# Patient Record
Sex: Female | Born: 1950 | Race: White | Hispanic: No | Marital: Married | Smoking: Never smoker
Health system: Southern US, Community
[De-identification: ages and names within clinical notes are randomized; demographics above are authoritative.]

## PROBLEM LIST (undated history)

## (undated) DIAGNOSIS — Z98811 Dental restoration status: Secondary | ICD-10-CM

## (undated) DIAGNOSIS — K259 Gastric ulcer, unspecified as acute or chronic, without hemorrhage or perforation: Secondary | ICD-10-CM

## (undated) DIAGNOSIS — N6092 Unspecified benign mammary dysplasia of left breast: Secondary | ICD-10-CM

## (undated) DIAGNOSIS — Z972 Presence of dental prosthetic device (complete) (partial): Secondary | ICD-10-CM

## (undated) DIAGNOSIS — Z8679 Personal history of other diseases of the circulatory system: Secondary | ICD-10-CM

## (undated) DIAGNOSIS — E039 Hypothyroidism, unspecified: Secondary | ICD-10-CM

## (undated) DIAGNOSIS — K2270 Barrett's esophagus without dysplasia: Secondary | ICD-10-CM

## (undated) DIAGNOSIS — Z923 Personal history of irradiation: Secondary | ICD-10-CM

## (undated) DIAGNOSIS — C50911 Malignant neoplasm of unspecified site of right female breast: Secondary | ICD-10-CM

## (undated) DIAGNOSIS — Z9889 Other specified postprocedural states: Secondary | ICD-10-CM

## (undated) DIAGNOSIS — R112 Nausea with vomiting, unspecified: Secondary | ICD-10-CM

## (undated) DIAGNOSIS — G250 Essential tremor: Secondary | ICD-10-CM

## (undated) DIAGNOSIS — K219 Gastro-esophageal reflux disease without esophagitis: Secondary | ICD-10-CM

## (undated) DIAGNOSIS — M810 Age-related osteoporosis without current pathological fracture: Secondary | ICD-10-CM

## (undated) DIAGNOSIS — T39395A Adverse effect of other nonsteroidal anti-inflammatory drugs [NSAID], initial encounter: Secondary | ICD-10-CM

## (undated) DIAGNOSIS — Z86718 Personal history of other venous thrombosis and embolism: Secondary | ICD-10-CM

## (undated) DIAGNOSIS — E785 Hyperlipidemia, unspecified: Secondary | ICD-10-CM

## (undated) DIAGNOSIS — I499 Cardiac arrhythmia, unspecified: Secondary | ICD-10-CM

## (undated) HISTORY — DX: Gastro-esophageal reflux disease without esophagitis: K21.9

## (undated) HISTORY — DX: Age-related osteoporosis without current pathological fracture: M81.0

## (undated) HISTORY — DX: Hyperlipidemia, unspecified: E78.5

## (undated) HISTORY — DX: Personal history of other diseases of the circulatory system: Z86.79

## (undated) HISTORY — DX: Barrett's esophagus without dysplasia: K22.70

## (undated) HISTORY — PX: COLONOSCOPY: SHX174

---

## 1989-07-18 HISTORY — PX: BREAST BIOPSY: SHX20

## 1989-07-18 HISTORY — PX: BREAST LUMPECTOMY: SHX2

## 2004-01-08 ENCOUNTER — Encounter: Admission: RE | Admit: 2004-01-08 | Discharge: 2004-01-08 | Payer: Self-pay | Admitting: Obstetrics and Gynecology

## 2005-01-11 ENCOUNTER — Encounter: Admission: RE | Admit: 2005-01-11 | Discharge: 2005-01-11 | Payer: Self-pay | Admitting: Obstetrics and Gynecology

## 2006-01-13 ENCOUNTER — Encounter: Admission: RE | Admit: 2006-01-13 | Discharge: 2006-01-13 | Payer: Self-pay | Admitting: Obstetrics and Gynecology

## 2006-08-18 ENCOUNTER — Ambulatory Visit: Payer: Self-pay | Admitting: Internal Medicine

## 2006-08-18 LAB — CONVERTED CEMR LAB
ALT: 30 units/L (ref 0–40)
BUN: 14 mg/dL (ref 6–23)
Basophils Relative: 0.6 % (ref 0.0–1.0)
Bilirubin, Direct: 0.1 mg/dL (ref 0.0–0.3)
CO2: 30 meq/L (ref 19–32)
Calcium, Total (PTH): 9.8 mg/dL (ref 8.4–10.5)
Cholesterol: 203 mg/dL (ref 0–200)
Direct LDL: 138.9 mg/dL
Eosinophils Relative: 2.7 % (ref 0.0–5.0)
GFR calc Af Amer: 95 mL/min
Hemoglobin: 13.6 g/dL (ref 12.0–15.0)
MCHC: 34.3 g/dL (ref 30.0–36.0)
Monocytes Absolute: 0.5 10*3/uL (ref 0.2–0.7)
Neutro Abs: 4.8 10*3/uL (ref 1.4–7.7)
PTH: 15.2 pg/mL (ref 14.0–72.0)
Potassium: 4.8 meq/L (ref 3.5–5.1)
RBC: 4.43 M/uL (ref 3.87–5.11)
RDW: 12.4 % (ref 11.5–14.6)
Sodium: 142 meq/L (ref 135–145)
TSH: 0.48 microintl units/mL (ref 0.35–5.50)
Total Bilirubin: 0.5 mg/dL (ref 0.3–1.2)
Triglycerides: 146 mg/dL (ref 0–149)

## 2006-09-01 ENCOUNTER — Ambulatory Visit: Payer: Self-pay | Admitting: Internal Medicine

## 2006-11-08 ENCOUNTER — Ambulatory Visit: Payer: Self-pay | Admitting: Internal Medicine

## 2006-11-08 LAB — CONVERTED CEMR LAB
Glucose, Bld: 103 mg/dL — ABNORMAL HIGH (ref 70–99)
HDL: 47.4 mg/dL (ref >39.0)
LDL Cholesterol: 72 mg/dL (ref 0–99)
Triglycerides: 143 mg/dL (ref 0–149)
VLDL: 29 mg/dL (ref 0–40)

## 2006-11-15 ENCOUNTER — Ambulatory Visit: Payer: Self-pay | Admitting: Internal Medicine

## 2007-04-24 DIAGNOSIS — E785 Hyperlipidemia, unspecified: Secondary | ICD-10-CM | POA: Insufficient documentation

## 2007-04-24 DIAGNOSIS — M899 Disorder of bone, unspecified: Secondary | ICD-10-CM | POA: Insufficient documentation

## 2007-04-24 DIAGNOSIS — M949 Disorder of cartilage, unspecified: Secondary | ICD-10-CM

## 2007-04-24 DIAGNOSIS — K219 Gastro-esophageal reflux disease without esophagitis: Secondary | ICD-10-CM | POA: Insufficient documentation

## 2007-04-24 DIAGNOSIS — Z86718 Personal history of other venous thrombosis and embolism: Secondary | ICD-10-CM

## 2007-05-08 ENCOUNTER — Encounter: Admission: RE | Admit: 2007-05-08 | Discharge: 2007-05-08 | Payer: Self-pay | Admitting: Obstetrics

## 2007-07-19 DIAGNOSIS — K2270 Barrett's esophagus without dysplasia: Secondary | ICD-10-CM

## 2007-07-19 HISTORY — DX: Barrett's esophagus without dysplasia: K22.70

## 2007-07-19 HISTORY — PX: COLOSTOMY: SHX63

## 2007-11-20 ENCOUNTER — Encounter: Payer: Self-pay | Admitting: Internal Medicine

## 2008-04-02 ENCOUNTER — Telehealth: Payer: Self-pay | Admitting: *Deleted

## 2008-05-09 ENCOUNTER — Encounter: Admission: RE | Admit: 2008-05-09 | Discharge: 2008-05-09 | Payer: Self-pay | Admitting: Internal Medicine

## 2008-05-09 ENCOUNTER — Telehealth: Payer: Self-pay | Admitting: *Deleted

## 2008-05-09 LAB — HM MAMMOGRAPHY

## 2008-05-12 ENCOUNTER — Ambulatory Visit: Payer: Self-pay | Admitting: Internal Medicine

## 2008-05-12 DIAGNOSIS — E039 Hypothyroidism, unspecified: Secondary | ICD-10-CM | POA: Insufficient documentation

## 2008-05-12 DIAGNOSIS — T50995A Adverse effect of other drugs, medicaments and biological substances, initial encounter: Secondary | ICD-10-CM | POA: Insufficient documentation

## 2008-05-13 ENCOUNTER — Ambulatory Visit: Admission: RE | Admit: 2008-05-13 | Discharge: 2008-05-13 | Payer: Self-pay | Admitting: Internal Medicine

## 2008-05-13 ENCOUNTER — Ambulatory Visit: Payer: Self-pay | Admitting: Vascular Surgery

## 2008-05-13 ENCOUNTER — Encounter: Payer: Self-pay | Admitting: Internal Medicine

## 2008-05-21 ENCOUNTER — Encounter: Payer: Self-pay | Admitting: Internal Medicine

## 2008-05-29 ENCOUNTER — Encounter: Payer: Self-pay | Admitting: Internal Medicine

## 2008-06-18 ENCOUNTER — Telehealth: Payer: Self-pay | Admitting: Internal Medicine

## 2008-06-21 DIAGNOSIS — K2970 Gastritis, unspecified, without bleeding: Secondary | ICD-10-CM

## 2008-06-21 DIAGNOSIS — K209 Esophagitis, unspecified without bleeding: Secondary | ICD-10-CM | POA: Insufficient documentation

## 2008-06-21 DIAGNOSIS — K2990 Gastroduodenitis, unspecified, without bleeding: Secondary | ICD-10-CM

## 2008-08-15 ENCOUNTER — Telehealth: Payer: Self-pay | Admitting: *Deleted

## 2008-12-09 ENCOUNTER — Telehealth: Payer: Self-pay | Admitting: Internal Medicine

## 2008-12-10 ENCOUNTER — Ambulatory Visit: Payer: Self-pay | Admitting: Internal Medicine

## 2008-12-10 DIAGNOSIS — J019 Acute sinusitis, unspecified: Secondary | ICD-10-CM

## 2008-12-10 DIAGNOSIS — B009 Herpesviral infection, unspecified: Secondary | ICD-10-CM | POA: Insufficient documentation

## 2008-12-10 DIAGNOSIS — H6980 Other specified disorders of Eustachian tube, unspecified ear: Secondary | ICD-10-CM

## 2009-06-01 ENCOUNTER — Ambulatory Visit: Payer: Self-pay | Admitting: Internal Medicine

## 2009-06-01 DIAGNOSIS — R0789 Other chest pain: Secondary | ICD-10-CM | POA: Insufficient documentation

## 2009-06-01 DIAGNOSIS — R5381 Other malaise: Secondary | ICD-10-CM

## 2009-06-01 DIAGNOSIS — R5383 Other fatigue: Secondary | ICD-10-CM

## 2009-06-09 ENCOUNTER — Telehealth: Payer: Self-pay | Admitting: Internal Medicine

## 2009-06-09 ENCOUNTER — Ambulatory Visit: Payer: Self-pay | Admitting: Internal Medicine

## 2009-06-15 LAB — CONVERTED CEMR LAB
ALT: 31 units/L (ref 0–35)
Albumin: 4.1 g/dL (ref 3.5–5.2)
Basophils Absolute: 0 10*3/uL (ref 0.0–0.1)
Basophils Relative: 0.5 % (ref 0.0–3.0)
CO2: 24 meq/L (ref 19–32)
Chloride: 109 meq/L (ref 96–112)
Cholesterol: 196 mg/dL (ref 0–200)
Eosinophils Relative: 6.9 % — ABNORMAL HIGH (ref 0.0–5.0)
Glucose, Bld: 105 mg/dL — ABNORMAL HIGH (ref 70–99)
HCT: 37.7 % (ref 36.0–46.0)
HDL: 40.4 mg/dL (ref >39.00)
LDL Cholesterol: 131 mg/dL — ABNORMAL HIGH (ref 0–99)
MCHC: 34.1 g/dL (ref 30.0–36.0)
Monocytes Relative: 7.5 % (ref 3.0–12.0)
Potassium: 4.5 meq/L (ref 3.5–5.1)
RBC: 4.03 M/uL (ref 3.87–5.11)
Sodium: 142 meq/L (ref 135–145)
Total Bilirubin: 1.5 mg/dL — ABNORMAL HIGH (ref 0.3–1.2)
Triglycerides: 122 mg/dL (ref 0.0–149.0)
VLDL: 24.4 mg/dL (ref 0.0–40.0)

## 2009-06-30 ENCOUNTER — Encounter: Payer: Self-pay | Admitting: Internal Medicine

## 2009-08-11 ENCOUNTER — Encounter: Admission: RE | Admit: 2009-08-11 | Discharge: 2009-08-11 | Payer: Self-pay | Admitting: Obstetrics

## 2010-06-17 ENCOUNTER — Telehealth: Payer: Self-pay | Admitting: Internal Medicine

## 2010-06-17 ENCOUNTER — Encounter: Payer: Self-pay | Admitting: Internal Medicine

## 2010-06-24 ENCOUNTER — Ambulatory Visit: Payer: Self-pay | Admitting: Internal Medicine

## 2010-06-24 ENCOUNTER — Encounter: Payer: Self-pay | Admitting: Internal Medicine

## 2010-06-24 DIAGNOSIS — R634 Abnormal weight loss: Secondary | ICD-10-CM

## 2010-06-24 DIAGNOSIS — L570 Actinic keratosis: Secondary | ICD-10-CM | POA: Insufficient documentation

## 2010-06-24 DIAGNOSIS — R9431 Abnormal electrocardiogram [ECG] [EKG]: Secondary | ICD-10-CM

## 2010-06-24 DIAGNOSIS — D485 Neoplasm of uncertain behavior of skin: Secondary | ICD-10-CM

## 2010-06-28 ENCOUNTER — Encounter: Payer: Self-pay | Admitting: *Deleted

## 2010-06-28 ENCOUNTER — Encounter: Payer: Self-pay | Admitting: Internal Medicine

## 2010-06-28 LAB — CONVERTED CEMR LAB
ALT: 18 units/L (ref 0–35)
AST: 21 units/L (ref 0–37)
Albumin: 4.5 g/dL (ref 3.5–5.2)
Alkaline Phosphatase: 52 units/L (ref 39–117)
Basophils Absolute: 0 10*3/uL (ref 0.0–0.1)
Basophils Relative: 0.9 % (ref 0.0–3.0)
Bilirubin, Direct: 0.1 mg/dL (ref 0.0–0.3)
CO2: 27 meq/L (ref 19–32)
Calcium: 9.6 mg/dL (ref 8.4–10.5)
Folate: 17.1 ng/mL
HCT: 39.5 % (ref 36.0–46.0)
HDL: 51.2 mg/dL (ref >39.00)
Lymphs Abs: 1.4 10*3/uL (ref 0.7–4.0)
Monocytes Relative: 7.8 % (ref 3.0–12.0)
Neutrophils Relative %: 57.2 % (ref 43.0–77.0)
RDW: 13 % (ref 11.5–14.6)
Sodium: 141 meq/L (ref 135–145)
Total CHOL/HDL Ratio: 4
Total Protein: 7.2 g/dL (ref 6.0–8.3)
VLDL: 16.2 mg/dL (ref 0.0–40.0)
WBC: 4.6 10*3/uL (ref 4.5–10.5)

## 2010-07-16 ENCOUNTER — Ambulatory Visit: Payer: Self-pay | Admitting: Internal Medicine

## 2010-07-16 ENCOUNTER — Encounter: Payer: Self-pay | Admitting: Internal Medicine

## 2010-07-16 DIAGNOSIS — R0989 Other specified symptoms and signs involving the circulatory and respiratory systems: Secondary | ICD-10-CM | POA: Insufficient documentation

## 2010-07-29 ENCOUNTER — Telehealth (INDEPENDENT_AMBULATORY_CARE_PROVIDER_SITE_OTHER): Payer: Self-pay | Admitting: *Deleted

## 2010-07-30 ENCOUNTER — Ambulatory Visit: Admission: RE | Admit: 2010-07-30 | Discharge: 2010-07-30 | Payer: Self-pay | Source: Home / Self Care

## 2010-07-30 ENCOUNTER — Encounter: Payer: Self-pay | Admitting: Internal Medicine

## 2010-07-30 ENCOUNTER — Other Ambulatory Visit: Payer: Self-pay | Admitting: Internal Medicine

## 2010-07-30 ENCOUNTER — Ambulatory Visit (HOSPITAL_COMMUNITY)
Admission: RE | Admit: 2010-07-30 | Discharge: 2010-07-30 | Payer: Self-pay | Source: Home / Self Care | Attending: Internal Medicine | Admitting: Internal Medicine

## 2010-08-05 ENCOUNTER — Telehealth: Payer: Self-pay | Admitting: Internal Medicine

## 2010-08-06 ENCOUNTER — Telehealth: Payer: Self-pay | Admitting: Internal Medicine

## 2010-08-15 LAB — CONVERTED CEMR LAB
Albumin: 4.2 g/dL (ref 3.5–5.2)
Alkaline Phosphatase: 44 units/L (ref 39–117)
Basophils Absolute: 0.1 10*3/uL (ref 0.0–0.1)
Bilirubin, Direct: 0.1 mg/dL (ref 0.0–0.3)
CO2: 27 meq/L (ref 19–32)
Cholesterol: 206 mg/dL (ref 0–200)
Creatinine, Ser: 0.8 mg/dL (ref 0.4–1.2)
GFR calc Af Amer: 95 mL/min
Hemoglobin: 13.2 g/dL (ref 12.0–15.0)
MCHC: 34.1 g/dL (ref 30.0–36.0)
MCV: 91.1 fL (ref 78.0–100.0)
Monocytes Absolute: 0.4 10*3/uL (ref 0.1–1.0)
Monocytes Relative: 7.6 % (ref 3.0–12.0)
Neutro Abs: 2.6 10*3/uL (ref 1.4–7.7)
Neutrophils Relative %: 50.7 % (ref 43.0–77.0)
Platelets: 269 10*3/uL (ref 150–400)
Potassium: 4.2 meq/L (ref 3.5–5.1)
RBC: 4.24 M/uL (ref 3.87–5.11)
Sodium: 144 meq/L (ref 135–145)
TSH: 0.65 microintl units/mL (ref 0.35–5.50)
VLDL: 25 mg/dL (ref 0–40)

## 2010-08-18 NOTE — Letter (Signed)
Summary: [facility]   Imported By: Maryln Gottron 08/15/2008 12:38:47  _____________________________________________________________________  External Attachment:    Type:   Image     Comment:   External Document

## 2010-08-18 NOTE — Progress Notes (Signed)
Summary: abnormal ekg  Phone Note Call from Patient   Caller: Patient Call For: Donna Headings MD Summary of Call: [phone] Pt had endoscopy yesterday and was told her EKG was abnormal, and needed to see her primary care MD.  Pt would like an appt for this problem and also for her labs and CPX.  Ekg is being faxed to Dr. Fabian Sharp, and pt has a copy of it.   Please advise when she should be seen. Initial call taken by: Lynann Beaver CMA AAMA,  June 17, 2010 4:18 PM  Follow-up for Phone Call        called pt to let her know we got her call - will let Dr. Ivin Poot know ekg was abnormal during procedure and that she was placed on meds for ulcer - carafate and a ?PPI , also requesting labs be done before cpx ?or at appt? , worried that the carafate will increase bloos sugars -(side effect listed) . Please advise  next wk what to do.  Follow-up by: Duard Brady LPN,  June 18, 2010 5:50 PM  Additional Follow-up for Phone Call Additional follow up Details #1::        I dont see ekg yet  ... please check to see  if  here.   Suggest ov to discuss  all of the above when    EKG is available to decide further  evaluation Additional Follow-up by: Donna Headings MD,  June 21, 2010 6:21 PM    Additional Follow-up for Phone Call Additional follow up Details #2::    LMTOCB Follow-up by: Romualdo Bolk, CMA Duncan Dull),  June 22, 2010 4:29 PM  Additional Follow-up for Phone Call Additional follow up Details #3:: Details for Additional Follow-up Action Taken: Pt aware and is going to bring in a copy of the EKG in then schedule a follow up appt. Romualdo Bolk, CMA (AAMA)  June 23, 2010 10:57 AM   ekg show nonspecific T waves.  has appt for tomorrow. Donna Headings MD  June 23, 2010 5:43 PM

## 2010-08-18 NOTE — Consult Note (Signed)
Summary: [facility]   Imported By: Maryln Gottron 12/04/2009 11:15:56  _____________________________________________________________________  External Attachment:    Type:   Image     Comment:   External Document

## 2010-08-19 NOTE — Assessment & Plan Note (Signed)
Summary: np6/abn ekg/jml   Visit Type:  Initial Consult Primary Provider:  Madelin Headings MD  CC:  Abnormal EKG.  History of Present Illness: Patient is a 59 year od who was referred for evaluation of chest pain and an abnormal EKG Patient has no history of CAD.   She has had episodes of chest pain that occur with and without acitivty. Episodes last seconds.  None recently.  She also notices more fatigue recently.  Some mild wheezing in November.  Current Medications (verified): 1)  Centrum  Tabs (Multiple Vitamins-Minerals) .... Take 1 Tablet By Mouth Once A Day 2)  Fish Oil 500 Mg Caps (Omega-3 Fatty Acids) .... Take 1 Capsule By Mouth Two Times A Day 3)  Omeprazole 40 Mg Cpdr (Omeprazole) .... Take 1 Capsule By Mouth Once A Day 4)  Synthroid 100 Mcg Tabs (Levothyroxine Sodium) .... Take 1 Tablet By Mouth Once A Week 5)  Synthroid 88 Mcg Tabs (Levothyroxine Sodium) .Marland Kitchen.. 1 Tablet By Mouth Once A Day or As Directed ( 6 Days A Week) 6)  Zovirax 5 % Crea (Acyclovir) .... Apply To Col Sore Every 2-3 Hours At Onset or As Directed 7)  Carafate 1 Gm/48ml Susp (Sucralfate) .... 2 Teasp. Two Times A Day  Allergies: 1)  ! Iodine (Iodine) 2)  ! * Latex  Past History:  Past Medical History: Last updated: 06/01/2009 G4P5  DVT, hx of Hyperlipidemia Osteopenia Blood in Stool GERD  Barretts on egd 2009 Allergies Phlebitis  ? DVT when pregnant  Thyroid Problem [facility]  gyne  fogelman     Past Surgical History: Last updated: 05/12/2008 Breast Bx benighn  Family History: Last updated: 07/16/2010 Family History of Arthritis Family History Diabetes 1st degree relative Family History High cholesterol Family History Hypertension Family History of Stroke M 1st degree relative <50 Family History Thyroid disease sister  Family History of Neurological disorder Mother died of CHF  Age 26 Father died of Parkinsons 80.  Social History: Last updated: 05/12/2008 Married Never  Smoked Alcohol use-no Drug use-no Regular exercise-no had been  HH of 2  [place] to [employer]  Family History: Family History of Arthritis Family History Diabetes 1st degree relative Family History High cholesterol Family History Hypertension Family History of Stroke M 1st degree relative <50 Family History Thyroid disease sister  Family History of Neurological disorder Mother died of CHF  Age 76 Father died of Parkinsons 36.  Review of Systems       All systems reviewed.  Neg to the above problem except as noted above.  Note LDL was 146, HDL was 51.  Vital Signs:  Patient profile:   60 year old female Menstrual status:  postmenopausal Height:      67.5 inches Weight:      186.25 pounds BMI:     28.84 Pulse rate:   72 / minute Pulse rhythm:   regular Resp:     18 per minute BP sitting:   118 / 80  (left arm) Cuff size:   large  Vitals Entered By: Vikki Ports (July 16, 2010 10:59 AM)  Physical Exam  Additional Exam:  Patient is in NAD HEENT:  Normocephalic, atraumatic. EOMI, PERRLA.  Neck: JVP is normal. No thyromegaly. Question bruit. R Lungs: clear to auscultation. No rales no wheezes.  Heart: Regular rate and rhythm. Normal S1, S2. No S3.   No significant murmurs. PMI not displaced.  Abdomen:  Supple, nontender. Normal bowel sounds. No masses. No hepatomegaly.  Extremities:  Good distal pulses throughout. No lower extremity edema.  Musculoskeletal :moving all extremities.  Neuro:   alert and oriented x3.    EKG  Procedure date:  07/16/2010  Findings:      NSR.  72 bpm.   T wave inv V1-V4, III.  Sl sagging of ST seg inferolaterally.  Impression & Recommendations:  Problem # 1:  NONSPECIFIC ABNORMAL ELECTROCARDIOGRAM (ICD-794.31) Patient's EKG findings are not specific.  I am not convinced Cp is angina.  More concering is fatiguability.   I would recommend stress echo to evaluate.  Problem # 2:  CAROTID BRUIT (ICD-785.9)  Orders: Stress Echo (Stress  Echo) Carotid Duplex (Carotid Duplex)  Problem # 3:  HYPERLIPIDEMIA (ICD-272.4) Counselled.  Will review results to determine aggressiveness of control.  Patient Instructions: 1)  Your physician recommends that you schedule a follow-up appointment in: we will call you with results 2)  Your physician has requested that you have a carotid duplex. This test is an ultrasound of the carotid arteries in your neck. It looks at blood flow through these arteries that supply the brain with blood. Allow one hour for this exam. There are no restrictions or special instructions. 3)  Your physician has requested that you have a stress echocardiogram. For further information please visit [email or web address]  Please follow instruction sheet as given.

## 2010-08-19 NOTE — Progress Notes (Signed)
Summary: pt rtn your call  Phone Note Call from Patient Call back at Home Phone [phone]   Caller: Patient Reason for Call: Talk to Nurse, Talk to Doctor Summary of Call: pt rtn your call Initial call taken by: Omer Jack,  August 06, 2010 3:37 PM  Follow-up for Phone Call        Called patient with stress echo results and carotid ultrasound.  Layne Benton, RN, BSN  August 06, 2010 3:47 PM

## 2010-08-19 NOTE — Assessment & Plan Note (Signed)
Summary: go over test results//ccm   Vital Signs:  Patient profile:   60 year old female Menstrual status:  postmenopausal Height:      67.5 inches Weight:      188 pounds BMI:     29.12 Pulse rate:   78 / minute BP sitting:   120 / 80  (right arm) Cuff size:   regular  Vitals Entered By: Romualdo Bolk, CMA (AAMA) (June 24, 2010 10:12 AM) CC: Follow-up visit on EKG   History of Present Illness: Donna Manning comes in today  for above .     She  was due for endoscopy.  and had ekg and was tole it was abnormal and to see cardiology. She was told she had an ulcer  and  also her barretts.  She was put  on prilosec  40  and carafate.   Has ?s about this .   She denies new DOB  does have intermittent sharp cp as described before felt to be esophageal . No unusal change in health otherwise . She does want me to check a rough spot on her upper back there for a while. NO bleeding? some itching .   Preventive Screening-Counseling & Management  Alcohol-Tobacco     Alcohol drinks/day: 0     Smoking Status: never  Caffeine-Diet-Exercise     Caffeine use/day: 1     Does Patient Exercise: yes     Type of exercise: walking  Current Medications (verified): 1)  Centrum  Tabs (Multiple Vitamins-Minerals) .... Take 2)  Fish Oil 500 Mg Caps (Omega-3 Fatty Acids) .... Take 3)  Omeprazole 10 Mg Cpdr (Omeprazole) 4)  Synthroid 100 Mcg Tabs (Levothyroxine Sodium) .... Take 1 Tablet By Mouth Once A Week 5)  Synthroid 88 Mcg Tabs (Levothyroxine Sodium) .Marland Kitchen.. 1 Tablet By Mouth Once A Day or As Directed ( 6 Days A Week) 6)  Calcium Carbonate-Vitamin D 600-400 Mg-Unit  Tabs (Calcium Carbonate-Vitamin D) 7)  Flonase 50 Mcg/act Susp (Fluticasone Propionate) .... 2 Spray Each Nares Q D 8)  Zovirax 5 % Oint (Acyclovir) 9)  Acyclovir 400 Mg Tabs (Acyclovir) .Marland Kitchen.. 1 By Mouth Three Times A Day As Directed As Needed  Allergies (verified): 1)  ! Iodine (Iodine)  Past History:  Past medical,  surgical, family and social histories (including risk factors) reviewed, and no changes noted (except as noted below).  Past Medical History: Reviewed history from 06/01/2009 and no changes required. G4P5  DVT, hx of Hyperlipidemia Osteopenia Blood in Stool GERD  Barretts on egd 2009 Allergies Phlebitis  ? DVT when pregnant  Thyroid Problem [employer]  gyne  fogelman     Past Surgical History: Reviewed history from 05/12/2008 and no changes required. Breast Bx benighn  Past History:  Care Management: Endocrinology: Noe Gens in [facility] Gynecology: Ernestina Penna Gastroenterology: Noe Gens  Family History: Reviewed history from 05/12/2008 and no changes required. Family History of Arthritis Family History Diabetes 1st degree relative Family History High cholesterol Family History Hypertension Family History of Stroke M 1st degree relative <50 Family History Thyroid disease sister  Family History of Neurological disorder  Social History: Reviewed history from 05/12/2008 and no changes required. Married Never Smoked Alcohol use-no Drug use-no Regular exercise-no had been  HH of 2  [place] to [employer]  Review of Systems  The patient denies anorexia, fever, hoarseness, syncope, dyspnea on exertion, peripheral edema, difficulty walking, hemoptysis, melena, hematochezia, muscle weakness, transient blindness, unusual weight change, abnormal bleeding, and enlarged lymph nodes.  Physical Exam  General:  Well-developed,well-nourished,in no acute distress; alert,appropriate and cooperative throughout examination Head:  normocephalic and atraumatic.   Eyes:  clear  eoms nl  Ears:  R ear normal, L ear normal, and no external deformities.   Mouth:  pharynx pink and moist.   Neck:  No deformities, masses, or tenderness noted. Breasts:  No mass, nodules, thickening, tenderness, bulging, retraction, inflamation, nipple discharge or skin changes noted.   Lungs:  Normal respiratory effort,  chest expands symmetrically. Lungs are clear to auscultation, no crackles or wheezes.no dullness.   Heart:  Normal rate and regular rhythm. S1 and S2 normal without gallop, murmur, click, rub or other extra sounds.no lifts.   Abdomen:  Bowel sounds positive,abdomen soft and non-tender without masses, organomegaly or hernias noted. Pulses:  nl cap refill  Extremities:  no clubbing cyanosis or edema  Neurologic:  alert & oriented X3 and gait normal.  non focal  Skin:  turgor normal, color normal, no ecchymoses, no petechiae, and no purpura.  fair skin..  sun changes and fredkling    a number of scaly  red patche and one papule upper back   2-3 mm  wart right  trunk  2 mm  Cervical Nodes:  No lymphadenopathy noted Psych:  Oriented X3, good eye contact, not anxious appearing, and not depressed appearing.     Impression & Recommendations:  Problem # 1:  NONSPECIFIC ABNORMAL ELECTROCARDIOGRAM (ICD-794.31)  in comparison of EKGs shows nonspecific P-wave changes in flattening throughout a bit more pronounced than her previous EKG otherwise normal sinus rhythm.  No typical features however because of her age risk factors and changes discussed getting cardiology evaluation and consult and testing as appropriate. Patient agrees to this.  Orders: Cardiology Referral (Cardiology)  Problem # 2:  WEIGHT LOSS (ICD-783.21) this is been more recent not severe possibly related to esophageal problem and also her but no other systemic symptoms. She has not had her blood work done in the past year will check today. 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 Take 1 tablet by mouth once a week    Synthroid 88 Mcg Tabs (Levothyroxine sodium) .Marland Kitchen... 1 tablet by mouth once a day or as directed ( 6 days a week)  Orders: TLB-TSH (Thyroid Stimulating Hormone) (84443-TSH)  Problem # 6:  BARRETTS ESOPHAGUS EGD 2009 (ICD-530.85) apparent  gastric ulcer also   Problem # 7:  OSTEOPENIA (ICD-733.90) disc use of acid blocler and bone health but at this point  should do what gi rec if has  ulcer disease.   Needs to heal.   Her updated medication list for this problem includes:    Calcium Carbonate-vitamin D 600-400 Mg-unit Tabs (Calcium carbonate-vitamin d)  Orders: T-Vitamin D (25-Hydroxy) (63016-01093)  Problem # 8:  Gi ulcer   presumed  gastric    by hx  and encourage rxa nd .u .  counseled in this area   Problem # 9:  SKIN LESION, UNCERTAIN SIGNIFICANCE (ICD-238.2)   vs ak   disc options    she will see derm .   Problem # 10:  SOLAR KERATOSIS (ICD-702.0)  Complete Medication List: 1)  Centrum Tabs (Multiple vitamins-minerals) .... Take 2)  Fish Oil 500 Mg Caps (Omega-3 fatty acids) .... Take 3)  Omeprazole 10 Mg Cpdr (Omeprazole) 4)  Synthroid 100 Mcg Tabs (Levothyroxine sodium) .... Take 1 tablet by mouth once a week 5)  Synthroid 88 Mcg Tabs (Levothyroxine sodium) .Marland Kitchen.. 1 tablet by mouth once a day or as directed ( 6 days a week) 6)  Calcium Carbonate-vitamin D 600-400 Mg-unit Tabs (Calcium carbonate-vitamin d) 7)  Flonase 50 Mcg/act Susp (Fluticasone propionate) .... 2 spray each nares q d 8)  Zovirax 5 % Oint (Acyclovir) 9)  Acyclovir 400 Mg  Tabs (Acyclovir) .Marland Kitchen.. 1 by mouth three times a day as directed as needed 10)  Zovirax 5 % Crea (Acyclovir) .... Apply to col sore every 2-3 hours at onset or as directed  Other Orders: TLB-Lipid Panel (80061-LIPID)  Patient Instructions: 1)  will contact you about cardiology appt . 2)  You will be informed of lab results when available.  3)  follow up depending on results  4)  see derm about the skin poss precancer.   Prescriptions: ZOVIRAX 5 % CREA (ACYCLOVIR) apply to col sore every 2-3 hours at onset or as directed  #1 tube x 3   Entered and Authorized by:   Madelin Headings MD   Signed by:   Madelin Headings MD on 06/24/2010   Method used:   Electronically to        [facility]* (retail)       [address] [place], [place]  [address]       Ph: [phone]       Fax: [phone]   RxID:   [id number]    Orders Added: 1)  TLB-TSH (Thyroid Stimulating Hormone) [84443-TSH] 2)  TLB-Hepatic/Liver Function Pnl [80076-HEPATIC] 3)  TLB-CBC Platelet - w/Differential [85025-CBCD] 4)  TLB-BMP (Basic Metabolic Panel-BMET) [80048-METABOL] 5)  TLB-Lipid Panel [80061-LIPID] 6)  T-Vitamin D (25-Hydroxy) [84132-44010] 7)  TLB-B12 + Folate Pnl [82746_82607-B12/FOL] 8)  TLB-T4 (Thyrox), Free [27253-GU4Q] 9)  T-CRP (C-Reactive Protein) [23860] 10)  Specimen Handling [99000] 11)  Venipuncture [36415] 12)  Est. Patient Level IV [03474] 13)  Cardiology Referral [Cardiology]  Appended Document: Orders Update    Clinical Lists Changes  Orders: Added new Service order of EKG w/ Interpretation (93000) - Signed

## 2010-08-19 NOTE — Progress Notes (Signed)
Summary: stress echo appt  Phone Note Outgoing Call Call back at [facility] Phone [phone]   Call placed by: Stanton Kidney, EMT-P,  July 29, 2010 1:13 PM Action Taken: Phone Call Completed Summary of Call: Left Message on machine reference stress echo appt. Stanton Kidney, EMT-P  July 29, 2010 1:14 PM

## 2010-08-19 NOTE — Progress Notes (Signed)
Summary: test results  Phone Note Call from Patient Call back at Home Phone [phone] Call back at cell [phone]   Reason for Call: Talk to Nurse, Lab or Test Results Summary of Call: pt calling re tests results. pt states she had three test done. Initial call taken by: Roe Coombs,  August 05, 2010 4:30 PM  Follow-up for Phone Call        See note on echo.  Stress echo is normal.  SOB and CP does not appear to be from her heart. Follow-up by: Sherrill Raring, MD, [facility],  August 06, 2010 12:46 PM     Appended Document: test results [facility] on home phone and cell phone  for call back.  Appended Document: test results Patient aware of above.

## 2010-08-19 NOTE — Letter (Signed)
Summary: Generic Letter  [place] [facility]  [address] [place], [place] [address]   Phone: [phone]  Fax: [phone]    06/28/2010  Kathline Magic [address] [place], [place]  [address]  Dear Ms. Corter,    TSH (TSH)   FastTSH                   0.55 uIU/mL                 0.35-5.50  Tests: (2) Hepatic/Liver Function Panel (HEPATIC)   Total Bilirubin           0.9 mg/dL                   5.7-8.4   Direct Bilirubin          0.1 mg/dL                   6.9-6.2   Alkaline Phosphatase      52 U/L                      39-117   AST                       21 U/L                      0-37   ALT                       18 U/L                      0-35   Total Protein             7.2 g/dL                    9.5-2.8   Albumin                   4.5 g/dL                    4.1-3.2  Tests: (3) CBC Platelet w/Diff (CBCD)   White Cell Count          4.6 K/uL                    4.5-10.5   Red Cell Count            4.32 Mil/uL                 3.87-5.11   Hemoglobin                13.3 g/dL                   44.0-10.2   Hematocrit                39.5 %                      36.0-46.0   MCV                       91.5 fl                     78.0-100.0   MCHC  33.7 g/dL                   16.1-09.6   RDW                       13.0 %                      11.5-14.6   Platelet Count            262.0 K/uL                  150.0-400.0   Neutrophil %              57.2 %                      43.0-77.0   Lymphocyte %              30.6 %                      12.0-46.0   Monocyte %                7.8 %                       3.0-12.0   Eosinophils%              3.5 %                       0.0-5.0   Basophils %               0.9 %                       0.0-3.0   Neutrophill Absolute      2.6 K/uL                    1.4-7.7   Lymphocyte Absolute       1.4 K/uL                    0.7-4.0   Monocyte Absolute         0.4 K/uL                    0.1-1.0  Eosinophils,  Absolute                             0.2 K/uL                    0.0-0.7   Basophils Absolute        0.0 K/uL                    0.0-0.1  Tests: (4) BMP (METABOL)   Sodium                    141 mEq/L                   135-145   Potassium                 4.2 mEq/L                   3.5-5.1   Chloride  108 mEq/L                   96-112   Carbon Dioxide            27 mEq/L                    19-32   Glucose              [H]  102 mg/dL                   53-66   BUN                       13 mg/dL                    4-40   Creatinine                0.9 mg/dL                   3.4-7.4   Calcium                   9.6 mg/dL                   2.5-95.6   GFR                       71.36 mL/min                >60.00  Tests: (5) Lipid Panel (LIPID)   Cholesterol          [H]  216 mg/dL                   3-875     ATP III Classification            Desirable:  < 200 mg/dL                    Borderline High:  200 - 239 mg/dL               High:  > = 240 mg/dL   Triglycerides             81.0 mg/dL                  6.4-332.9     Normal:  <150 mg/dL     Borderline High:  518 - 199 mg/dL   HDL                       84.16 mg/dL                 >60.63   VLDL Cholesterol          16.2 mg/dL                  0.1-60.1  CHO/HDL Ratio:  CHD Risk                             4                    Men          Women     1/2 Average Risk     3.4          3.3  Average Risk          5.0          4.4     2X Average Risk          9.6          7.1     3X Average Risk          15.0          11.0                           Tests: (6) B12 + Folate Panel (B12/FOL)   Vitamin B12               356 pg/mL                   211-911   Folate                    17.1 ng/mL     Deficient  0.4 - 3.4 ng/mL     Indeterminate  3.4 - 5.4 ng/mL     Normal  >5.4 ng/mL  Tests: (7) T4, Free (FT4R)   Free T4                   0.96 ng/dL                  0.60-1.60  Tests: (8) Cholesterol LDL - Direct (DIRLDL)   Cholesterol LDL - Direct                             145.8 mg/dL     Optimal:  <130 mg/dL     Near or Above Optimal:  100-129 mg/dL     Borderline High:  865-784 mg/dL     High:  696-295 mg/dL     Very High:  >284 mg/dL  Your labs are normal including the vitamin D except LDL. Lipids are slightlly elevated. If you have any questions, please give [place] a call at [phone].        Sincerely,   Tor Netters, CMA (AAMA)

## 2010-11-30 ENCOUNTER — Other Ambulatory Visit: Payer: Self-pay | Admitting: Internal Medicine

## 2010-12-03 NOTE — Assessment & Plan Note (Signed)
[facility]                                 ON-CALL NOTE   Donna Manning, Donna Manning                   MRN:          [id number]  DATE:05/31/2008                            DOB:          11-03-1950    The patient complained of yeast infection.  She said she had an  endoscopy and colonoscopy, and was on antibiotics recently.  She thinks  she has a vaginal yeast infection with itching, and burning in the  vaginal area.  She tried some Vagisil relief cream, but it did not work.  She is not having any discharge or abdominal pain.  She also said her  upper back has been in spasms, and she has been taking Tylenol, but was  told not to take antiinflammatories and wants to know what she could do  for that.  She denies severe pain, headaches, numbness, or weakness  anywhere.  I advised her to get some Monistat over-the-counter for the  yeast infection, and to try that over the weekend, and to call Dr.  Rosezella [place] office for followup if this does not improve her symptoms.  I  advised her to use some heat on her back and do some stretches.  I  offered to see her in the [facility], but she said she will wait  until Monday, and call back.     Marne A. Tower, MD  Electronically Signed    MAT/MedQ  DD: 05/31/2008  DT: 05/31/2008  Job #: [id number]

## 2010-12-03 NOTE — Assessment & Plan Note (Signed)
[facility] OFFICE NOTE   Donna Manning, Donna Manning                   MRN:          [id number]  DATE:08/18/2006                            DOB:          30-Dec-1950    NEW PATIENT VISIT:   CHIEF COMPLAINT:  New patient to establish a number of concerns.   HISTORY OF PRESENT ILLNESS:  Donna Manning is a 60 year old nonsmoking  white female recently from [place], moved to [place] about 3  years ago, who comes in today for a first-time visit.  She currently  does not have a primary care physician.  Has specialists, Dr. Annabell Manning,  GYN, and, I believe, has seen an endocrinologist in the past for her  thyroid.   PROBLEMS:  1. She has had a cough for about a week but she feels she is getting      better.  It felt like a head cold and now a chest cold.  There is      no associated shortness of breath, fever, or change in exercise      tolerance or pain.  2. She has a history of abnormal lipids that were last done, we      believe, in July 2006 by Dr. Annabell Manning and previous June 2005.  The      last one shows triglycerides 193, total cholesterol 232, HDL 46,      LDL 147, and a ratio of 5.0.  Her chemistries were normal with      fasting blood sugar of 99.  She does have a family history of late-      onset type 2 diabetes and stroke in her parents at a much older age      and no premature history of heart disease in her family that she is      aware of.  3. She had a history of osteoporosis/osteopenia diagnosed by DEXA      scan.  I do not have this to review today, but she states that in      the hip it was osteoporotic, the rest was osteopenia, and she has      been placed more recently on Actonel 35 mg one a week, and she      takes calcium and, I believe, some vitamin D.  She does have      concerns about taking the medication and is it really necessary?      there is no specific diagnosis of osteoporosis in the  family and      she has no history of adult-onset fractures.  She does not have any      side effects to the Actonel at present that we are aware of.  4. Continuous noise in her chest at times, not specifically related to      heartburn but related to burping perhaps.  This has been going on      for quite awhile.  She had an upper GI in the remote past that was      apparently normal.  She takes Pepcid 10 mg at night  one to two.  No      history of right upper quadrant pain, acute onset of vomiting, or      asthma.   PAST MEDICAL HISTORY:  1. See data base.  2. Chicken pox as a child.  3. Breast biopsy, I believe in 1990, with a lumpectomy for benign      problems.  4. Mild seasonal rhinitis, using OTC medications.  5. History of DVT in her legs in pregnancy.  6. Hypothyroidism diagnosed a couple of years ago, on replacement      therapy.  Her last TSH, she states, was May 2007 and we believe was      okay.  7. History of minor blood in the stool 10 years ago, which revealed a      negative colonoscopy.  8. GERD as above.   She is gravida 4, para 5.  Last Pap was 2007.  Last menstrual period age  12 or 66.  Last mammogram was in 2007.  Unsure of her last tetanus shot.   MEDICATIONS:  1. Synthroid 88 mcg 6 days a week, Synthroid 100 mcg 1 day a week.  2. Actonel 35 mg one p.o. each week.  3. Multivitamin.  4. Calcium 600 mg a day.  5. Pepcid 10 mg h.s.  6. Fish oil.   DRUG ALLERGIES:  IODINE causes a reaction.   FAMILY HISTORY:  Father died at age 9 of complications of stroke.  He  probably had elevated cholesterol and high blood pressure.  He had  Parkinson disease.  Mother has type 2 diabetes and everything but is  in her 33s.  She has 6 siblings, alive and well.  A sister has thyroid  disease.  An older brother about 94 recently diagnosed with what sounds  like peripheral vascular disease.   SOCIAL HISTORY:  See data base.  College graduate.  Household of 2.  Eight  hours of sleep.  Negative TAD.  Some caffeine use.  Was exercising  4 days a week in the fall but not recently.  Negative ETF.   REVIEW OF SYSTEMS:  Negative for chest pain, shortness of breath.  Cough  as above.  Skin, neurologic, psychiatric negative.  No unusual weight  loss or weight gain recently.   OBJECTIVE:  VITAL SIGNS:  Height 5 feet 8 inches, weight 200.  Pulse 72  and regular, blood pressure 130/70.  GENERAL:  This is a WDWN healthy-appearing middle-aged lady in no acute  distress, but she does have some obvious congestion and occasional  bronchial cough.  HEENT:  Generally unremarkable except for some mild congestion.  CHEST:  A rare wheeze that clears with coughing in the right base.  No  rales were noted.  NECK:  Without masses or nodules noted.  CARDIAC:  S1-S2, no gallops or murmurs.  Peripheral pulses are present.  No significant edema.  ABDOMEN:  Soft.  No organomegaly, guarding or rebound.  SKIN:  Nonicteric with no acute changes.   A review of her labs from 2005 and 2006 as above in chart.   IMPRESSION:  1. Cough.  Convalescent viral respiratory tract infection.  Did      discuss just symptomatic relief and to get back with [place] if there is      shortness of breath, fever or failure to resolve.  2. Hyperlipidemia with elevated triglycerides, although HDL is almost      at goal.  The risk ratio is up.  3. Hypothyroidism, on  replacement.  4. Osteoporosis, osteopenia, with concerns about taking medications.  5. Gastroesophageal reflux disease with possibly atypical symptoms, on      an H2 antagonist at night.  6. Family history of cardiovascular disease and type 2 diabetes,      albeit in much older adults.   PLAN:  Discussed risk, cardiovascular risk, need for more current  fasting lab work and more information to look at her risk.  We will  check her fasting labs, which will include her lipids, LFTs, a BMP.  We will also get a vitamin D, PTH and TSH level.   She will get a copy of  her DEXA scan.  She will follow up in 2-3 weeks or as needed.  She may  benefit from proton pump inhibitor to see if they would help her chest  symptoms.     Donna Mends. [name], MD  Electronically Signed    WKP/MedQ  DD: 08/19/2006  DT: 08/19/2006  Job #: [id number]

## 2011-05-27 ENCOUNTER — Other Ambulatory Visit: Payer: Self-pay | Admitting: Internal Medicine

## 2011-06-06 ENCOUNTER — Other Ambulatory Visit: Payer: Self-pay | Admitting: Obstetrics

## 2011-06-06 DIAGNOSIS — Z1231 Encounter for screening mammogram for malignant neoplasm of breast: Secondary | ICD-10-CM

## 2011-06-15 ENCOUNTER — Other Ambulatory Visit: Payer: Self-pay | Admitting: Internal Medicine

## 2011-06-30 ENCOUNTER — Ambulatory Visit
Admission: RE | Admit: 2011-06-30 | Discharge: 2011-06-30 | Disposition: A | Discharge status: Home / Self Care | Payer: BC Managed Care – PPO | Source: Ambulatory Visit | Attending: Obstetrics | Admitting: Obstetrics

## 2011-06-30 DIAGNOSIS — Z1231 Encounter for screening mammogram for malignant neoplasm of breast: Secondary | ICD-10-CM

## 2011-09-27 ENCOUNTER — Telehealth: Payer: Self-pay | Admitting: Internal Medicine

## 2011-09-27 MED ORDER — LEVOTHYROXINE SODIUM 100 MCG PO TABS: 100.0000 ug | ORAL_TABLET | Freq: Every day | ORAL | Status: DC

## 2011-09-27 MED ORDER — LEVOTHYROXINE SODIUM 88 MCG PO TABS: 88.0000 ug | ORAL_TABLET | Freq: Every day | ORAL | Status: DC

## 2011-09-27 NOTE — Telephone Encounter (Signed)
Pt is scheduled for a cpx in may but is out of the following medication levothyroxine (SYNTHROID, LEVOTHROID) 100 MCG tablet, levothyroxine (SYNTHROID, LEVOTHROID) 88 MCG tablet. Pt is requesting a 60 day refill  [employer]

## 2011-09-27 NOTE — Telephone Encounter (Signed)
Rx sent to pharmacy   

## 2011-11-23 ENCOUNTER — Other Ambulatory Visit (INDEPENDENT_AMBULATORY_CARE_PROVIDER_SITE_OTHER): Payer: BC Managed Care – PPO

## 2011-11-23 DIAGNOSIS — Z79899 Other long term (current) drug therapy: Secondary | ICD-10-CM

## 2011-11-23 DIAGNOSIS — Z0000 Encounter for general adult medical examination without abnormal findings: Secondary | ICD-10-CM

## 2011-11-23 LAB — CBC WITH DIFFERENTIAL/PLATELET
Basophils Absolute: 0 10*3/uL (ref 0.0–0.1)
Eosinophils Relative: 3 % (ref 0.0–5.0)
Hemoglobin: 12.8 g/dL (ref 12.0–15.0)
Lymphocytes Relative: 33.9 % (ref 12.0–46.0)
MCHC: 33.4 g/dL (ref 30.0–36.0)
MCV: 89.5 fl (ref 78.0–100.0)
Monocytes Absolute: 0.5 10*3/uL (ref 0.1–1.0)
Neutro Abs: 2.6 10*3/uL (ref 1.4–7.7)
Neutrophils Relative %: 52.4 % (ref 43.0–77.0)
Platelets: 249 10*3/uL (ref 150.0–400.0)
RBC: 4.28 Mil/uL (ref 3.87–5.11)
RDW: 13.2 % (ref 11.5–14.6)
WBC: 4.9 10*3/uL (ref 4.5–10.5)

## 2011-11-23 LAB — BASIC METABOLIC PANEL
CO2: 26 mEq/L (ref 19–32)
Calcium: 9.3 mg/dL (ref 8.4–10.5)
Chloride: 103 mEq/L (ref 96–112)
Creatinine, Ser: 0.8 mg/dL (ref 0.4–1.2)
GFR: 78.34 mL/min (ref >60.00)
Glucose, Bld: 99 mg/dL (ref 70–99)
Potassium: 3.9 mEq/L (ref 3.5–5.1)

## 2011-11-23 LAB — POCT URINALYSIS DIPSTICK
Ketones, UA: NEGATIVE
Nitrite, UA: NEGATIVE
pH, UA: 5.5

## 2011-11-23 LAB — HEPATIC FUNCTION PANEL
AST: 23 U/L (ref 0–37)
Albumin: 4.2 g/dL (ref 3.5–5.2)
Bilirubin, Direct: 0 mg/dL (ref 0.0–0.3)
Total Bilirubin: 0.5 mg/dL (ref 0.3–1.2)
Total Protein: 7.5 g/dL (ref 6.0–8.3)

## 2011-11-23 LAB — LIPID PANEL: Cholesterol: 215 mg/dL — ABNORMAL HIGH (ref 0–200)

## 2011-11-30 ENCOUNTER — Ambulatory Visit (INDEPENDENT_AMBULATORY_CARE_PROVIDER_SITE_OTHER): Payer: BC Managed Care – PPO | Admitting: Internal Medicine

## 2011-11-30 ENCOUNTER — Encounter: Payer: Self-pay | Admitting: Internal Medicine

## 2011-11-30 VITALS — BP 112/74 | HR 80 | Temp 98.5°F | Ht 67.5 in | Wt 198.0 lb

## 2011-11-30 DIAGNOSIS — Z23 Encounter for immunization: Secondary | ICD-10-CM

## 2011-11-30 DIAGNOSIS — K219 Gastro-esophageal reflux disease without esophagitis: Secondary | ICD-10-CM

## 2011-11-30 DIAGNOSIS — Z0000 Encounter for general adult medical examination without abnormal findings: Secondary | ICD-10-CM

## 2011-11-30 DIAGNOSIS — E781 Pure hyperglyceridemia: Secondary | ICD-10-CM

## 2011-11-30 DIAGNOSIS — M8580 Other specified disorders of bone density and structure, unspecified site: Secondary | ICD-10-CM

## 2011-11-30 DIAGNOSIS — M949 Disorder of cartilage, unspecified: Secondary | ICD-10-CM

## 2011-11-30 DIAGNOSIS — M899 Disorder of bone, unspecified: Secondary | ICD-10-CM

## 2011-11-30 DIAGNOSIS — E039 Hypothyroidism, unspecified: Secondary | ICD-10-CM

## 2011-11-30 DIAGNOSIS — K2270 Barrett's esophagus without dysplasia: Secondary | ICD-10-CM

## 2011-11-30 MED ORDER — LEVOTHYROXINE SODIUM 88 MCG PO TABS: 88.0000 ug | ORAL_TABLET | Freq: Every day | ORAL | Status: DC

## 2011-11-30 MED ORDER — LEVOTHYROXINE SODIUM 100 MCG PO TABS: 100.0000 ug | ORAL_TABLET | Freq: Every day | ORAL | Status: DC

## 2011-11-30 NOTE — Patient Instructions (Signed)
Continue healthy lifestyle as best as possible exercise as possible avoid simple sugars and animal fats.  Continue same dose of thyroid. Look into getting the shingles vaccine. We'll review your record and see if I can see anything about bone density. You should have it done at age 61 either way.  I reviewed your record and it appears that Dr. Tenny Craw did not think you had heart disease or carotid disease but recommended she do healthy lifestyle to get your cholesterol in a good range.     Hypertriglyceridemia  Diet for High blood levels of Triglycerides Most fats in food are triglycerides. Triglycerides in your blood are stored as fat in your body. High levels of triglycerides in your blood may put you at a greater risk for heart disease and stroke.  Normal triglyceride levels are less than 150 mg/dL. Borderline high levels are 150-199 mg/dl. High levels are 200 - 499 mg/dL, and very high triglyceride levels are greater than 500 mg/dL. The decision to treat high triglycerides is generally based on the level. For people with borderline or high triglyceride levels, treatment includes weight loss and exercise. Drugs are recommended for people with very high triglyceride levels. Many people who need treatment for high triglyceride levels have metabolic syndrome. This syndrome is a collection of disorders that often include: insulin resistance, high blood pressure, blood clotting problems, high cholesterol and triglycerides. TESTING PROCEDURE FOR TRIGLYCERIDES  You should not eat 4 hours before getting your triglycerides measured. The normal range of triglycerides is between 10 and 250 milligrams per deciliter (mg/dl). Some people may have extreme levels (1000 or above), but your triglyceride level may be too high if it is above 150 mg/dl, depending on what other risk factors you have for heart disease.   People with high blood triglycerides may also have high blood cholesterol levels. If you have high  blood cholesterol as well as high blood triglycerides, your risk for heart disease is probably greater than if you only had high triglycerides. High blood cholesterol is one of the main risk factors for heart disease.  CHANGING YOUR DIET  Your weight can affect your blood triglyceride level. If you are more than 20% above your ideal body weight, you may be able to lower your blood triglycerides by losing weight. Eating less and exercising regularly is the best way to combat this. Fat provides more calories than any other food. The best way to lose weight is to eat less fat. Only 30% of your total calories should come from fat. Less than 7% of your diet should come from saturated fat. A diet low in fat and saturated fat is the same as a diet to decrease blood cholesterol. By eating a diet lower in fat, you may lose weight, lower your blood cholesterol, and lower your blood triglyceride level.  Eating a diet low in fat, especially saturated fat, may also help you lower your blood triglyceride level. Ask your dietitian to help you figure how much fat you can eat based on the number of calories your caregiver has prescribed for you.  Exercise, in addition to helping with weight loss may also help lower triglyceride levels.   Alcohol can increase blood triglycerides. You may need to stop drinking alcoholic beverages.   Too much carbohydrate in your diet may also increase your blood triglycerides. Some complex carbohydrates are necessary in your diet. These may include bread, rice, potatoes, other starchy vegetables and cereals.   Reduce "simple" carbohydrates. These may include pure  sugars, candy, honey, and jelly without losing other nutrients. If you have the kind of high blood triglycerides that is affected by the amount of carbohydrates in your diet, you will need to eat less sugar and less high-sugar foods. Your caregiver can help you with this.   Adding 2-4 grams of fish oil (EPA+ DHA) may also help  lower triglycerides. Speak with your caregiver before adding any supplements to your regimen.  Following the Diet  Maintain your ideal weight. Your caregivers can help you with a diet. Generally, eating less food and getting more exercise will help you lose weight. Joining a weight control group may also help. Ask your caregivers for a good weight control group in your area.  Eat low-fat foods instead of high-fat foods. This can help you lose weight too.  These foods are lower in fat. Eat MORE of these:   Dried beans, peas, and lentils.   Egg whites.   Low-fat cottage cheese.   Fish.   Lean cuts of meat, such as round, sirloin, rump, and flank (cut extra fat off meat you fix).   Whole grain breads, cereals and pasta.   Skim and nonfat dry milk.   Low-fat yogurt.   Poultry without the skin.   Cheese made with skim or part-skim milk, such as mozzarella, parmesan, farmers', ricotta, or pot cheese.  These are higher fat foods. Eat LESS of these:   Whole milk and foods made from whole milk, such as American, blue, cheddar, monterey jack, and swiss cheese   High-fat meats, such as luncheon meats, sausages, knockwurst, bratwurst, hot dogs, ribs, corned beef, ground pork, and regular ground beef.   Fried foods.  Limit saturated fats in your diet. Substituting unsaturated fat for saturated fat may decrease your blood triglyceride level. You will need to read package labels to know which products contain saturated fats.  These foods are high in saturated fat. Eat LESS of these:   Fried pork skins.   Whole milk.   Skin and fat from poultry.   Palm oil.   Butter.   Shortening.   Cream cheese.   Tomasa Blase.   Margarines and baked goods made from listed oils.   Vegetable shortenings.   Chitterlings.   Fat from meats.   Coconut oil.   Palm kernel oil.   Lard.   Cream.   Sour cream.   Fatback.   Coffee whiteners and non-dairy creamers made with these oils.   Cheese  made from whole milk.  Use unsaturated fats (both polyunsaturated and monounsaturated) moderately. Remember, even though unsaturated fats are better than saturated fats; you still want a diet low in total fat.  These foods are high in unsaturated fat:   Canola oil.   Sunflower oil.   Mayonnaise.   Almonds.   Peanuts.   Pine nuts.   Margarines made with these oils.   Safflower oil.   Olive oil.   Avocados.   Cashews.   Peanut butter.   Sunflower seeds.   Soybean oil.   Peanut oil.   Olives.   Pecans.   Walnuts.   Pumpkin seeds.  Avoid sugar and other high-sugar foods. This will decrease carbohydrates without decreasing other nutrients. Sugar in your food goes rapidly to your blood. When there is excess sugar in your blood, your liver may use it to make more triglycerides. Sugar also contains calories without other important nutrients.  Eat LESS of these:   Sugar, brown sugar, powdered sugar, jam, jelly, preserves,  honey, syrup, molasses, pies, candy, cakes, cookies, frosting, pastries, colas, soft drinks, punches, fruit drinks, and regular gelatin.   Avoid alcohol. Alcohol, even more than sugar, may increase blood triglycerides. In addition, alcohol is high in calories and low in nutrients. Ask for sparkling water, or a diet soft drink instead of an alcoholic beverage.  Suggestions for planning and preparing meals   Bake, broil, grill or roast meats instead of frying.   Remove fat from meats and skin from poultry before cooking.   Add spices, herbs, lemon juice or vinegar to vegetables instead of salt, rich sauces or gravies.   Use a non-stick skillet without fat or use no-stick sprays.   Cool and refrigerate stews and broth. Then remove the hardened fat floating on the surface before serving.   Refrigerate meat drippings and skim off fat to make low-fat gravies.   Serve more fish.   Use less butter, margarine and other high-fat spreads on bread or  vegetables.   Use skim or reconstituted non-fat dry milk for cooking.   Cook with low-fat cheeses.   Substitute low-fat yogurt or cottage cheese for all or part of the sour cream in recipes for sauces, dips or congealed salads.   Use half yogurt/half mayonnaise in salad recipes.   Substitute evaporated skim milk for cream. Evaporated skim milk or reconstituted non-fat dry milk can be whipped and substituted for whipped cream in certain recipes.   Choose fresh fruits for dessert instead of high-fat foods such as pies or cakes. Fruits are naturally low in fat.  When Dining Out   Order low-fat appetizers such as fruit or vegetable juice, pasta with vegetables or tomato sauce.   Select clear, rather than cream soups.   Ask that dressings and gravies be served on the side. Then use less of them.   Order foods that are baked, broiled, poached, steamed, stir-fried, or roasted.   Ask for margarine instead of butter, and use only a small amount.   Drink sparkling water, unsweetened tea or coffee, or diet soft drinks instead of alcohol or other sweet beverages.  QUESTIONS AND ANSWERS ABOUT OTHER FATS IN THE BLOOD: SATURATED FAT, TRANS FAT, AND CHOLESTEROL What is trans fat? Trans fat is a type of fat that is formed when vegetable oil is hardened through a process called hydrogenation. This process helps makes foods more solid, gives them shape, and prolongs their shelf life. Trans fats are also called hydrogenated or partially hydrogenated oils.  What do saturated fat, trans fat, and cholesterol in foods have to do with heart disease? Saturated fat, trans fat, and cholesterol in the diet all raise the level of LDL "bad" cholesterol in the blood. The higher the LDL cholesterol, the greater the risk for coronary heart disease (CHD). Saturated fat and trans fat raise LDL similarly.  What foods contain saturated fat, trans fat, and cholesterol? High amounts of saturated fat are found in animal  products, such as fatty cuts of meat, chicken skin, and full-fat dairy products like butter, whole milk, cream, and cheese, and in tropical vegetable oils such as palm, palm kernel, and coconut oil. Trans fat is found in some of the same foods as saturated fat, such as vegetable shortening, some margarines (especially hard or stick margarine), crackers, cookies, baked goods, fried foods, salad dressings, and other processed foods made with partially hydrogenated vegetable oils. Small amounts of trans fat also occur naturally in some animal products, such as milk products, beef, and lamb. Foods high  in cholesterol include liver, other organ meats, egg yolks, shrimp, and full-fat dairy products. How can I use the new food label to make heart-healthy food choices? Check the Nutrition Facts panel of the food label. Choose foods lower in saturated fat, trans fat, and cholesterol. For saturated fat and cholesterol, you can also use the Percent Daily Value (%DV): 5% DV or less is low, and 20% DV or more is high. (There is no %DV for trans fat.) Use the Nutrition Facts panel to choose foods low in saturated fat and cholesterol, and if the trans fat is not listed, read the ingredients and limit products that list shortening or hydrogenated or partially hydrogenated vegetable oil, which tend to be high in trans fat. POINTS TO REMEMBER: YOU NEED A LITTLE TLC (THERAPEUTIC LIFESTYLE CHANGES)  Discuss your risk for heart disease with your caregivers, and take steps to reduce risk factors.   Change your diet. Choose foods that are low in saturated fat, trans fat, and cholesterol.   Add exercise to your daily routine if it is not already being done. Participate in physical activity of moderate intensity, like brisk walking, for at least 30 minutes on most, and preferably all days of the week. No time? Break the 30 minutes into three, 10-minute segments during the day.   Stop smoking. If you do smoke, contact your  caregiver to discuss ways in which they can help you quit.   Do not use street drugs.   Maintain a normal weight.   Maintain a healthy blood pressure.   Keep up with your blood work for checking the fats in your blood as directed by your caregiver.  Document Released: 04/21/2004 Document Revised: 06/23/2011 Document Reviewed: 11/17/2008 [facility] Patient Information 2012 [place], [place].

## 2011-11-30 NOTE — Progress Notes (Signed)
Subjective:    Patient ID: Donna Manning, female    DOB: 12-12-1950, 61 y.o.   MRN: [id number]  HPI Patient comes in today for preventive visit and follow-up of medical issues. Update  history since  last visit: No major changes . Has barretts on meds . No sx  No change thyroid med taking needs refills.  [facility] never got results of heart review  Last year. No cv sx at present.  Has gyne and Gi and eye doc. Last pap 2012  Review of Systems ROS:  GEN/ HEENT: No fever, significant weight changes sweats headaches vision problems hearing changes, CV/ PULM; No chest pain shortness of breath cough, syncope,edema  change in exercise tolerance. GI /GU: No adominal pain, vomiting, change in bowel habits. No blood in the stool. No significant GU symptoms. SKIN/HEME: ,no acute skin rashes suspicious lesions or bleeding. No lymphadenopathy, nodules, masses.  NEURO/ PSYCH:  No neurologic signs such as weakness numbness. somewhat down worried about childrens no suicidality 2 daughters with preg loss and  Fertility rx .  IMM/ Allergy: No unusual infections.  Allergy .   REST of 12 system review negative except as per HPI Outpatient Encounter Prescriptions as of 11/30/2011  Medication Sig Dispense Refill  . acyclovir (ZOVIRAX) 400 MG tablet 1 tid prn Pt needs to schedule a follow up appt before next refill.  30 tablet  0  . levothyroxine (SYNTHROID, LEVOTHROID) 100 MCG tablet Take 1 tablet (100 mcg total) by mouth daily.  15 tablet  1  . levothyroxine (SYNTHROID, LEVOTHROID) 88 MCG tablet Take 1 tablet (88 mcg total) by mouth daily.  30 tablet  1  . Multiple Vitamins-Minerals (CENTRUM PO) Take by mouth daily.      Marland Kitchen omeprazole (PRILOSEC) 40 MG capsule Take 40 mg by mouth daily.       Marland Kitchen acyclovir cream (ZOVIRAX) 5 % Apply 1 application topically. Apply to cold sore every 2-3 hours at onset or as directed.      . sucralfate (CARAFATE) 1 GM/10ML suspension Take by mouth 4 (four) times daily. Take 2  teaspoons two times a day.      Past history family history social history reviewed in the electronic medical record. Outpatient Encounter Prescriptions as of 11/30/2011  Medication Sig Dispense Refill  . acyclovir (ZOVIRAX) 400 MG tablet 1 tid prn Pt needs to schedule a follow up appt before next refill.  30 tablet  0  . levothyroxine (SYNTHROID, LEVOTHROID) 100 MCG tablet Take 1 tablet (100 mcg total) by mouth daily.  90 tablet  3  . levothyroxine (SYNTHROID, LEVOTHROID) 88 MCG tablet Take 1 tablet (88 mcg total) by mouth daily.  90 tablet  3  . Multiple Vitamins-Minerals (CENTRUM PO) Take by mouth daily.      Marland Kitchen omeprazole (PRILOSEC) 40 MG capsule Take 40 mg by mouth daily.       Marland Kitchen DISCONTD: levothyroxine (SYNTHROID, LEVOTHROID) 100 MCG tablet Take 1 tablet (100 mcg total) by mouth daily.  15 tablet  1  . DISCONTD: levothyroxine (SYNTHROID, LEVOTHROID) 88 MCG tablet Take 1 tablet (88 mcg total) by mouth daily.  30 tablet  1  . acyclovir cream (ZOVIRAX) 5 % Apply 1 application topically. Apply to cold sore every 2-3 hours at onset or as directed.      Marland Kitchen DISCONTD: sucralfate (CARAFATE) 1 GM/10ML suspension Take by mouth 4 (four) times daily. Take 2 teaspoons two times a day.  Objective:   Physical Exam BP 112/74  Pulse 80  Temp(Src) 98.5 F (36.9 C) (Oral)  Ht 5' 7.5" (1.715 m)  Wt 198 lb (89.812 kg)  BMI 30.55 kg/m2  SpO2 96% Physical Exam: Vital signs reviewed OZH:[id number] is a well-developed well-nourished alert cooperative  white female who appears her stated age in no acute distress.  HEENT: normocephalic atraumatic , Eyes: PERRL EOM's full, conjunctiva clear, Nares: paten,t no deformity discharge or tenderness., Ears: no deformity EAC's clear TMs with normal landmarks. Mouth: clear OP, no lesions, edema.  Moist mucous membranes. Dentition in adequate repair. NECK: supple without masses, thyromegaly or bruits. CHEST/PULM:  Clear to auscultation and percussion breath sounds  equal no wheeze , rales or rhonchi. No chest wall deformities or tenderness. Breast: normal by inspection . No dimpling, discharge, masses, tenderness or discharge . CV: PMI is nondisplaced, S1 S2 no gallops, murmurs, rubs. Peripheral pulses are full without delay.No JVD .  ABDOMEN: Bowel sounds normal nontender  No guard or rebound, no hepato splenomegal no CVA tenderness.  No hernia. Extremtities:  No clubbing cyanosis or edema, no acute joint swelling or redness no focal atrophy NEURO:  Oriented x3, cranial nerves 3-12 appear to be intact, no obvious focal weakness,gait within normal limits no abnormal reflexes or asymmetrical SKIN: No acute rashes normal turgor, color, no bruising or petechiae. Sun changes  PSYCH: Oriented, good eye contact, no obvious depression anxiety, cognition and judgment appear normal. LN: no cervical axillary inguinal adenopathy    Lab Results  Component Value Date   WBC 4.9 11/23/2011   HGB 12.8 11/23/2011   HCT 38.3 11/23/2011   PLT 249.0 11/23/2011   GLUCOSE 99 11/23/2011   CHOL 215* 11/23/2011   TRIG 173.0* 11/23/2011   HDL 52.00 11/23/2011   LDLDIRECT 147.2 11/23/2011   LDLCALC 131* 06/09/2009   ALT 31 11/23/2011   AST 23 11/23/2011   NA 139 11/23/2011   K 3.9 11/23/2011   CL 103 11/23/2011   CREATININE 0.8 11/23/2011   BUN 13 11/23/2011   CO2 26 11/23/2011   TSH 1.26 11/23/2011       Assessment & Plan:  Preventive Health Care Counseled regarding healthy nutrition, exercise, sleep, injury prevention, calcium vit d and healthy weight . tdap today :has infant grand child.  Thyroid  Stay on same dose LIPIDS  lsi  SKIN GI  barretts no sx  Fu gi as discussed Hx of ? Osteopenia and bone pain from actonel.  Reviewed cv stress test  As no ischemia .   Reviewed record and no dexa scan noted.

## 2011-12-03 ENCOUNTER — Encounter: Payer: Self-pay | Admitting: Internal Medicine

## 2011-12-03 DIAGNOSIS — K2270 Barrett's esophagus without dysplasia: Secondary | ICD-10-CM | POA: Insufficient documentation

## 2011-12-03 DIAGNOSIS — E781 Pure hyperglyceridemia: Secondary | ICD-10-CM | POA: Insufficient documentation

## 2011-12-03 DIAGNOSIS — Z0000 Encounter for general adult medical examination without abnormal findings: Secondary | ICD-10-CM | POA: Insufficient documentation

## 2011-12-03 DIAGNOSIS — M8580 Other specified disorders of bone density and structure, unspecified site: Secondary | ICD-10-CM | POA: Insufficient documentation

## 2011-12-14 ENCOUNTER — Telehealth: Payer: Self-pay | Admitting: Family Medicine

## 2011-12-14 MED ORDER — PREDNISONE 20 MG PO TABS: 20.0000 mg | ORAL_TABLET | Freq: Every day | ORAL | Status: AC

## 2011-12-14 NOTE — Telephone Encounter (Signed)
Please talk with patient live .  and document symptoms and location of her rash. It doesn't appear that this was done by this message  . Please report what she has tried .  If no fever  Which I assume  By the message we can call in 12 day prednisone taper off of my favorites ( I sent this in this evening )

## 2011-12-14 NOTE — Telephone Encounter (Signed)
Pls advise.  

## 2011-12-14 NOTE — Telephone Encounter (Signed)
Patient calling to check on status of Rx.

## 2011-12-14 NOTE — Telephone Encounter (Signed)
Pulled from Triage vmail - pt called at 10:39. Has poison ivy. Her OTC & Rx creams are not working. Wants Rx for the pills. Please call.

## 2011-12-15 NOTE — Telephone Encounter (Signed)
Spoke with pt and pt states the rash came up 2 days ago.  Pt states this is not the first time she had this.  Pt states the rash is blotchy and itches and it is on her arms and torso.  Pt denies fever.

## 2012-05-29 ENCOUNTER — Encounter: Payer: Self-pay | Admitting: Internal Medicine

## 2012-07-09 ENCOUNTER — Other Ambulatory Visit: Payer: Self-pay | Admitting: Internal Medicine

## 2012-11-23 ENCOUNTER — Other Ambulatory Visit: Payer: BC Managed Care – PPO

## 2012-11-27 ENCOUNTER — Other Ambulatory Visit (INDEPENDENT_AMBULATORY_CARE_PROVIDER_SITE_OTHER): Payer: BC Managed Care – PPO

## 2012-11-27 DIAGNOSIS — Z0000 Encounter for general adult medical examination without abnormal findings: Secondary | ICD-10-CM

## 2012-11-27 LAB — CBC WITH DIFFERENTIAL/PLATELET
Basophils Absolute: 0.1 10*3/uL (ref 0.0–0.1)
Eosinophils Absolute: 0.1 10*3/uL (ref 0.0–0.7)
HCT: 37.7 % (ref 36.0–46.0)
Hemoglobin: 13 g/dL (ref 12.0–15.0)
Lymphocytes Relative: 31.2 % (ref 12.0–46.0)
MCV: 88.1 fl (ref 78.0–100.0)
Neutro Abs: 2.7 10*3/uL (ref 1.4–7.7)
Neutrophils Relative %: 55.4 % (ref 43.0–77.0)
Platelets: 250 10*3/uL (ref 150.0–400.0)
RBC: 4.28 Mil/uL (ref 3.87–5.11)
WBC: 4.9 10*3/uL (ref 4.5–10.5)

## 2012-11-27 LAB — BASIC METABOLIC PANEL
Chloride: 107 mEq/L (ref 96–112)
Creatinine, Ser: 0.8 mg/dL (ref 0.4–1.2)

## 2012-11-27 LAB — HEPATIC FUNCTION PANEL
ALT: 22 U/L (ref 0–35)
Alkaline Phosphatase: 41 U/L (ref 39–117)
Bilirubin, Direct: 0 mg/dL (ref 0.0–0.3)
Total Protein: 7 g/dL (ref 6.0–8.3)

## 2012-11-27 LAB — LIPID PANEL
Cholesterol: 188 mg/dL (ref 0–200)
LDL Cholesterol: 123 mg/dL — ABNORMAL HIGH (ref 0–99)
Triglycerides: 98 mg/dL (ref 0.0–149.0)

## 2012-11-30 ENCOUNTER — Encounter: Payer: Self-pay | Admitting: Internal Medicine

## 2012-11-30 ENCOUNTER — Ambulatory Visit (INDEPENDENT_AMBULATORY_CARE_PROVIDER_SITE_OTHER): Payer: BC Managed Care – PPO | Admitting: Internal Medicine

## 2012-11-30 VITALS — BP 102/64 | HR 80 | Temp 98.0°F | Ht 67.75 in | Wt 198.0 lb

## 2012-11-30 DIAGNOSIS — E039 Hypothyroidism, unspecified: Secondary | ICD-10-CM

## 2012-11-30 DIAGNOSIS — K219 Gastro-esophageal reflux disease without esophagitis: Secondary | ICD-10-CM

## 2012-11-30 DIAGNOSIS — M899 Disorder of bone, unspecified: Secondary | ICD-10-CM

## 2012-11-30 DIAGNOSIS — R0789 Other chest pain: Secondary | ICD-10-CM

## 2012-11-30 DIAGNOSIS — K2270 Barrett's esophagus without dysplasia: Secondary | ICD-10-CM

## 2012-11-30 DIAGNOSIS — M8580 Other specified disorders of bone density and structure, unspecified site: Secondary | ICD-10-CM

## 2012-11-30 DIAGNOSIS — Z0000 Encounter for general adult medical examination without abnormal findings: Secondary | ICD-10-CM

## 2012-11-30 DIAGNOSIS — E785 Hyperlipidemia, unspecified: Secondary | ICD-10-CM

## 2012-11-30 MED ORDER — LEVOTHYROXINE SODIUM 88 MCG PO TABS: 88.0000 ug | ORAL_TABLET | Freq: Every day | ORAL | Status: DC

## 2012-11-30 NOTE — Progress Notes (Signed)
Chief Complaint  Patient presents with  . Annual Exam    HPI: Patient comes in today for Preventive Health Care visit  No major change in health status since last visit . She still has some concerns about chest pains that has increased somewhat most recently. Initially Dr. Noe Gens told her it could be from the Barrett's liver more recently when it increased in frequency he said he wasn't. describes it as Pressure pain and sharp and intense that lasts a few days or so no comes and goes .  No associations  Better when laying down.    ? Sob with walking or not  But  PF   Caused a problem .   ? Wheezing .  No real cough or hemoptysis A number of years ago she did see Dr. Tenny Craw for irregular heartbeat and chest pain. She apparently had an echocardiogram and an echo stress test January 2012 and carotid ultrasounds. Fairly normal except for hypertensive response to exercise. Recurred in January.  And then gone since that time asks if she should take aspirin  She recently had endoscopy for Barrett's and was told it is in control on about the same she is supposed to be on omeprazole 40 mg a day but she stopped it well before the endoscopy because she hurt it would be bad for her bone health. She didn't tell her gastroenterologist information. Asks about this. She gets an occasional to rare her nocturnal heartburn and takes over-the-counter Pepcid when she gets this. Denies any GI bleeding that she is aware of.  She was thinking of getting a second opinion however insurance has a very limited network. Especially procedures are done. She continues on her thyroid medication without change. Needs a refill today.  ROS:  GEN/ HEENT: No fever, significant weight changes sweats headaches vision problems hearing changes, CV/ PULM; No  cough, syncope,edema  change in exercise tolerance. GI /GU: No adominal pain, vomiting, change in bowel habits. No blood in the stool. No significant GU symptoms. SKIN/HEME: ,no acute  skin rashes suspicious lesions or bleeding. No lymphadenopathy, nodules, masses.  NEURO/ PSYCH:  No neurologic signs such as weakness numbness. No depression anxiety. She has noted a rare intermittent head bob and tremor in her left index finger that is very transient no other motor or stiffness problems or falling IMM/ Allergy: No unusual infections.  Allergy .   REST of 12 system review negative except as per HPI   Past Medical History  Diagnosis Date  . DVT (deep venous thrombosis)   . Hyperlipidemia   . Osteopenia     hx of bone pain on actonel  . Blood in stool   . GERD (gastroesophageal reflux disease)   . Barrett's esophagus     on egd 2009  . Environmental allergies   . Phlebitis   . DVT (deep vein thrombosis) in pregnancy   . Gynecological examination     Dr. Algie Coffer   . Thyroid disease     Family History  Problem Relation Age of Onset  . Heart failure Mother 53  . Parkinsonism Father 74  . Thyroid disease Sister   . Arthritis Other   . Diabetes Other   . Hyperlipidemia Other   . Hypertension Other   . Stroke Other    Past Surgical History  Procedure Laterality Date  . Breast biopsy      benign      History   Social History  . Marital Status: Married  Spouse Name: N/A    Number of Children: N/A  . Years of Education: N/A   Social History Main Topics  . Smoking status: Never Smoker   . Smokeless tobacco: None  . Alcohol Use: No  . Drug Use: None  . Sexually Active: None   Other Topics Concern  . None   Social History Narrative   Married   Regular Exercise- had not been    hh of 2  3    2  cxats of child    [place] [facility]     Outpatient Encounter Prescriptions as of 11/30/2012  Medication Sig Dispense Refill  . acyclovir (ZOVIRAX) 400 MG tablet TAKE 1 TAB THREE TIMES A DAY, PATIENT NEEDS TO SCHEDULE APPT BEFORE NEXT REFILL  30 tablet  3  . levothyroxine (SYNTHROID, LEVOTHROID) 100 MCG tablet Take 100 mcg by mouth daily before breakfast.       . levothyroxine (SYNTHROID, LEVOTHROID) 88 MCG tablet Take 1 tablet (88 mcg total) by mouth daily.  90 tablet  3  . Multiple Vitamins-Minerals (CENTRUM PO) Take by mouth daily.      Marland Kitchen omeprazole (PRILOSEC) 40 MG capsule Take 40 mg by mouth daily.       . [DISCONTINUED] levothyroxine (SYNTHROID, LEVOTHROID) 100 MCG tablet Take 1 tablet (100 mcg total) by mouth daily.  90 tablet  3  . [DISCONTINUED] levothyroxine (SYNTHROID, LEVOTHROID) 88 MCG tablet Take 1 tablet (88 mcg total) by mouth daily.  90 tablet  3  . [DISCONTINUED] acyclovir cream (ZOVIRAX) 5 % Apply 1 application topically. Apply to cold sore every 2-3 hours at onset or as directed.       No facility-administered encounter medications on file as of 11/30/2012.    EXAM:  BP 102/64  Pulse 80  Temp(Src) 98 F (36.7 C) (Oral)  Ht 5' 7.75" (1.721 m)  Wt 198 lb (89.812 kg)  BMI 30.32 kg/m2  SpO2 98%  Body mass index is 30.32 kg/(m^2).  Physical Exam: Vital signs reviewed ZOX:WRUE is a well-developed well-nourished alert cooperative   female who appears her stated age in no acute distress.  HEENT: normocephalic atraumatic , Eyes: PERRL EOM's full, conjunctiva clear, Nares: paten,t no deformity discharge or tenderness., Ears: no deformity EAC's clear TMs with normal landmarks. Mouth: clear OP, no lesions, edema.  Moist mucous membranes. Dentition in adequate repair. NECK: supple without masses, thyromegaly or bruits. CHEST/PULM:  Clear to auscultation and percussion breath sounds equal no wheeze , rales or rhonchi. No chest wall deformities or tenderness. Breast: normal by inspection . No dimpling, discharge, masses, tenderness or discharge . CV: PMI is nondisplaced, S1 S2 no gallops, murmurs, rubs. Peripheral pulses are full without delay.No JVD .  ABDOMEN: Bowel sounds normal nontender  No guard or rebound, no hepato splenomegal no CVA tenderness.  No hernia. Extremtities:  No clubbing cyanosis or edema, no acute joint swelling  or redness no focal atrophy NEURO:  Oriented x3, cranial nerves 3-12 appear to be intact, no obvious focal weakness,o abnormal reflexes or asymmetrical no rigidity or tremor noted SKIN: No acute rashes normal turgor, color, no bruising or petechiae. PSYCH: Oriented, good eye contact, no obvious depression anxiety, cognition and judgment appear normal. LN: no cervical axillary inguinal adenopathy  Lab Results  Component Value Date   WBC 4.9 11/27/2012   HGB 13.0 11/27/2012   HCT 37.7 11/27/2012   PLT 250.0 11/27/2012   GLUCOSE 102* 11/27/2012   CHOL 188 11/27/2012   TRIG 98.0 11/27/2012  HDL 45.20 11/27/2012   LDLDIRECT 147.2 11/23/2011   LDLCALC 123* 11/27/2012   ALT 22 11/27/2012   AST 20 11/27/2012   NA 138 11/27/2012   K 3.9 11/27/2012   CL 107 11/27/2012   CREATININE 0.8 11/27/2012   BUN 13 11/27/2012   CO2 25 11/27/2012   TSH 0.63 11/27/2012   EKG shows diffuse nonspecific T wave flattening no acute changes when compared to her last EKG a few years ago. ASSESSMENT AND PLAN:  Discussed the following assessment and plan:  Visit for preventive health examination - Discussed counseled soon to be up-to-date. Has a gynecologist  CHEST PAIN, ATYPICAL - Recurrent and recently more progressive and frequent very atypical for heart uncertain cause for review record I suppose could be GI esophageal related since sh - Plan: EKG 12-Lead  HYPOTHYROIDISM - No change in medicine  HYPERLIPIDEMIA  Barrett's esophagus  GERD  Osteopenia - No fracture known did use bisphosphonates in the past Overall reviewed record and she did have a quite thorough evaluation from Dr. Tenny Craw in 2012. With a negative stress echo except for hypertensive response to exercise. Chest pain is atypical for cardiac GI or pulmonary. However interesting that she's not been taking her omeprazole for a while because of concerns about bone health. Even though her gastroenterologist is not related to the Barrett's timing could be  consistent with a reflux problem. She's had no recent chest x-ray but has not had a diagnosis of lung disease had a ventilation/perfusion scan at least 4 or 5 years ago because of the concern that she could have had a PE and it was negative but might have changes of COPD currently she doesn't really have other symptoms. I suppose she could have some pulmonary cause of some of her symptoms we could consider PFTs to look at this.  I have some concern that she stopped her omeprazole onPPI and and would guess that her benefit would be more than risk in her particular situation would advise she's discuss this with her gastroenterologist so she doesn't feel comfortable with this at this time. May get a second opinion. Patient Care Team: Madelin Headings, MD as PCP - General Alphonsus Sias. Ernestina Penna, MD as Attending Physician (Obstetrics and Gynecology) Arlice Colt (Gastroenterology) Vincenza Hews, MD (Ophthalmology) Patient Instructions  Advise stay on the prilosec  Unless  Gi specilaist says not a high risk to do so .  Consider second opinion if needed.   Continue lifestyle intervention healthy eating and exercise . We can consider reevaluation.  For the chest pain    ? If a stress test is in order .   Will review record  .   Consider  Pulmonary funciton tests also .  You had a scan to check for lung blood clots a number of  Years ago and was negative.   Vit d supplement ok for bone health    Preventive Care for Adults, Female A healthy lifestyle and preventive care can promote health and wellness. Preventive health guidelines for women include the following key practices.  A routine yearly physical is a good way to check with your caregiver about your health and preventive screening. It is a chance to share any concerns and updates on your health, and to receive a thorough exam.  Visit your dentist for a routine exam and preventive care every 6 months. Brush your teeth twice a day and floss once a day.  Good oral hygiene prevents tooth decay and gum disease.  The frequency of eye exams is based on your age, health, family medical history, use of contact lenses, and other factors. Follow your caregiver's recommendations for frequency of eye exams.  Eat a healthy diet. Foods like vegetables, fruits, whole grains, low-fat dairy products, and lean protein foods contain the nutrients you need without too many calories. Decrease your intake of foods high in solid fats, added sugars, and salt. Eat the right amount of calories for you.Get information about a proper diet from your caregiver, if necessary.  Regular physical exercise is one of the most important things you can do for your health. Most adults should get at least 150 minutes of moderate-intensity exercise (any activity that increases your heart rate and causes you to sweat) each week. In addition, most adults need muscle-strengthening exercises on 2 or more days a week.  Maintain a healthy weight. The body mass index (BMI) is a screening tool to identify possible weight problems. It provides an estimate of body fat based on height and weight. Your caregiver can help determine your BMI, and can help you achieve or maintain a healthy weight.For adults 20 years and older:  A BMI below 18.5 is considered underweight.  A BMI of 18.5 to 24.9 is normal.  A BMI of 25 to 29.9 is considered overweight.  A BMI of 30 and above is considered obese.  Maintain normal blood lipids and cholesterol levels by exercising and minimizing your intake of saturated fat. Eat a balanced diet with plenty of fruit and vegetables. Blood tests for lipids and cholesterol should begin at age 4 and be repeated every 5 years. If your lipid or cholesterol levels are high, you are over 50, or you are at high risk for heart disease, you may need your cholesterol levels checked more frequently.Ongoing high lipid and cholesterol levels should be treated with medicines if diet  and exercise are not effective.  If you smoke, find out from your caregiver how to quit. If you do not use tobacco, do not start.  If you are pregnant, do not drink alcohol. If you are breastfeeding, be very cautious about drinking alcohol. If you are not pregnant and choose to drink alcohol, do not exceed 1 drink per day. One drink is considered to be 12 ounces (355 mL) of beer, 5 ounces (148 mL) of wine, or 1.5 ounces (44 mL) of liquor.  Avoid use of street drugs. Do not share needles with anyone. Ask for help if you need support or instructions about stopping the use of drugs.  High blood pressure causes heart disease and increases the risk of stroke. Your blood pressure should be checked at least every 1 to 2 years. Ongoing high blood pressure should be treated with medicines if weight loss and exercise are not effective.  If you are 68 to 63 years old, ask your caregiver if you should take aspirin to prevent strokes.  Diabetes screening involves taking a blood sample to check your fasting blood sugar level. This should be done once every 3 years, after age 28, if you are within normal weight and without risk factors for diabetes. Testing should be considered at a younger age or be carried out more frequently if you are overweight and have at least 1 risk factor for diabetes.  Breast cancer screening is essential preventive care for women. You should practice "breast self-awareness." This means understanding the normal appearance and feel of your breasts and may include breast self-examination. Any changes detected, no matter how  small, should be reported to a caregiver. Women in their 35s and 30s should have a clinical breast exam (CBE) by a caregiver as part of a regular health exam every 1 to 3 years. After age 83, women should have a CBE every year. Starting at age 20, women should consider having a mammography (breast X-ray test) every year. Women who have a family history of breast cancer  should talk to their caregiver about genetic screening. Women at a high risk of breast cancer should talk to their caregivers about having magnetic resonance imaging (MRI) and a mammography every year.  The Pap test is a screening test for cervical cancer. A Pap test can show cell changes on the cervix that might become cervical cancer if left untreated. A Pap test is a procedure in which cells are obtained and examined from the lower end of the uterus (cervix).  Women should have a Pap test starting at age 59.  Between ages 100 and 84, Pap tests should be repeated every 2 years.  Beginning at age 42, you should have a Pap test every 3 years as long as the past 3 Pap tests have been normal.  Some women have medical problems that increase the chance of getting cervical cancer. Talk to your caregiver about these problems. It is especially important to talk to your caregiver if a new problem develops soon after your last Pap test. In these cases, your caregiver may recommend more frequent screening and Pap tests.  The above recommendations are the same for women who have or have not gotten the vaccine for human papillomavirus (HPV).  If you had a hysterectomy for a problem that was not cancer or a condition that could lead to cancer, then you no longer need Pap tests. Even if you no longer need a Pap test, a regular exam is a good idea to make sure no other problems are starting.  If you are between ages 38 and 1, and you have had normal Pap tests going back 10 years, you no longer need Pap tests. Even if you no longer need a Pap test, a regular exam is a good idea to make sure no other problems are starting.  If you have had past treatment for cervical cancer or a condition that could lead to cancer, you need Pap tests and screening for cancer for at least 20 years after your treatment.  If Pap tests have been discontinued, risk factors (such as a new sexual partner) need to be reassessed to  determine if screening should be resumed.  The HPV test is an additional test that may be used for cervical cancer screening. The HPV test looks for the virus that can cause the cell changes on the cervix. The cells collected during the Pap test can be tested for HPV. The HPV test could be used to screen women aged 41 years and older, and should be used in women of any age who have unclear Pap test results. After the age of 55, women should have HPV testing at the same frequency as a Pap test.  Colorectal cancer can be detected and often prevented. Most routine colorectal cancer screening begins at the age of 54 and continues through age 85. However, your caregiver may recommend screening at an earlier age if you have risk factors for colon cancer. On a yearly basis, your caregiver may provide home test kits to check for hidden blood in the stool. Use of a small camera at the  end of a tube, to directly examine the colon (sigmoidoscopy or colonoscopy), can detect the earliest forms of colorectal cancer. Talk to your caregiver about this at age 89, when routine screening begins. Direct examination of the colon should be repeated every 5 to 10 years through age 57, unless early forms of pre-cancerous polyps or small growths are found.  Hepatitis C blood testing is recommended for all people born from [birth year] through 1965 and any individual with known risks for hepatitis C.  Practice safe sex. Use condoms and avoid high-risk sexual practices to reduce the spread of sexually transmitted infections (STIs). STIs include gonorrhea, chlamydia, syphilis, trichomonas, herpes, HPV, and human immunodeficiency virus (HIV). Herpes, HIV, and HPV are viral illnesses that have no cure. They can result in disability, cancer, and death. Sexually active women aged 48 and younger should be checked for chlamydia. Older women with new or multiple partners should also be tested for chlamydia. Testing for other STIs is recommended if  you are sexually active and at increased risk.  Osteoporosis is a disease in which the bones lose minerals and strength with aging. This can result in serious bone fractures. The risk of osteoporosis can be identified using a bone density scan. Women ages 27 and over and women at risk for fractures or osteoporosis should discuss screening with their caregivers. Ask your caregiver whether you should take a calcium supplement or vitamin D to reduce the rate of osteoporosis.  Menopause can be associated with physical symptoms and risks. Hormone replacement therapy is available to decrease symptoms and risks. You should talk to your caregiver about whether hormone replacement therapy is right for you.  Use sunscreen with sun protection factor (SPF) of 30 or more. Apply sunscreen liberally and repeatedly throughout the day. You should seek shade when your shadow is shorter than you. Protect yourself by wearing long sleeves, pants, a wide-brimmed hat, and sunglasses year round, whenever you are outdoors.  Once a month, do a whole body skin exam, using a mirror to look at the skin on your back. Notify your caregiver of new moles, moles that have irregular borders, moles that are larger than a pencil eraser, or moles that have changed in shape or color.  Stay current with required immunizations.  Influenza. You need a dose every fall (or winter). The composition of the flu vaccine changes each year, so being vaccinated once is not enough.  Pneumococcal polysaccharide. You need 1 to 2 doses if you smoke cigarettes or if you have certain chronic medical conditions. You need 1 dose at age 57 (or older) if you have never been vaccinated.  Tetanus, diphtheria, pertussis (Tdap, Td). Get 1 dose of Tdap vaccine if you are younger than age 18, are over 67 and have contact with an infant, are a Research scientist (physical sciences), are pregnant, or simply want to be protected from whooping cough. After that, you need a Td booster dose  every 10 years. Consult your caregiver if you have not had at least 3 tetanus and diphtheria-containing shots sometime in your life or have a deep or dirty wound.  HPV. You need this vaccine if you are a woman age 3 or younger. The vaccine is given in 3 doses over 6 months.  Measles, mumps, rubella (MMR). You need at least 1 dose of MMR if you were born in 1957 or later. You may also need a second dose.  Meningococcal. If you are age 2 to 42 and a first-year college student living in  a residence hall, or have one of several medical conditions, you need to get vaccinated against meningococcal disease. You may also need additional booster doses.  Zoster (shingles). If you are age 38 or older, you should get this vaccine.  Varicella (chickenpox). If you have never had chickenpox or you were vaccinated but received only 1 dose, talk to your caregiver to find out if you need this vaccine.  Hepatitis A. You need this vaccine if you have a specific risk factor for hepatitis A virus infection or you simply wish to be protected from this disease. The vaccine is usually given as 2 doses, 6 to 18 months apart.  Hepatitis B. You need this vaccine if you have a specific risk factor for hepatitis B virus infection or you simply wish to be protected from this disease. The vaccine is given in 3 doses, usually over 6 months. Preventive Services / Frequency Ages 7 to 70  Blood pressure check.** / Every 1 to 2 years.  Lipid and cholesterol check.** / Every 5 years beginning at age 27.  Clinical breast exam.** / Every 3 years for women in their 39s and 30s.  Pap test.** / Every 2 years from ages 9 through 9. Every 3 years starting at age 31 through age 83 or 31 with a history of 3 consecutive normal Pap tests.  HPV screening.** / Every 3 years from ages 30 through ages 36 to 5 with a history of 3 consecutive normal Pap tests.  Hepatitis C blood test.** / For any individual with known risks for  hepatitis C.  Skin self-exam. / Monthly.  Influenza immunization.** / Every year.  Pneumococcal polysaccharide immunization.** / 1 to 2 doses if you smoke cigarettes or if you have certain chronic medical conditions.  Tetanus, diphtheria, pertussis (Tdap, Td) immunization. / A one-time dose of Tdap vaccine. After that, you need a Td booster dose every 10 years.  HPV immunization. / 3 doses over 6 months, if you are 17 and younger.  Measles, mumps, rubella (MMR) immunization. / You need at least 1 dose of MMR if you were born in 1957 or later. You may also need a second dose.  Meningococcal immunization. / 1 dose if you are age 83 to 61 and a first-year college student living in a residence hall, or have one of several medical conditions, you need to get vaccinated against meningococcal disease. You may also need additional booster doses.  Varicella immunization.** / Consult your caregiver.  Hepatitis A immunization.** / Consult your caregiver. 2 doses, 6 to 18 months apart.  Hepatitis B immunization.** / Consult your caregiver. 3 doses usually over 6 months. Ages 84 to 61  Blood pressure check.** / Every 1 to 2 years.  Lipid and cholesterol check.** / Every 5 years beginning at age 15.  Clinical breast exam.** / Every year after age 77.  Mammogram.** / Every year beginning at age 40 and continuing for as long as you are in good health. Consult with your caregiver.  Pap test.** / Every 3 years starting at age 67 through age 72 or 26 with a history of 3 consecutive normal Pap tests.  HPV screening.** / Every 3 years from ages 49 through ages 62 to 53 with a history of 3 consecutive normal Pap tests.  Fecal occult blood test (FOBT) of stool. / Every year beginning at age 68 and continuing until age 47. You may not need to do this test if you get a colonoscopy every 10 years.  Flexible sigmoidoscopy or colonoscopy.** / Every 5 years for a flexible sigmoidoscopy or every 10 years for  a colonoscopy beginning at age 64 and continuing until age 15.  Hepatitis C blood test.** / For all people born from [birth year] through 1966 and any individual with known risks for hepatitis C.  Skin self-exam. / Monthly.  Influenza immunization.** / Every year.  Pneumococcal polysaccharide immunization.** / 1 to 2 doses if you smoke cigarettes or if you have certain chronic medical conditions.  Tetanus, diphtheria, pertussis (Tdap, Td) immunization.** / A one-time dose of Tdap vaccine. After that, you need a Td booster dose every 10 years.  Measles, mumps, rubella (MMR) immunization. / You need at least 1 dose of MMR if you were born in 1957 or later. You may also need a second dose.  Varicella immunization.** / Consult your caregiver.  Meningococcal immunization.** / Consult your caregiver.  Hepatitis A immunization.** / Consult your caregiver. 2 doses, 6 to 18 months apart.  Hepatitis B immunization.** / Consult your caregiver. 3 doses, usually over 6 months. Ages 58 and over  Blood pressure check.** / Every 1 to 2 years.  Lipid and cholesterol check.** / Every 5 years beginning at age 31.  Clinical breast exam.** / Every year after age 23.  Mammogram.** / Every year beginning at age 25 and continuing for as long as you are in good health. Consult with your caregiver.  Pap test.** / Every 3 years starting at age 47 through age 67 or 6 with a 3 consecutive normal Pap tests. Testing can be stopped between 65 and 70 with 3 consecutive normal Pap tests and no abnormal Pap or HPV tests in the past 10 years.  HPV screening.** / Every 3 years from ages 58 through ages 24 or 36 with a history of 3 consecutive normal Pap tests. Testing can be stopped between 65 and 70 with 3 consecutive normal Pap tests and no abnormal Pap or HPV tests in the past 10 years.  Fecal occult blood test (FOBT) of stool. / Every year beginning at age 72 and continuing until age 103. You may not need to do this test  if you get a colonoscopy every 10 years.  Flexible sigmoidoscopy or colonoscopy.** / Every 5 years for a flexible sigmoidoscopy or every 10 years for a colonoscopy beginning at age 48 and continuing until age 41.  Hepatitis C blood test.** / For all people born from [birth year] through 1965 and any individual with known risks for hepatitis C.  Osteoporosis screening.** / A one-time screening for women ages 74 and over and women at risk for fractures or osteoporosis.  Skin self-exam. / Monthly.  Influenza immunization.** / Every year.  Pneumococcal polysaccharide immunization.** / 1 dose at age 52 (or older) if you have never been vaccinated.  Tetanus, diphtheria, pertussis (Tdap, Td) immunization. / A one-time dose of Tdap vaccine if you are over 65 and have contact with an infant, are a Research scientist (physical sciences), or simply want to be protected from whooping cough. After that, you need a Td booster dose every 10 years.  Varicella immunization.** / Consult your caregiver.  Meningococcal immunization.** / Consult your caregiver.  Hepatitis A immunization.** / Consult your caregiver. 2 doses, 6 to 18 months apart.  Hepatitis B immunization.** / Check with your caregiver. 3 doses, usually over 6 months. ** Family history and personal history of risk and conditions may change your caregiver's recommendations. Document Released: 08/30/2001 Document Revised: 09/26/2011 Document Reviewed: 11/29/2010 [employer] Patient  Information 2013 [place], [place].       Neta Mends. Meryl Hubers M.D.  Health Maintenance  Topic Date Due  . Pap Smear  09/30/1967  . Zostavax  09/29/2009  . Influenza Vaccine  03/18/2013  . Mammogram  06/29/2013  . Colonoscopy  11/16/2020  . Tetanus/tdap  11/29/2021   Health Maintenance Review }  After record review consider PFTS spirometry   Updated DEXA if not done  If concern about bone health.   Reported as  Pap and colon utd.

## 2012-11-30 NOTE — Patient Instructions (Addendum)
Advise stay on the prilosec  Unless  Gi specilaist says not a high risk to do so .  Consider second opinion if needed.   Continue lifestyle intervention healthy eating and exercise . We can consider reevaluation.  For the chest pain    ? If a stress test is in order .   Will review record  .   Consider  Pulmonary funciton tests also .  You had a scan to check for lung blood clots a number of  Years ago and was negative.   Vit d supplement ok for bone health    Preventive Care for Adults, Female A healthy lifestyle and preventive care can promote health and wellness. Preventive health guidelines for women include the following key practices.  A routine yearly physical is a good way to check with your caregiver about your health and preventive screening. It is a chance to share any concerns and updates on your health, and to receive a thorough exam.  Visit your dentist for a routine exam and preventive care every 6 months. Brush your teeth twice a day and floss once a day. Good oral hygiene prevents tooth decay and gum disease.  The frequency of eye exams is based on your age, health, family medical history, use of contact lenses, and other factors. Follow your caregiver's recommendations for frequency of eye exams.  Eat a healthy diet. Foods like vegetables, fruits, whole grains, low-fat dairy products, and lean protein foods contain the nutrients you need without too many calories. Decrease your intake of foods high in solid fats, added sugars, and salt. Eat the right amount of calories for you.Get information about a proper diet from your caregiver, if necessary.  Regular physical exercise is one of the most important things you can do for your health. Most adults should get at least 150 minutes of moderate-intensity exercise (any activity that increases your heart rate and causes you to sweat) each week. In addition, most adults need muscle-strengthening exercises on 2 or more days a  week.  Maintain a healthy weight. The body mass index (BMI) is a screening tool to identify possible weight problems. It provides an estimate of body fat based on height and weight. Your caregiver can help determine your BMI, and can help you achieve or maintain a healthy weight.For adults 20 years and older:  A BMI below 18.5 is considered underweight.  A BMI of 18.5 to 24.9 is normal.  A BMI of 25 to 29.9 is considered overweight.  A BMI of 30 and above is considered obese.  Maintain normal blood lipids and cholesterol levels by exercising and minimizing your intake of saturated fat. Eat a balanced diet with plenty of fruit and vegetables. Blood tests for lipids and cholesterol should begin at age 54 and be repeated every 5 years. If your lipid or cholesterol levels are high, you are over 50, or you are at high risk for heart disease, you may need your cholesterol levels checked more frequently.Ongoing high lipid and cholesterol levels should be treated with medicines if diet and exercise are not effective.  If you smoke, find out from your caregiver how to quit. If you do not use tobacco, do not start.  If you are pregnant, do not drink alcohol. If you are breastfeeding, be very cautious about drinking alcohol. If you are not pregnant and choose to drink alcohol, do not exceed 1 drink per day. One drink is considered to be 12 ounces (355 mL) of beer, 5  ounces (148 mL) of wine, or 1.5 ounces (44 mL) of liquor.  Avoid use of street drugs. Do not share needles with anyone. Ask for help if you need support or instructions about stopping the use of drugs.  High blood pressure causes heart disease and increases the risk of stroke. Your blood pressure should be checked at least every 1 to 2 years. Ongoing high blood pressure should be treated with medicines if weight loss and exercise are not effective.  If you are 46 to 63 years old, ask your caregiver if you should take aspirin to prevent  strokes.  Diabetes screening involves taking a blood sample to check your fasting blood sugar level. This should be done once every 3 years, after age 49, if you are within normal weight and without risk factors for diabetes. Testing should be considered at a younger age or be carried out more frequently if you are overweight and have at least 1 risk factor for diabetes.  Breast cancer screening is essential preventive care for women. You should practice "breast self-awareness." This means understanding the normal appearance and feel of your breasts and may include breast self-examination. Any changes detected, no matter how small, should be reported to a caregiver. Women in their 22s and 30s should have a clinical breast exam (CBE) by a caregiver as part of a regular health exam every 1 to 3 years. After age 57, women should have a CBE every year. Starting at age 33, women should consider having a mammography (breast X-ray test) every year. Women who have a family history of breast cancer should talk to their caregiver about genetic screening. Women at a high risk of breast cancer should talk to their caregivers about having magnetic resonance imaging (MRI) and a mammography every year.  The Pap test is a screening test for cervical cancer. A Pap test can show cell changes on the cervix that might become cervical cancer if left untreated. A Pap test is a procedure in which cells are obtained and examined from the lower end of the uterus (cervix).  Women should have a Pap test starting at age 50.  Between ages 10 and 81, Pap tests should be repeated every 2 years.  Beginning at age 70, you should have a Pap test every 3 years as long as the past 3 Pap tests have been normal.  Some women have medical problems that increase the chance of getting cervical cancer. Talk to your caregiver about these problems. It is especially important to talk to your caregiver if a new problem develops soon after your last  Pap test. In these cases, your caregiver may recommend more frequent screening and Pap tests.  The above recommendations are the same for women who have or have not gotten the vaccine for human papillomavirus (HPV).  If you had a hysterectomy for a problem that was not cancer or a condition that could lead to cancer, then you no longer need Pap tests. Even if you no longer need a Pap test, a regular exam is a good idea to make sure no other problems are starting.  If you are between ages 1 and 51, and you have had normal Pap tests going back 10 years, you no longer need Pap tests. Even if you no longer need a Pap test, a regular exam is a good idea to make sure no other problems are starting.  If you have had past treatment for cervical cancer or a condition that could lead  to cancer, you need Pap tests and screening for cancer for at least 20 years after your treatment.  If Pap tests have been discontinued, risk factors (such as a new sexual partner) need to be reassessed to determine if screening should be resumed.  The HPV test is an additional test that may be used for cervical cancer screening. The HPV test looks for the virus that can cause the cell changes on the cervix. The cells collected during the Pap test can be tested for HPV. The HPV test could be used to screen women aged 64 years and older, and should be used in women of any age who have unclear Pap test results. After the age of 65, women should have HPV testing at the same frequency as a Pap test.  Colorectal cancer can be detected and often prevented. Most routine colorectal cancer screening begins at the age of 26 and continues through age 62. However, your caregiver may recommend screening at an earlier age if you have risk factors for colon cancer. On a yearly basis, your caregiver may provide home test kits to check for hidden blood in the stool. Use of a small camera at the end of a tube, to directly examine the colon  (sigmoidoscopy or colonoscopy), can detect the earliest forms of colorectal cancer. Talk to your caregiver about this at age 88, when routine screening begins. Direct examination of the colon should be repeated every 5 to 10 years through age 38, unless early forms of pre-cancerous polyps or small growths are found.  Hepatitis C blood testing is recommended for all people born from [birth year] through 1965 and any individual with known risks for hepatitis C.  Practice safe sex. Use condoms and avoid high-risk sexual practices to reduce the spread of sexually transmitted infections (STIs). STIs include gonorrhea, chlamydia, syphilis, trichomonas, herpes, HPV, and human immunodeficiency virus (HIV). Herpes, HIV, and HPV are viral illnesses that have no cure. They can result in disability, cancer, and death. Sexually active women aged 67 and younger should be checked for chlamydia. Older women with new or multiple partners should also be tested for chlamydia. Testing for other STIs is recommended if you are sexually active and at increased risk.  Osteoporosis is a disease in which the bones lose minerals and strength with aging. This can result in serious bone fractures. The risk of osteoporosis can be identified using a bone density scan. Women ages 37 and over and women at risk for fractures or osteoporosis should discuss screening with their caregivers. Ask your caregiver whether you should take a calcium supplement or vitamin D to reduce the rate of osteoporosis.  Menopause can be associated with physical symptoms and risks. Hormone replacement therapy is available to decrease symptoms and risks. You should talk to your caregiver about whether hormone replacement therapy is right for you.  Use sunscreen with sun protection factor (SPF) of 30 or more. Apply sunscreen liberally and repeatedly throughout the day. You should seek shade when your shadow is shorter than you. Protect yourself by wearing long sleeves,  pants, a wide-brimmed hat, and sunglasses year round, whenever you are outdoors.  Once a month, do a whole body skin exam, using a mirror to look at the skin on your back. Notify your caregiver of new moles, moles that have irregular borders, moles that are larger than a pencil eraser, or moles that have changed in shape or color.  Stay current with required immunizations.  Influenza. You need a dose every  fall (or winter). The composition of the flu vaccine changes each year, so being vaccinated once is not enough.  Pneumococcal polysaccharide. You need 1 to 2 doses if you smoke cigarettes or if you have certain chronic medical conditions. You need 1 dose at age 78 (or older) if you have never been vaccinated.  Tetanus, diphtheria, pertussis (Tdap, Td). Get 1 dose of Tdap vaccine if you are younger than age 100, are over 32 and have contact with an infant, are a Research scientist (physical sciences), are pregnant, or simply want to be protected from whooping cough. After that, you need a Td booster dose every 10 years. Consult your caregiver if you have not had at least 3 tetanus and diphtheria-containing shots sometime in your life or have a deep or dirty wound.  HPV. You need this vaccine if you are a woman age 58 or younger. The vaccine is given in 3 doses over 6 months.  Measles, mumps, rubella (MMR). You need at least 1 dose of MMR if you were born in 1957 or later. You may also need a second dose.  Meningococcal. If you are age 23 to 58 and a first-year college student living in a residence hall, or have one of several medical conditions, you need to get vaccinated against meningococcal disease. You may also need additional booster doses.  Zoster (shingles). If you are age 17 or older, you should get this vaccine.  Varicella (chickenpox). If you have never had chickenpox or you were vaccinated but received only 1 dose, talk to your caregiver to find out if you need this vaccine.  Hepatitis A. You need this  vaccine if you have a specific risk factor for hepatitis A virus infection or you simply wish to be protected from this disease. The vaccine is usually given as 2 doses, 6 to 18 months apart.  Hepatitis B. You need this vaccine if you have a specific risk factor for hepatitis B virus infection or you simply wish to be protected from this disease. The vaccine is given in 3 doses, usually over 6 months. Preventive Services / Frequency Ages 75 to 73  Blood pressure check.** / Every 1 to 2 years.  Lipid and cholesterol check.** / Every 5 years beginning at age 64.  Clinical breast exam.** / Every 3 years for women in their 60s and 30s.  Pap test.** / Every 2 years from ages 53 through 12. Every 3 years starting at age 34 through age 35 or 6 with a history of 3 consecutive normal Pap tests.  HPV screening.** / Every 3 years from ages 74 through ages 80 to 102 with a history of 3 consecutive normal Pap tests.  Hepatitis C blood test.** / For any individual with known risks for hepatitis C.  Skin self-exam. / Monthly.  Influenza immunization.** / Every year.  Pneumococcal polysaccharide immunization.** / 1 to 2 doses if you smoke cigarettes or if you have certain chronic medical conditions.  Tetanus, diphtheria, pertussis (Tdap, Td) immunization. / A one-time dose of Tdap vaccine. After that, you need a Td booster dose every 10 years.  HPV immunization. / 3 doses over 6 months, if you are 60 and younger.  Measles, mumps, rubella (MMR) immunization. / You need at least 1 dose of MMR if you were born in 1957 or later. You may also need a second dose.  Meningococcal immunization. / 1 dose if you are age 57 to 53 and a first-year college student living in a residence hall,  or have one of several medical conditions, you need to get vaccinated against meningococcal disease. You may also need additional booster doses.  Varicella immunization.** / Consult your caregiver.  Hepatitis A  immunization.** / Consult your caregiver. 2 doses, 6 to 18 months apart.  Hepatitis B immunization.** / Consult your caregiver. 3 doses usually over 6 months. Ages 3 to 47  Blood pressure check.** / Every 1 to 2 years.  Lipid and cholesterol check.** / Every 5 years beginning at age 47.  Clinical breast exam.** / Every year after age 36.  Mammogram.** / Every year beginning at age 60 and continuing for as long as you are in good health. Consult with your caregiver.  Pap test.** / Every 3 years starting at age 76 through age 8 or 46 with a history of 3 consecutive normal Pap tests.  HPV screening.** / Every 3 years from ages 57 through ages 78 to 62 with a history of 3 consecutive normal Pap tests.  Fecal occult blood test (FOBT) of stool. / Every year beginning at age 20 and continuing until age 62. You may not need to do this test if you get a colonoscopy every 10 years.  Flexible sigmoidoscopy or colonoscopy.** / Every 5 years for a flexible sigmoidoscopy or every 10 years for a colonoscopy beginning at age 42 and continuing until age 12.  Hepatitis C blood test.** / For all people born from [birth year] through 1966 and any individual with known risks for hepatitis C.  Skin self-exam. / Monthly.  Influenza immunization.** / Every year.  Pneumococcal polysaccharide immunization.** / 1 to 2 doses if you smoke cigarettes or if you have certain chronic medical conditions.  Tetanus, diphtheria, pertussis (Tdap, Td) immunization.** / A one-time dose of Tdap vaccine. After that, you need a Td booster dose every 10 years.  Measles, mumps, rubella (MMR) immunization. / You need at least 1 dose of MMR if you were born in 1957 or later. You may also need a second dose.  Varicella immunization.** / Consult your caregiver.  Meningococcal immunization.** / Consult your caregiver.  Hepatitis A immunization.** / Consult your caregiver. 2 doses, 6 to 18 months apart.  Hepatitis B immunization.** /  Consult your caregiver. 3 doses, usually over 6 months. Ages 40 and over  Blood pressure check.** / Every 1 to 2 years.  Lipid and cholesterol check.** / Every 5 years beginning at age 43.  Clinical breast exam.** / Every year after age 68.  Mammogram.** / Every year beginning at age 53 and continuing for as long as you are in good health. Consult with your caregiver.  Pap test.** / Every 3 years starting at age 35 through age 52 or 35 with a 3 consecutive normal Pap tests. Testing can be stopped between 65 and 70 with 3 consecutive normal Pap tests and no abnormal Pap or HPV tests in the past 10 years.  HPV screening.** / Every 3 years from ages 48 through ages 37 or 48 with a history of 3 consecutive normal Pap tests. Testing can be stopped between 65 and 70 with 3 consecutive normal Pap tests and no abnormal Pap or HPV tests in the past 10 years.  Fecal occult blood test (FOBT) of stool. / Every year beginning at age 12 and continuing until age 33. You may not need to do this test if you get a colonoscopy every 10 years.  Flexible sigmoidoscopy or colonoscopy.** / Every 5 years for a flexible sigmoidoscopy or every 10  years for a colonoscopy beginning at age 62 and continuing until age 44.  Hepatitis C blood test.** / For all people born from [birth year] through 1966 and any individual with known risks for hepatitis C.  Osteoporosis screening.** / A one-time screening for women ages 74 and over and women at risk for fractures or osteoporosis.  Skin self-exam. / Monthly.  Influenza immunization.** / Every year.  Pneumococcal polysaccharide immunization.** / 1 dose at age 75 (or older) if you have never been vaccinated.  Tetanus, diphtheria, pertussis (Tdap, Td) immunization. / A one-time dose of Tdap vaccine if you are over 65 and have contact with an infant, are a Research scientist (physical sciences), or simply want to be protected from whooping cough. After that, you need a Td booster dose every 10  years.  Varicella immunization.** / Consult your caregiver.  Meningococcal immunization.** / Consult your caregiver.  Hepatitis A immunization.** / Consult your caregiver. 2 doses, 6 to 18 months apart.  Hepatitis B immunization.** / Check with your caregiver. 3 doses, usually over 6 months. ** Family history and personal history of risk and conditions may change your caregiver's recommendations. Document Released: 08/30/2001 Document Revised: 09/26/2011 Document Reviewed: 11/29/2010 [facility] Patient Information 2013 [place], [place].

## 2012-12-12 ENCOUNTER — Telehealth: Payer: Self-pay | Admitting: Family Medicine

## 2012-12-12 NOTE — Telephone Encounter (Signed)
Spoke to the pt and informed her of all.  She will check with her insurance about the cost of a pulmonary function test.  She will also check with Dr. Elpidio Eric office about a bone density and have it sent over if there is one.  If not, she will call back for further information.  Instructed her to continue Prilosec/omeprazole and follow up if pain is persistent.

## 2012-12-12 NOTE — Telephone Encounter (Signed)
Message copied by Nils Flack on Wed Dec 12, 2012  8:45 AM ------      Message from: [facility] [place] [facility]      Created: Fri Nov 30, 2012  6:24 PM      Regarding: follow up       Misty tell the patient that I reviewed her record and I agree that she had a thorough heart evaluation 4 years ago or so.            There is some question of COPD changes on her x-ray scans. I doubt if that would cause her chest pain but could cause her some shortness of breath.            I don't know how much she would have to pay for this however pulmonary function tests would be appropriate.            Also she needs a bone density scan if she hasn't had one done in the last 2 years she has had it done elsewhere please have them send [place] a copy. Otherwise we can schedule it. This will tell us little bit about her bone health.            Have her continue going back on the Prilosec omeprazole and if her pain is getting worse and more frequent followup office visit.            Thanks       WP ------

## 2013-07-18 DIAGNOSIS — Z923 Personal history of irradiation: Secondary | ICD-10-CM

## 2013-07-18 HISTORY — DX: Personal history of irradiation: Z92.3

## 2013-07-18 HISTORY — PX: BREAST LUMPECTOMY: SHX2

## 2013-09-02 ENCOUNTER — Encounter: Payer: Self-pay | Admitting: Internal Medicine

## 2013-10-16 ENCOUNTER — Other Ambulatory Visit: Payer: Self-pay | Admitting: Internal Medicine

## 2013-10-17 ENCOUNTER — Other Ambulatory Visit: Payer: Self-pay | Admitting: Internal Medicine

## 2013-11-15 ENCOUNTER — Other Ambulatory Visit: Payer: Self-pay

## 2013-11-15 DIAGNOSIS — Z1231 Encounter for screening mammogram for malignant neoplasm of breast: Secondary | ICD-10-CM

## 2013-11-28 ENCOUNTER — Encounter (INDEPENDENT_AMBULATORY_CARE_PROVIDER_SITE_OTHER): Payer: Self-pay

## 2013-11-28 ENCOUNTER — Ambulatory Visit
Admission: RE | Admit: 2013-11-28 | Discharge: 2013-11-28 | Disposition: A | Discharge status: Home / Self Care | Payer: BC Managed Care – PPO | Source: Ambulatory Visit

## 2013-11-28 DIAGNOSIS — Z1231 Encounter for screening mammogram for malignant neoplasm of breast: Secondary | ICD-10-CM

## 2013-11-29 ENCOUNTER — Other Ambulatory Visit: Payer: Self-pay | Admitting: Internal Medicine

## 2013-11-29 DIAGNOSIS — R928 Other abnormal and inconclusive findings on diagnostic imaging of breast: Secondary | ICD-10-CM

## 2013-12-16 ENCOUNTER — Ambulatory Visit
Admission: RE | Admit: 2013-12-16 | Discharge: 2013-12-16 | Disposition: A | Discharge status: Home / Self Care | Payer: BC Managed Care – PPO | Source: Ambulatory Visit | Attending: Internal Medicine | Admitting: Internal Medicine

## 2013-12-16 ENCOUNTER — Other Ambulatory Visit: Payer: Self-pay | Admitting: Internal Medicine

## 2013-12-16 DIAGNOSIS — R928 Other abnormal and inconclusive findings on diagnostic imaging of breast: Secondary | ICD-10-CM

## 2013-12-18 ENCOUNTER — Other Ambulatory Visit: Payer: Self-pay

## 2013-12-18 ENCOUNTER — Other Ambulatory Visit: Payer: Self-pay | Admitting: Internal Medicine

## 2013-12-18 DIAGNOSIS — R928 Other abnormal and inconclusive findings on diagnostic imaging of breast: Secondary | ICD-10-CM

## 2013-12-20 NOTE — Telephone Encounter (Signed)
Sent to the pharmacy by e-scribe.  Pt has upcoming CPE in Sept.

## 2013-12-23 ENCOUNTER — Other Ambulatory Visit: Payer: Self-pay

## 2013-12-23 ENCOUNTER — Other Ambulatory Visit: Payer: Self-pay | Admitting: Internal Medicine

## 2013-12-23 DIAGNOSIS — R928 Other abnormal and inconclusive findings on diagnostic imaging of breast: Secondary | ICD-10-CM

## 2013-12-24 ENCOUNTER — Ambulatory Visit
Admission: RE | Admit: 2013-12-24 | Discharge: 2013-12-24 | Disposition: A | Discharge status: Home / Self Care | Payer: BC Managed Care – PPO | Source: Ambulatory Visit | Attending: Internal Medicine | Admitting: Internal Medicine

## 2013-12-24 DIAGNOSIS — R928 Other abnormal and inconclusive findings on diagnostic imaging of breast: Secondary | ICD-10-CM

## 2013-12-25 ENCOUNTER — Other Ambulatory Visit: Payer: Self-pay | Admitting: Internal Medicine

## 2013-12-25 DIAGNOSIS — C50919 Malignant neoplasm of unspecified site of unspecified female breast: Secondary | ICD-10-CM

## 2013-12-26 ENCOUNTER — Telehealth: Payer: Self-pay | Admitting: Internal Medicine

## 2013-12-26 NOTE — Telephone Encounter (Signed)
Voicemail received from [name] at GI requesting referral from [facility] for patients upcoming appt on 6/19 @8 :45 to have MRI of Breast(bilateral).  CPT code [id number], diagnosis code cancer of right breast.

## 2013-12-27 NOTE — Telephone Encounter (Signed)
[id number] authorization  done

## 2013-12-30 ENCOUNTER — Ambulatory Visit
Admission: RE | Admit: 2013-12-30 | Discharge: 2013-12-30 | Disposition: A | Discharge status: Home / Self Care | Payer: BC Managed Care – PPO | Source: Ambulatory Visit | Attending: Internal Medicine | Admitting: Internal Medicine

## 2013-12-30 ENCOUNTER — Telehealth: Payer: Self-pay | Admitting: *Deleted

## 2013-12-30 DIAGNOSIS — C50919 Malignant neoplasm of unspecified site of unspecified female breast: Secondary | ICD-10-CM

## 2013-12-30 MED ORDER — GADOBENATE DIMEGLUMINE 529 MG/ML IV SOLN
19.0000 mL | Freq: Once | INTRAVENOUS | Status: AC | PRN
  Administered 2013-12-30: 19 mL via INTRAVENOUS

## 2013-12-30 NOTE — Telephone Encounter (Signed)
Confirmed [facility] for 01/01/14 at 8am .  Instructions and contact information given.

## 2013-12-31 ENCOUNTER — Other Ambulatory Visit: Payer: Self-pay | Admitting: *Deleted

## 2013-12-31 DIAGNOSIS — C50411 Malignant neoplasm of upper-outer quadrant of right female breast: Secondary | ICD-10-CM | POA: Insufficient documentation

## 2014-01-01 ENCOUNTER — Encounter: Payer: Self-pay | Admitting: *Deleted

## 2014-01-01 ENCOUNTER — Ambulatory Visit: Payer: BC Managed Care – PPO | Attending: General Surgery | Admitting: Physical Therapy

## 2014-01-01 ENCOUNTER — Ambulatory Visit
Admission: RE | Admit: 2014-01-01 | Discharge: 2014-01-01 | Disposition: A | Discharge status: Home / Self Care | Payer: BC Managed Care – PPO | Source: Ambulatory Visit | Attending: Radiation Oncology | Admitting: Radiation Oncology

## 2014-01-01 ENCOUNTER — Encounter: Payer: Self-pay | Admitting: Oncology

## 2014-01-01 ENCOUNTER — Ambulatory Visit (HOSPITAL_BASED_OUTPATIENT_CLINIC_OR_DEPARTMENT_OTHER): Payer: BC Managed Care – PPO | Admitting: General Surgery

## 2014-01-01 ENCOUNTER — Ambulatory Visit: Payer: BC Managed Care – PPO | Admitting: Oncology

## 2014-01-01 ENCOUNTER — Ambulatory Visit (HOSPITAL_BASED_OUTPATIENT_CLINIC_OR_DEPARTMENT_OTHER): Payer: BC Managed Care – PPO

## 2014-01-01 ENCOUNTER — Encounter (INDEPENDENT_AMBULATORY_CARE_PROVIDER_SITE_OTHER): Payer: Self-pay | Admitting: General Surgery

## 2014-01-01 ENCOUNTER — Other Ambulatory Visit (HOSPITAL_BASED_OUTPATIENT_CLINIC_OR_DEPARTMENT_OTHER): Payer: BC Managed Care – PPO

## 2014-01-01 VITALS — BP 117/75 | HR 72 | Temp 98.3°F | Resp 18 | Ht 67.25 in | Wt 198.5 lb

## 2014-01-01 DIAGNOSIS — R293 Abnormal posture: Secondary | ICD-10-CM | POA: Diagnosis not present

## 2014-01-01 DIAGNOSIS — K2270 Barrett's esophagus without dysplasia: Secondary | ICD-10-CM | POA: Insufficient documentation

## 2014-01-01 DIAGNOSIS — C50411 Malignant neoplasm of upper-outer quadrant of right female breast: Secondary | ICD-10-CM

## 2014-01-01 DIAGNOSIS — D0590 Unspecified type of carcinoma in situ of unspecified breast: Secondary | ICD-10-CM | POA: Insufficient documentation

## 2014-01-01 DIAGNOSIS — C50419 Malignant neoplasm of upper-outer quadrant of unspecified female breast: Secondary | ICD-10-CM

## 2014-01-01 LAB — CBC WITH DIFFERENTIAL/PLATELET
BASO%: 1.6 % (ref 0.0–2.0)
BASOS ABS: 0.1 10*3/uL (ref 0.0–0.1)
EOS%: 5.9 % (ref 0.0–7.0)
Eosinophils Absolute: 0.3 10*3/uL (ref 0.0–0.5)
HEMATOCRIT: 37.2 % (ref 34.8–46.6)
HEMOGLOBIN: 12.2 g/dL (ref 11.6–15.9)
LYMPH%: 33.8 % (ref 14.0–49.7)
MCH: 29.8 pg (ref 25.1–34.0)
MCHC: 32.8 g/dL (ref 31.5–36.0)
MCV: 90.7 fL (ref 79.5–101.0)
MONO#: 0.4 10*3/uL (ref 0.1–0.9)
MONO%: 8.7 % (ref 0.0–14.0)
NEUT#: 2.1 10*3/uL (ref 1.5–6.5)
NEUT%: 50 % (ref 38.4–76.8)
Platelets: 262 10*3/uL (ref 145–400)
RBC: 4.1 10*6/uL (ref 3.70–5.45)
RDW: 12.9 % (ref 11.2–14.5)
WBC: 4.3 10*3/uL (ref 3.9–10.3)
lymph#: 1.4 10*3/uL (ref 0.9–3.3)

## 2014-01-01 LAB — COMPREHENSIVE METABOLIC PANEL (CC13)
ALK PHOS: 51 U/L (ref 40–150)
ALT: 22 U/L (ref 0–55)
AST: 19 U/L (ref 5–34)
Albumin: 3.9 g/dL (ref 3.5–5.0)
Anion Gap: 8 mEq/L (ref 3–11)
BUN: 12.9 mg/dL (ref 7.0–26.0)
CO2: 26 mEq/L (ref 22–29)
CREATININE: 0.9 mg/dL (ref 0.6–1.1)
Calcium: 9.2 mg/dL (ref 8.4–10.4)
Chloride: 110 mEq/L — ABNORMAL HIGH (ref 98–109)
Glucose: 113 mg/dl (ref 70–140)
Potassium: 3.9 mEq/L (ref 3.5–5.1)
Sodium: 143 mEq/L (ref 136–145)
Total Bilirubin: 0.39 mg/dL (ref 0.20–1.20)
Total Protein: 6.9 g/dL (ref 6.4–8.3)

## 2014-01-01 NOTE — Progress Notes (Signed)
Checked in new patient with no financial issues at this time. She has not seen the dr yet. She has not been out of the country and she has her appt card. I gave her [facility] packet and advised of alight grant and she will get income back to me if interested.

## 2014-01-01 NOTE — Progress Notes (Signed)
Patient ID: Donna Manning, female   DOB: 12-30-1950, 63 y.o.   MRN: [id number]  No chief complaint on file.   Note: This dictation was prepared with Dragon/digital dictation along with [employer]. Any transcriptional errors that result from this process are unintentional.  HPI Donna Manning is a 63 y.o. female.  She is referred by Dr. 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 Her PCP is Dr. [name]. Her gynecologist is Dr. Idelle Leech. She is being seen in the [facility] today by Dr. Pablo Ledger, Dr. Cranford Mon, and me.  Her only prior history of breast disease is a right breast biopsy many years ago for benign problems. Never took hormones. Breast fed one of 4 children.She generally has annual mammograms but actually started going to every other year recently. Recent screening mammograms and subsequent diagnostic mammograms revealed 2 separate groups of microcalcifications in the right breast at the 9:00 position. There was a 4 mm area of microcalcifications, somewhat heterogeneous in the anterior right breast 9:00 position. There is a 1.8 cm area of grouped and linear calcifications in the right breast, posteriorly. Both of these areas were biopsied. The anterior area shows ductal carcinoma in situ, there is a suspicion for invasion. The posterior area shows ductal carcinoma in situ, a variable grade. ER and PR are positive positive.  MRI shows the 2 biopsy cavities but no other abnormalities anywhere. The distance from the outside edge of one biopsy cavity to the other is 6.3 cm.  Family history is negative for breast or ovarian cancer. Mother died of congestive heart failure and father died of Parkinson's disease.  Comorbidities are minimal. She has Barrett's esophagus, followed in [facility]. No abdominal surgery. Never  smoker. May have had a DVT or possibly superficial phlebitis during pregnancy. She doesn't remember taking Coumadin. HPI  Past Medical History  Diagnosis Date  . DVT (deep venous thrombosis)   . Hyperlipidemia   . Osteopenia     hx of bone pain on actonel  . Blood in stool   . GERD (gastroesophageal reflux disease)   . Barrett's esophagus     on egd 2009  . Environmental allergies   . Phlebitis   . DVT (deep vein thrombosis) in pregnancy   . Gynecological examination     Dr. Valentino Saxon   . Thyroid disease     Past Surgical History  Procedure Laterality Date  . Breast biopsy      benign    Family History  Problem Relation Age of Onset  . Heart failure Mother 19  . Parkinsonism Father 95  . Thyroid disease Sister   . Arthritis Other   . Diabetes Other   . Hyperlipidemia Other   . Hypertension Other   . Stroke Other   . Stomach cancer Paternal Grandfather     Social History History  Substance Use Topics  . Smoking status: Never Smoker   . Smokeless tobacco: Not on file  . Alcohol Use: No    Allergies  Allergen Reactions  . Actonel [Risedronate Sodium] Other (See Comments)    Bone pain  . Iodine     REACTION: unspecified    allergic reaction to contrast?  . Latex   . Starch Rash    As laundry additive    Current Outpatient Prescriptions  Medication Sig Dispense Refill  . acyclovir (ZOVIRAX)  400 MG tablet TAKE 1 TAB THREE TIMES A DAY, PATIENT NEEDS TO SCHEDULE APPT BEFORE NEXT REFILL  30 tablet  3  . levothyroxine (SYNTHROID, LEVOTHROID) 100 MCG tablet Take 100 mcg by mouth daily before breakfast.      . levothyroxine (SYNTHROID, LEVOTHROID) 88 MCG tablet TAKE ONE TABLET BY MOUTH ONCE DAILY  90 tablet  0  . Multiple Vitamins-Minerals (CENTRUM PO) Take by mouth daily.      Marland Kitchen omeprazole (PRILOSEC) 40 MG capsule Take 40 mg by mouth daily.        No current facility-administered medications for this visit.    Review of Systems Review of Systems    Constitutional: Negative for fever, chills and unexpected weight change.  HENT: Negative for congestion, hearing loss, sore throat, trouble swallowing and voice change.   Eyes: Negative for visual disturbance.  Respiratory: Negative for cough and wheezing.   Cardiovascular: Negative for chest pain, palpitations and leg swelling.  Gastrointestinal: Negative for nausea, vomiting, abdominal pain, diarrhea, constipation, blood in stool, abdominal distention and anal bleeding.       GERD-controlled on PPI's.  Genitourinary: Negative for hematuria, vaginal bleeding and difficulty urinating.  Musculoskeletal: Negative for arthralgias.  Skin: Negative for rash and wound.  Neurological: Negative for seizures, syncope and headaches.  Hematological: Negative for adenopathy. Does not bruise/bleed easily.  Psychiatric/Behavioral: Negative for confusion.    There were no vitals taken for this visit.  Physical Exam Physical Exam  Constitutional: She is oriented to person, place, and time. She appears well-developed and well-nourished. No distress.  HENT:  Head: Normocephalic and atraumatic.  Nose: Nose normal.  Mouth/Throat: No oropharyngeal exudate.  Eyes: Conjunctivae and EOM are normal. Pupils are equal, round, and reactive to light. Left eye exhibits no discharge. No scleral icterus.  Neck: Neck supple. No JVD present. No tracheal deviation present. No thyromegaly present.  Cardiovascular: Normal rate, regular rhythm, normal heart sounds and intact distal pulses.   No murmur heard. Pulmonary/Chest: Effort normal and breath sounds normal. No respiratory distress. She has no wheezes. She has no rales. She exhibits no tenderness.    Breasts are large and somewhat ptotic. Small amount of bruising lateral right breast. There may be some tiny hematoma and thickening there  but no dominant mass. Curvilinear scar upper outer quadrant from remote biopsy. No other masses in either breast. No skin  changes. No axillary adenopathy. Bra size is 42D per patient's report.  Abdominal: Soft. Bowel sounds are normal. She exhibits no distension and no mass. There is no tenderness. There is no rebound and no guarding.  Musculoskeletal: She exhibits no edema and no tenderness.  Lymphadenopathy:    She has no cervical adenopathy.  Neurological: She is alert and oriented to person, place, and time. She exhibits normal muscle tone. Coordination normal.  Skin: Skin is warm. No rash noted. She is not diaphoretic. No erythema. No pallor.  Psychiatric: She has a normal mood and affect. Her behavior is normal. Judgment and thought content normal.    Data Reviewed I have reviewed her imaging studies, pathology and breast diagnostic protocol. I have discussed her case in breast conference this morning. I have coordinated her treatment plan with Dr. Pablo Ledger and Dr. Grayland Ormond.  Assessment    Ductal carcinoma in situ, receptor positive, right breast. 2 separate areas at 9:00 position, one anteriorly and one posteriorly.   Clinical stage Tis, N0. However there is suspicion of invasion on one of the biopsies.  Barrett's esophagus  Plan    We had a long discussion about management of breast cancer in general, multidisciplinary approach. We had a long discussion about surgical options. We talked about a bracketed  double wire localized right  lumpectomy with sentinel node  biopsy. We talked about mastectomy and sentinel node biopsy, with or without reconstruction. I told her I thought she was a reasonable candidate for lumpectomy.  Hospital long time discussing technique a double wire bracketed look lumpectomy. The long time discussing of the amount of tissue that would be removed and that the best volume would be reduced which may or may not be significant in the long-term. I told her that she might need a reduction of the left good symmetry was a real problem. We talked about what was involved with  mastectomy, with or without reconstruction. At the end of the conversation she clearly desires  breast conservation if  possible.   She will be scheduled for a right breast partial mastectomy with bracketed double wire localization, right axillary sentinel node biopsy.  We discussed the indications, and details, techniques, and numerous risks of this operation. She is aware of the risk of bleeding, infection, reoperation for positive margins, reoperation for positive nodes, cosmetic deformity, and other unforeseen problems. All of her questions are answered. She understands these issues well. She agrees with this plan.       Edsel Petrin. Dalbert Batman, M.D., [facility] General and Minimally invasive Surgery Breast and Colorectal Surgery Office:   [phone] Pager:   [phone]  01/01/2014, 10:07 AM

## 2014-01-01 NOTE — Patient Instructions (Signed)
Your imaging studies and biopsies  have diagnosed two separate small cancers in the lateral aspect of the right breast. These appeared to be noninvasive cancer, but one of the biopsies, the more anterior one, is suspicious for invasive cancer, although not diagnostic.  We have talked about surgical options and other aspects of breast cancer treatment.  We have decided to proceed with right breast lumpectomy with double wire bracketed localization, right axillary sentinel node biopsy.  Dr. Darrel Hoover office will contact you tomorrow to begin the scheduling process for the surgery.  Please call Dr. Dalbert Batman if any new questions come up     Lumpectomy A lumpectomy is a form of "breast conserving" or "breast preservation" surgery. It may also be referred to as a partial mastectomy. During a lumpectomy, the portion of the breast that contains the cancerous tumor or breast mass (the lump) is removed. Some normal tissue around the lump may also be removed to make sure all the tumor has been removed. This surgery should take 40 minutes or less. LET [facility] CARE PROVIDER KNOW ABOUT:  Any allergies you have.  All medicines you are taking, including vitamins, herbs, eye drops, creams, and over-the-counter medicines.  Previous problems you or members of your family have had with the use of anesthetics.  Any blood disorders you have.  Previous surgeries you have had.  Medical conditions you have. RISKS AND COMPLICATIONS Generally, this is a safe procedure. However, as with any procedure, complications can occur. Possible complications include:  Bleeding.  Infection.  Pain.  Temporary swelling.  Change in the shape of the breast, particularly if a large portion is removed. BEFORE THE PROCEDURE  Ask your health care provider about changing or stopping your regular medicines.  Do not eat or drink anything for 7-8 hours before the surgery or as directed by your health care provider. Ask  your health care provider if you can take a sip of water with any approved medicines.  On the day of surgery, your healthcare provider will use a mammogram or ultrasound to locate and mark the tumor in your breast. These markings on your breast will show where the cut (incision) will be made. PROCEDURE   An IV tube will be put into one of your veins.  You may be given medicine to help you relax before the surgery (sedative). You will be given one of the following:  A medicine that numbs the area (local anesthesia).  A medicine that makes you go to sleep (general anesthesia).  Your health care provider will use a kind of electric scalpel that uses heat to minimize bleeding (electrocautery knife).  A curved incision (like a smile or frown) that follows the natural curve of your breast is made, to allow for minimal scarring and better healing.  The tumor will be removed with some of the surrounding tissue. This will be sent to the lab for analysis. Your health care provider may also remove your lymph nodes at this time if needed.  Sometimes, but not always, a rubber tube called a drain will be surgically inserted into your breast area or armpit to collect excess fluid that may accumulate in the space where the tumor was. This drain is connected to a plastic bulb on the outside of your body. This drain creates suction to help remove the fluid.  The incisions will be closed with stitches (sutures).  A bandage may be placed over the incisions. AFTER THE PROCEDURE  You will be taken to the  recovery area.  You will be given medicine for pain.  A small rubber drain may be placed in the breast for 2-3 days to prevent a collection of blood (hematoma) from developing in the breast. You will be given instructions on caring for the drain before you go home.  A pressure bandage (dressing) will be applied for 1-2 days to prevent bleeding. Ask your health care provider how to care for your bandage at  home. Document Released: 08/15/2006 Document Revised: 03/06/2013 Document Reviewed: 12/07/2012 [facility] Patient Information 2015 [place], [place]. This information is not intended to replace advice given to you by your health care provider. Make sure you discuss any questions you have with your health care provider.

## 2014-01-01 NOTE — Progress Notes (Signed)
[facility] Psychosocial Distress Screening Clinical Social Work  Patient completed distress screening protocol, and scored a 5 on the Psychosocial Distress Thermometer which indicates moderate distress. Clinical Social Worker met with pt in [facility] to assess for distress and other psychosocial needs.  Pt stated she was doing well and her distress level was much lower after meeting with the physicians and getting more information on her treatment plan.  CSW provided pt with information on the support team and support services at [facility].  CSW encouraged pt to call with any questions or concerns.         Clinical Social Worker follow up needed: no    ONCBCN DISTRESS SCREENING 01/01/2014  Screening Type Initial Screening  Mark the number that describes how much distress you have been experiencing in the past week 5  Practical problem type Insurance  Emotional problem type Adjusting to illness  Information Concerns Type Lack of info about treatment  Physical Problem type Pain;Sleep/insomnia  Physician notified of physical symptoms Yes  Referral to clinical psychology No  Referral to clinical social work Yes  Referral to dietition No  Referral to financial advocate No  Referral to support programs No  Referral to palliative care No     Johnnye Lana, MSW, [place] [phone]

## 2014-01-01 NOTE — Progress Notes (Signed)
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  HISTORY OF PRESENT ILLNESS::Donna Manning is a 63 y.o. female who is presenting with calcifications seen on screening mammogram. She has 2 areas of calcification which were 4.6 cm apart. A biopsy was performed of both areas of both areas which showed DCIS in both lesions.  The anterior area had a focus suspicious for stromal invasion. The DCIS ranged from low to high grade and was ER +. The clips were noted to be 6.3 cm apart on the clip films. MRI onfirmed these 2 areas with a 31mm area of enhancement next to the anterior biopsy cavity. The left breast was negative and no enlarged axillary lymph nodes were noted. She was referred to be by Dr. Dalbert Batman for discussion of radiation in the management of her disease. She is post menopausal with menarche at 52 and her last period at 66. She is GX P5 with her first birth at 8. She has not used hormone replacement. She is alone in clinic today.   PREVIOUS RADIATION THERAPY: No  PAST MEDICAL HISTORY:  has a past medical history of DVT (deep venous thrombosis); Hyperlipidemia; Osteopenia; Blood in stool; GERD (gastroesophageal reflux disease); Barrett's esophagus; Environmental allergies; Phlebitis; DVT (deep vein thrombosis) in pregnancy; Gynecological examination; and Thyroid disease.    PAST SURGICAL HISTORY: Past Surgical History  Procedure Laterality Date  . Breast biopsy        benign    FAMILY HISTORY:  Family History  Problem Relation Age of Onset  . Heart failure Mother 18  . Parkinsonism Father 31  . Thyroid disease Sister   . Arthritis Other   . Diabetes Other   . Hyperlipidemia Other   . Hypertension Other   . Stroke Other   . Stomach cancer Paternal Grandfather     SOCIAL HISTORY:  History  Substance Use Topics  . Smoking status: Never Smoker   . Smokeless tobacco: Not on file  . Alcohol Use: No    ALLERGIES: Actonel; Iodine; Latex; and Starch  MEDICATIONS:  Current Outpatient Prescriptions  Medication Sig Dispense Refill  . acyclovir (ZOVIRAX) 400 MG tablet TAKE 1 TAB THREE TIMES A DAY, PATIENT NEEDS TO SCHEDULE APPT BEFORE NEXT REFILL  30 tablet  3  . levothyroxine (SYNTHROID, LEVOTHROID) 100 MCG tablet Take 100 mcg by mouth daily before breakfast.      . levothyroxine (SYNTHROID, LEVOTHROID) 88 MCG tablet TAKE ONE TABLET BY MOUTH ONCE DAILY  90 tablet  0  . Multiple Vitamins-Minerals (CENTRUM PO) Take by mouth daily.      Marland Kitchen omeprazole (PRILOSEC) 40 MG capsule Take 40 mg by mouth daily.        No current facility-administered medications for this encounter.    REVIEW OF SYSTEMS:  A 15 point review of systems is documented in the electronic medical record. This was obtained by the nursing staff. However, I reviewed this with the patient to discuss relevant findings and  make appropriate changes.  Pertinent items are noted in HPI.  PHYSICAL EXAM: There were no vitals filed for this visit.. . Bruising over the lateral right breast (uppoer outer to lower outer quadrant). Som palpable seroma/biopsy change. She has no palpable adenopathy. She is alert and oriented x 3.   LABORATORY DATA:  Lab Results  Component Value Date   WBC 4.3 01/01/2014   HGB 12.2 01/01/2014   HCT 37.2 01/01/2014   MCV 90.7 01/01/2014   PLT 262 01/01/2014   Lab Results  Component Value Date   NA 143 01/01/2014   K 3.9 01/01/2014   CL 107 11/27/2012   CO2 26  01/01/2014   Lab Results  Component Value Date   ALT 22 01/01/2014   AST 19 01/01/2014   ALKPHOS 51 01/01/2014   BILITOT 0.39 01/01/2014     RADIOGRAPHY: Mr Breast Bilateral W Wo Contrast  12/31/2013   CLINICAL DATA:  Recently biopsy proven right breast cancer in 2 locations in the 9 o'clock location demonstrating DCIS.  LABS:  BUN and creatinine were obtained on site at [place] at  [address]  Results:  BUN 14 mg/dL,  Creatinine 0.8 mg/dL.  EXAM: BILATERAL BREAST MRI WITH AND WITHOUT CONTRAST  TECHNIQUE: Multiplanar, multisequence MR images of both breasts were obtained prior to and following the intravenous administration of 73ml of MultiHance.  THREE-DIMENSIONAL MR IMAGE RENDERING ON INDEPENDENT WORKSTATION:  Three-dimensional MR images were rendered by post-processing of the original MR data on an independent workstation. The three-dimensional MR images were interpreted, and findings are reported in the following complete MRI report for this study. Three dimensional images were evaluated at the independent DynaCad workstation  COMPARISON:  Previous exams  FINDINGS: Breast composition: c:  Heterogeneous fibroglandular tissue  Background parenchymal enhancement: Mild  Right breast: 2 biopsy cavities with predominantly smooth rim enhancement are identified in the right breast 9 o'clock location with associated clip artifact, corresponding to the areas of biopsy proven breast cancer. The areas of rim enhancement demonstrate predominantly plateau type enhancement kinetics. There are a few scattered foci of probable parenchymal type enhancement adjacent to the biopsy cavities, although foci of DCIS could appear similar. Immediately contiguous with the superior aspect of the more anteriorly located biopsy cavities in the right breast 9 o'clock location, there is an irregular enhancing mass measuring 9 mm and demonstrating plateau type enhancement kinetics. Measured in antero posterior  dimension from the anterior aspect of the anterior biopsy cavity to the posterior aspect of the posterior biopsy cavity, this region spans 6 cm.  Left breast: No mass or abnormal enhancement.  Lymph nodes: No abnormal appearing lymph nodes.  Ancillary findings:  Right breast post biopsy changes as above.  IMPRESSION: Post biopsy changes with predominantly smooth rim enhancement of the right breast 9 o'clock location biopsy sites, although there is an immediately contiguous 9 mm irregular enhancing mass contiguous with the superior aspect of the more anterior biopsy cavities, compatible with the diagnosis of malignancy. Measured together, the biopsy cavities and intervening parenchyma span 6 cm in antero posterior dimension, compatible with at least multifocal disease.  Allowing for the presence of scattered foci of probable parenchymal type enhancement bilaterally, there is no other evidence for malignancy elsewhere in the right or left breast, although microscopic DCIS could appear similar.  RECOMMENDATION: Treatment plan  BI-RADS CATEGORY  6: Known biopsy-proven malignancy.   Electronically Signed   By: Conchita Paris M.D.   On: 12/31/2013 11:12  Mm Digital Diagnostic Unilat R  12/16/2013   CLINICAL DATA:  Abnormal right screening mammogram.  EXAM: DIGITAL DIAGNOSTIC  RIGHT MAMMOGRAM  COMPARISON:  With priors.  ACR Breast Density Category b: There are scattered areas of fibroglandular density.  FINDINGS: Magnification views of the lateral aspect of the right breast were obtained. There are 2 groups of calcifications. The more anterior grouped calcifications are heterogeneous in appearance measuring 4 mm. Located 4.6 cm posteriorly is a second cluster of grouped, linear heterogeneous calcifications spanning an area of 1.8 cm. There is no associated mass.  IMPRESSION: Two suspicious clusters of calcifications in the right breast.  RECOMMENDATION: Stereotactic biopsies of the 2 groups of calcifications in the  right breast is recommended and has been scheduled on 12/24/2013.  I have discussed the findings and recommendations with the patient. Results were also provided in writing at the conclusion of the visit. If applicable, a reminder letter will be sent to the patient regarding the next appointment.  BI-RADS CATEGORY  4: Suspicious.   Electronically Signed   By: Lillia Mountain M.D.   On: 12/16/2013 11:05   Mm Rt Breast Bx W Loc Dev 1st Lesion Image Bx Spec Stereo Guide  12/27/2013   ADDENDUM REPORT: 12/25/2013 11:32  ADDENDUM: Pathology revealed high grade ductal carcinoma in situ with calcifications in the posterior lateral and the anterior lateral right breast biopsies. This was found to be concordant by Dr. Lovey Newcomer. The pathology was discussed with the patient and her questions were answered. She reported tenderness and swelling in the right breast. Post biopsy care and instructions were reviewed. She has been scheduled at the [facility] on January 01, 2014 and for a bilateral breast MRI on January 03, 2014. Both appointments were given to the patient and she was encouraged to come to [facility] for educational materials. My number was provided to the patient for future questions and concerns.  Pathology results reported by Susa Raring RN, BSN on December 25, 2013.   Electronically Signed   By: Lovey Newcomer M.D.   On: 12/25/2013 11:32   12/27/2013   CLINICAL DATA:  Patient with two separate sites of suspicious right breast calcifications.  EXAM: RIGHT BREAST STEREOTACTIC CORE NEEDLE BIOPSY  COMPARISON:  Previous exams.  FINDINGS: The patient and I discussed the procedure of stereotactic-guided biopsy including benefits and alternatives. We discussed the high likelihood of a successful procedure. We discussed the risks of the procedure including infection, bleeding, tissue injury, clip migration, and inadequate sampling. Informed written consent was given. The  usual time out protocol was performed immediately prior to the procedure.  Site 1: (posterior lateral; coil shaped clip).  Using sterile technique and 2% Lidocaine as local anesthetic, under stereotactic guidance, a 9 gauge vacuum assisted core needle biopsy device was used to perform core needle biopsy of calcifications within the posterior lateral right breast using a lateral approach. Specimen radiograph was performed showing calcifications. Specimens with calcifications are identified for pathology.  At the conclusion of the procedure, a coil shaped tissue marker clip was deployed into the biopsy cavity. Follow-up 2-view mammogram confirmed clip 1 cm lateral to the biopsy cavity.  Site 2:  (anterior lateral; X shaped clip).  Using sterile technique and 2% Lidocaine as local anesthetic, under stereotactic guidance, a 9 gauge vacuum assisted core needle biopsy device was used to perform core needle biopsy of calcifications within the anterior lateral right breast using a lateral approach. Specimen radiograph  was performed showing calcifications. Specimens with calcifications are identified for pathology.  At the conclusion of the procedure, a X shaped tissue marker clip was deployed into the biopsy cavity. Follow-up 2-view mammogram confirmed clip in appropriate position.  IMPRESSION: Stereotactic-guided biopsy of two separate sites of suspicious calcifications within the right breast. No apparent complications.  Site 1: (posterior lateral; coil shaped clip). Coil shaped clip is located 1 cm lateral to the biopsy cavity.  Site 2: (anterior lateral; X shaped clip).  Electronically Signed: By: Lovey Newcomer M.D. 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 This was found  to be concordant by Dr. Lovey Newcomer. The pathology was discussed with the patient and her questions were answered. She reported tenderness and swelling in the right breast. Post biopsy care and instructions were reviewed. She has been scheduled at the [facility] on January 01, 2014 and for a bilateral breast MRI on January 03, 2014. Both appointments were given to the patient and she was encouraged to come to [facility] for educational materials. My number was provided to the patient for future questions and concerns.  Pathology results reported by Susa Raring RN, BSN on December 25, 2013.   Electronically Signed   By: Lovey Newcomer M.D.   On: 12/25/2013 11:32   12/27/2013   CLINICAL DATA:  Patient with two separate sites of suspicious right breast calcifications.  EXAM: RIGHT BREAST STEREOTACTIC CORE NEEDLE BIOPSY  COMPARISON:  Previous exams.  FINDINGS: The patient and I discussed the procedure of stereotactic-guided biopsy including benefits and alternatives. We discussed the high likelihood of a successful procedure. We discussed the risks of the procedure including infection, bleeding, tissue injury, clip migration, and inadequate sampling. Informed written consent was given. The usual time out protocol was performed immediately prior to the procedure.  Site 1: (posterior lateral; coil shaped clip).  Using sterile technique and 2% Lidocaine as local anesthetic, under stereotactic guidance, a 9 gauge vacuum assisted core needle biopsy device was used to perform core needle biopsy of calcifications within the posterior lateral right breast using a lateral approach. Specimen radiograph was performed showing calcifications. Specimens with calcifications are identified for pathology.  At the conclusion of the procedure, a coil shaped tissue marker clip was deployed into the biopsy cavity. Follow-up 2-view mammogram confirmed clip 1 cm lateral to the biopsy cavity.   Site 2:  (anterior lateral; X shaped clip).  Using sterile technique and 2% Lidocaine as local anesthetic, under stereotactic guidance, a 9 gauge vacuum assisted core needle biopsy device was used to perform core needle biopsy of calcifications within the anterior lateral right breast using a lateral approach. Specimen radiograph was performed showing calcifications. Specimens with calcifications are identified for pathology.  At the conclusion of the procedure, a X shaped tissue marker clip was deployed into the biopsy cavity. Follow-up 2-view mammogram confirmed clip in appropriate position.  IMPRESSION: Stereotactic-guided biopsy of two separate sites of suspicious calcifications within the right breast. No apparent complications.  Site 1: (posterior lateral; coil shaped clip). Coil shaped clip is located 1 cm lateral to the biopsy cavity.  Site 2: (anterior lateral; X shaped clip).  Electronically Signed: By: Lovey Newcomer M.D. On: 12/24/2013  17:02      IMPRESSION: multifocal DCIS of the left breast  PLAN:She has elected for attempt at breast conservation after dicussing mastectomy vs lumpectomy with Dr. Dalbert Batman. We discussed that she may be asymmetric with such a large amount of tissue removed and may require a prosthesis. I spoke to the patient today regarding her diagnosis and options for treatment. We discussed the equivalence in terms of survival and local failure between mastectomy and breast conservation. We discussed the role of radiation in decreasing local failures in patients who undergo lumpectomy. We discussed the process of simulation and the placement tattoos. We discussed 4-6 weeks of treatment as an outpatient. We discussed the possibility of asymptomatic lung damage. We discussed the low likelihood of secondary malignancies. We discussed the possible side effects including but not limited to skin redness, fatigue, permanent skin darkening, and breast swelling. I will plan on seeing her back  after surgery. ------------------------------------------------  Thea Silversmith, MD

## 2014-01-03 ENCOUNTER — Other Ambulatory Visit: Payer: BC Managed Care – PPO

## 2014-01-06 ENCOUNTER — Other Ambulatory Visit (INDEPENDENT_AMBULATORY_CARE_PROVIDER_SITE_OTHER): Payer: Self-pay | Admitting: General Surgery

## 2014-01-06 DIAGNOSIS — C50411 Malignant neoplasm of upper-outer quadrant of right female breast: Secondary | ICD-10-CM

## 2014-01-09 ENCOUNTER — Encounter: Payer: Self-pay | Admitting: *Deleted

## 2014-01-09 ENCOUNTER — Telehealth: Payer: Self-pay | Admitting: *Deleted

## 2014-01-09 NOTE — Telephone Encounter (Signed)
Left vm for pt to return call regarding [place] from 01/01/14.

## 2014-01-10 ENCOUNTER — Telehealth: Payer: Self-pay | Admitting: Hematology and Oncology

## 2014-01-10 NOTE — Telephone Encounter (Signed)
, °

## 2014-01-13 ENCOUNTER — Encounter (HOSPITAL_BASED_OUTPATIENT_CLINIC_OR_DEPARTMENT_OTHER): Payer: Self-pay | Admitting: *Deleted

## 2014-01-13 NOTE — Progress Notes (Signed)
Pt will come in for [facility] labs and cxr and u/a-had cbc cmet-01/01/14

## 2014-01-15 ENCOUNTER — Encounter (HOSPITAL_BASED_OUTPATIENT_CLINIC_OR_DEPARTMENT_OTHER)
Admission: RE | Admit: 2014-01-15 | Discharge: 2014-01-15 | Disposition: A | Discharge status: Home / Self Care | Payer: BC Managed Care – PPO | Source: Ambulatory Visit | Attending: General Surgery | Admitting: General Surgery

## 2014-01-15 ENCOUNTER — Ambulatory Visit
Admission: RE | Admit: 2014-01-15 | Discharge: 2014-01-15 | Disposition: A | Discharge status: Home / Self Care | Payer: BC Managed Care – PPO | Source: Ambulatory Visit | Attending: General Surgery | Admitting: General Surgery

## 2014-01-15 DIAGNOSIS — C50919 Malignant neoplasm of unspecified site of unspecified female breast: Secondary | ICD-10-CM

## 2014-01-15 DIAGNOSIS — Z01812 Encounter for preprocedural laboratory examination: Secondary | ICD-10-CM | POA: Insufficient documentation

## 2014-01-15 DIAGNOSIS — C50911 Malignant neoplasm of unspecified site of right female breast: Secondary | ICD-10-CM

## 2014-01-15 HISTORY — DX: Malignant neoplasm of unspecified site of right female breast: C50.911

## 2014-01-15 HISTORY — DX: Malignant neoplasm of unspecified site of unspecified female breast: C50.919

## 2014-01-15 LAB — URINALYSIS, ROUTINE W REFLEX MICROSCOPIC
Bilirubin Urine: NEGATIVE
GLUCOSE, UA: NEGATIVE mg/dL
Hgb urine dipstick: NEGATIVE
Ketones, ur: NEGATIVE mg/dL
LEUKOCYTES UA: NEGATIVE
Nitrite: NEGATIVE
PH: 5 (ref 5.0–8.0)
PROTEIN: NEGATIVE mg/dL
Specific Gravity, Urine: 1.023 (ref 1.005–1.030)
Urobilinogen, UA: 0.2 mg/dL (ref 0.0–1.0)

## 2014-01-15 LAB — LIPASE, BLOOD: Lipase: 49 U/L (ref 11–59)

## 2014-01-15 NOTE — H&P (Signed)
Donna Manning   MRN:  [id number]   Description: 63 year old female  Provider: [name], MD  Department: [facility]         Diagnoses      Breast cancer of upper-outer quadrant of right female breast    -  Primary      174.4               History and Physical     [name], MD    Status: Signed            Patient ID: Donna Manning, female   DOB: [birth year], 63 y.o.   MRN: [id number]            Note:  This dictation was prepared with Dragon/digital dictation along with [facility] technology. Any transcriptional errors that result from this process are unintentional.   HPI Donna Manning is a 63 y.o. female.  She is referred by Dr. 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 Her PCP is Dr. [name]. Her gynecologist is Dr. Idelle Leech. She is being seen in the [facility] today by Dr. Pablo Ledger, Dr. Cranford Mon, and me.   Her only prior history of breast disease is a right breast biopsy many years ago for benign problems. Never took hormones. Breast fed one of 4 children.She generally has annual mammograms but actually started going to every other year recently. Recent screening mammograms and subsequent diagnostic mammograms revealed 2 separate groups of microcalcifications in the right breast at the 9:00 position. There was a 4 mm area of microcalcifications, somewhat heterogeneous in the anterior right breast 9:00 position. There is a 1.8 cm area of grouped and linear calcifications in the right breast, posteriorly. Both of these areas were biopsied. The anterior area shows ductal carcinoma in situ, there is a suspicion for invasion. The posterior area shows ductal carcinoma in situ, a variable grade. ER and PR are positive positive.   MRI shows the 2 biopsy cavities but no other  abnormalities anywhere. The distance from the outside edge of one biopsy cavity to the other is 6.3 cm.   Family history is negative for breast or ovarian cancer. Mother died of congestive heart failure and father died of Parkinson's disease.   Comorbidities are minimal. She has Barrett's esophagus, followed in [facility]. No abdominal surgery. Never smoker. May have had a DVT or possibly superficial phlebitis during pregnancy. She doesn't remember taking Coumadin.        Past Medical History   Diagnosis  Date   .  DVT (deep venous thrombosis)     .  Hyperlipidemia     .  Osteopenia         hx of bone pain on actonel   .  Blood in stool     .  GERD (gastroesophageal reflux disease)     .  Barrett's esophagus         on egd 2009   .  Environmental allergies     .  Phlebitis     .  DVT (deep vein thrombosis) in pregnancy     .  Gynecological examination         Dr. Valentino Saxon    .  Thyroid disease           Past Surgical History  Procedure  Laterality  Date   .  Breast biopsy           benign         Family History   Problem  Relation  Age of Onset   .  Heart failure  Mother  15   .  Parkinsonism  Father  64   .  Thyroid disease  Sister     .  Arthritis  Other     .  Diabetes  Other     .  Hyperlipidemia  Other     .  Hypertension  Other     .  Stroke  Other     .  Stomach cancer  Paternal Grandfather          Social History History   Substance Use Topics   .  Smoking status:  Never Smoker    .  Smokeless tobacco:  Not on file   .  Alcohol Use:  No         Allergies   Allergen  Reactions   .  Actonel [Risedronate Sodium]  Other (See Comments)       Bone pain   .  Iodine         REACTION: unspecified    allergic reaction to contrast?   .  Latex     .  Starch  Rash       As laundry additive         Current Outpatient Prescriptions   Medication  Sig  Dispense  Refill   .  acyclovir (ZOVIRAX) 400 MG tablet  TAKE 1 TAB THREE TIMES A DAY,  PATIENT NEEDS TO SCHEDULE APPT BEFORE NEXT REFILL   30 tablet   3   .  levothyroxine (SYNTHROID, LEVOTHROID) 100 MCG tablet  Take 100 mcg by mouth daily before breakfast.         .  levothyroxine (SYNTHROID, LEVOTHROID) 88 MCG tablet  TAKE ONE TABLET BY MOUTH ONCE DAILY   90 tablet   0   .  Multiple Vitamins-Minerals (CENTRUM PO)  Take by mouth daily.         Marland Kitchen  omeprazole (PRILOSEC) 40 MG capsule  Take 40 mg by mouth daily.            .        Review of Systems  Constitutional: Negative for fever, chills and unexpected weight change.  HENT: Negative for congestion, hearing loss, sore throat, trouble swallowing and voice change.   Eyes: Negative for visual disturbance.  Respiratory: Negative for cough and wheezing.   Cardiovascular: Negative for chest pain, palpitations and leg swelling.  Gastrointestinal: Negative for nausea, vomiting, abdominal pain, diarrhea, constipation, blood in stool, abdominal distention and anal bleeding.        GERD-controlled on PPI's.  Genitourinary: Negative for hematuria, vaginal bleeding and difficulty urinating.  Musculoskeletal: Negative for arthralgias.  Skin: Negative for rash and wound.  Neurological: Negative for seizures, syncope and headaches.  Hematological: Negative for adenopathy. Does not bruise/bleed easily.  Psychiatric/Behavioral: Negative for confusion.         Physical Exam   Constitutional: She is oriented to person, place, and time. She appears well-developed and well-nourished. No distress.  HENT:   Head: Normocephalic and atraumatic.   Nose: Nose normal.   Mouth/Throat: No oropharyngeal exudate.  Eyes: Conjunctivae and EOM are normal. Pupils are equal, round, and reactive to light. Left eye exhibits no discharge. No scleral icterus.  Neck:  Neck supple. No JVD present. No tracheal deviation present. No thyromegaly present.  Cardiovascular: Normal rate, regular rhythm, normal heart sounds and intact distal pulses.    No  murmur heard. Pulmonary/Chest: Effort normal and breath sounds normal. No respiratory distress. She has no wheezes. She has no rales. She exhibits no tenderness.    Breasts are large and somewhat ptotic. Small amount of bruising lateral right breast. There may be some tiny hematoma and thickening there  but no dominant mass. Curvilinear scar upper outer quadrant from remote biopsy. No other masses in either breast. No skin changes. No axillary adenopathy. Bra size is 42D per patient's report.  Abdominal: Soft. Bowel sounds are normal. She exhibits no distension and no mass. There is no tenderness. There is no rebound and no guarding.  Musculoskeletal: She exhibits no edema and no tenderness.  Lymphadenopathy:    She has no cervical adenopathy.  Neurological: She is alert and oriented to person, place, and time. She exhibits normal muscle tone. Coordination normal.  Skin: Skin is warm. No rash noted. She is not diaphoretic. No erythema. No pallor.  Psychiatric: She has a normal mood and affect. Her behavior is normal. Judgment and thought content normal.      Data Reviewed I have reviewed her imaging studies, pathology and breast diagnostic protocol. I have discussed her case in breast conference this morning. I have coordinated her treatment plan with Dr. Pablo Ledger and Dr. Grayland Ormond.   Assessment    Ductal carcinoma in situ, receptor positive, right breast. 2 separate areas at 9:00 position, one anteriorly and one posteriorly.    Clinical stage Tis, N0. However there is suspicion of invasion on one of the biopsies.   Barrett's esophagus     Plan    We had a long discussion about management of breast cancer in general, multidisciplinary approach. We had a long discussion about surgical options. We talked about a bracketed  double wire localized right  lumpectomy with sentinel node  biopsy. We talked about mastectomy and sentinel node biopsy, with or without reconstruction. I told her  I thought she was a reasonable candidate for lumpectomy.   i spent a long time discussing technique of  a double wire bracketed needle loc.  lumpectomy. I also spent a  long time discussing of the amount of tissue that would be removed and that the breast volume would be reduced which may or may not be significant in the long-term. I told her that she might need a reduction of the left to achieve  good symmetry if that was a  problem. We talked about what was involved with mastectomy, with or without reconstruction. At the end of the conversation she clearly desires  breast conservation if  possible.    She will be scheduled for a right breast partial mastectomy with bracketed double wire localization, right axillary sentinel node biopsy.   We discussed the indications, and details, techniques, and numerous risks of this operation. She is aware of the risk of bleeding, infection, reoperation for positive margins, reoperation for positive nodes, cosmetic deformity, and other unforeseen problems. All of her questions are answered. She understands these issues well. She agrees with this plan.          Edsel Petrin. Dalbert Batman, M.D., [facility] General and Minimally invasive Surgery Breast and Colorectal Surgery Office:   [phone] Pager:   [phone]

## 2014-01-20 ENCOUNTER — Encounter (HOSPITAL_BASED_OUTPATIENT_CLINIC_OR_DEPARTMENT_OTHER): Payer: BC Managed Care – PPO | Admitting: Anesthesiology

## 2014-01-20 ENCOUNTER — Encounter (HOSPITAL_COMMUNITY)
Admission: RE | Admit: 2014-01-20 | Discharge: 2014-01-20 | Disposition: A | Discharge status: Home / Self Care | Payer: BC Managed Care – PPO | Source: Ambulatory Visit | Attending: General Surgery | Admitting: General Surgery

## 2014-01-20 ENCOUNTER — Ambulatory Visit (HOSPITAL_BASED_OUTPATIENT_CLINIC_OR_DEPARTMENT_OTHER): Payer: BC Managed Care – PPO | Admitting: Anesthesiology

## 2014-01-20 ENCOUNTER — Encounter (HOSPITAL_BASED_OUTPATIENT_CLINIC_OR_DEPARTMENT_OTHER): Payer: Self-pay | Admitting: *Deleted

## 2014-01-20 ENCOUNTER — Ambulatory Visit (HOSPITAL_BASED_OUTPATIENT_CLINIC_OR_DEPARTMENT_OTHER)
Admission: RE | Admit: 2014-01-20 | Discharge: 2014-01-20 | Disposition: A | Discharge status: Home / Self Care | Payer: BC Managed Care – PPO | Source: Ambulatory Visit | Attending: General Surgery | Admitting: General Surgery

## 2014-01-20 ENCOUNTER — Encounter (HOSPITAL_BASED_OUTPATIENT_CLINIC_OR_DEPARTMENT_OTHER)
Admission: RE | Disposition: A | Discharge status: Home / Self Care | Payer: Self-pay | Source: Ambulatory Visit | Attending: General Surgery

## 2014-01-20 ENCOUNTER — Ambulatory Visit
Admission: RE | Admit: 2014-01-20 | Discharge: 2014-01-20 | Disposition: A | Discharge status: Home / Self Care | Payer: BC Managed Care – PPO | Source: Ambulatory Visit | Attending: General Surgery | Admitting: General Surgery

## 2014-01-20 DIAGNOSIS — M949 Disorder of cartilage, unspecified: Secondary | ICD-10-CM

## 2014-01-20 DIAGNOSIS — D0590 Unspecified type of carcinoma in situ of unspecified breast: Secondary | ICD-10-CM

## 2014-01-20 DIAGNOSIS — E039 Hypothyroidism, unspecified: Secondary | ICD-10-CM | POA: Insufficient documentation

## 2014-01-20 DIAGNOSIS — K2270 Barrett's esophagus without dysplasia: Secondary | ICD-10-CM | POA: Insufficient documentation

## 2014-01-20 DIAGNOSIS — K219 Gastro-esophageal reflux disease without esophagitis: Secondary | ICD-10-CM | POA: Insufficient documentation

## 2014-01-20 DIAGNOSIS — Z86718 Personal history of other venous thrombosis and embolism: Secondary | ICD-10-CM | POA: Insufficient documentation

## 2014-01-20 DIAGNOSIS — C50411 Malignant neoplasm of upper-outer quadrant of right female breast: Secondary | ICD-10-CM | POA: Diagnosis present

## 2014-01-20 DIAGNOSIS — M899 Disorder of bone, unspecified: Secondary | ICD-10-CM | POA: Insufficient documentation

## 2014-01-20 DIAGNOSIS — E785 Hyperlipidemia, unspecified: Secondary | ICD-10-CM | POA: Insufficient documentation

## 2014-01-20 HISTORY — PX: PARTIAL MASTECTOMY WITH NEEDLE LOCALIZATION AND AXILLARY SENTINEL LYMPH NODE BX: SHX6009

## 2014-01-20 LAB — POCT HEMOGLOBIN-HEMACUE: Hemoglobin: 12.7 g/dL (ref 12.0–15.0)

## 2014-01-20 SURGERY — PARTIAL MASTECTOMY WITH NEEDLE LOCALIZATION AND AXILLARY SENTINEL LYMPH NODE BX
Anesthesia: Regional | Site: Breast | Laterality: Right

## 2014-01-20 MED ORDER — MIDAZOLAM HCL 2 MG/2ML IJ SOLN
INTRAMUSCULAR | Status: AC
  Filled 2014-01-20: qty 2

## 2014-01-20 MED ORDER — DEXAMETHASONE SODIUM PHOSPHATE 4 MG/ML IJ SOLN
INTRAMUSCULAR | Status: DC | PRN
  Administered 2014-01-20: 10 mg via INTRAVENOUS

## 2014-01-20 MED ORDER — SODIUM CHLORIDE 0.9 % IJ SOLN
INTRAMUSCULAR | Status: AC
  Filled 2014-01-20: qty 10

## 2014-01-20 MED ORDER — ACETAMINOPHEN 650 MG RE SUPP: 650.0000 mg | RECTAL | Status: DC | PRN

## 2014-01-20 MED ORDER — LACTATED RINGERS IV SOLN
INTRAVENOUS | Status: DC
  Administered 2014-01-20 (×2): via INTRAVENOUS

## 2014-01-20 MED ORDER — FENTANYL CITRATE 0.05 MG/ML IJ SOLN
INTRAMUSCULAR | Status: AC
  Filled 2014-01-20: qty 6

## 2014-01-20 MED ORDER — CHLORHEXIDINE GLUCONATE 4 % EX LIQD: 1.0000 "application " | Freq: Once | CUTANEOUS | Status: DC

## 2014-01-20 MED ORDER — BUPIVACAINE-EPINEPHRINE (PF) 0.5% -1:200000 IJ SOLN
INTRAMUSCULAR | Status: DC | PRN
  Administered 2014-01-20: 30 mL via PERINEURAL

## 2014-01-20 MED ORDER — ONDANSETRON HCL 4 MG/2ML IJ SOLN: 4.0000 mg | Freq: Four times a day (QID) | INTRAMUSCULAR | Status: DC | PRN

## 2014-01-20 MED ORDER — OXYCODONE HCL 5 MG/5ML PO SOLN: 5.0000 mg | Freq: Once | ORAL | Status: AC | PRN

## 2014-01-20 MED ORDER — FENTANYL CITRATE 0.05 MG/ML IJ SOLN
INTRAMUSCULAR | Status: DC | PRN
  Administered 2014-01-20: 50 ug via INTRAVENOUS

## 2014-01-20 MED ORDER — HYDROMORPHONE HCL PF 1 MG/ML IJ SOLN
0.2500 mg | INTRAMUSCULAR | Status: DC | PRN
  Administered 2014-01-20 (×3): 0.25 mg via INTRAVENOUS

## 2014-01-20 MED ORDER — SODIUM CHLORIDE 0.9 % IV SOLN: 250.0000 mL | INTRAVENOUS | Status: DC | PRN

## 2014-01-20 MED ORDER — LIDOCAINE HCL (CARDIAC) 20 MG/ML IV SOLN
INTRAVENOUS | Status: DC | PRN
  Administered 2014-01-20: 100 mg via INTRAVENOUS

## 2014-01-20 MED ORDER — FENTANYL CITRATE 0.05 MG/ML IJ SOLN
INTRAMUSCULAR | Status: AC
  Filled 2014-01-20: qty 2

## 2014-01-20 MED ORDER — SODIUM CHLORIDE 0.9 % IJ SOLN: 3.0000 mL | INTRAMUSCULAR | Status: DC | PRN

## 2014-01-20 MED ORDER — ACETAMINOPHEN 325 MG PO TABS: 650.0000 mg | ORAL_TABLET | ORAL | Status: DC | PRN

## 2014-01-20 MED ORDER — METHYLENE BLUE 1 % INJ SOLN
INTRAMUSCULAR | Status: AC
  Filled 2014-01-20: qty 10

## 2014-01-20 MED ORDER — CEFAZOLIN SODIUM-DEXTROSE 2-3 GM-% IV SOLR: 2.0000 g | INTRAVENOUS | Status: DC

## 2014-01-20 MED ORDER — CEFAZOLIN SODIUM-DEXTROSE 2-3 GM-% IV SOLR
INTRAVENOUS | Status: AC
  Filled 2014-01-20: qty 50

## 2014-01-20 MED ORDER — FENTANYL CITRATE 0.05 MG/ML IJ SOLN
50.0000 ug | INTRAMUSCULAR | Status: DC | PRN
  Administered 2014-01-20: 100 ug via INTRAVENOUS

## 2014-01-20 MED ORDER — ONDANSETRON HCL 4 MG/2ML IJ SOLN: INTRAMUSCULAR | Status: DC | PRN

## 2014-01-20 MED ORDER — BUPIVACAINE-EPINEPHRINE (PF) 0.5% -1:200000 IJ SOLN
INTRAMUSCULAR | Status: AC
  Filled 2014-01-20: qty 30

## 2014-01-20 MED ORDER — OXYCODONE HCL 5 MG PO TABS: 5.0000 mg | ORAL_TABLET | ORAL | Status: DC | PRN

## 2014-01-20 MED ORDER — SODIUM CHLORIDE 0.9 % IJ SOLN
INTRAMUSCULAR | Status: DC | PRN
  Administered 2014-01-20: 12:00:00

## 2014-01-20 MED ORDER — TECHNETIUM TC 99M SULFUR COLLOID FILTERED
1.0000 | Freq: Once | INTRAVENOUS | Status: AC | PRN
  Administered 2014-01-20: 1 via INTRADERMAL

## 2014-01-20 MED ORDER — MIDAZOLAM HCL 2 MG/2ML IJ SOLN
1.0000 mg | INTRAMUSCULAR | Status: DC | PRN
  Administered 2014-01-20: 2 mg via INTRAVENOUS

## 2014-01-20 MED ORDER — PROPOFOL 10 MG/ML IV BOLUS
INTRAVENOUS | Status: DC | PRN
  Administered 2014-01-20: 200 mg via INTRAVENOUS

## 2014-01-20 MED ORDER — EPHEDRINE SULFATE 50 MG/ML IJ SOLN
INTRAMUSCULAR | Status: DC | PRN
  Administered 2014-01-20: 10 mg via INTRAVENOUS

## 2014-01-20 MED ORDER — OXYCODONE HCL 5 MG PO TABS
5.0000 mg | ORAL_TABLET | Freq: Once | ORAL | Status: AC | PRN
  Administered 2014-01-20: 5 mg via ORAL

## 2014-01-20 MED ORDER — SODIUM CHLORIDE 0.9 % IV SOLN: INTRAVENOUS | Status: DC

## 2014-01-20 MED ORDER — FENTANYL CITRATE 0.05 MG/ML IJ SOLN: 25.0000 ug | INTRAMUSCULAR | Status: DC | PRN

## 2014-01-20 MED ORDER — SODIUM CHLORIDE 0.9 % IJ SOLN: 3.0000 mL | Freq: Two times a day (BID) | INTRAMUSCULAR | Status: DC

## 2014-01-20 MED ORDER — HYDROCODONE-ACETAMINOPHEN 5-325 MG PO TABS: 1.0000 | ORAL_TABLET | Freq: Four times a day (QID) | ORAL | Status: DC | PRN

## 2014-01-20 MED ORDER — BUPIVACAINE-EPINEPHRINE 0.5% -1:200000 IJ SOLN
INTRAMUSCULAR | Status: DC | PRN
  Administered 2014-01-20: 10 mL

## 2014-01-20 MED ORDER — HYDROMORPHONE HCL PF 1 MG/ML IJ SOLN
INTRAMUSCULAR | Status: AC
  Filled 2014-01-20: qty 1

## 2014-01-20 MED ORDER — OXYCODONE HCL 5 MG PO TABS
ORAL_TABLET | ORAL | Status: AC
  Filled 2014-01-20: qty 1

## 2014-01-20 MED ORDER — ONDANSETRON HCL 4 MG/2ML IJ SOLN
INTRAMUSCULAR | Status: DC | PRN
  Administered 2014-01-20: 4 mg via INTRAVENOUS

## 2014-01-20 SURGICAL SUPPLY — 73 items
ADH SKN CLS APL DERMABOND .7 (GAUZE/BANDAGES/DRESSINGS) ×1
APL SKNCLS STERI-STRIP NONHPOA (GAUZE/BANDAGES/DRESSINGS)
APPLIER CLIP 11 MED OPEN (CLIP) ×2
APR CLP MED 11 20 MLT OPN (CLIP) ×1
BANDAGE ELASTIC 6 VELCRO ST LF (GAUZE/BANDAGES/DRESSINGS) IMPLANT
BENZOIN TINCTURE PRP APPL 2/3 (GAUZE/BANDAGES/DRESSINGS) IMPLANT
BINDER BREAST XLRG (GAUZE/BANDAGES/DRESSINGS) ×1 IMPLANT
BLADE HEX COATED 2.75 (ELECTRODE) ×2 IMPLANT
BLADE SURG 10 STRL SS (BLADE) IMPLANT
BLADE SURG 15 STRL LF DISP TIS (BLADE) ×2 IMPLANT
BLADE SURG 15 STRL SS (BLADE) ×4
CANISTER SUCT 1200ML W/VALVE (MISCELLANEOUS) ×2 IMPLANT
CHLORAPREP W/TINT 26ML (MISCELLANEOUS) ×2 IMPLANT
CLIP APPLIE 11 MED OPEN (CLIP) ×1 IMPLANT
COVER MAYO STAND STRL (DRAPES) ×2 IMPLANT
COVER PROBE W GEL 5X96 (DRAPES) ×2 IMPLANT
COVER TABLE BACK 60X90 (DRAPES) ×2 IMPLANT
DECANTER SPIKE VIAL GLASS SM (MISCELLANEOUS) IMPLANT
DERMABOND ADVANCED (GAUZE/BANDAGES/DRESSINGS) ×1
DERMABOND ADVANCED .7 DNX12 (GAUZE/BANDAGES/DRESSINGS) IMPLANT
DEVICE DUBIN W/COMP PLATE 8390 (MISCELLANEOUS) ×2 IMPLANT
DRAIN CHANNEL 19F RND (DRAIN) IMPLANT
DRAIN HEMOVAC 1/8 X 5 (WOUND CARE) IMPLANT
DRAPE LAPAROSCOPIC ABDOMINAL (DRAPES) ×2 IMPLANT
DRAPE UTILITY XL STRL (DRAPES) ×2 IMPLANT
DRSG PAD ABDOMINAL 8X10 ST (GAUZE/BANDAGES/DRESSINGS) IMPLANT
ELECT REM PT RETURN 9FT ADLT (ELECTROSURGICAL) ×2
ELECTRODE REM PT RTRN 9FT ADLT (ELECTROSURGICAL) ×1 IMPLANT
EVACUATOR SILICONE 100CC (DRAIN) IMPLANT
GAUZE SPONGE 4X4 12PLY STRL (GAUZE/BANDAGES/DRESSINGS) ×2 IMPLANT
GAUZE SPONGE 4X4 16PLY XRAY LF (GAUZE/BANDAGES/DRESSINGS) IMPLANT
GLOVE BIOGEL PI IND STRL 6.5 (GLOVE) IMPLANT
GLOVE BIOGEL PI IND STRL 7.5 (GLOVE) IMPLANT
GLOVE BIOGEL PI INDICATOR 6.5 (GLOVE) ×2
GLOVE BIOGEL PI INDICATOR 7.5 (GLOVE) ×1
GLOVE EUDERMIC 7 POWDERFREE (GLOVE) ×1 IMPLANT
GLOVE EXAM NITRILE EXT CUFF MD (GLOVE) ×1 IMPLANT
GLOVE SURG SS PI 6.5 STRL IVOR (GLOVE) ×2 IMPLANT
GLOVE SURG SS PI 7.0 STRL IVOR (GLOVE) ×2 IMPLANT
GOWN STRL REUS W/ TWL LRG LVL3 (GOWN DISPOSABLE) ×1 IMPLANT
GOWN STRL REUS W/ TWL XL LVL3 (GOWN DISPOSABLE) ×1 IMPLANT
GOWN STRL REUS W/TWL LRG LVL3 (GOWN DISPOSABLE) ×6
GOWN STRL REUS W/TWL XL LVL3 (GOWN DISPOSABLE) ×2
KIT MARKER MARGIN INK (KITS) ×2 IMPLANT
NDL HYPO 25X1 1.5 SAFETY (NEEDLE) ×2 IMPLANT
NDL SAFETY ECLIPSE 18X1.5 (NEEDLE) ×1 IMPLANT
NEEDLE HYPO 18GX1.5 SHARP (NEEDLE) ×2
NEEDLE HYPO 25X1 1.5 SAFETY (NEEDLE) ×4 IMPLANT
NS IRRIG 1000ML POUR BTL (IV SOLUTION) ×2 IMPLANT
PACK BASIN DAY SURGERY FS (CUSTOM PROCEDURE TRAY) ×2 IMPLANT
PAD ALCOHOL SWAB (MISCELLANEOUS) ×2 IMPLANT
PENCIL BUTTON HOLSTER BLD 10FT (ELECTRODE) ×2 IMPLANT
PIN SAFETY STERILE (MISCELLANEOUS) IMPLANT
SHEET MEDIUM DRAPE 40X70 STRL (DRAPES) ×2 IMPLANT
SLEEVE SCD COMPRESS KNEE MED (MISCELLANEOUS) ×1 IMPLANT
SPONGE GAUZE 4X4 12PLY STER LF (GAUZE/BANDAGES/DRESSINGS) IMPLANT
SPONGE LAP 18X18 X RAY DECT (DISPOSABLE) IMPLANT
SPONGE LAP 4X18 X RAY DECT (DISPOSABLE) ×3 IMPLANT
STRIP CLOSURE SKIN 1/2X4 (GAUZE/BANDAGES/DRESSINGS) IMPLANT
SUT ETHILON 3 0 FSL (SUTURE) IMPLANT
SUT MNCRL AB 4-0 PS2 18 (SUTURE) ×4 IMPLANT
SUT SILK 2 0 SH (SUTURE) ×2 IMPLANT
SUT VIC AB 2-0 CT1 27 (SUTURE)
SUT VIC AB 2-0 CT1 TAPERPNT 27 (SUTURE) IMPLANT
SUT VIC AB 3-0 SH 27 (SUTURE)
SUT VIC AB 3-0 SH 27X BRD (SUTURE) IMPLANT
SUT VICRYL 3-0 CR8 SH (SUTURE) ×3 IMPLANT
SYRINGE CONTROL L 12CC (SYRINGE) ×4 IMPLANT
SYRINGE CONTROL LL 12CC (SYRINGE) ×2 IMPLANT
TOWEL OR 17X24 6PK STRL BLUE (TOWEL DISPOSABLE) ×3 IMPLANT
TOWEL OR NON WOVEN STRL DISP B (DISPOSABLE) ×1 IMPLANT
TUBE CONNECTING 20X1/4 (TUBING) ×2 IMPLANT
YANKAUER SUCT BULB TIP NO VENT (SUCTIONS) ×2 IMPLANT

## 2014-01-20 NOTE — Op Note (Signed)
Patient Name:           Donna Manning   Date of Surgery:        01/20/2014.  Note: This dictation was prepared with Dragon/digital dictation along with [employer]. Any transcriptional errors that result from this process are unintentional.  Pre op Diagnosis:    Ductal carcinoma in situ, receptor positive, right breast. 2 separate areas at 9:00 position, one anteriorly and one posteriorly.  Clinical stage Tis, N0. However there is suspicion of invasion on one of the biopsies    Post op Diagnosis:    same  Procedure:                 Inject blue dye right breast Right partial mastectomy with double wire localization Right axillary sentinel node biopsy  Surgeon:                     Edsel Petrin. Dalbert Batman, M.D., FACS  Assistant:                      None  Operative Indications:   Donna Manning is a 63 y.o. female. She is referred by Dr. Lovey Newcomer at the breast center [facility] for evaluation and management of 2 separate areas of cancer in the lateral aspect of the right breast, receptor-positive DCIS with possible invasion. Her PCP is Dr. [name]. Her gynecologist is Dr. Idelle Leech. She was seen in the [facility] recently by Dr. Pablo Ledger, Dr. Cranford Mon, and me.  Her only prior history of breast disease is a right breast biopsy many years ago for benign problems. Never took hormones. . Recent screening mammograms and subsequent diagnostic mammograms revealed 2 separate groups of microcalcifications in the right breast at the 9:00 position. There was a 4 mm area of microcalcifications, somewhat heterogeneous in the anterior right breast 9:00 position. There is a 1.8 cm area of grouped and linear calcifications in the right breast, posteriorly. Both of these areas were biopsied. The anterior area shows ductal carcinoma in situ, there is a suspicion for invasion. The posterior area shows ductal carcinoma in situ,  variable grade. ER and PR are positive positive.  MRI shows  the 2 biopsy cavities but no other abnormalities anywhere. The distance from the outside edge of one biopsy cavity to the other is 6.3 cm.  Family history is negative for breast or ovarian cancer. She desires breast conservation, if technically feasible. Felt that we could most likely perform lumpectomy of these 2 areas since they were laterally placed and her breasts are relatively large. Postop radiation therapy is advised.  Sentinel lymph node biopsy is indicated because of possible invasion.   Operative Findings:       We were able to remove both areas through a radially oriented ellipse at about the 8:00 position. The specimen mammogram looked good containing the entirety of both wires and the marker clips and calcifications. It appeared that we had gone widely around this area. I found 2 sentinel lymph nodes and removed a third lymph node which was a non-sentinel lymph node.  Procedure in Detail:          The 2 localizing wires were placed by Dr. Glennon Mac at the breast center [facility]. They were both at about the 8:00 position one was more anterior and one was more posterior than the breast. The areas to be excised were 2 cm under the skin but the wires had gone past these  areas.      Prior to the procedure pectoral block was placed by anesthesia.      The patient was taken to the operating room and underwent general anesthesia with LMA device. The right breast had been previously injected with radionuclide by the nuclear medicine technician in the holding area. Surgical time out was performed. Intravenous antibiotics were given. Following alcohol prep I injected 5 cc of blue dye into the right breast, subareolar area. This was 2 cc of methylene blue mixed with 3 cc of saline. The breast was massaged for a few minutes      0.5% Marcaine with epinephrine was used as a local anesthetic in the skin and superficial subcutaneous tissues. I made a radial ellipse incision, very narrow to encompass both  wires at the 8:00 position of the right breast.   . Dissection was carried down into the breast tissue around the localizing wires. Specimen was removed and marked with silk sutures and the 6 color ink  kit. The specimen mammogram looked very good as described above. The specimen was marked and sent to the lab. Hemostasis was excellent and achieved with electrocautery. The wound was irrigated with saline. Metal clips were placed in the lumpectomy cavity at 5 cardinal positions to orient the radiation oncologist. The breast tissue was closed in multiple layers with interrupted sutures of 3-0 Vicryl. The skin was closed with a running subcuticular suture of 4-0 Monocryl and Dermabond.      Using the neoprobe I isolated an area in the right axilla which was very hot. Transverse incision was made at the hairline. Dissection was carried down into the axillary tissue. I found a slightly enlarged very blue,very hot sentinel lymph node. I then found a sentinal node although there was very hot but was not very blue. In the course of dissection I found one other lymph node that did not have any radioactivity. All 3 nodes were sent to the lab. Hemostasis in the axilla was excellent. The wound was irrigated with saline. The deeper tissues were closed with 3-0 Vicryl sutures and the skin closed with a running subcuticular suture of 4-0 Monocryl and Dermabond. Breast binder and ice pack were placed. The patient tolerated the procedure well was taken to PACU in stable condition. EBL 20 cc. Counts correct. Complications none.     Edsel Petrin. Dalbert Batman, M.D., FACS General and Minimally Invasive Surgery Breast and Colorectal Surgery  01/20/2014 12:42 PM

## 2014-01-20 NOTE — Interval H&P Note (Signed)
History and Physical Interval Note:  01/20/2014 10:55 AM  Donna Manning  has presented today for surgery, with the diagnosis of mutifocal cancer right breast  The goals and the various methods of treatment have been discussed with the patient and family. After consideration of risks, benefits and other options for treatment, the patient has consented to  Procedure(s): RIGHT PARTIAL MASTECTOMY WITH DOUBLE  NEEDLE LOCALIZATION (BRACKETED )AND AXILLARY SENTINEL LYMPH NODE BX (Right) as a surgical intervention .  The patient's history has been reviewed, patient examined today, no change in status, stable for surgery.  I have reviewed the patient's chart and labs.  Questions were answered to the patient's satisfaction.     [name]

## 2014-01-20 NOTE — Discharge Instructions (Signed)
[facility] °Office Phone Number [phone] ° °BREAST BIOPSY/ PARTIAL MASTECTOMY: POST OP INSTRUCTIONS ° °Always review your discharge instruction sheet given to you by the facility where your surgery was performed. ° °IF YOU HAVE DISABILITY OR FAMILY LEAVE FORMS, YOU MUST BRING THEM TO THE OFFICE FOR PROCESSING.  DO NOT GIVE THEM TO YOUR DOCTOR. ° °1. A prescription for pain medication may be given to you upon discharge.  Take your pain medication as prescribed, if needed.  If narcotic pain medicine is not needed, then you may take acetaminophen (Tylenol) or ibuprofen (Advil) as needed. °2. Take your usually prescribed medications unless otherwise directed °3. If you need a refill on your pain medication, please contact your pharmacy.  They will contact our office to request authorization.  Prescriptions will not be filled after 5pm or on week-ends. °4. You should eat very light the first 24 hours after surgery, such as soup, crackers, pudding, etc.  Resume your normal diet the day after surgery. °5. Most patients will experience some swelling and bruising in the breast.  Ice packs and a good support bra will help.  Swelling and bruising can take several days to resolve.  °6. It is common to experience some constipation if taking pain medication after surgery.  Increasing fluid intake and taking a stool softener will usually help or prevent this problem from occurring.  A mild laxative (Milk of Magnesia or Miralax) should be taken according to package directions if there are no bowel movements after 48 hours. °7. Unless discharge instructions indicate otherwise, you may remove your bandages 24-48 hours after surgery, and you may shower at that time.  You may have steri-strips (small skin tapes) in place directly over the incision.  These strips should be left on the skin for 7-10 days.  If your surgeon used skin glue on the incision, you may shower in 24 hours.  The glue will flake off over the  next 2-3 weeks.  Any sutures or staples will be removed at the office during your follow-up visit. °8. ACTIVITIES:  You may resume regular daily activities (gradually increasing) beginning the next day.  Wearing a good support bra or sports bra minimizes pain and swelling.  You may have sexual intercourse when it is comfortable. °a. You may drive when you no longer are taking prescription pain medication, you can comfortably wear a seatbelt, and you can safely maneuver your car and apply brakes. °b. RETURN TO WORK:  ______________________________________________________________________________________ °9. You should see your doctor in the office for a follow-up appointment approximately two weeks after your surgery.  Your doctor’s nurse will typically make your follow-up appointment when she calls you with your pathology report.  Expect your pathology report 2-3 business days after your surgery.  You may call to check if you do not hear from us after three days. °10. OTHER INSTRUCTIONS: _______________________________________________________________________________________________ _____________________________________________________________________________________________________________________________________ °_____________________________________________________________________________________________________________________________________ °_____________________________________________________________________________________________________________________________________ ° °WHEN TO CALL YOUR DOCTOR: °1. Fever over 101.0 °2. Nausea and/or vomiting. °3. Extreme swelling or bruising. °4. Continued bleeding from incision. °5. Increased pain, redness, or drainage from the incision. ° °The clinic staff is available to answer your questions during regular business hours.  Please don’t hesitate to call and ask to speak to one of the nurses for clinical concerns.  If you have a medical emergency, go to the nearest  emergency room or call 911.  A surgeon from [facility] is always on call at the hospital. ° °For further questions, please visit [email or web address]  ° ° °  Post Anesthesia Home Care Instructions ° °Activity: °Get plenty of rest for the remainder of the day. A responsible adult should stay with you for 24 hours following the procedure.  °For the next 24 hours, DO NOT: °-Drive a car °-Operate machinery °-Drink alcoholic beverages °-Take any medication unless instructed by your physician °-Make any legal decisions or sign important papers. ° °Meals: °Start with liquid foods such as gelatin or soup. Progress to regular foods as tolerated. Avoid greasy, spicy, heavy foods. If nausea and/or vomiting occur, drink only clear liquids until the nausea and/or vomiting subsides. Call your physician if vomiting continues. ° °Special Instructions/Symptoms: °Your throat may feel dry or sore from the anesthesia or the breathing tube placed in your throat during surgery. If this causes discomfort, gargle with warm salt water. The discomfort should disappear within 24 hours. ° °

## 2014-01-20 NOTE — Anesthesia Procedure Notes (Addendum)
Procedure Name: LMA Insertion Date/Time: 01/20/2014 11:27 AM Performed by: Melynda Ripple D Pre-anesthesia Checklist: Patient identified, Emergency Drugs available, Suction available and Patient being monitored Patient Re-evaluated:Patient Re-evaluated prior to inductionOxygen Delivery Method: Circle System Utilized Preoxygenation: Pre-oxygenation with 100% oxygen Intubation Type: IV induction Ventilation: Mask ventilation without difficulty LMA: LMA inserted LMA Size: 4.0 Number of attempts: 1 Airway Equipment and Method: bite block Placement Confirmation: positive ETCO2 Tube secured with: Tape Dental Injury: Teeth and Oropharynx as per pre-operative assessment     Anesthesia Regional Block:  Pectoralis block  Pre-Anesthetic Checklist: ,, timeout performed, Correct Patient, Correct Site, Correct Laterality, Correct Procedure, Correct Position, site marked, Risks and benefits discussed,  Surgical consent,  Pre-op evaluation,  At surgeon's request and post-op pain management  Laterality: Right  Prep: Maximum Sterile Barrier Precautions used and chloraprep       Needles:  Injection technique: Single-shot  Needle Type: Echogenic Needle     Needle Length: 9cm 9 cm Needle Gauge: 21 and 21 G    Additional Needles:  Procedures: ultrasound guided (picture in chart) Pectoralis block Narrative:  Start time: 01/20/2014 10:40 AM End time: 01/20/2014 10:53 AM Injection made incrementally with aspirations every 5 mL.  Performed by: Personally  Anesthesiologist: Dr Marcie Bal  Additional Notes: Pt tolerated the procedure well.

## 2014-01-20 NOTE — Anesthesia Postprocedure Evaluation (Signed)
Anesthesia Post Note  Patient: Donna Manning  Procedure(s) Performed: Procedure(s) (LRB): RIGHT PARTIAL MASTECTOMY WITH DOUBLE  NEEDLE LOCALIZATION (BRACKETED )AND AXILLARY SENTINEL LYMPH NODE BX (Right)  Anesthesia type: General  Patient location: PACU  Post pain: Pain level controlled and Adequate analgesia  Post assessment: Post-op Vital signs reviewed, Patient's Cardiovascular Status Stable, Respiratory Function Stable, Patent Airway and Pain level controlled  Last Vitals:  Filed Vitals:   01/20/14 1330  BP: 120/62  Pulse: 83  Temp:   Resp: 21    Post vital signs: Reviewed and stable  Level of consciousness: awake, alert  and oriented  Complications: No apparent anesthesia complications

## 2014-01-20 NOTE — Progress Notes (Signed)
  Assisted Dr. Hodierne with right, ultrasound guided, pectoralis block. Side rails up, monitors on throughout procedure. See vital signs in flow sheet. Tolerated Procedure well. 

## 2014-01-20 NOTE — Transfer of Care (Signed)
Immediate Anesthesia Transfer of Care Note  Patient: Donna Manning  Procedure(s) Performed: Procedure(s) with comments: RIGHT PARTIAL MASTECTOMY WITH DOUBLE  NEEDLE LOCALIZATION (BRACKETED )AND AXILLARY SENTINEL LYMPH NODE BX (Right) - And Axilla.  Patient Location: PACU  Anesthesia Type:General  Level of Consciousness: sedated  Airway & Oxygen Therapy: Patient Spontanous Breathing and Patient connected to face mask oxygen  Post-op Assessment: Report given to PACU RN and Post -op Vital signs reviewed and stable  Post vital signs: Reviewed and stable  Complications: No apparent anesthesia complications

## 2014-01-20 NOTE — Anesthesia Preprocedure Evaluation (Signed)
Anesthesia Evaluation  Patient identified by MRN, date of birth, ID band Patient awake    Reviewed: Allergy & Precautions, H&P , NPO status , Patient's Chart, lab work & pertinent test results  Airway Mallampati: II  Neck ROM: full    Dental   Pulmonary neg pulmonary ROS,          Cardiovascular negative cardio ROS      Neuro/Psych    GI/Hepatic GERD-  ,  Endo/Other  Hypothyroidism obese  Renal/GU      Musculoskeletal   Abdominal   Peds  Hematology   Anesthesia Other Findings   Reproductive/Obstetrics                           Anesthesia Physical Anesthesia Plan  ASA: II  Anesthesia Plan: General and Regional   Post-op Pain Management: MAC Combined w/ Regional for Post-op pain   Induction: Intravenous  Airway Management Planned: LMA  Additional Equipment:   Intra-op Plan:   Post-operative Plan: Extubation in OR  Informed Consent: I have reviewed the patients History and Physical, chart, labs and discussed the procedure including the risks, benefits and alternatives for the proposed anesthesia with the patient or authorized representative who has indicated his/her understanding and acceptance.     Plan Discussed with: CRNA, Anesthesiologist and Surgeon  Anesthesia Plan Comments:         Anesthesia Quick Evaluation

## 2014-01-21 ENCOUNTER — Encounter (HOSPITAL_BASED_OUTPATIENT_CLINIC_OR_DEPARTMENT_OTHER): Payer: Self-pay | Admitting: General Surgery

## 2014-01-21 NOTE — Addendum Note (Signed)
Addendum created 01/21/14 0945 by Tawni Millers, CRNA   Modules edited: Charges VN

## 2014-01-22 NOTE — Telephone Encounter (Signed)
Pathology report shows invasive cancer   , 2 foci, one 3.9 cm and another 0.25 cm. Invasive cancer is focally present at the superior margin. All 3 lymph nodes are negative.     I called Ms. Vandevelde and discussed this with her. She is having some pain but no swelling. I discussed the pathology in detail. I told her that I advised a reexcision of the superior margin. She understands this. I discussed with Tonya  in triage nursing who is going to call the patient and make an appointment for me to see her sometime before the end of this week to examine the breast wound and to discuss the reexcision surgery.   Edsel Petrin. Dalbert Batman, M.D., [facility] General and Minimally invasive Surgery Breast and Colorectal Surgery Office:   [phone] Pager:   [phone]

## 2014-01-23 ENCOUNTER — Ambulatory Visit (INDEPENDENT_AMBULATORY_CARE_PROVIDER_SITE_OTHER): Payer: BC Managed Care – PPO | Admitting: General Surgery

## 2014-01-23 ENCOUNTER — Encounter (INDEPENDENT_AMBULATORY_CARE_PROVIDER_SITE_OTHER): Payer: Self-pay | Admitting: General Surgery

## 2014-01-23 VITALS — BP 120/72 | HR 64 | Temp 98.1°F | Resp 16 | Ht 67.5 in | Wt 202.8 lb

## 2014-01-23 DIAGNOSIS — C50411 Malignant neoplasm of upper-outer quadrant of right female breast: Secondary | ICD-10-CM

## 2014-01-23 DIAGNOSIS — C50419 Malignant neoplasm of upper-outer quadrant of unspecified female breast: Secondary | ICD-10-CM

## 2014-01-23 NOTE — Patient Instructions (Signed)
Your breast and axillary wounds appear to be healing without any sign of any obvious complication. The pain you are having is not unusual.  There was no evidence of cancer spread to the lymph nodes.  You had two areas of cancer in the breast, as you know. There was cancer present at the superior margin. This will need to be excised to prevent local recurrence.  You will be scheduled for right partial mastectomy with limited reexcision of the superior margin.  We may or may not have to leave a drain in the wound.

## 2014-01-23 NOTE — Progress Notes (Signed)
Patient ID: Donna Manning, female   DOB: 08-25-1950, 63 y.o.   MRN: [id number] History: This patient underwent right partial mastectomy with double wire localization and right axillary sentinel node biopsy on 01/20/2014. 2 foci of invasive cancer was found, one at 3.9 cm and a second one 0.25 cm. Meds are negative. There was invasive cancer present at the superior margin.     She is having moderate pain but no obvious wound problems. I discussed her pathology report with her. I have discussed options and have advised reexcision of the superior margin before anything else is done. She is somewhat depressed about her pain and the need to go back for further surgery but she understands and agrees.  Exam: Right breast and axillary incisions are healing without any obvious complications. The tissues were relatively soft. No obvious hematoma or seroma. No skin necrosis. As expected, there is a small defect involving loss inferolaterally in the right breast. Because she is alert. No distress. Seems depressed.  Assessment: Multifocal invasive cancer right breast, lower outer quadrant, stage TII and 0. Invasive cancer is present at the superior margin  Plan: Wound care discussed Scheduled for right partial mastectomy with reexcision of superior margin Refer to medical oncology and radiation oncology postop I discussed the indications, details, techniques, and numerous risk of this surgery with her. I gave her a copy of the pathology report. 2 pictures of what I intend to do. All her questions are answered. She agrees with this plan.   Edsel Petrin. Dalbert Batman, M.D., [facility] General and Minimally invasive Surgery Breast and Colorectal Surgery Office:   [phone] Pager:   [phone]

## 2014-01-31 ENCOUNTER — Encounter (HOSPITAL_BASED_OUTPATIENT_CLINIC_OR_DEPARTMENT_OTHER): Payer: Self-pay | Admitting: *Deleted

## 2014-02-03 NOTE — H&P (Signed)
  History:  This patient underwent right partial mastectomy with double wire localization and right axillary sentinel node biopsy on 01/20/2014. 2 foci of invasive cancer was found, one at 3.9 cm and a second one 0.25 cm. Nodes are negative. There was invasive cancer present at the superior margin.  She is having moderate pain but no obvious wound problems. I discussed her pathology report with her. I have discussed options and have advised reexcision of the superior margin before anything else is done. She is somewhat depressed about her pain and the need to go back for further surgery but she understands and agrees.   Exam:  Right breast and axillary incisions are healing without any obvious complications. The tissues were relatively soft. No obvious hematoma or seroma. No skin necrosis. As expected, there is a small defect involving loss inferolaterally in the right breast. Because she is alert. No distress. Seems depressed.  Lungs clear to auscultation bilaterally  Heart regular rate and rhythm. No murmur.  Assessment:  Multifocal invasive cancer right breast, lower outer quadrant, stage TII and 0. Invasive cancer is present at the superior margin   Plan:  Wound care discussed  Scheduled for right partial mastectomy with reexcision of superior margin  Refer to medical oncology and radiation oncology postop  I discussed the indications, details, techniques, and numerous risk of this surgery with her. I gave her a copy of the pathology report. [employer] of what I intend to do. All her questions are answered. She agrees with this plan.    Edsel Petrin. Dalbert Batman, M.D., [facility]  General and Minimally invasive Surgery  Breast and Colorectal Surgery  Office: [phone]  Pager: [phone]

## 2014-02-05 ENCOUNTER — Encounter (HOSPITAL_BASED_OUTPATIENT_CLINIC_OR_DEPARTMENT_OTHER): Payer: Self-pay | Admitting: Certified Registered"

## 2014-02-05 ENCOUNTER — Ambulatory Visit (HOSPITAL_BASED_OUTPATIENT_CLINIC_OR_DEPARTMENT_OTHER): Payer: BC Managed Care – PPO | Admitting: Certified Registered"

## 2014-02-05 ENCOUNTER — Ambulatory Visit (HOSPITAL_BASED_OUTPATIENT_CLINIC_OR_DEPARTMENT_OTHER)
Admission: RE | Admit: 2014-02-05 | Discharge: 2014-02-05 | Disposition: A | Discharge status: Home / Self Care | Payer: BC Managed Care – PPO | Source: Ambulatory Visit | Attending: General Surgery | Admitting: General Surgery

## 2014-02-05 ENCOUNTER — Encounter (HOSPITAL_BASED_OUTPATIENT_CLINIC_OR_DEPARTMENT_OTHER)
Admission: RE | Disposition: A | Discharge status: Home / Self Care | Payer: Self-pay | Source: Ambulatory Visit | Attending: General Surgery

## 2014-02-05 ENCOUNTER — Encounter (HOSPITAL_BASED_OUTPATIENT_CLINIC_OR_DEPARTMENT_OTHER): Payer: BC Managed Care – PPO | Admitting: Certified Registered"

## 2014-02-05 DIAGNOSIS — N6029 Fibroadenosis of unspecified breast: Secondary | ICD-10-CM

## 2014-02-05 DIAGNOSIS — E039 Hypothyroidism, unspecified: Secondary | ICD-10-CM | POA: Insufficient documentation

## 2014-02-05 DIAGNOSIS — C50519 Malignant neoplasm of lower-outer quadrant of unspecified female breast: Secondary | ICD-10-CM | POA: Insufficient documentation

## 2014-02-05 DIAGNOSIS — K219 Gastro-esophageal reflux disease without esophagitis: Secondary | ICD-10-CM | POA: Insufficient documentation

## 2014-02-05 DIAGNOSIS — C50411 Malignant neoplasm of upper-outer quadrant of right female breast: Secondary | ICD-10-CM

## 2014-02-05 DIAGNOSIS — N6089 Other benign mammary dysplasias of unspecified breast: Secondary | ICD-10-CM

## 2014-02-05 DIAGNOSIS — D0590 Unspecified type of carcinoma in situ of unspecified breast: Secondary | ICD-10-CM

## 2014-02-05 HISTORY — DX: Hypothyroidism, unspecified: E03.9

## 2014-02-05 HISTORY — DX: Personal history of other venous thrombosis and embolism: Z86.718

## 2014-02-05 HISTORY — PX: RE-EXCISION OF BREAST CANCER,SUPERIOR MARGINS: SHX6047

## 2014-02-05 HISTORY — DX: Dental restoration status: Z98.811

## 2014-02-05 SURGERY — RE-EXCISION OF BREAST CANCER,SUPERIOR MARGINS
Anesthesia: General | Site: Breast | Laterality: Right

## 2014-02-05 MED ORDER — CEFAZOLIN SODIUM-DEXTROSE 2-3 GM-% IV SOLR
INTRAVENOUS | Status: AC
  Filled 2014-02-05: qty 50

## 2014-02-05 MED ORDER — CEFAZOLIN SODIUM-DEXTROSE 2-3 GM-% IV SOLR
2.0000 g | INTRAVENOUS | Status: AC
  Administered 2014-02-05: 2 g via INTRAVENOUS

## 2014-02-05 MED ORDER — HYDROCODONE-ACETAMINOPHEN 5-325 MG PO TABS: 1.0000 | ORAL_TABLET | Freq: Four times a day (QID) | ORAL | Status: DC | PRN

## 2014-02-05 MED ORDER — BUPIVACAINE-EPINEPHRINE 0.5% -1:200000 IJ SOLN
INTRAMUSCULAR | Status: DC | PRN
  Administered 2014-02-05: 14 mL

## 2014-02-05 MED ORDER — FENTANYL CITRATE 0.05 MG/ML IJ SOLN
INTRAMUSCULAR | Status: DC | PRN
  Administered 2014-02-05: 50 ug via INTRAVENOUS

## 2014-02-05 MED ORDER — LIDOCAINE HCL (CARDIAC) 20 MG/ML IV SOLN
INTRAVENOUS | Status: DC | PRN
  Administered 2014-02-05: 30 mg via INTRAVENOUS

## 2014-02-05 MED ORDER — MIDAZOLAM HCL 2 MG/ML PO SYRP: 12.0000 mg | ORAL_SOLUTION | Freq: Once | ORAL | Status: DC | PRN

## 2014-02-05 MED ORDER — FENTANYL CITRATE 0.05 MG/ML IJ SOLN: 50.0000 ug | INTRAMUSCULAR | Status: DC | PRN

## 2014-02-05 MED ORDER — LACTATED RINGERS IV SOLN
INTRAVENOUS | Status: DC
  Administered 2014-02-05 (×2): via INTRAVENOUS

## 2014-02-05 MED ORDER — PROPOFOL 10 MG/ML IV BOLUS
INTRAVENOUS | Status: DC | PRN
  Administered 2014-02-05: 200 mg via INTRAVENOUS

## 2014-02-05 MED ORDER — FENTANYL CITRATE 0.05 MG/ML IJ SOLN
INTRAMUSCULAR | Status: AC
  Filled 2014-02-05: qty 6

## 2014-02-05 MED ORDER — PROPOFOL 10 MG/ML IV EMUL
INTRAVENOUS | Status: AC
  Filled 2014-02-05: qty 50

## 2014-02-05 MED ORDER — HYDROMORPHONE HCL PF 1 MG/ML IJ SOLN
0.5000 mg | INTRAMUSCULAR | Status: DC | PRN
  Administered 2014-02-05: 0.5 mg via INTRAVENOUS

## 2014-02-05 MED ORDER — DEXAMETHASONE SODIUM PHOSPHATE 4 MG/ML IJ SOLN
INTRAMUSCULAR | Status: DC | PRN
  Administered 2014-02-05: 10 mg via INTRAVENOUS

## 2014-02-05 MED ORDER — METOCLOPRAMIDE HCL 5 MG/ML IJ SOLN: 10.0000 mg | Freq: Once | INTRAMUSCULAR | Status: DC | PRN

## 2014-02-05 MED ORDER — FENTANYL CITRATE 0.05 MG/ML IJ SOLN: 25.0000 ug | INTRAMUSCULAR | Status: DC | PRN

## 2014-02-05 MED ORDER — CHLORHEXIDINE GLUCONATE 4 % EX LIQD: 1.0000 "application " | Freq: Once | CUTANEOUS | Status: DC

## 2014-02-05 MED ORDER — ONDANSETRON HCL 4 MG/2ML IJ SOLN
INTRAMUSCULAR | Status: DC | PRN
  Administered 2014-02-05: 4 mg via INTRAVENOUS

## 2014-02-05 MED ORDER — HYDROMORPHONE HCL PF 1 MG/ML IJ SOLN
INTRAMUSCULAR | Status: AC
  Filled 2014-02-05: qty 1

## 2014-02-05 MED ORDER — MIDAZOLAM HCL 2 MG/2ML IJ SOLN: 1.0000 mg | INTRAMUSCULAR | Status: DC | PRN

## 2014-02-05 SURGICAL SUPPLY — 61 items
ADH SKN CLS APL DERMABOND .7 (GAUZE/BANDAGES/DRESSINGS) ×1
APL SKNCLS STERI-STRIP NONHPOA (GAUZE/BANDAGES/DRESSINGS)
APPLIER CLIP 9.375 MED OPEN (MISCELLANEOUS) ×2
APR CLP MED 9.3 20 MLT OPN (MISCELLANEOUS) ×1
BANDAGE ELASTIC 6 VELCRO ST LF (GAUZE/BANDAGES/DRESSINGS) IMPLANT
BENZOIN TINCTURE PRP APPL 2/3 (GAUZE/BANDAGES/DRESSINGS) IMPLANT
BINDER BREAST XXLRG (GAUZE/BANDAGES/DRESSINGS) ×1 IMPLANT
BLADE HEX COATED 2.75 (ELECTRODE) ×2 IMPLANT
BLADE SURG 15 STRL LF DISP TIS (BLADE) ×2 IMPLANT
BLADE SURG 15 STRL SS (BLADE) ×2
CANISTER SUCT 1200ML W/VALVE (MISCELLANEOUS) ×2 IMPLANT
CHLORAPREP W/TINT 26ML (MISCELLANEOUS) ×2 IMPLANT
CLIP APPLIE 9.375 MED OPEN (MISCELLANEOUS) ×1 IMPLANT
COVER MAYO STAND STRL (DRAPES) ×2 IMPLANT
COVER TABLE BACK 60X90 (DRAPES) ×2 IMPLANT
DECANTER SPIKE VIAL GLASS SM (MISCELLANEOUS) IMPLANT
DERMABOND ADVANCED (GAUZE/BANDAGES/DRESSINGS) ×1
DERMABOND ADVANCED .7 DNX12 (GAUZE/BANDAGES/DRESSINGS) IMPLANT
DRAPE LAPAROSCOPIC ABDOMINAL (DRAPES) ×1 IMPLANT
DRAPE LAPAROTOMY TRNSV 102X78 (DRAPE) IMPLANT
DRAPE PED LAPAROTOMY (DRAPES) ×1 IMPLANT
DRAPE UTILITY XL STRL (DRAPES) ×2 IMPLANT
ELECT REM PT RETURN 9FT ADLT (ELECTROSURGICAL) ×2
ELECTRODE REM PT RTRN 9FT ADLT (ELECTROSURGICAL) ×1 IMPLANT
GAUZE SPONGE 4X4 16PLY XRAY LF (GAUZE/BANDAGES/DRESSINGS) IMPLANT
GLOVE EUDERMIC 7 POWDERFREE (GLOVE) ×4 IMPLANT
GOWN STRL REUS W/ TWL LRG LVL3 (GOWN DISPOSABLE) ×1 IMPLANT
GOWN STRL REUS W/ TWL XL LVL3 (GOWN DISPOSABLE) ×1 IMPLANT
GOWN STRL REUS W/TWL LRG LVL3 (GOWN DISPOSABLE) ×4
GOWN STRL REUS W/TWL XL LVL3 (GOWN DISPOSABLE)
KIT MARKER MARGIN INK (KITS) ×1 IMPLANT
NDL HYPO 25X1 1.5 SAFETY (NEEDLE) ×1 IMPLANT
NEEDLE HYPO 22GX1.5 SAFETY (NEEDLE) IMPLANT
NEEDLE HYPO 25X1 1.5 SAFETY (NEEDLE) ×2 IMPLANT
NS IRRIG 1000ML POUR BTL (IV SOLUTION) ×2 IMPLANT
PACK BASIN DAY SURGERY FS (CUSTOM PROCEDURE TRAY) ×2 IMPLANT
PENCIL BUTTON HOLSTER BLD 10FT (ELECTRODE) ×2 IMPLANT
SLEEVE SCD COMPRESS KNEE MED (MISCELLANEOUS) ×2 IMPLANT
SPONGE GAUZE 4X4 12PLY STER LF (GAUZE/BANDAGES/DRESSINGS) ×1 IMPLANT
SPONGE LAP 4X18 X RAY DECT (DISPOSABLE) ×2 IMPLANT
STAPLER VISISTAT 35W (STAPLE) IMPLANT
STRIP CLOSURE SKIN 1/2X4 (GAUZE/BANDAGES/DRESSINGS) IMPLANT
SUT ETHILON 4 0 PS 2 18 (SUTURE) IMPLANT
SUT MNCRL AB 4-0 PS2 18 (SUTURE) ×1 IMPLANT
SUT SILK 2 0 SH (SUTURE) ×2 IMPLANT
SUT VIC AB 2-0 CT1 27 (SUTURE) ×4
SUT VIC AB 2-0 CT1 TAPERPNT 27 (SUTURE) IMPLANT
SUT VIC AB 2-0 SH 27 (SUTURE)
SUT VIC AB 2-0 SH 27XBRD (SUTURE) IMPLANT
SUT VIC AB 3-0 FS2 27 (SUTURE) IMPLANT
SUT VIC AB 4-0 P-3 18XBRD (SUTURE) IMPLANT
SUT VIC AB 4-0 P3 18 (SUTURE)
SUT VICRYL 3-0 CR8 SH (SUTURE) ×2 IMPLANT
SUT VICRYL 4-0 PS2 18IN ABS (SUTURE) IMPLANT
SYR BULB 3OZ (MISCELLANEOUS) ×1 IMPLANT
SYRINGE CONTROL L 12CC (SYRINGE) ×2 IMPLANT
SYRINGE CONTROL LL 12CC (SYRINGE) ×1 IMPLANT
TAPE HYPAFIX 4 X10 (GAUZE/BANDAGES/DRESSINGS) IMPLANT
TOWEL OR NON WOVEN STRL DISP B (DISPOSABLE) ×2 IMPLANT
TUBE CONNECTING 20X1/4 (TUBING) ×2 IMPLANT
YANKAUER SUCT BULB TIP NO VENT (SUCTIONS) ×2 IMPLANT

## 2014-02-05 NOTE — Op Note (Signed)
Patient Name:           Donna Manning   Date of Surgery:        02/05/2014  Note: This dictation was prepared with Dragon/digital dictation along with [facility] technology. Any transcriptional errors that result from this process are unintentional.  Pre op Diagnosis:     Multifocal invasive cancer right breast, lower outer quadrant, stage T2, N0. Invasive cancer is present at the superior margin     Post op Diagnosis:    Same  Procedure:                 Right partial mastectomy, reexcision superior margins, assessment of margins  Surgeon:                     Edsel Petrin. Dalbert Batman, M.D., FACS  Assistant:                      None  Operative Indications:   This patient underwent right partial mastectomy with double wire localization and right axillary sentinel node biopsy on 01/20/2014.   Two foci of invasive cancer was found, one was 3.9 cm and a second one 0.25 cm. Nodes are negative. ER/PR strongly positive, Her-2 negative.   There was invasive cancer present at the superior margin.   I discussed her pathology report with her. I have discussed options and have advised reexcision of the superior margin before anything else is done. She  understands and agrees.    Operative Findings:       The lumpectomy cavity was clean with no evidence of infection. There was no gross tumor found. I removed a 1 cm thick by 6 cm transversely by 4 cm sagittally area of breast tissue, re excising the superior margin. This was marked with the ink kit.   Procedure in Detail:          Following the induction of general LMA anesthesia the patient's right breast was prepped and draped in a sterile fashion. The usual surgical time out was performed and intravenous antibiotics were given. 0.5 % marcaine with epinephrine was used as a local infiltration anesthetic. The radially oriented incision in the lower outer quadrant of the right breast was reopened. I carefully took the dissection down through several layers  dividing the  Vicryl sutures until I had the lumpectomy cavity completely open. There was no gross tumor or seroma. Using electrocautery I excised a large area of the superior margin as described above. This was removed and then marked with the ink kit to mark the new margins. This was sent to the lab. Hemostasis was excellent and achieved with electrocautery. The wound was irrigated copiously. The breast tissue was closed in several layers with interrupted 2-0 Vicryl and 3-0 Vicryl and the skin closed with a running subcuticular suture of 4-0 Monocryl and Dermabond. Breast binder was placed and the patient taken to PACU in stable condition. EBL 15 cc. Counts correct. Complications none.     Edsel Petrin. Dalbert Batman, M.D., FACS General and Minimally Invasive Surgery Breast and Colorectal Surgery  02/05/2014 10:02 AM

## 2014-02-05 NOTE — Transfer of Care (Signed)
Immediate Anesthesia Transfer of Care Note  Patient: Donna Manning  Procedure(s) Performed: Procedure(s): RIGHT LUMPECTOMY, RE-EXCISION OF BREAST CANCER,SUPERIOR MARGINS (Right)  Patient Location: PACU  Anesthesia Type:General  Level of Consciousness: awake, alert , oriented and patient cooperative  Airway & Oxygen Therapy: Patient Spontanous Breathing and Patient connected to face mask oxygen  Post-op Assessment: Report given to PACU RN and Post -op Vital signs reviewed and stable  Post vital signs: Reviewed and stable  Complications: No apparent anesthesia complications

## 2014-02-05 NOTE — Interval H&P Note (Signed)
History and Physical Interval Note:  02/05/2014 8:39 AM  Donna Manning  has presented today for surgery, with the diagnosis of right breast cancer  The goals and the various methods of treatment have been discussed with the patient and family. After consideration of risks, benefits and other options for treatment, the patient has consented to  Procedure(s): RIGHT LUMPECTOMY, RE-EXCISION OF BREAST CANCER,SUPERIOR MARGINS (Right) as a surgical intervention .  The patient's history has been reviewed, patient examined today,  no change in status, stable for surgery.  I have reviewed the patient's chart and labs.  Questions were answered to the patient's satisfaction.     [name]

## 2014-02-05 NOTE — Anesthesia Postprocedure Evaluation (Signed)
Anesthesia Post Note  Patient: Donna Manning  Procedure(s) Performed: Procedure(s) (LRB): RIGHT LUMPECTOMY, RE-EXCISION OF BREAST CANCER,SUPERIOR MARGINS (Right)  Anesthesia type: General  Patient location: PACU  Post pain: Pain level controlled  Post assessment: Patient's Cardiovascular Status Stable  Last Vitals:  Filed Vitals:   02/05/14 1030  BP:   Pulse: 64  Temp:   Resp: 14    Post vital signs: Reviewed and stable  Level of consciousness: alert  Complications: No apparent anesthesia complications

## 2014-02-05 NOTE — Discharge Instructions (Signed)
[facility] °Office Phone Number [phone] ° °BREAST BIOPSY/ PARTIAL MASTECTOMY: POST OP INSTRUCTIONS ° °Always review your discharge instruction sheet given to you by the facility where your surgery was performed. ° °IF YOU HAVE DISABILITY OR FAMILY LEAVE FORMS, YOU MUST BRING THEM TO THE OFFICE FOR PROCESSING.  DO NOT GIVE THEM TO YOUR DOCTOR. ° °1. A prescription for pain medication may be given to you upon discharge.  Take your pain medication as prescribed, if needed.  If narcotic pain medicine is not needed, then you may take acetaminophen (Tylenol) or ibuprofen (Advil) as needed. °2. Take your usually prescribed medications unless otherwise directed °3. If you need a refill on your pain medication, please contact your pharmacy.  They will contact our office to request authorization.  Prescriptions will not be filled after 5pm or on week-ends. °4. You should eat very light the first 24 hours after surgery, such as soup, crackers, pudding, etc.  Resume your normal diet the day after surgery. °5. Most patients will experience some swelling and bruising in the breast.  Ice packs and a good support bra will help.  Swelling and bruising can take several days to resolve.  °6. It is common to experience some constipation if taking pain medication after surgery.  Increasing fluid intake and taking a stool softener will usually help or prevent this problem from occurring.  A mild laxative (Milk of Magnesia or Miralax) should be taken according to package directions if there are no bowel movements after 48 hours. °7. Unless discharge instructions indicate otherwise, you may remove your bandages 24-48 hours after surgery, and you may shower at that time.  You may have steri-strips (small skin tapes) in place directly over the incision.  These strips should be left on the skin for 7-10 days.  If your surgeon used skin glue on the incision, you may shower in 24 hours.  The glue will flake off over the  next 2-3 weeks.  Any sutures or staples will be removed at the office during your follow-up visit. °8. ACTIVITIES:  You may resume regular daily activities (gradually increasing) beginning the next day.  Wearing a good support bra or sports bra minimizes pain and swelling.  You may have sexual intercourse when it is comfortable. °a. You may drive when you no longer are taking prescription pain medication, you can comfortably wear a seatbelt, and you can safely maneuver your car and apply brakes. °b. RETURN TO WORK:  ______________________________________________________________________________________ °9. You should see your doctor in the office for a follow-up appointment approximately two weeks after your surgery.  Your doctor’s nurse will typically make your follow-up appointment when she calls you with your pathology report.  Expect your pathology report 2-3 business days after your surgery.  You may call to check if you do not hear from us after three days. °10. OTHER INSTRUCTIONS: _______________________________________________________________________________________________ _____________________________________________________________________________________________________________________________________ °_____________________________________________________________________________________________________________________________________ °_____________________________________________________________________________________________________________________________________ ° °WHEN TO CALL YOUR DOCTOR: °1. Fever over 101.0 °2. Nausea and/or vomiting. °3. Extreme swelling or bruising. °4. Continued bleeding from incision. °5. Increased pain, redness, or drainage from the incision. ° °The clinic staff is available to answer your questions during regular business hours.  Please don’t hesitate to call and ask to speak to one of the nurses for clinical concerns.  If you have a medical emergency, go to the nearest  emergency room or call 911.  A surgeon from [facility] is always on call at the hospital. ° °For further questions, please visit [email or web address]  ° ° °  Post Anesthesia Home Care Instructions ° °Activity: °Get plenty of rest for the remainder of the day. A responsible adult should stay with you for 24 hours following the procedure.  °For the next 24 hours, DO NOT: °-Drive a car °-Operate machinery °-Drink alcoholic beverages °-Take any medication unless instructed by your physician °-Make any legal decisions or sign important papers. ° °Meals: °Start with liquid foods such as gelatin or soup. Progress to regular foods as tolerated. Avoid greasy, spicy, heavy foods. If nausea and/or vomiting occur, drink only clear liquids until the nausea and/or vomiting subsides. Call your physician if vomiting continues. ° °Special Instructions/Symptoms: °Your throat may feel dry or sore from the anesthesia or the breathing tube placed in your throat during surgery. If this causes discomfort, gargle with warm salt water. The discomfort should disappear within 24 hours. ° °

## 2014-02-05 NOTE — Anesthesia Preprocedure Evaluation (Signed)
Anesthesia Evaluation  Patient identified by MRN, date of birth, ID band Patient awake    Reviewed: Allergy & Precautions, H&P , NPO status , Patient's Chart, lab work & pertinent test results, reviewed documented beta blocker date and time   Airway Mallampati: II TM Distance: >3 FB Neck ROM: full    Dental   Pulmonary neg pulmonary ROS,  breath sounds clear to auscultation        Cardiovascular negative cardio ROS  Rhythm:regular     Neuro/Psych negative neurological ROS  negative psych ROS   GI/Hepatic Neg liver ROS, GERD-  Medicated and Controlled,  Endo/Other  Hypothyroidism   Renal/GU negative Renal ROS  negative genitourinary   Musculoskeletal   Abdominal   Peds  Hematology negative hematology ROS (+)   Anesthesia Other Findings See surgeon's H&P   Reproductive/Obstetrics negative OB ROS                           Anesthesia Physical Anesthesia Plan  ASA: II  Anesthesia Plan: General   Post-op Pain Management:    Induction: Intravenous  Airway Management Planned: LMA  Additional Equipment:   Intra-op Plan:   Post-operative Plan:   Informed Consent: I have reviewed the patients History and Physical, chart, labs and discussed the procedure including the risks, benefits and alternatives for the proposed anesthesia with the patient or authorized representative who has indicated his/her understanding and acceptance.   Dental Advisory Given  Plan Discussed with: CRNA and Surgeon  Anesthesia Plan Comments:         Anesthesia Quick Evaluation

## 2014-02-06 ENCOUNTER — Ambulatory Visit: Payer: BC Managed Care – PPO | Admitting: Radiation Oncology

## 2014-02-06 ENCOUNTER — Encounter (HOSPITAL_BASED_OUTPATIENT_CLINIC_OR_DEPARTMENT_OTHER): Payer: Self-pay | Admitting: General Surgery

## 2014-02-06 ENCOUNTER — Telehealth (INDEPENDENT_AMBULATORY_CARE_PROVIDER_SITE_OTHER): Payer: Self-pay

## 2014-02-06 ENCOUNTER — Other Ambulatory Visit (INDEPENDENT_AMBULATORY_CARE_PROVIDER_SITE_OTHER): Payer: Self-pay

## 2014-02-06 ENCOUNTER — Ambulatory Visit: Payer: BC Managed Care – PPO

## 2014-02-06 DIAGNOSIS — D0591 Unspecified type of carcinoma in situ of right breast: Secondary | ICD-10-CM

## 2014-02-06 NOTE — Telephone Encounter (Signed)
Patient aware of negative path results and she will be scheduled with Med Onc/Rad Onc per Dr Dalbert Batman ,Order has is in [place]

## 2014-02-06 NOTE — Progress Notes (Signed)
Quick Note:  Inform patient of Pathology report,.Tell her that no residual cancer was found. This is excellent news. She will not need any further surgery.  She needs to be referred to radiation oncology and medical oncology immediately.  hmi ______

## 2014-02-11 ENCOUNTER — Encounter: Payer: Self-pay | Admitting: Hematology and Oncology

## 2014-02-11 NOTE — Progress Notes (Signed)
Patient inquired about the [employer] and how it works. I advised once she starts treatment proof of income will need to be bought in.

## 2014-02-13 NOTE — Progress Notes (Signed)
Location of Breast Cancer:Right upper-outer quadrant.@ areas at 9:00 o'clock position anterior ly and posterioly  Histology per Pathology Report:02/05/2014 Diagnosis Breast, lumpectomy, right, re-excision superior margin - FIBROCYSTIC CHANGES WDiagnosis 1. Breast, partial mastectomy, right - INVASIVE DUCTAL CARCINOMA, GRADE I/III, TWO FOCI, SPANNING 3.9 CM AND 0.25 CM. - DUCTAL CARCINOMA IN SITU, INTERMEDIATE GRADE. - LOBULAR NEOPLASIA (ATYPICAL LOBULAR HYPERPLASIA). - INVASIVE CARCINOMA IS FOCALLY PRESENT AT THE SUPERIOR MARGIN. - SEE ONCOLOGY TABLE BELOW. 2. Lymph node, sentinel, biopsy, right axilla # 1 - THERE IS NO EVIDENCE OF CARCINOMA IN 1 OF 1 LYMPH NODE (0/1). 3. Lymph node, sentinel, biopsy, right axilla # 2 - THERE IS NO EVIDENCE OF CARCINOMA IN 1 OF 1 LYMPH NODE (0/1). 4. Lymph node, biopsy, right axilla # 3 - THERE IS NO EVIDENCE OF CARCINOMA IN 1 OF 1 LYMPH NODE (0/1).ITH USUAL DUCTAL HYPERPLASIA AND ADENOSIS. - HEALING BIOPSY SITE.  01/20/2014 Diagnosis 1. Breast, partial mastectomy, right - INVASIVE DUCTAL CARCINOMA, GRADE I/III, TWO FOCI, SPANNING 3.9 CM AND 0.25 CM. - DUCTAL CARCINOMA IN SITU, INTERMEDIATE GRADE. - LOBULAR NEOPLASIA (ATYPICAL LOBULAR HYPERPLASIA). - INVASIVE CARCINOMA IS FOCALLY PRESENT AT THE SUPERIOR MARGIN. - SEE ONCOLOGY TABLE BELOW. 2. Lymph node, sentinel, biopsy, right axilla # 1 - THERE IS NO EVIDENCE OF CARCINOMA IN 1 OF 1 LYMPH NODE (0/1). 3. Lymph node, sentinel, biopsy, right axilla # 2 - THERE IS NO EVIDENCE OF CARCINOMA IN 1 OF 1 LYMPH NODE (0/1). 4. Lymph node, biopsy, right axilla # 3 - THERE IS NO EVIDENCE OF CARCINOMA IN 1 OF 1 LYMPH NODE (0/1).  12/24/2013 Diagnosis 1. Breast, right, needle core biopsy, posterior lateral - DUCTAL CARCINOMA IN SITU WITH CALCIFICATIONS. - SEE COMMENT. 2. Breast, right, needle core biopsy, anterior lateral - ATYPICAL DUCTAL HYPERPLASIA WITH CALCIFICATIONS. - SEE COMMENT. 3. Breast, right, needle  core biopsy, anterior lateral - DUCTAL CARCINOMA IN SITU WITH CALCIFICATIONS. - MICROSCOPIC FOCUS HIGHLY SUSPICIOUS FOR STROMAL INVASION. - FIBROCYSTIC CHANGES WITH CALCIFICATIONS. - SEE COMMENT.    Receptor Status: ER(+), PR (+), Her2-neu ()  Did patient present with symptoms (if so, please note symptoms) or was this found on screening mammography?:Mammography.  Past/Anticipated interventions by surgeon, if any:02/05/2014 RE-EXCISION OF BREAST CANCER,SUPERIOR MARGINS  01/20/2014 PARTIAL MASTECTOMY WITH NEEDLE LOCALIZATION AND AXILLARY SENTINEL LYMPH NODE by Dr. Dalbert Batman   Past/Anticipated interventions by medical oncology, if any: Chemotherapy. No  Lymphedema issues, if any:No   Pain issues, if any: mild right breast  SAFETY ISSUES:  Prior radiation?No  Pacemaker/ICD? No  Possible current pregnancy?No  Is the patient on methotrexate?No  Current Complaints / other details:Married. Family h/o negative for breast and female organ cancer. Menses12 Last menstraul cycle age 22 GQQ:PYPP Age for first live birth age 82, 5 children Allergies/Intolerance:contrast dye, starch and actonel    Donna Manning, Donna Drown, RN 02/13/2014,10:31 AM

## 2014-02-17 ENCOUNTER — Ambulatory Visit (INDEPENDENT_AMBULATORY_CARE_PROVIDER_SITE_OTHER): Payer: BC Managed Care – PPO | Admitting: General Surgery

## 2014-02-17 ENCOUNTER — Encounter (INDEPENDENT_AMBULATORY_CARE_PROVIDER_SITE_OTHER): Payer: Self-pay | Admitting: General Surgery

## 2014-02-17 VITALS — BP 136/76 | HR 82 | Temp 98.0°F | Resp 18 | Ht 67.5 in | Wt 201.0 lb

## 2014-02-17 DIAGNOSIS — C50419 Malignant neoplasm of upper-outer quadrant of unspecified female breast: Secondary | ICD-10-CM

## 2014-02-17 DIAGNOSIS — C50411 Malignant neoplasm of upper-outer quadrant of right female breast: Secondary | ICD-10-CM

## 2014-02-17 NOTE — Patient Instructions (Signed)
You have recovered from your second lumpectomy with reexcision margins without any obvious surgical complications. The wound is healing uneventfully.  You have been referred back to Dr. Thea Silversmith to decide about radiation therapy.  You have  been referred to Dr. Lindi Adie for medical oncology consultation.  Return to see Dr. Dalbert Batman in 6 weeks for a wound check.

## 2014-02-17 NOTE — Progress Notes (Addendum)
Patient ID: Donna Manning, female   DOB: 08/08/1950, 63 y.o.   MRN: [id number]  History:  This patient underwent right partial mastectomy with double wire localization and right axillary sentinel node biopsy on 01/20/2014. 2 foci of invasive cancer was found, one at 3.9 cm and a second one 0.25 cm. Meds are negative. There was invasive cancer present at the superior margin.  She was returned to the operating room on 02/05/2014 underwent reexcision of the superior margin. Surgical pathology revealed no residual cancer. She is pleased with this. She has no wound or cosmetic problems.  Exam:  Right breast and axillary incisions are healing without any obvious complications. The tissues were relatively soft. No obvious hematoma or seroma. No skin necrosis. As expected, there is a small defect involving loss inferolaterally in the right breast. She is alert. No distress.  Assessment:  Multifocal invasive cancer right breast, lower outer quadrant, stage TII and 0. Invasive cancer is present at the superior margin  Receptor positive. HER-2 negative. Pathologic stage T2, N0. Oncotype SCORE 14. RECURRENCE RISK WITH TAMOXIFEN ALONE =  9%.  Plan:  Wound care discussed  To see Dr. Wentworth in radiation oncology this week To see Dr. Gudena in medical oncology in September. Return to see me in 6 weeks. We talked about long-term followup. She stated that she would prefer to be followed long-term by her medical oncologist. As such, she will probably graduate from my care at the next visit. I'll discuss long-term surveillance with her one last time.     Haywood M. Ingram, M.D., FACS  [facility]  General and Minimally invasive Surgery  Breast and Colorectal Surgery  Office: [phone]  Pager: [phone]  

## 2014-02-19 ENCOUNTER — Encounter: Payer: Self-pay | Admitting: *Deleted

## 2014-02-19 ENCOUNTER — Ambulatory Visit
Admission: RE | Admit: 2014-02-19 | Discharge: 2014-02-19 | Disposition: A | Discharge status: Home / Self Care | Payer: BC Managed Care – PPO | Source: Ambulatory Visit | Attending: Radiation Oncology | Admitting: Radiation Oncology

## 2014-02-19 ENCOUNTER — Encounter: Payer: Self-pay | Admitting: Radiation Oncology

## 2014-02-19 VITALS — BP 113/58 | HR 75 | Temp 98.3°F | Wt 201.6 lb

## 2014-02-19 DIAGNOSIS — Z510 Encounter for antineoplastic radiation therapy: Secondary | ICD-10-CM | POA: Insufficient documentation

## 2014-02-19 DIAGNOSIS — C50411 Malignant neoplasm of upper-outer quadrant of right female breast: Secondary | ICD-10-CM

## 2014-02-19 DIAGNOSIS — C50919 Malignant neoplasm of unspecified site of unspecified female breast: Secondary | ICD-10-CM | POA: Diagnosis not present

## 2014-02-19 NOTE — Progress Notes (Signed)
Please see the Nurse Progress Note in the MD Initial Consult Encounter for this patient. 

## 2014-02-19 NOTE — Progress Notes (Signed)
   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  She underwent a lumpectomy of 01/20/14 which revealed 2 foci of invasive ductal carcinoma Grade ! Spanning 3.9 and 0.25 cm. This was associated with DCIS and ALH. A superior margin was focally positive. She underwent reexcision on 7/22 which whowed fibrocystic change and no residual carcinoma. She has recovered well from surgery although she is still sore. She is looking forward to the arrival of another grandchild in a month. She has not seen medical oncology.   Allergies:  Allergies  Allergen Reactions  . Actonel [Risedronate Sodium] Other (See Comments)    PAIN  . Contrast Media [Iodinated Diagnostic Agents] Hives  . Starch Rash    AS A LAUNDRY ADDITIVE    Medications:  Current Outpatient Prescriptions  Medication Sig Dispense Refill  . levothyroxine (SYNTHROID, LEVOTHROID) 100 MCG tablet Take 100 mcg by mouth once a week. On Sunday      . levothyroxine (SYNTHROID, LEVOTHROID) 88 MCG tablet TAKE ONE TABLET BY MOUTH ONCE DAILY  90 tablet  0  . loratadine (CLARITIN) 10 MG tablet Take 10 mg by mouth daily.      Marland Kitchen omeprazole (PRILOSEC) 40 MG capsule Take 40 mg by mouth daily.       Marland Kitchen HYDROcodone-acetaminophen (NORCO) 5-325 MG per tablet Take 1-2 tablets by mouth every 6 (six) hours as needed for moderate pain or severe pain.  30 tablet  0   No current facility-administered medications for this encounter.    Physical Exam:  Filed Vitals:   02/19/14 0938  BP: 113/58  Pulse: 75  Temp: 98.3 F (36.8 C)  Weight: 201 lb 9.6 oz (91.445 kg)   Healing scar in the right breast with no evidence of infection. No palpable abnormalities of the left breast  IMPRESSION: Donna Manning is  a 63 y.o. female s/p lumpectomy for T2N0 right breast cancer  PLAN:  I spoke to the patient today regarding her diagnosis and options for treatment. We discussed the equivalence in terms of survival and local failure between mastectomy and breast conservation. We discussed the role of radiation in decreasing local failures in patients who undergo lumpectomy. We discussed the process of simulation and the placement tattoos. We discussed 4-6 weeks of treatment as an outpatient. We discussed the possibility of asymptomatic lung damage. We discussed the low likelihood of secondary malignancies. We discussed the possible side effects including but not limited to skin redness, fatigue, permanent skin darkening, and breast swelling. We discussed the role of the Oncotype test in determining chemotherapy. She does have a T2 tumor so I think it is appropriate to order but given her 100%ER and PR and low Ki67, I very much doubt she would benefit.  I have ordered the oncotype and will move her appointment with medical oncology up.    I will see her back after her Oncotype score. I did clarify with her that if she needed chemotherapy this would be performed prior to radiation.  I spent 40 minutes face to face with the patient and more than 50% of that time was spent in counseling and/or coordination of care.      Thea Silversmith, MD

## 2014-02-19 NOTE — Progress Notes (Signed)
Order received from Dr. Pablo Ledger for Oncotype Dx testing. Requisition sent to pathology. Received by Peter Congo. PAC Sent to [facility].

## 2014-02-21 ENCOUNTER — Telehealth: Payer: Self-pay | Admitting: *Deleted

## 2014-02-21 NOTE — Telephone Encounter (Signed)
Received request from Dr. Lindi Adie to see when Oncotype Dx results are scheduled to be back and reschedule pt to be seen sooner.  Called and left a message for pt to return my call. Emailed Dr. Lindi Adie to make him aware.

## 2014-02-26 ENCOUNTER — Telehealth: Payer: Self-pay | Admitting: Internal Medicine

## 2014-02-26 NOTE — Telephone Encounter (Signed)
Denied.  Pt has not been here since 2011.  Please get her scheduled to re-establish.

## 2014-02-26 NOTE — Telephone Encounter (Signed)
Pt is needing new rx for omeprazole (PRILOSEC) 40 MG capsule, send to [facility].

## 2014-02-27 ENCOUNTER — Ambulatory Visit
Admission: RE | Admit: 2014-02-27 | Payer: BC Managed Care – PPO | Source: Ambulatory Visit | Admitting: Radiation Oncology

## 2014-02-27 NOTE — Telephone Encounter (Signed)
According to the system pt was last seen 11/30/2012.

## 2014-02-28 ENCOUNTER — Telehealth: Payer: Self-pay | Admitting: *Deleted

## 2014-02-28 ENCOUNTER — Other Ambulatory Visit: Payer: Self-pay | Admitting: *Deleted

## 2014-02-28 ENCOUNTER — Encounter: Payer: Self-pay | Admitting: *Deleted

## 2014-02-28 MED ORDER — OMEPRAZOLE 40 MG PO CPDR: 40.0000 mg | DELAYED_RELEASE_CAPSULE | Freq: Every day | ORAL | Status: DC

## 2014-02-28 NOTE — Telephone Encounter (Signed)
Spoke with patient and rescheduled her appointment with Dr. Lindi Adie to 03/14/14 at 230pm. Patient aware.

## 2014-02-28 NOTE — Telephone Encounter (Signed)
1 refill sent to [id number]

## 2014-02-28 NOTE — Progress Notes (Signed)
Completed chart, added to spreadsheet and placed in MD's box.

## 2014-02-28 NOTE — Telephone Encounter (Signed)
Donna Manning gave me appt information and we noticed that she is a new pt for Dr. Lindi Adie.  Called pt to make her aware that we need her to come in at 2pm for check in.  Mailed before appt letter, welcoming packet & intake form to pt.  Updated appt information and added fc appt in system.

## 2014-03-07 ENCOUNTER — Telehealth: Payer: Self-pay

## 2014-03-07 NOTE — Telephone Encounter (Signed)
Patient called to inquire about oncotype score.I do not see results.Told her we would contact her as soon as we have results.

## 2014-03-10 ENCOUNTER — Encounter: Payer: Self-pay | Admitting: *Deleted

## 2014-03-10 NOTE — Progress Notes (Signed)
Received Oncotype Dx results of 14.  Placed a copy in Dr. Geralyn Flash office and took copy to HIM to scan.

## 2014-03-12 ENCOUNTER — Telehealth: Payer: Self-pay

## 2014-03-12 NOTE — Telephone Encounter (Signed)
Informed patient that oncotype score is back and that Dr.Gudena will review the results on Friday 03/14/14 as this will be better explained as to whether she will need chemotherapy or not.Dr.Wentworth ordered as courtesy to prevent delay in care.

## 2014-03-14 ENCOUNTER — Ambulatory Visit (HOSPITAL_BASED_OUTPATIENT_CLINIC_OR_DEPARTMENT_OTHER): Payer: BC Managed Care – PPO | Admitting: Hematology and Oncology

## 2014-03-14 ENCOUNTER — Encounter: Payer: Self-pay | Admitting: Hematology and Oncology

## 2014-03-14 ENCOUNTER — Telehealth: Payer: Self-pay | Admitting: *Deleted

## 2014-03-14 ENCOUNTER — Telehealth: Payer: Self-pay | Admitting: Internal Medicine

## 2014-03-14 ENCOUNTER — Ambulatory Visit: Payer: BC Managed Care – PPO

## 2014-03-14 VITALS — BP 121/70 | HR 83 | Temp 98.5°F | Resp 18 | Ht 67.5 in | Wt 200.8 lb

## 2014-03-14 DIAGNOSIS — C50411 Malignant neoplasm of upper-outer quadrant of right female breast: Secondary | ICD-10-CM

## 2014-03-14 DIAGNOSIS — Z170 Estrogen receptor positive status [ER+]: Secondary | ICD-10-CM

## 2014-03-14 DIAGNOSIS — C50419 Malignant neoplasm of upper-outer quadrant of unspecified female breast: Secondary | ICD-10-CM

## 2014-03-14 NOTE — Telephone Encounter (Signed)
Called and spoke to the pharmacy.  The patient's prescription is ready for pick up and they have been waiting on her to come get it. Tried reaching the pt.  LMOM for the pt to return my call.

## 2014-03-14 NOTE — Progress Notes (Signed)
Checked in pt.  She is not new, she was here on 01/01/14 for breast clinic.  She was already aware of the [employer] and all the services the financial advocates provide.  She already has Raquel's card for any questions or concerns.

## 2014-03-14 NOTE — Telephone Encounter (Signed)
Pt has been checking with [facility] and they keep telling her they don't have her rx for omeprazole (PRILOSEC) 40 MG capsule  Showing it was sent on 02/28/14 can you resend please    Pharmacy ; [place]

## 2014-03-14 NOTE — Assessment & Plan Note (Signed)
1. Right breast invasive ductal carcinoma T2, N0, M0 stage II A3 0.9 cm and a separate nodule 0.25 cm ER/PR positive HER-2 negative, Oncotype DX recurrence score is 14 (9% risk of distant recurrence over 10 years with tamoxifen alone): Patient does not require adjuvant chemotherapy. I agree with Dr. Pablo Ledger she will need adjuvant radiation. We will call and schedule her an appointment with radiation oncology to initiate radiation therapy.  2. I explained the pathology report in great detail and discussed significance of estrogen progesterone and HER-2 receptors as well as the significance and details of Oncotype DX. I provided her with her report so that she can have a copy. I strongly recommended antiestrogen therapy with aromatase inhibitors.  3. We discussed the risks and benefits of anti-estrogen therapy with aromatase inhibitors. These include but not limited to insomnia, hot flashes, mood changes, vaginal dryness, bone density loss, and weight gain. Although rare, serious side effects including endometrial cancer, risk of blood clots were also discussed. We strongly believe that the benefits far outweigh the risks. Planned treatment duration is 5 years.  3. patient reported that her daughter was diagnosed with MTHFR. gene mutation. I discussed with her that without the presence of underlying blood clot I do not believe there is any benefit to do this genetic testing. However I would check a homocysteine level. If it is elevated I would recommend B. complex vitamins along with folate acid.

## 2014-03-14 NOTE — Progress Notes (Signed)
Patient Care Team: Burnis Medin, MD as PCP - General Floyce Stakes. Pamala Hurry, MD as Attending Physician (Obstetrics and Gynecology) Coral Spikes, MD (Gastroenterology) Fabio Pierce, MD (Ophthalmology) [name], MD as Consulting Physician (General Surgery) Thea Silversmith, MD as Consulting Physician (Radiation Oncology)  DIAGNOSIS: Breast cancer of upper-outer quadrant of right female breast   Primary site: Breast (Right)   Staging method: AJCC 7th Edition   Clinical: Stage 0 (Tis (DCIS), N0, cM0)   Summary: Stage 0 (Tis (DCIS), N0, cM0)   Clinical comments: Staged at breast conference 01/01/14.   SUMMARY OF ONCOLOGIC HISTORY:   Breast cancer of upper-outer quadrant of right female breast   12/24/2013 Initial Diagnosis Breast cancer of upper-outer quadrant of right female breast: Initial biopsy showed DCIS with calcifications and atypical ductal hyperplasia with suspicion of stromal invasion ER 100% PR 100%   01/20/2014 Surgery Right breast lumpectomy, IDC grade 1; 3.9 cm and 0.25 cm with DCIS int grade with ALH 3 SLN negative superior margin positive, ER 100% PR 100% HER-2 negative ratio 1.1 Ki-67 3% Oncotype 14 low risk    02/05/2014 Surgery Reexcision of the superior margin no residual cancer    CHIEF COMPLIANT: Postsurgical followup of breast cancer  INTERVAL HISTORY: Donna Manning is a 63 year old Caucasian lady with above-mentioned history of stage II A. invasive ductal carcinoma. She underwent surgery and is here today to discuss the pathology reports and whether there is any benefit to doing adjuvant systemic chemotherapy. Dr. Pablo Ledger in order Oncotype DX testing which came back as a recurrence score of 14 that fell and the low risk category. Patient did not the significance of this and is interested to discuss further today. From surgery standpoint she is healing very well and is interested in pursuing the radiation treatment.   REVIEW OF SYSTEMS:   Constitutional: Denies  fevers, chills or abnormal weight loss Eyes: Denies blurriness of vision Ears, nose, mouth, throat, and face: Denies mucositis or sore throat Respiratory: Denies cough, dyspnea or wheezes Cardiovascular: Denies palpitation, chest discomfort or lower extremity swelling Gastrointestinal:  Denies nausea, heartburn or change in bowel habits Skin: Denies abnormal skin rashes Lymphatics: Denies new lymphadenopathy or easy bruising Neurological:Denies numbness, tingling or new weaknesses Behavioral/Psych: Mood is stable, no new changes  Breast: Very mild discomfort in the breast that she had surgery. All other systems were reviewed with the patient and are negative.  I have reviewed the past medical history, past surgical history, social history and family history with the patient and they are unchanged from previous note.  ALLERGIES:  is allergic to actonel; contrast media; and starch.  MEDICATIONS:  Current Outpatient Prescriptions  Medication Sig Dispense Refill  . levothyroxine (SYNTHROID, LEVOTHROID) 100 MCG tablet Take 100 mcg by mouth once a week. On Sunday      . levothyroxine (SYNTHROID, LEVOTHROID) 88 MCG tablet TAKE ONE TABLET BY MOUTH ONCE DAILY  90 tablet  0  . loratadine (CLARITIN) 10 MG tablet Take 10 mg by mouth daily.      Marland Kitchen omeprazole (PRILOSEC) 40 MG capsule Take 1 capsule (40 mg total) by mouth daily.  30 capsule  0  . HYDROcodone-acetaminophen (NORCO) 5-325 MG per tablet Take 1-2 tablets by mouth every 6 (six) hours as needed for moderate pain or severe pain.  30 tablet  0   No current facility-administered medications for this visit.   Active Ambulatory Problems    Diagnosis Date Noted  . HERPES LABIALIS 12/10/2008  . HYPOTHYROIDISM 05/12/2008  .  HYPERLIPIDEMIA 04/24/2007  . DYSFUNCTION OF EUSTACHIAN TUBE 12/10/2008  . SINUSITIS - ACUTE-NOS 12/10/2008  . ESOPHAGITIS 06/21/2008  . GERD 04/24/2007  . GASTRITIS 06/21/2008  . OSTEOPENIA 04/24/2007  . FATIGUE  06/01/2009  . WEIGHT LOSS 06/24/2010  . CHEST PAIN, ATYPICAL 06/01/2009  . NONSPECIFIC ABNORMAL ELECTROCARDIOGRAM 06/24/2010  . ADVERSE REACTION TO MEDICATION 05/12/2008  . DVT, HX OF 04/24/2007  . SKIN LESION, UNCERTAIN SIGNIFICANCE 06/24/2010  . SOLAR KERATOSIS 06/24/2010  . CAROTID BRUIT 07/16/2010  . Barrett's esophagus   . Osteopenia   . Visit for preventive health examination 12/03/2011  . High triglycerides 12/03/2011  . Breast cancer of upper-outer quadrant of right female breast 12/31/2013   Resolved Ambulatory Problems    Diagnosis Date Noted  . No Resolved Ambulatory Problems   Past Medical History  Diagnosis Date  . GERD (gastroesophageal reflux disease)   . Hypothyroidism   . History of DVT (deep vein thrombosis) > 30 years ago  . History of gastric ulcer   . Dental crowns present   . Breast cancer 01/2014   Past Surgical History  Procedure Laterality Date  . Upper gi endoscopy    . Colonoscopy    . Partial mastectomy with needle localization and axillary sentinel lymph node bx Right 01/20/2014    Procedure: RIGHT PARTIAL MASTECTOMY WITH DOUBLE  NEEDLE LOCALIZATION (BRACKETED )AND AXILLARY SENTINEL LYMPH NODE BX;  Surgeon: [name], MD;  Location: [facility];  Service: General;  Laterality: Right;  And Axilla.  . Breast biopsy Right 1990  . Re-excision of breast cancer,superior margins Right 02/05/2014    Procedure: RIGHT LUMPECTOMY, RE-EXCISION OF BREAST CANCER,SUPERIOR MARGINS;  Surgeon: [name], MD;  Location: [place];  Service: General;  Laterality: Right;   History   Social History  . Marital Status: Married    Spouse Name: N/A    Number of Children: N/A  . Years of Education: N/A   Occupational History  . Not on file.   Social History Main Topics  . Smoking status: Never Smoker   . Smokeless tobacco: Never Used  . Alcohol Use: No  . Drug Use: No  . Sexual Activity: Not on file   Other Topics  Concern  . Not on file   Social History Narrative   Married   Regular Exercise- had not been    hh of _0 cxats of child    [place] to [facility]    Family History  Problem Relation Age of Onset  . Heart failure Mother 44  . Parkinsonism Father 97  . Thyroid disease Sister   . Stomach cancer Paternal Grandfather     PHYSICAL EXAMINATION: ECOG PERFORMANCE STATUS: 1 - Symptomatic but completely ambulatory  Filed Vitals:   03/14/14 1417  BP: 121/70  Pulse: 83  Temp: 98.5 F (36.9 C)  Resp: 18   Filed Weights   03/14/14 1417  Weight: 200 lb 12.8 oz (91.082 kg)    GENERAL:alert, no distress and comfortable SKIN: skin color, texture, turgor are normal, no rashes or significant lesions EYES: normal, Conjunctiva are pink and non-injected, sclera clear OROPHARYNX:no exudate, no erythema and lips, buccal mucosa, and tongue normal  NECK: supple, thyroid normal size, non-tender, without nodularity LYMPH:  no palpable lymphadenopathy in the cervical, axillary or inguinal LUNGS: clear to auscultation and percussion with normal breathing effort HEART: regular rate & rhythm and no murmurs and no lower extremity edema ABDOMEN:abdomen soft, non-tender and  normal bowel sounds Musculoskeletal:no cyanosis of digits and no clubbing  NEURO: alert & oriented x 3 with fluent speech, no focal motor/sensory deficits  LABORATORY DATA:  I have reviewed the data as listed   Chemistry      Component Value Date/Time   NA 143 01/01/2014 0838   NA 138 11/27/2012 1025   K 3.9 01/01/2014 0838   K 3.9 11/27/2012 1025   CL 107 11/27/2012 1025   CO2 26 01/01/2014 0838   CO2 25 11/27/2012 1025   BUN 12.9 01/01/2014 0838   BUN 13 11/27/2012 1025   CREATININE 0.9 01/01/2014 0838   CREATININE 0.8 11/27/2012 1025      Component Value Date/Time   CALCIUM 9.2 01/01/2014 0838   CALCIUM 9.1 11/27/2012 1025   CALCIUM 9.8 08/18/2006 2105   ALKPHOS 51 01/01/2014 0838   ALKPHOS 41 11/27/2012 1025   AST 19 01/01/2014  0838   AST 20 11/27/2012 1025   ALT 22 01/01/2014 0838   ALT 22 11/27/2012 1025   BILITOT 0.39 01/01/2014 0838   BILITOT 0.6 11/27/2012 1025       Lab Results  Component Value Date   WBC 4.3 01/01/2014   HGB 12.7 01/20/2014   HCT 37.2 01/01/2014   MCV 90.7 01/01/2014   PLT 262 01/01/2014   NEUTROABS 2.1 01/01/2014     RADIOGRAPHIC STUDIES: I have personally reviewed the radiology reports and agreed with their findings. Previous MRIs and mammograms were reviewed No results found.   ASSESSMENT & PLAN:  Breast cancer of upper-outer quadrant of right female breast 1. Right breast invasive ductal carcinoma T2, N0, M0 stage II A3 0.9 cm and a separate nodule 0.25 cm ER/PR positive HER-2 negative, Oncotype DX recurrence score is 14 (9% risk of distant recurrence over 10 years with tamoxifen alone): Patient does not require adjuvant chemotherapy. I agree with Dr. Pablo Ledger she will need adjuvant radiation. We will call and schedule her an appointment with radiation oncology to initiate radiation therapy.  2. I explained the pathology report in great detail and discussed significance of estrogen progesterone and HER-2 receptors as well as the significance and details of Oncotype DX. I provided her with her report so that she can have a copy. I strongly recommended antiestrogen therapy with aromatase inhibitors.  3. We discussed the risks and benefits of anti-estrogen therapy with aromatase inhibitors. These include but not limited to insomnia, hot flashes, mood changes, vaginal dryness, bone density loss, and weight gain. Although rare, serious side effects including endometrial cancer, risk of blood clots were also discussed. We strongly believe that the benefits far outweigh the risks. Planned treatment duration is 5 years.  3. patient reported that her daughter was diagnosed with MTHFR. gene mutation. I discussed with her that without the presence of underlying blood clot I do not believe there is any  benefit to do this genetic testing. However I would check a homocysteine level. If it is elevated I would recommend B. complex vitamins along with folate acid.    No orders of the defined types were placed in this encounter.   The patient has a good understanding of the overall plan. she agrees with it. She will call with any problems that may develop before her next visit here.  I spent 55 minutes counseling the patient face to face. The total time spent in the appointment was 60 minutes and more than 50% was on counseling and review of test results    Rulon Eisenmenger,  MD 03/14/2014 3:18 PM

## 2014-03-14 NOTE — Telephone Encounter (Signed)
Left message for patient for radiation appt on 9/2 at 3:30 pm.

## 2014-03-14 NOTE — Progress Notes (Signed)
Note created by Dr. Gudena during office visit. Copy to patient, original to scan. 

## 2014-03-17 ENCOUNTER — Telehealth: Payer: Self-pay | Admitting: Hematology and Oncology

## 2014-03-17 NOTE — Telephone Encounter (Signed)
, °

## 2014-03-17 NOTE — Telephone Encounter (Signed)
Called and spoke to the pt.  She has picked up her prescription.  No further action needed.

## 2014-03-19 ENCOUNTER — Ambulatory Visit
Admission: RE | Admit: 2014-03-19 | Discharge: 2014-03-19 | Disposition: A | Discharge status: Home / Self Care | Payer: BC Managed Care – PPO | Source: Ambulatory Visit | Attending: Radiation Oncology | Admitting: Radiation Oncology

## 2014-03-19 ENCOUNTER — Ambulatory Visit: Payer: BC Managed Care – PPO

## 2014-03-19 ENCOUNTER — Ambulatory Visit: Payer: BC Managed Care – PPO | Admitting: Radiation Oncology

## 2014-03-19 DIAGNOSIS — C50411 Malignant neoplasm of upper-outer quadrant of right female breast: Secondary | ICD-10-CM

## 2014-03-19 DIAGNOSIS — Z510 Encounter for antineoplastic radiation therapy: Secondary | ICD-10-CM | POA: Diagnosis not present

## 2014-03-19 NOTE — Progress Notes (Signed)
Radiation Oncology         [phone]) [phone] ________________________________  Name: Donna Manning      MRN: [id number]          Date: 03/19/2014              DOB: Jun 14, 1951  Optical Surface Tracking Plan:  Since intensity modulated radiotherapy (IMRT) and 3D conformal radiation treatment methods are predicated on accurate and precise positioning for treatment, intrafraction motion monitoring is medically necessary to ensure accurate and safe treatment delivery.  The ability to quantify intrafraction motion without excessive ionizing radiation dose can only be performed with optical surface tracking. Accordingly, surface imaging offers the opportunity to obtain 3D measurements of patient position throughout IMRT and 3D treatments without excessive radiation exposure.  I am ordering optical surface tracking for this patient's upcoming course of radiotherapy. ________________________________ Signature   Reference:   Ursula Alert, J, et al. Surface imaging-based analysis of intrafraction motion for breast radiotherapy patients.Journal of [place], n. 6, nov. 2014. ISSN [id number].   Available at: <http://www.jacmp.org/index.php/jacmp/article/view/4957>.

## 2014-03-19 NOTE — Progress Notes (Signed)
Name: Donna Manning   MRN: [id number]  Date:  03/19/2014  DOB: March 06, 1951  Status:outpatient    DIAGNOSIS: Breast cancer.  CONSENT VERIFIED: yes   SET UP: Patient is setup supine   IMMOBILIZATION:  The following immobilization was used:Custom Moldable Pillow, breast board.   NARRATIVE: [name] was brought to the [place].  Identity was confirmed.  All relevant records and images related to the planned course of therapy were reviewed.  Then, the patient was positioned in a stable reproducible clinical set-up for radiation therapy.  Wires were placed to delineate the clinical extent of breast tissue. A wire was placed on the scar as well.  CT images were obtained.  An isocenter was placed. Skin markings were placed.  The CT images were loaded into the planning software where the target and avoidance structures were contoured.  The radiation prescription was entered and confirmed. The patient was discharged in stable condition and tolerated simulation well.    TREATMENT PLANNING NOTE:  Treatment planning then occurred. I have requested : MLC's, isodose plan, basic dose calculation  I personally designed and supervised the construction of 3 medically necessary complex treatment devices for the protection of critical normal structures including the lungs and contralateral breast as well as the immobilization device which is necessary for set up certainty.   3D simulation was requested with [facility] analysis of the heart, lungs and lumpectomy cavity.

## 2014-03-26 ENCOUNTER — Other Ambulatory Visit (INDEPENDENT_AMBULATORY_CARE_PROVIDER_SITE_OTHER): Payer: BC Managed Care – PPO

## 2014-03-26 DIAGNOSIS — Z510 Encounter for antineoplastic radiation therapy: Secondary | ICD-10-CM | POA: Diagnosis not present

## 2014-03-26 DIAGNOSIS — Z0000 Encounter for general adult medical examination without abnormal findings: Secondary | ICD-10-CM

## 2014-03-26 LAB — HEPATIC FUNCTION PANEL
ALT: 26 U/L (ref 0–35)
AST: 21 U/L (ref 0–37)
Albumin: 3.8 g/dL (ref 3.5–5.2)
Alkaline Phosphatase: 48 U/L (ref 39–117)
Bilirubin, Direct: 0 mg/dL (ref 0.0–0.3)
Total Bilirubin: 0.5 mg/dL (ref 0.2–1.2)
Total Protein: 7 g/dL (ref 6.0–8.3)

## 2014-03-26 LAB — CBC WITH DIFFERENTIAL/PLATELET
Basophils Absolute: 0.1 K/uL (ref 0.0–0.1)
Basophils Relative: 1.1 % (ref 0.0–3.0)
Eosinophils Absolute: 0.3 K/uL (ref 0.0–0.7)
Eosinophils Relative: 6.1 % — ABNORMAL HIGH (ref 0.0–5.0)
HCT: 38.2 % (ref 36.0–46.0)
Hemoglobin: 12.6 g/dL (ref 12.0–15.0)
Lymphocytes Relative: 36 % (ref 12.0–46.0)
Lymphs Abs: 1.7 K/uL (ref 0.7–4.0)
MCHC: 33.1 g/dL (ref 30.0–36.0)
MCV: 91 fl (ref 78.0–100.0)
Monocytes Absolute: 0.5 K/uL (ref 0.1–1.0)
Monocytes Relative: 9.7 % (ref 3.0–12.0)
Neutro Abs: 2.3 K/uL (ref 1.4–7.7)
Neutrophils Relative %: 47.1 % (ref 43.0–77.0)
Platelets: 248 K/uL (ref 150.0–400.0)
RBC: 4.19 Mil/uL (ref 3.87–5.11)
RDW: 13.4 % (ref 11.5–15.5)
WBC: 4.9 K/uL (ref 4.0–10.5)

## 2014-03-26 LAB — BASIC METABOLIC PANEL WITH GFR
BUN: 12 mg/dL (ref 6–23)
CO2: 29 meq/L (ref 19–32)
Calcium: 9.3 mg/dL (ref 8.4–10.5)
Chloride: 106 meq/L (ref 96–112)
Creatinine, Ser: 0.8 mg/dL (ref 0.4–1.2)
GFR: 75.54 mL/min
Glucose, Bld: 98 mg/dL (ref 70–99)
Potassium: 4.4 meq/L (ref 3.5–5.1)
Sodium: 139 meq/L (ref 135–145)

## 2014-03-26 LAB — LIPID PANEL
CHOL/HDL RATIO: 5
Cholesterol: 194 mg/dL (ref 0–200)
HDL: 43 mg/dL (ref >39.00)
LDL CALC: 129 mg/dL — AB (ref 0–99)
NONHDL: 151
Triglycerides: 110 mg/dL (ref 0.0–149.0)
VLDL: 22 mg/dL (ref 0.0–40.0)

## 2014-03-26 LAB — TSH: TSH: 0.25 u[IU]/mL — ABNORMAL LOW (ref 0.35–4.50)

## 2014-03-28 ENCOUNTER — Ambulatory Visit
Admission: RE | Admit: 2014-03-28 | Discharge: 2014-03-28 | Disposition: A | Discharge status: Home / Self Care | Payer: BC Managed Care – PPO | Source: Ambulatory Visit | Attending: Radiation Oncology | Admitting: Radiation Oncology

## 2014-03-28 DIAGNOSIS — Z510 Encounter for antineoplastic radiation therapy: Secondary | ICD-10-CM | POA: Diagnosis not present

## 2014-03-28 DIAGNOSIS — C50411 Malignant neoplasm of upper-outer quadrant of right female breast: Secondary | ICD-10-CM

## 2014-03-31 ENCOUNTER — Ambulatory Visit
Admission: RE | Admit: 2014-03-31 | Discharge: 2014-03-31 | Disposition: A | Discharge status: Home / Self Care | Payer: BC Managed Care – PPO | Source: Ambulatory Visit | Attending: Radiation Oncology | Admitting: Radiation Oncology

## 2014-03-31 DIAGNOSIS — Z510 Encounter for antineoplastic radiation therapy: Secondary | ICD-10-CM | POA: Diagnosis not present

## 2014-04-01 ENCOUNTER — Encounter (INDEPENDENT_AMBULATORY_CARE_PROVIDER_SITE_OTHER): Payer: BC Managed Care – PPO | Admitting: General Surgery

## 2014-04-01 ENCOUNTER — Ambulatory Visit
Admission: RE | Admit: 2014-04-01 | Discharge: 2014-04-01 | Disposition: A | Discharge status: Home / Self Care | Payer: BC Managed Care – PPO | Source: Ambulatory Visit | Attending: Radiation Oncology | Admitting: Radiation Oncology

## 2014-04-01 VITALS — BP 102/60 | HR 68 | Temp 98.3°F | Wt 200.2 lb

## 2014-04-01 DIAGNOSIS — C50411 Malignant neoplasm of upper-outer quadrant of right female breast: Secondary | ICD-10-CM

## 2014-04-01 DIAGNOSIS — Z510 Encounter for antineoplastic radiation therapy: Secondary | ICD-10-CM | POA: Diagnosis not present

## 2014-04-01 MED ORDER — ALRA NON-METALLIC DEODORANT (RAD-ONC)
1.0000 "application " | Freq: Once | TOPICAL | Status: AC
  Administered 2014-04-01: 1 via TOPICAL

## 2014-04-01 MED ORDER — RADIAPLEXRX EX GEL
Freq: Once | CUTANEOUS | Status: AC
  Administered 2014-04-01: 09:00:00 via TOPICAL

## 2014-04-01 NOTE — Progress Notes (Signed)
Weekly Management Note Current Dose: 5  Gy  Projected Dose: 42.5 Gy   Narrative:  The patient presents for routine under treatment assessment.  CBCT/MVCT images/Port film x-rays were reviewed.  The chart was checked. Doing well. No complaints. RN education performed.   Physical Findings: Weight: 200 lb 3.2 oz (90.81 kg). No skin changes. Alert and oriented.   Impression:  The patient is tolerating radiation.  Plan:  Continue treatment as planned. Start radiaplex.

## 2014-04-01 NOTE — Progress Notes (Signed)
  Radiation Oncology         [phone]) [phone] ________________________________  Name: Donna Manning MRN: [id number]  Date: 03/28/2014  DOB: May 11, 1951  Simulation Verification Note  Status: outpatient  NARRATIVE: The patient was brought to the treatment unit and placed in the planned treatment position. The clinical setup was verified. Then port films were obtained and uploaded to the radiation oncology medical record software.  The treatment beams were carefully compared against the planned radiation fields. The position location and shape of the radiation fields was reviewed. The targeted volume of tissue appears appropriately covered by the radiation beams. Organs at risk appear to be excluded as planned.  Based on my personal review, I approved the simulation verification. The patient's treatment will proceed as planned.  ------------------------------------------------  Thea Silversmith, MD

## 2014-04-01 NOTE — Progress Notes (Signed)
Routine of clinic reviewed with patient.Given Radiation Therapy  and You booklet, radiaplex, alra deodorant and skin care sheet.Reviewed possible side effects to include skin discoloration, tenderness, swelling and fatigue.

## 2014-04-02 ENCOUNTER — Ambulatory Visit
Admission: RE | Admit: 2014-04-02 | Discharge: 2014-04-02 | Disposition: A | Discharge status: Home / Self Care | Payer: BC Managed Care – PPO | Source: Ambulatory Visit | Attending: Radiation Oncology | Admitting: Radiation Oncology

## 2014-04-02 ENCOUNTER — Ambulatory Visit (INDEPENDENT_AMBULATORY_CARE_PROVIDER_SITE_OTHER): Payer: BC Managed Care – PPO | Admitting: Internal Medicine

## 2014-04-02 ENCOUNTER — Encounter: Payer: Self-pay | Admitting: Internal Medicine

## 2014-04-02 VITALS — BP 116/76 | Temp 97.9°F | Ht 67.0 in | Wt 198.9 lb

## 2014-04-02 DIAGNOSIS — E039 Hypothyroidism, unspecified: Secondary | ICD-10-CM

## 2014-04-02 DIAGNOSIS — R5383 Other fatigue: Secondary | ICD-10-CM

## 2014-04-02 DIAGNOSIS — E785 Hyperlipidemia, unspecified: Secondary | ICD-10-CM

## 2014-04-02 DIAGNOSIS — C50419 Malignant neoplasm of upper-outer quadrant of unspecified female breast: Secondary | ICD-10-CM

## 2014-04-02 DIAGNOSIS — Z510 Encounter for antineoplastic radiation therapy: Secondary | ICD-10-CM | POA: Diagnosis not present

## 2014-04-02 DIAGNOSIS — K2270 Barrett's esophagus without dysplasia: Secondary | ICD-10-CM

## 2014-04-02 DIAGNOSIS — R5381 Other malaise: Secondary | ICD-10-CM

## 2014-04-02 DIAGNOSIS — Z0000 Encounter for general adult medical examination without abnormal findings: Secondary | ICD-10-CM

## 2014-04-02 DIAGNOSIS — C50411 Malignant neoplasm of upper-outer quadrant of right female breast: Secondary | ICD-10-CM

## 2014-04-02 MED ORDER — OMEPRAZOLE 40 MG PO CPDR: 40.0000 mg | DELAYED_RELEASE_CAPSULE | Freq: Every day | ORAL | Status: DC

## 2014-04-02 MED ORDER — LEVOTHYROXINE SODIUM 88 MCG PO TABS: ORAL_TABLET | ORAL | Status: DC

## 2014-04-02 NOTE — Assessment & Plan Note (Signed)
Under rx radiation at present

## 2014-04-02 NOTE — Patient Instructions (Signed)
Fatigue has many causes many not medical. Evaluate sleep. And exercise  Weight loss .  Will dec dose of thyroid to 88 per day  And do referral to dr Chalmers Cater Plan recheck TSH in 2 months here for there . Ask oncology about flu vaccine but think its ok. Would wait on shingles vaccin until not in active rx.  Healthy lifestyle includes : At least 150 minutes of exercise weeks  , weight at healthy levels, which is usually   BMI 19-25. Avoid trans fats and processed foods;  Increase fresh fruits and veges to 5 servings per day. And avoid sweet beverages including tea and juice. Mediterranean diet with olive oil and nuts have been noted to be heart and brain healthy . Avoid tobacco products . Limit  alcohol to  7 per week for women and 14 servings for men.  Get adequate sleep . Wear seat belts . Don't text and drive .

## 2014-04-02 NOTE — Progress Notes (Signed)
Pre visit review using our clinic review tool, if applicable. No additional management support is needed unless otherwise documented below in the visit note.  Chief Complaint  Patient presents with  . 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 Twitching better. History of Barrett's esophagus would like to change gastroenterologists when gets on Medicare for cost reasons last EGD done at [facility] Dr. Ferdinand Lango Has been feeling extremely tired recently like she wants to sleep all the time and this occurred before her diagnosis of breast cancer. Feels like when she had her thyroid disease diagnosed she had been followed by Dr. Michiel Sites but hasn't seen her in a while Has had her checkup with Dr. Valentino Saxon.  Health Maintenance  Topic Date Due  . Pap Smear  09/30/1967  . Zostavax  09/29/2009  . Influenza Vaccine  02/15/2014  . Mammogram  12/17/2015  . Colonoscopy  11/16/2020  . Tetanus/tdap  11/29/2021   Health Maintenance Review LIFESTYLE:  Exercise:  Some walking  Has plantar fasciits .  Tobacco/ETS: no Alcohol:  no Sugar beverages:pop  One to 2 per day.  Sleep: sleep ok  Drug use: no Bone density: may due believe it would be done by the oncologist Colonoscopy: utd.  ROS:  GEN/ HEENT: No fever, significant weight changes sweats headaches vision problems hearing changes, CV/ PULM; No chest pain shortness of breath cough, syncope,edema  change in exercise tolerance. GI /GU: No adominal pain, vomiting, change in bowel habits. No blood in the stool. No significant GU symptoms. SKIN/HEME: ,no acute skin rashes suspicious lesions or bleeding. No lymphadenopathy, nodules, masses.  NEURO/ PSYCH:  No neurologic signs such as weakness numbness. No depression anxiety. IMM/ Allergy: No unusual  infections.  Allergy .   REST of 12 system review negative except as per HPI   Past Medical History  Diagnosis Date  . GERD (gastroesophageal reflux disease)   . Barrett's esophagus 2009    on EGD  . Hypothyroidism   . History of DVT (deep vein thrombosis) > 30 years ago    after a pregnancy  . History of gastric ulcer     secondary to ASA use  . Dental crowns present   . Breast cancer 01/2014    right    Family History  Problem Relation Age of Onset  . Heart failure Mother 37  . Parkinsonism Father 32  . Thyroid disease Sister   . Stomach cancer Paternal Grandfather     History   Social History  . Marital Status: Married    Spouse Name: N/A    Number of Children: N/A  . Years of Education: N/A   Social History Main Topics  . Smoking status: Never Smoker   . Smokeless tobacco: Never Used  . Alcohol Use: No  . Drug Use: No  . Sexual Activity: None   Other Topics Concern  . None   Social History Narrative   Married   Regular Exercise- had not been    hh of 2  3    2  cxats of child    [place] [facility]    G4 P5    Outpatient Encounter Prescriptions as of 04/02/2014  Medication Sig  . levothyroxine (SYNTHROID, LEVOTHROID) 88 MCG tablet  TAKE ONE TABLET BY MOUTH ONCE DAILY  . loratadine (CLARITIN) 10 MG tablet Take 10 mg by mouth daily.  . non-metallic deodorant Jethro Poling) MISC Apply 1 application topically daily as needed.  Marland Kitchen omeprazole (PRILOSEC) 40 MG capsule Take 1 capsule (40 mg total) by mouth daily.  . Wound Dressings (RADIAGEL) GEL Apply 170 g topically.  . [DISCONTINUED] levothyroxine (SYNTHROID, LEVOTHROID) 100 MCG tablet Take 100 mcg by mouth once a week. On Sunday  . [DISCONTINUED] levothyroxine (SYNTHROID, LEVOTHROID) 88 MCG tablet TAKE ONE TABLET BY MOUTH ONCE DAILY  . [DISCONTINUED] omeprazole (PRILOSEC) 40 MG capsule Take 1 capsule (40 mg total) by mouth daily.    EXAM:  BP 116/76  Temp(Src) 97.9 F (36.6 C) (Oral)  Ht 5\' 7"  (1.702 m)  Wt 198 lb  14.4 oz (90.22 kg)  BMI 31.14 kg/m2  Body mass index is 31.14 kg/(m^2).  Physical Exam: Vital signs reviewed IRC:VELF is a well-developed well-nourished alert cooperative    who appearsr stated age in no acute distress.  HEENT: normocephalic atraumatic , Eyes: PERRL EOM's full, conjunctiva clear, Nares: paten,t no deformity discharge or tenderness., Ears: no deformity EAC's clear TMs with normal landmarks. Mouth: clear OP, no lesions, edema.  Moist mucous membranes. Dentition in adequate repair. NECK: supple without masses, thyromegaly  CHEST/PULM:  Clear to auscultation and percussion breath sounds equal no wheeze , rales or rhonchi. No chest wall deformities or tenderness. Except breast right breast with radiation erythema well-healed scar in the right lateral left breast no obvious nodules or discharge CV: PMI is nondisplaced, S1 S2 no gallops, murmurs, rubs. Peripheral pulses are full without delay.No JVD .  ABDOMEN: Bowel sounds normal nontender  No guard or rebound, no hepato splenomegal no CVA tenderness.  No hernia. Extremtities:  No clubbing cyanosis or edema, no acute joint swelling or redness no focal atrophy NEURO:  Oriented x3, cranial nerves 3-12 appear to be intact, no obvious focal weakness,gait within normal limits no abnormal reflexes or asymmetrical SKIN: No acute rashes normal turgor, color, no bruising or petechiae. Skin changes sun changes. PSYCH: Oriented, good eye contact, no obvious depression anxiety, cognition and judgment appear normal. LN: no cervical axillary inguinal adenopathy  Lab Results  Component Value Date   WBC 4.9 03/26/2014   HGB 12.6 03/26/2014   HCT 38.2 03/26/2014   PLT 248.0 03/26/2014   GLUCOSE 98 03/26/2014   CHOL 194 03/26/2014   TRIG 110.0 03/26/2014   HDL 43.00 03/26/2014   LDLDIRECT 147.2 11/23/2011   LDLCALC 129* 03/26/2014   ALT 26 03/26/2014   AST 21 03/26/2014   NA 139 03/26/2014   K 4.4 03/26/2014   CL 106 03/26/2014   CREATININE 0.8 03/26/2014   BUN 12  03/26/2014   CO2 29 03/26/2014   TSH 0.25* 03/26/2014   Wt Readings from Last 3 Encounters:  04/02/14 198 lb 14.4 oz (90.22 kg)  04/01/14 200 lb 3.2 oz (90.81 kg)  03/14/14 200 lb 12.8 oz (91.082 kg)     ASSESSMENT AND PLAN:  Discussed the following assessment and plan:  Visit for preventive health examination - flu vaccine today utd on tdap  Unspecified hypothyroidism - slight ly oversuppressedm  decreased to 88 mcg a day referral to get back with Dr. Michiel Sites - Plan: Ambulatory referral to Endocrinology  Other and unspecified hyperlipidemia - Lifestyle intervention exercise weight loss would be helpful.  Barrett's esophagus - refill med can refer to local gi when insurance changes  Other malaise and fatigue - Possible  thyroid but multiple factors began before breast cancer diagnosis reviewed findings - Plan: Ambulatory referral to Endocrinology  Breast cancer of upper-outer quadrant of right female breast  Patient Care Team: Burnis Medin, MD as PCP - General Floyce Stakes. Pamala Hurry, MD as Attending Physician (Obstetrics and Gynecology) Coral Spikes, MD (Gastroenterology) Fabio Pierce, MD (Ophthalmology) Fanny Skates, MD as Consulting Physician (General Surgery) Thea Silversmith, MD as Consulting Physician (Radiation Oncology) Patient Instructions  Fatigue has many causes many not medical. Evaluate sleep. And exercise  Weight loss .  Will dec dose of thyroid to 88 per day  And do referral to dr Chalmers Cater Plan recheck TSH in 2 months here for there . Ask oncology about flu vaccine but think its ok. Would wait on shingles vaccin until not in active rx.  Healthy lifestyle includes : At least 150 minutes of exercise weeks  , weight at healthy levels, which is usually   BMI 19-25. Avoid trans fats and processed foods;  Increase fresh fruits and veges to 5 servings per day. And avoid sweet beverages including tea and juice. Mediterranean diet with olive oil and nuts have been noted to be  heart and brain healthy . Avoid tobacco products . Limit  alcohol to  7 per week for women and 14 servings for men.  Get adequate sleep . Wear seat belts . Don't text and drive .         [name]. Paulene Tayag M.D.

## 2014-04-03 ENCOUNTER — Ambulatory Visit
Admission: RE | Admit: 2014-04-03 | Discharge: 2014-04-03 | Disposition: A | Discharge status: Home / Self Care | Payer: BC Managed Care – PPO | Source: Ambulatory Visit | Attending: Radiation Oncology | Admitting: Radiation Oncology

## 2014-04-03 DIAGNOSIS — Z510 Encounter for antineoplastic radiation therapy: Secondary | ICD-10-CM | POA: Diagnosis not present

## 2014-04-04 ENCOUNTER — Ambulatory Visit
Admission: RE | Admit: 2014-04-04 | Discharge: 2014-04-04 | Disposition: A | Discharge status: Home / Self Care | Payer: BC Managed Care – PPO | Source: Ambulatory Visit | Attending: Radiation Oncology | Admitting: Radiation Oncology

## 2014-04-04 DIAGNOSIS — Z510 Encounter for antineoplastic radiation therapy: Secondary | ICD-10-CM | POA: Diagnosis not present

## 2014-04-07 ENCOUNTER — Ambulatory Visit
Admission: RE | Admit: 2014-04-07 | Discharge: 2014-04-07 | Disposition: A | Discharge status: Home / Self Care | Payer: BC Managed Care – PPO | Source: Ambulatory Visit | Attending: Radiation Oncology | Admitting: Radiation Oncology

## 2014-04-07 DIAGNOSIS — Z510 Encounter for antineoplastic radiation therapy: Secondary | ICD-10-CM | POA: Diagnosis not present

## 2014-04-08 ENCOUNTER — Ambulatory Visit
Admission: RE | Admit: 2014-04-08 | Discharge: 2014-04-08 | Disposition: A | Discharge status: Home / Self Care | Payer: BC Managed Care – PPO | Source: Ambulatory Visit | Attending: Radiation Oncology | Admitting: Radiation Oncology

## 2014-04-08 ENCOUNTER — Ambulatory Visit: Payer: BC Managed Care – PPO | Admitting: Hematology and Oncology

## 2014-04-08 ENCOUNTER — Other Ambulatory Visit: Payer: BC Managed Care – PPO

## 2014-04-08 VITALS — BP 95/60 | HR 74 | Temp 98.1°F | Wt 200.7 lb

## 2014-04-08 DIAGNOSIS — C50411 Malignant neoplasm of upper-outer quadrant of right female breast: Secondary | ICD-10-CM

## 2014-04-08 DIAGNOSIS — Z510 Encounter for antineoplastic radiation therapy: Secondary | ICD-10-CM | POA: Diagnosis not present

## 2014-04-08 NOTE — Progress Notes (Signed)
Weekly Management Note Current Dose: 17.5  Gy  Projected Dose: 50 Gy   Narrative:  The patient presents for routine under treatment assessment.  CBCT/MVCT images/Port film x-rays were reviewed.  The chart was checked. Doing well. No complaints  Physical Findings: Weight: 200 lb 11.2 oz (91.037 kg). Unchanged  Impression:  The patient is tolerating radiation.  Plan:  Continue treatment as planned. Continue radiaplex.

## 2014-04-08 NOTE — Progress Notes (Signed)
Weekly assessment of day 7 radiation to right breast.Skin is mildly pink.No pain.Increased fatigue related to thyroid level.Synthroid changed to 88 mcg daily .

## 2014-04-09 ENCOUNTER — Ambulatory Visit
Admission: RE | Admit: 2014-04-09 | Discharge: 2014-04-09 | Disposition: A | Discharge status: Home / Self Care | Payer: BC Managed Care – PPO | Source: Ambulatory Visit | Attending: Radiation Oncology | Admitting: Radiation Oncology

## 2014-04-09 DIAGNOSIS — Z510 Encounter for antineoplastic radiation therapy: Secondary | ICD-10-CM | POA: Diagnosis not present

## 2014-04-10 ENCOUNTER — Ambulatory Visit
Admission: RE | Admit: 2014-04-10 | Discharge: 2014-04-10 | Disposition: A | Discharge status: Home / Self Care | Payer: BC Managed Care – PPO | Source: Ambulatory Visit | Attending: Radiation Oncology | Admitting: Radiation Oncology

## 2014-04-10 DIAGNOSIS — Z510 Encounter for antineoplastic radiation therapy: Secondary | ICD-10-CM | POA: Diagnosis not present

## 2014-04-11 ENCOUNTER — Ambulatory Visit
Admission: RE | Admit: 2014-04-11 | Discharge: 2014-04-11 | Disposition: A | Discharge status: Home / Self Care | Payer: BC Managed Care – PPO | Source: Ambulatory Visit | Attending: Radiation Oncology | Admitting: Radiation Oncology

## 2014-04-11 DIAGNOSIS — Z510 Encounter for antineoplastic radiation therapy: Secondary | ICD-10-CM | POA: Diagnosis not present

## 2014-04-14 ENCOUNTER — Other Ambulatory Visit: Payer: Self-pay | Admitting: Family Medicine

## 2014-04-14 ENCOUNTER — Ambulatory Visit
Admission: RE | Admit: 2014-04-14 | Discharge: 2014-04-14 | Disposition: A | Discharge status: Home / Self Care | Payer: BC Managed Care – PPO | Source: Ambulatory Visit | Attending: Radiation Oncology | Admitting: Radiation Oncology

## 2014-04-14 DIAGNOSIS — Z510 Encounter for antineoplastic radiation therapy: Secondary | ICD-10-CM | POA: Diagnosis not present

## 2014-04-15 ENCOUNTER — Ambulatory Visit: Payer: BC Managed Care – PPO | Admitting: Radiation Oncology

## 2014-04-15 ENCOUNTER — Ambulatory Visit
Admission: RE | Admit: 2014-04-15 | Discharge: 2014-04-15 | Disposition: A | Discharge status: Home / Self Care | Payer: BC Managed Care – PPO | Source: Ambulatory Visit | Attending: Radiation Oncology | Admitting: Radiation Oncology

## 2014-04-15 VITALS — BP 112/62 | HR 79 | Temp 98.5°F | Wt 201.0 lb

## 2014-04-15 DIAGNOSIS — Z510 Encounter for antineoplastic radiation therapy: Secondary | ICD-10-CM | POA: Diagnosis not present

## 2014-04-15 DIAGNOSIS — C50411 Malignant neoplasm of upper-outer quadrant of right female breast: Secondary | ICD-10-CM

## 2014-04-15 NOTE — Progress Notes (Signed)
Weekly Management Note Current Dose: 30  Gy  Projected Dose: 50 Gy   Narrative:  The patient presents for routine under treatment assessment.  CBCT/MVCT images/Port film x-rays were reviewed.  The chart was checked. Doing well. Breast is sore and she is taking Aleve with good relief. Saw endocrinologist and her thyroid is "acting up" wondered if she had a PET scan to eval for thyroid disease.   Physical Findings: Weight: 201 lb (91.173 kg). Unchanged. Slightly pink. 1  Impression:  The patient is tolerating radiation.  Plan:  Continue treatment as planned. Discussed no indication based on stage and symptoms to order PET scan. Encouraged f/u with endocrinology.

## 2014-04-15 NOTE — Progress Notes (Signed)
Weekly assessment of radiation to right OrthoTraffic.ch 12 of 17 treatments.Skin pink with some mild pain "4' of breast greastest around areola. Takes aleve.Continue application of radiaplex twice daily.

## 2014-04-16 ENCOUNTER — Ambulatory Visit
Admission: RE | Admit: 2014-04-16 | Discharge: 2014-04-16 | Disposition: A | Discharge status: Home / Self Care | Payer: BC Managed Care – PPO | Source: Ambulatory Visit | Attending: Radiation Oncology | Admitting: Radiation Oncology

## 2014-04-16 DIAGNOSIS — Z510 Encounter for antineoplastic radiation therapy: Secondary | ICD-10-CM | POA: Diagnosis not present

## 2014-04-17 ENCOUNTER — Other Ambulatory Visit: Payer: Self-pay | Admitting: Internal Medicine

## 2014-04-17 ENCOUNTER — Ambulatory Visit
Admission: RE | Admit: 2014-04-17 | Discharge: 2014-04-17 | Disposition: A | Discharge status: Home / Self Care | Payer: BC Managed Care – PPO | Source: Ambulatory Visit | Attending: Radiation Oncology | Admitting: Radiation Oncology

## 2014-04-17 DIAGNOSIS — Z510 Encounter for antineoplastic radiation therapy: Secondary | ICD-10-CM | POA: Insufficient documentation

## 2014-04-17 DIAGNOSIS — N6459 Other signs and symptoms in breast: Secondary | ICD-10-CM | POA: Diagnosis not present

## 2014-04-17 DIAGNOSIS — L309 Dermatitis, unspecified: Secondary | ICD-10-CM | POA: Insufficient documentation

## 2014-04-17 DIAGNOSIS — R234 Changes in skin texture: Secondary | ICD-10-CM | POA: Insufficient documentation

## 2014-04-17 DIAGNOSIS — C50911 Malignant neoplasm of unspecified site of right female breast: Secondary | ICD-10-CM | POA: Insufficient documentation

## 2014-04-17 NOTE — Telephone Encounter (Signed)
Sent to the pharmacy by e-scribe. 

## 2014-04-18 ENCOUNTER — Ambulatory Visit
Admission: RE | Admit: 2014-04-18 | Discharge: 2014-04-18 | Disposition: A | Discharge status: Home / Self Care | Payer: BC Managed Care – PPO | Source: Ambulatory Visit | Attending: Radiation Oncology | Admitting: Radiation Oncology

## 2014-04-18 DIAGNOSIS — Z510 Encounter for antineoplastic radiation therapy: Secondary | ICD-10-CM | POA: Diagnosis not present

## 2014-04-21 ENCOUNTER — Ambulatory Visit
Admission: RE | Admit: 2014-04-21 | Discharge: 2014-04-21 | Disposition: A | Discharge status: Home / Self Care | Payer: BC Managed Care – PPO | Source: Ambulatory Visit | Attending: Radiation Oncology | Admitting: Radiation Oncology

## 2014-04-21 DIAGNOSIS — Z510 Encounter for antineoplastic radiation therapy: Secondary | ICD-10-CM | POA: Diagnosis not present

## 2014-04-22 ENCOUNTER — Ambulatory Visit
Admission: RE | Admit: 2014-04-22 | Discharge: 2014-04-22 | Disposition: A | Discharge status: Home / Self Care | Payer: BC Managed Care – PPO | Source: Ambulatory Visit | Attending: Radiation Oncology | Admitting: Radiation Oncology

## 2014-04-22 ENCOUNTER — Ambulatory Visit: Payer: BC Managed Care – PPO | Admitting: Radiation Oncology

## 2014-04-22 VITALS — BP 108/63 | HR 82 | Temp 98.4°F | Wt 200.0 lb

## 2014-04-22 DIAGNOSIS — Z510 Encounter for antineoplastic radiation therapy: Secondary | ICD-10-CM | POA: Diagnosis not present

## 2014-04-22 DIAGNOSIS — C50411 Malignant neoplasm of upper-outer quadrant of right female breast: Secondary | ICD-10-CM

## 2014-04-22 MED ORDER — BIAFINE EX EMUL
CUTANEOUS | Status: DC | PRN
  Administered 2014-04-22: 17:00:00 via TOPICAL

## 2014-04-22 NOTE — Addendum Note (Signed)
Encounter addended by: Arlyss Repress, RN on: 04/22/2014  5:01 PM<BR>     Documentation filed: Orders

## 2014-04-22 NOTE — Addendum Note (Signed)
Encounter addended by: Arlyss Repress, RN on: 04/22/2014  5:03 PM<BR>     Documentation filed: Inpatient MAR

## 2014-04-22 NOTE — Progress Notes (Signed)
Weekly Management Note Current Dose:  42.5 Gy  Projected Dose: 50 Gy   Narrative:  The patient presents for routine under treatment assessment.  CBCT/MVCT images/Port film x-rays were reviewed.  The chart was checked. More irritation over breast. Questions about xrays during treatment and "need" for antiestrogen treatment.   Physical Findings: Weight: 200 lb (90.719 kg).dermatitis/dry desquamation over right breast  Impression:  The patient is tolerating radiation.  Plan:  Continue treatment as planned. Continue radiaplex.

## 2014-04-22 NOTE — Progress Notes (Signed)
Patient for weekly assessment of radiation to right breat.Completed 17  of 17 treatments and then starts boost of 3 tomorrow.skin with with follicular rash of right mammary fold and chest.Will change to biafine today.Given card to schedule one month follow up.

## 2014-04-23 ENCOUNTER — Ambulatory Visit
Admission: RE | Admit: 2014-04-23 | Discharge: 2014-04-23 | Disposition: A | Discharge status: Home / Self Care | Payer: BC Managed Care – PPO | Source: Ambulatory Visit | Attending: Radiation Oncology | Admitting: Radiation Oncology

## 2014-04-23 DIAGNOSIS — Z510 Encounter for antineoplastic radiation therapy: Secondary | ICD-10-CM | POA: Diagnosis not present

## 2014-04-24 ENCOUNTER — Ambulatory Visit
Admission: RE | Admit: 2014-04-24 | Discharge: 2014-04-24 | Disposition: A | Discharge status: Home / Self Care | Payer: BC Managed Care – PPO | Source: Ambulatory Visit | Attending: Radiation Oncology | Admitting: Radiation Oncology

## 2014-04-24 DIAGNOSIS — Z510 Encounter for antineoplastic radiation therapy: Secondary | ICD-10-CM | POA: Diagnosis not present

## 2014-04-25 ENCOUNTER — Ambulatory Visit
Admission: RE | Admit: 2014-04-25 | Discharge: 2014-04-25 | Disposition: A | Discharge status: Home / Self Care | Payer: BC Managed Care – PPO | Source: Ambulatory Visit | Attending: Radiation Oncology | Admitting: Radiation Oncology

## 2014-04-25 ENCOUNTER — Encounter: Payer: Self-pay | Admitting: Radiation Oncology

## 2014-04-25 DIAGNOSIS — Z510 Encounter for antineoplastic radiation therapy: Secondary | ICD-10-CM | POA: Diagnosis not present

## 2014-04-28 ENCOUNTER — Ambulatory Visit: Payer: BC Managed Care – PPO

## 2014-04-28 NOTE — Progress Notes (Signed)
  Radiation Oncology         [phone] ________________________________  Name: Donna Manning MRN: [id number]  Date: 04/25/2014  DOB: Jul 09, 1951  End of Treatment Note  Diagnosis:   T2N0 Right breast cancer     Indication for treatment:  Curative       Radiation treatment dates:  03/31/2014-04/25/2014  Site/dose:   Right breast 42.5/2.5 Gy per fraction x 17 fractions Right breast boost 7.5 Gy/2.5 x 3 fractions  Beams/energy:    Opposed tangents with reduced fields/ 6 and 10 MV photons Three field with 6 and 10 MV photons  Narrative: The patient tolerated radiation treatment relatively well.   She had dry desquamation over the treated breast which was treated with radiaplex.   Plan: The patient has completed radiation treatment. The patient will return to radiation oncology clinic for routine followup in one month. I advised them to call or return sooner if they have any questions or concerns related to their recovery or treatment.  ------------------------------------------------  Thea Silversmith, MD

## 2014-04-29 ENCOUNTER — Ambulatory Visit: Payer: BC Managed Care – PPO

## 2014-04-30 ENCOUNTER — Ambulatory Visit: Payer: BC Managed Care – PPO

## 2014-05-01 ENCOUNTER — Ambulatory Visit: Payer: BC Managed Care – PPO

## 2014-05-02 ENCOUNTER — Ambulatory Visit: Payer: BC Managed Care – PPO

## 2014-05-05 ENCOUNTER — Ambulatory Visit: Payer: BC Managed Care – PPO

## 2014-05-06 ENCOUNTER — Ambulatory Visit: Payer: BC Managed Care – PPO

## 2014-05-07 ENCOUNTER — Ambulatory Visit: Payer: BC Managed Care – PPO

## 2014-05-08 ENCOUNTER — Ambulatory Visit: Payer: BC Managed Care – PPO

## 2014-05-08 ENCOUNTER — Other Ambulatory Visit: Payer: Self-pay | Admitting: Internal Medicine

## 2014-05-09 ENCOUNTER — Ambulatory Visit: Payer: BC Managed Care – PPO

## 2014-05-12 ENCOUNTER — Ambulatory Visit: Payer: BC Managed Care – PPO

## 2014-05-13 ENCOUNTER — Ambulatory Visit: Payer: BC Managed Care – PPO

## 2014-05-14 ENCOUNTER — Ambulatory Visit: Payer: BC Managed Care – PPO

## 2014-05-19 ENCOUNTER — Encounter: Payer: Self-pay | Admitting: Internal Medicine

## 2014-05-20 ENCOUNTER — Telehealth: Payer: Self-pay | Admitting: Hematology and Oncology

## 2014-05-20 ENCOUNTER — Ambulatory Visit (HOSPITAL_BASED_OUTPATIENT_CLINIC_OR_DEPARTMENT_OTHER): Payer: BC Managed Care – PPO | Admitting: Hematology and Oncology

## 2014-05-20 DIAGNOSIS — Z170 Estrogen receptor positive status [ER+]: Secondary | ICD-10-CM

## 2014-05-20 DIAGNOSIS — C50411 Malignant neoplasm of upper-outer quadrant of right female breast: Secondary | ICD-10-CM

## 2014-05-20 MED ORDER — ANASTROZOLE 1 MG PO TABS: 1.0000 mg | ORAL_TABLET | Freq: Every day | ORAL | Status: DC

## 2014-05-20 NOTE — Assessment & Plan Note (Signed)
Right breast invasive ductal carcinoma T2, N0, M0 stage II A3 0.9 cm and a separate nodule 0.25 cm ER/PR positive HER-2 negative, Oncotype DX recurrence score is 14 (9% risk of distant recurrence over 10 years with tamoxifen alone) status post radiation therapy, we plan to start antiestrogen therapy in January 2016 ( by patient preference after her daughter's the liver that babies)  Aromatase inhibitor counseling:We discussed the risks and benefits of anti-estrogen therapy with aromatase inhibitors. These include but not limited to insomnia, hot flashes, mood changes, vaginal dryness, bone density loss, and weight gain. Although rare, serious side effects including endometrial cancer, risk of blood clots were also discussed. We strongly believe that the benefits far outweigh the risks. Patient understands these risks and consented to starting treatment. Planned treatment duration is 5 years.Patient is concerned about the risk of osteoporosis since she has a family history and she was told that she has osteopenia. I would like to obtain a bone density test for further evaluation.  I discussed that if she cannot take aromatase inhibitors alternative could be tamoxifen which does not cause osteoporosis.  Return to clinic in February for follow up and toxicity evaluation on antiestrogen therapy.

## 2014-05-20 NOTE — Telephone Encounter (Signed)
per pof to sch pt appt-gave pt copy of sch °

## 2014-05-20 NOTE — Progress Notes (Signed)
Patient Care Team: Burnis Medin, MD as PCP - General Floyce Stakes. Pamala Hurry, MD as Attending Physician (Obstetrics and Gynecology) Coral Spikes, MD (Gastroenterology) Fabio Pierce, MD (Ophthalmology) Fanny Skates, MD as Consulting Physician (General Surgery) Thea Silversmith, MD as Consulting Physician (Radiation Oncology)  DIAGNOSIS: Breast cancer of upper-outer quadrant of right female breast   Staging form: Breast, AJCC 7th Edition     Clinical: Stage 0 (Tis (DCIS), N0, cM0) - Unsigned       Staging comments: Staged at breast conference 01/01/14.      Pathologic: No stage assigned - Unsigned   SUMMARY OF ONCOLOGIC HISTORY:   Breast cancer of upper-outer quadrant of right female breast   12/24/2013 Initial Diagnosis Breast cancer of upper-outer quadrant of right female breast: Initial biopsy showed DCIS with calcifications and atypical ductal hyperplasia with suspicion of stromal invasion ER 100% PR 100%   01/20/2014 Surgery Right breast lumpectomy, IDC grade 1; 3.9 cm and 0.25 cm with DCIS int grade with ALH 3 SLN negative superior margin positive, ER 100% PR 100% HER-2 negative ratio 1.1 Ki-67 3% Oncotype 14 low risk    02/05/2014 Surgery Reexcision of the superior margin no residual cancer   04/17/2014 - 05/16/2014 Radiation Therapy Adjuvant radiation therapy    CHIEF COMPLIANT: Followup after radiation therapy  INTERVAL HISTORY: Donna Manning is a 63 year old Caucasian lady with above-mentioned history of right-sided breast cancer treated with lumpectomy and had stage II A. Disease. Oncotype DX revealed that she had low risk of recurrence and hence did not require chemotherapy. She underwent radiation therapy and had tolerated it very well. She is slightly sore in the breast but otherwise doing well. She is yesterday to discuss adjuvant antiestrogen therapy options.  REVIEW OF SYSTEMS:   Constitutional: Denies fevers, chills or abnormal weight loss Eyes: Denies blurriness of  vision Ears, nose, mouth, throat, and face: Denies mucositis or sore throat Respiratory: Denies cough, dyspnea or wheezes Cardiovascular: Denies palpitation, chest discomfort or lower extremity swelling Gastrointestinal:  Denies nausea, heartburn or change in bowel habits Skin: Denies abnormal skin rashes Lymphatics: Denies new lymphadenopathy or easy bruising Neurological:Denies numbness, tingling or new weaknesses Behavioral/Psych: Mood is stable, no new changes  Breast: slightly sore in the right breast from recent radiation. All other systems were reviewed with the patient and are negative.  I have reviewed the past medical history, past surgical history, social history and family history with the patient and they are unchanged from previous note.  ALLERGIES:  is allergic to actonel; contrast media; and starch.  MEDICATIONS:  Current Outpatient Prescriptions  Medication Sig Dispense Refill  . acyclovir (ZOVIRAX) 400 MG tablet TAKE 1 TAB THREE TIMES A DAY (PATIENT NEEDS TO SCHEDULE APPT BEFORE NEXT REFILL) 30 tablet 5  . levothyroxine (SYNTHROID, LEVOTHROID) 88 MCG tablet TAKE ONE TABLET BY MOUTH ONCE DAILY 90 tablet 3  . non-metallic deodorant (ALRA) MISC Apply 1 application topically daily as needed.    Marland Kitchen omeprazole (PRILOSEC) 40 MG capsule Take 1 capsule (40 mg total) by mouth daily. 90 capsule 3  . ZOVIRAX 5 % APPLY TO COL SORE EVERY 2-3 HOURS AT ONSET OR AS DIRECTED 5 g 0  . anastrozole (ARIMIDEX) 1 MG tablet Take 1 tablet (1 mg total) by mouth daily. 30 tablet 0   No current facility-administered medications for this visit.    PHYSICAL EXAMINATION: ECOG PERFORMANCE STATUS: 0 - Asymptomatic  Filed Vitals:   05/20/14 1048  BP: 110/61  Pulse: 79  Temp:  98.3 F (36.8 C)  Resp: 18   Filed Weights   05/20/14 1048  Weight: 202 lb (91.627 kg)    GENERAL:alert, no distress and comfortable SKIN: skin color, texture, turgor are normal, no rashes or significant  lesions EYES: normal, Conjunctiva are pink and non-injected, sclera clear OROPHARYNX:no exudate, no erythema and lips, buccal mucosa, and tongue normal  NECK: supple, thyroid normal size, non-tender, without nodularity LYMPH:  no palpable lymphadenopathy in the cervical, axillary or inguinal LUNGS: clear to auscultation and percussion with normal breathing effort HEART: regular rate & rhythm and no murmurs and no lower extremity edema ABDOMEN:abdomen soft, non-tender and normal bowel sounds Musculoskeletal:no cyanosis of digits and no clubbing  NEURO: alert & oriented x 3 with fluent speech, no focal motor/sensory deficits  LABORATORY DATA:  I have reviewed the data as listed   Chemistry      Component Value Date/Time   NA 139 03/26/2014 0837   NA 143 01/01/2014 0838   K 4.4 03/26/2014 0837   K 3.9 01/01/2014 0838   CL 106 03/26/2014 0837   CO2 29 03/26/2014 0837   CO2 26 01/01/2014 0838   BUN 12 03/26/2014 0837   BUN 12.9 01/01/2014 0838   CREATININE 0.8 03/26/2014 0837   CREATININE 0.9 01/01/2014 0838      Component Value Date/Time   CALCIUM 9.3 03/26/2014 0837   CALCIUM 9.2 01/01/2014 0838   CALCIUM 9.8 08/18/2006 2105   ALKPHOS 48 03/26/2014 0837   ALKPHOS 51 01/01/2014 0838   AST 21 03/26/2014 0837   AST 19 01/01/2014 0838   ALT 26 03/26/2014 0837   ALT 22 01/01/2014 0838   BILITOT 0.5 03/26/2014 0837   BILITOT 0.39 01/01/2014 0838       Lab Results  Component Value Date   WBC 4.9 03/26/2014   HGB 12.6 03/26/2014   HCT 38.2 03/26/2014   MCV 91.0 03/26/2014   PLT 248.0 03/26/2014   NEUTROABS 2.3 03/26/2014   ASSESSMENT & PLAN:  Breast cancer of upper-outer quadrant of right female breast Right breast invasive ductal carcinoma T2, N0, M0 stage II A3 0.9 cm and a separate nodule 0.25 cm ER/PR positive HER-2 negative, Oncotype DX recurrence score is 14 (9% risk of distant recurrence over 10 years with tamoxifen alone) status post radiation therapy, we plan to  start antiestrogen therapy in January 2016 ( by patient preference after her daughter's the liver that babies)  Aromatase inhibitor counseling:We discussed the risks and benefits of anti-estrogen therapy with aromatase inhibitors. These include but not limited to insomnia, hot flashes, mood changes, vaginal dryness, bone density loss, and weight gain. Although rare, serious side effects including endometrial cancer, risk of blood clots were also discussed. We strongly believe that the benefits far outweigh the risks. Patient understands these risks and consented to starting treatment. Planned treatment duration is 5 years.Patient is concerned about the risk of osteoporosis since she has a family history and she was told that she has osteopenia. I would like to obtain a bone density test for further evaluation.  I discussed that if she cannot take aromatase inhibitors alternative could be tamoxifen which does not cause osteoporosis.  Return to clinic in February for follow up and toxicity evaluation on antiestrogen therapy.     Orders Placed This Encounter  Procedures  . DG Bone Density    Standing Status: Future     Number of Occurrences:      Standing Expiration Date: 05/20/2015    Order Specific  Question:  Reason for Exam (SYMPTOM  OR DIAGNOSIS REQUIRED)    Answer:  post menopausal starting aromatase inhibitor therapy    Order Specific Question:  Preferred imaging location?    Answer:  [facility]   The patient has a good understanding of the overall plan. she agrees with it. She will call with any problems that may develop before her next visit here.  I spent 20 minutes counseling the patient face to face. The total time spent in the appointment was 25 minutes and more than 50% was on counseling and review of test results    Rulon Eisenmenger, MD 05/20/2014 11:42 AM

## 2014-05-24 NOTE — Progress Notes (Signed)
Name: Donna Manning   MRN: [id number]  Date:  04/18/14   DOB: 08-06-1950  Status:outpatient    DIAGNOSIS: Breast Cancer  CONSENT VERIFIED: yes   SET UP: Patient is setup supine   IMMOBILIZATION:  The following immobilization was used:Custom Moldable Pillow, breast board.   NARRATIVE: [name] underwent complex simulation and treatment planning for her boost treatment today.  Her tumor volume was outlined on the planning CT scan.  Due to the depth of her cavity, electrons could not be used and a photon plan was developed. The plan will be prescribed to the  100% isodose line.   I personally supervised and approved the creation of 3 unique MLCs comprising 3   treatment devices.

## 2014-05-24 NOTE — Progress Notes (Signed)
  Radiation Oncology         [phone]) [phone] ________________________________  Name: Donna Manning MRN: [id number]  Date: 04/23/14  DOB: [birth year]  Simulation Verification Note  Status: outpatient  NARRATIVE: The patient was brought to the treatment unit and placed in the planned treatment position. The clinical setup was verified. Then port films were obtained and uploaded to the radiation oncology medical record software.  The treatment beams were carefully compared against the planned radiation fields. The position location and shape of the radiation fields was reviewed. The targeted volume of tissue appears appropriately covered by the radiation beams. Organs at risk appear to be excluded as planned.  Based on my personal review, I approved the simulation verification. The patient's treatment will proceed as planned.  ------------------------------------------------  Thea Silversmith, MD

## 2014-05-29 ENCOUNTER — Ambulatory Visit
Admission: RE | Admit: 2014-05-29 | Discharge: 2014-05-29 | Disposition: A | Discharge status: Home / Self Care | Payer: BC Managed Care – PPO | Source: Ambulatory Visit | Attending: Radiation Oncology | Admitting: Radiation Oncology

## 2014-05-29 DIAGNOSIS — C50411 Malignant neoplasm of upper-outer quadrant of right female breast: Secondary | ICD-10-CM

## 2014-05-30 ENCOUNTER — Encounter: Payer: Self-pay | Admitting: Radiation Oncology

## 2014-05-30 NOTE — Progress Notes (Signed)
   Department of Radiation Oncology  Phone:  [phone] Fax:        [phone]   Name: Donna Manning MRN: [id number]  DOB: [birth year]  Date: 05/29/2014  Follow Up Visit Note  Diagnosis: Breast cancer of upper-outer quadrant of right female breast   Staging form: Breast, AJCC 7th Edition     Clinical: Stage 0 (Tis (DCIS), N0, cM0) - Unsigned       Staging comments: Staged at breast conference 01/01/14.  Summary and Interval since last radiation: 1 month from 50 gy in 20 fractions completed 04/25/14   Interval History: Donna Manning presents today for routine followup.  She is feeling well. She is pleased with how her skin has healed up except a dark ring around her nipple.  She is going to start her anti estrogen in January and is seeing Dr. Lindi Adie back in February.   Physical Exam:  There were no vitals filed for this visit. Pleasant. No distress. Skin is still very slightly pink over the breast. Slightly dark ring around the outside of the areola.   IMPRESSION: [name] is a 63 y.o. female s/p radiation with resolving acute effects of treatment.   PLAN:  She is doing well. We discussed the need for follow up every 4-6 months which she has scheduled.  We discussed the need for yearly mammograms which she can schedule with her OBGYN or with medical oncology. We discussed the need for sun protection in the treated area.  She can always call me with questions.  I will follow up with her on an as needed basis.    Thea Silversmith, MD

## 2014-06-16 ENCOUNTER — Ambulatory Visit
Admission: RE | Admit: 2014-06-16 | Discharge: 2014-06-16 | Disposition: A | Discharge status: Home / Self Care | Payer: BC Managed Care – PPO | Source: Ambulatory Visit | Attending: Hematology and Oncology | Admitting: Hematology and Oncology

## 2014-06-16 DIAGNOSIS — C50411 Malignant neoplasm of upper-outer quadrant of right female breast: Secondary | ICD-10-CM

## 2014-07-02 ENCOUNTER — Telehealth: Payer: Self-pay

## 2014-07-02 NOTE — Telephone Encounter (Signed)
Bone Density results dtd 06/16/50 rcvd the [place].  Copy to Dr Lindi Adie.  Sent to scan.

## 2014-07-18 HISTORY — PX: UPPER GASTROINTESTINAL ENDOSCOPY: SHX188

## 2014-08-05 ENCOUNTER — Telehealth: Payer: Self-pay | Admitting: *Deleted

## 2014-08-05 NOTE — Telephone Encounter (Signed)
MESSAGE LEFT ON VM FOR TRIAGE-   " I need Dr Geralyn Flash nurse to call me back "  No return number left- number per phone id is

## 2014-08-12 ENCOUNTER — Telehealth: Payer: Self-pay | Admitting: *Deleted

## 2014-08-12 NOTE — Telephone Encounter (Signed)
Pt called to follow up per prior call regarding concern that she is currently without medication coverage and she has 4 tablets of anastrazole left.  Donna Manning is inquiring if this office has samples.  Per check with pharmacy no samples available.  Per inquiry with Dr Lindi Adie - advised it is ok to hold anastrazole until March when she has coverage.  Pt aware of above as well as she requested to reschedule her appointment to March when she has known medical coverage.  POF sent to scheduling.

## 2014-08-13 ENCOUNTER — Telehealth: Payer: Self-pay | Admitting: Hematology and Oncology

## 2014-08-13 NOTE — Telephone Encounter (Signed)
, °

## 2014-08-15 ENCOUNTER — Other Ambulatory Visit: Payer: Self-pay | Admitting: *Deleted

## 2014-08-15 DIAGNOSIS — C50411 Malignant neoplasm of upper-outer quadrant of right female breast: Secondary | ICD-10-CM

## 2014-08-15 MED ORDER — ANASTROZOLE 1 MG PO TABS: 1.0000 mg | ORAL_TABLET | Freq: Every day | ORAL | Status: DC

## 2014-08-25 ENCOUNTER — Ambulatory Visit: Payer: BC Managed Care – PPO | Admitting: Hematology and Oncology

## 2014-09-02 ENCOUNTER — Telehealth: Payer: Self-pay | Admitting: Internal Medicine

## 2014-09-02 DIAGNOSIS — R109 Unspecified abdominal pain: Secondary | ICD-10-CM

## 2014-09-02 DIAGNOSIS — K219 Gastro-esophageal reflux disease without esophagitis: Secondary | ICD-10-CM

## 2014-09-02 NOTE — Telephone Encounter (Signed)
Pt is having some gi issues and will be able to go to whichever dr she wants in March do to having medicare insurance.  Pt would like to know who you would recommend within [facility]. (which dr ) Pt was just put on new cancer drug and there is bone loss associated with it.  Pt would like to speak w/ someone up on the lastest info. Also will need endoscopy fairly soon as well. pls advise

## 2014-09-02 NOTE — Telephone Encounter (Signed)
Please refer to either  Dr Olevia Perches if not taking new patients then  gessner ,perry . Needs to talk with the  Person who is prescribing the cancer med about the bone health and then can fu at next visit .

## 2014-09-03 NOTE — Telephone Encounter (Signed)
Order placed in the system.  Pt notified and advised she speak to oncology about her medications.

## 2014-09-10 ENCOUNTER — Encounter: Payer: Self-pay | Admitting: Gastroenterology

## 2014-09-23 ENCOUNTER — Telehealth: Payer: Self-pay

## 2014-09-23 ENCOUNTER — Ambulatory Visit (HOSPITAL_BASED_OUTPATIENT_CLINIC_OR_DEPARTMENT_OTHER): Payer: Medicare Other | Admitting: Hematology and Oncology

## 2014-09-23 ENCOUNTER — Other Ambulatory Visit: Payer: Self-pay

## 2014-09-23 ENCOUNTER — Telehealth: Payer: Self-pay | Admitting: Hematology and Oncology

## 2014-09-23 VITALS — BP 107/58 | HR 77 | Temp 97.9°F | Resp 18 | Ht 67.0 in | Wt 197.5 lb

## 2014-09-23 DIAGNOSIS — C50411 Malignant neoplasm of upper-outer quadrant of right female breast: Secondary | ICD-10-CM

## 2014-09-23 DIAGNOSIS — Z170 Estrogen receptor positive status [ER+]: Secondary | ICD-10-CM

## 2014-09-23 MED ORDER — ANASTROZOLE 1 MG PO TABS: 1.0000 mg | ORAL_TABLET | Freq: Every day | ORAL | Status: DC

## 2014-09-23 MED ORDER — TAMOXIFEN CITRATE 20 MG PO TABS
20.0000 mg | ORAL_TABLET | Freq: Every day | ORAL | Status: DC
Start: 2014-09-23 — End: 2015-06-22

## 2014-09-23 NOTE — Telephone Encounter (Signed)
S/w pt she is turning 70 and chose [employer] for insurance. She will need a refill on anastrazole. The new rx request from [facility] given to dr Geralyn Flash nurse for the anastrazole.

## 2014-09-23 NOTE — Telephone Encounter (Signed)
appts made and avs printed for pt  Donna Manning °

## 2014-09-23 NOTE — Progress Notes (Signed)
Patient Care Team: Burnis Medin, MD as PCP - General [name], MD as Attending Physician (Obstetrics and Gynecology) Coral Spikes, MD (Gastroenterology) Sharyne Peach, MD (Ophthalmology) Fanny Skates, MD as Consulting Physician (General Surgery) Thea Silversmith, MD as Consulting Physician (Radiation Oncology)  DIAGNOSIS: Breast cancer of upper-outer quadrant of right female breast   Staging form: Breast, AJCC 7th Edition     Clinical: Stage 0 (Tis (DCIS), N0, cM0) - Unsigned       Staging comments: Staged at breast conference 01/01/14.      Pathologic: No stage assigned - Unsigned   SUMMARY OF ONCOLOGIC HISTORY:   Breast cancer of upper-outer quadrant of right female breast   12/24/2013 Initial Diagnosis Breast cancer of upper-outer quadrant of right female breast: Initial biopsy showed DCIS with calcifications and atypical ductal hyperplasia with suspicion of stromal invasion ER 100% PR 100%   01/20/2014 Surgery Right breast lumpectomy, IDC grade 1; 3.9 cm and 0.25 cm with DCIS int grade with ALH 3 SLN negative superior margin positive, ER 100% PR 100% HER-2 negative ratio 1.1 Ki-67 3% Oncotype 14 low risk    02/05/2014 Surgery Reexcision of the superior margin no residual cancer   04/17/2014 - 05/16/2014 Radiation Therapy Adjuvant radiation therapy   07/25/2014 -  Anti-estrogen oral therapy Anastrozole 1 mg daily switched to tamoxifen 20 mg daily from 09/23/2014 due to osteoporosis    CHIEF COMPLIANT: Follow-up of breast cancer anastrozole  INTERVAL HISTORY: Donna Manning is a 64 year old lady with above-mentioned history of right-sided breast cancer treated with lumpectomy and adjuvant radiation therapy. She was started on anastrozole starting in January 2016. She had a bone density test done in November and we received the report and it appears that she has osteoporosis with a T score of -2.6. She reports that she had no trouble taking anastrozole did not have any musculoskeletal  pains or heart flashes.  REVIEW OF SYSTEMS:   Constitutional: Denies fevers, chills or abnormal weight loss Eyes: Denies blurriness of vision Ears, nose, mouth, throat, and face: Denies mucositis or sore throat Respiratory: Denies cough, dyspnea or wheezes Cardiovascular: Denies palpitation, chest discomfort or lower extremity swelling Gastrointestinal:  Denies nausea, heartburn or change in bowel habits Skin: Denies abnormal skin rashes Lymphatics: Denies new lymphadenopathy or easy bruising Neurological:Denies numbness, tingling or new weaknesses Behavioral/Psych: Mood is stable, no new changes  Breast:  denies any pain or lumps or nodules in either breasts All other systems were reviewed with the patient and are negative.  I have reviewed the past medical history, past surgical history, social history and family history with the patient and they are unchanged from previous note.  ALLERGIES:  is allergic to actonel; contrast media; and starch.  MEDICATIONS:  Current Outpatient Prescriptions  Medication Sig Dispense Refill  . acyclovir (ZOVIRAX) 400 MG tablet TAKE 1 TAB THREE TIMES A DAY (PATIENT NEEDS TO SCHEDULE APPT BEFORE NEXT REFILL) 30 tablet 5  . levothyroxine (SYNTHROID, LEVOTHROID) 88 MCG tablet TAKE ONE TABLET BY MOUTH ONCE DAILY 90 tablet 3  . non-metallic deodorant (ALRA) MISC Apply 1 application topically daily as needed.    Marland Kitchen omeprazole (PRILOSEC) 40 MG capsule Take 1 capsule (40 mg total) by mouth daily. 90 capsule 3  . ZOVIRAX 5 % APPLY TO COL SORE EVERY 2-3 HOURS AT ONSET OR AS DIRECTED 5 g 0  . tamoxifen (NOLVADEX) 20 MG tablet Take 1 tablet (20 mg total) by mouth daily. 90 tablet 3   No current facility-administered medications  for this visit.    PHYSICAL EXAMINATION: ECOG PERFORMANCE STATUS: 1 - Symptomatic but completely ambulatory  Filed Vitals:   09/23/14 1047  BP: 107/58  Pulse: 77  Temp: 97.9 F (36.6 C)  Resp: 18   Filed Weights   09/23/14 1047   Weight: 197 lb 8 oz (89.585 kg)    GENERAL:alert, no distress and comfortable SKIN: skin color, texture, turgor are normal, no rashes or significant lesions EYES: normal, Conjunctiva are pink and non-injected, sclera clear OROPHARYNX:no exudate, no erythema and lips, buccal mucosa, and tongue normal  NECK: supple, thyroid normal size, non-tender, without nodularity LYMPH:  no palpable lymphadenopathy in the cervical, axillary or inguinal LUNGS: clear to auscultation and percussion with normal breathing effort HEART: regular rate & rhythm and no murmurs and no lower extremity edema ABDOMEN:abdomen soft, non-tender and normal bowel sounds Musculoskeletal:no cyanosis of digits and no clubbing  NEURO: alert & oriented x 3 with fluent speech, no focal motor/sensory deficits  LABORATORY DATA:  I have reviewed the data as listed   Chemistry      Component Value Date/Time   NA 139 03/26/2014 0837   NA 143 01/01/2014 0838   K 4.4 03/26/2014 0837   K 3.9 01/01/2014 0838   CL 106 03/26/2014 0837   CO2 29 03/26/2014 0837   CO2 26 01/01/2014 0838   BUN 12 03/26/2014 0837   BUN 12.9 01/01/2014 0838   CREATININE 0.8 03/26/2014 0837   CREATININE 0.9 01/01/2014 0838      Component Value Date/Time   CALCIUM 9.3 03/26/2014 0837   CALCIUM 9.2 01/01/2014 0838   CALCIUM 9.8 08/18/2006 2105   ALKPHOS 48 03/26/2014 0837   ALKPHOS 51 01/01/2014 0838   AST 21 03/26/2014 0837   AST 19 01/01/2014 0838   ALT 26 03/26/2014 0837   ALT 22 01/01/2014 0838   BILITOT 0.5 03/26/2014 0837   BILITOT 0.39 01/01/2014 0838       Lab Results  Component Value Date   WBC 4.9 03/26/2014   HGB 12.6 03/26/2014   HCT 38.2 03/26/2014   MCV 91.0 03/26/2014   PLT 248.0 03/26/2014   NEUTROABS 2.3 03/26/2014     RADIOGRAPHIC STUDIES: I have personally reviewed the radiology reports and agreed with their findings. Bone density showed T score of -2.6  ASSESSMENT & PLAN:  Breast cancer of upper-outer  quadrant of right female breast Right breast invasive ductal carcinoma T2, N0, M0 stage II A3 0.9 cm and a separate nodule 0.25 cm ER/PR positive HER-2 negative, Oncotype DX recurrence score is 14 (9% risk of distant recurrence over 10 years with tamoxifen alone) status post radiation therapy, started antiestrogen therapy with anastrozole in January 2016 years switched to tamoxifen 09/23/2014  Anastrozole toxicities: No major toxicities to anastrozole.  DEXA scan November 2015 showed a T score of -2.6 suggestive of osteoporosis. Hence I recommended changing treatment to tamoxifen. I recommended oral bisphosphonate therapy along with calcium and vitamin D. I recommended Prolia to start in 3 months to be given once every 6 months 2 years and then we will give her a treatment break and then resume later on.   Breast cancer surveillance:  Mammograms to be done in May 2016    Return to clinic in 3 months for follow-up       Orders Placed This Encounter  Procedures  . CBC with Differential    Standing Status: Future     Number of Occurrences:      Standing Expiration  Date: 09/23/2015  . Comprehensive metabolic panel (Cmet) - [facility]    Standing Status: Future     Number of Occurrences:      Standing Expiration Date: 09/23/2015   The patient has a good understanding of the overall plan. she agrees with it. She will call with any problems that may develop before her next visit here.   Rulon Eisenmenger, MD

## 2014-09-23 NOTE — Assessment & Plan Note (Signed)
Right breast invasive ductal carcinoma T2, N0, M0 stage II A3 0.9 cm and a separate nodule 0.25 cm ER/PR positive HER-2 negative, Oncotype DX recurrence score is 14 (9% risk of distant recurrence over 10 years with tamoxifen alone) status post radiation therapy, started antiestrogen therapy with anastrozole in January 2016.  Anastrozole toxicities: DEXA scan November 2015 showed a T score of -2.6 suggestive of osteoporosis. Hence I recommended changing treatment to tamoxifen. I recommended oral bisphosphonate therapy along with calcium and vitamin D  Return to clinic in 3 months for follow-up

## 2014-09-26 ENCOUNTER — Telehealth: Payer: Self-pay | Admitting: *Deleted

## 2014-09-26 MED ORDER — LEVOTHYROXINE SODIUM 88 MCG PO TABS: ORAL_TABLET | ORAL | Status: DC

## 2014-09-26 MED ORDER — OMEPRAZOLE 40 MG PO CPDR: 40.0000 mg | DELAYED_RELEASE_CAPSULE | Freq: Every day | ORAL | Status: DC

## 2014-09-26 NOTE — Telephone Encounter (Signed)
Sent to the pharmacy by e-scribe. 

## 2014-09-26 NOTE — Telephone Encounter (Signed)
Refill levothyroxine 88 mcg once daily and omeprazole 40 mg once daily [employer]

## 2014-10-27 ENCOUNTER — Ambulatory Visit (INDEPENDENT_AMBULATORY_CARE_PROVIDER_SITE_OTHER): Payer: Medicare Other | Admitting: Gastroenterology

## 2014-10-27 ENCOUNTER — Encounter: Payer: Self-pay | Admitting: Gastroenterology

## 2014-10-27 VITALS — BP 102/70 | HR 96 | Ht 67.0 in | Wt 197.1 lb

## 2014-10-27 DIAGNOSIS — K2270 Barrett's esophagus without dysplasia: Secondary | ICD-10-CM | POA: Diagnosis not present

## 2014-10-27 DIAGNOSIS — K219 Gastro-esophageal reflux disease without esophagitis: Secondary | ICD-10-CM

## 2014-10-27 DIAGNOSIS — C50411 Malignant neoplasm of upper-outer quadrant of right female breast: Secondary | ICD-10-CM | POA: Diagnosis not present

## 2014-10-27 DIAGNOSIS — Z1211 Encounter for screening for malignant neoplasm of colon: Secondary | ICD-10-CM | POA: Insufficient documentation

## 2014-10-27 DIAGNOSIS — R1013 Epigastric pain: Secondary | ICD-10-CM | POA: Diagnosis not present

## 2014-10-27 NOTE — Assessment & Plan Note (Addendum)
By the patient's report she was not well prepped.  There is no comment on the colonoscopy report.  Plan follow-up colonoscopy 2019

## 2014-10-27 NOTE — Assessment & Plan Note (Signed)
Symptoms could be due to ulcer or nonulcer dyspepsia H. pylori should be ruled out.  Recommendations #1 upper endoscopy #2 continue omeprazole

## 2014-10-27 NOTE — Assessment & Plan Note (Addendum)
Plan followup endoscopy 

## 2014-10-27 NOTE — Patient Instructions (Signed)

## 2014-10-27 NOTE — Assessment & Plan Note (Signed)
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  He is concerned about chronic omeprazole use because of her osteoporosis.  Would consider medication change pending results of endoscopy.

## 2014-10-27 NOTE — Progress Notes (Signed)
_                                                                                                                History of Present Illness:  Ms. Donna Manning is a 64 year old white female with history of breast cancer and Barrett's esophagus referred at the request of Dr. Tomasita Crumble for evaluation of abdominal discomfort.  For several weeks she's been complaining of early satiety and postprandial fullness.  She suffers from reflux which she takes omeprazole every day or every other day.  She cannot discontinue this medication without rebound pyrosis.  She denies dysphagia.  For several weeks she's been complaining of vague upper abdominal discomfort with early satiety, although it has improved in the past week.  Weight has been stable.  She's on no gastric irritants including nonsteroidals.  Barrett's esophagus was diagnosed in 2009.  She has a remote history of a gastric ulcer.  She underwent colonoscopy in 2012 where a hyperplastic polyp was removed.  Report is not available but the patient claims that she was poorly prepped.  She has osteoporosis and is concerned about continuing to use omeprazole.  Recommendations #1   Past Medical History  Diagnosis Date  . GERD (gastroesophageal reflux disease)   . Barrett's esophagus 2009    on EGD  . Hypothyroidism   . History of DVT (deep vein thrombosis) > 30 years ago    after a pregnancy  . History of gastric ulcer     secondary to ASA use  . Dental crowns present   . Breast cancer 01/2014    right  . Radiation 03/31/14-04/25/14    Right Breast   Past Surgical History  Procedure Laterality Date  . Upper gi endoscopy    . Colonoscopy    . Partial mastectomy with needle localization and axillary sentinel lymph node bx Right 01/20/2014    Procedure: RIGHT PARTIAL MASTECTOMY WITH DOUBLE  NEEDLE LOCALIZATION (BRACKETED )AND AXILLARY SENTINEL LYMPH NODE BX;  Surgeon: [name], MD;  Location: [place];  Service:  General;  Laterality: Right;  And [place].  . Breast biopsy Right 1990  . Re-excision of breast cancer,superior margins Right 02/05/2014    Procedure: RIGHT LUMPECTOMY, RE-EXCISION OF BREAST CANCER,SUPERIOR MARGINS;  Surgeon: [name], MD;  Location: [place];  Service: General;  Laterality: Right;   family history includes Diabetes in her mother; Heart failure (age of onset: 72) in her mother; Parkinsonism (age of onset: 35) in her father; Stomach cancer in her paternal grandfather; Stroke in her father; Thyroid disease in her sister. Current Outpatient Prescriptions  Medication Sig Dispense Refill  . levothyroxine (SYNTHROID, LEVOTHROID) 88 MCG tablet TAKE ONE TABLET BY MOUTH ONCE DAILY 90 tablet 1  . Multiple Vitamins-Minerals (CENTRUM SILVER PO) Take 1 tablet by mouth daily.    Marland Kitchen omeprazole (PRILOSEC) 40 MG capsule Take 1 capsule (40 mg total) by mouth daily. 90 capsule 1  . tamoxifen (NOLVADEX) 20 MG tablet Take 1 tablet (20 mg  total) by mouth daily. 90 tablet 3   No current facility-administered medications for this visit.   Allergies as of 10/27/2014 - Review Complete 10/27/2014  Allergen Reaction Noted  . Actonel [risedronate sodium] Other (See Comments) 12/03/2011  . Contrast media [iodinated diagnostic agents] Hives 01/13/2014  . Starch Rash 01/01/2014    reports that she has never smoked. She has never used smokeless tobacco. She reports that she does not drink alcohol or use illicit drugs.   Review of Systems: Pertinent positive and negative review of systems were noted in the above HPI section. All other review of systems were otherwise negative.  Vital signs were reviewed in today's medical record Physical Exam: General: Well developed , well nourished, no acute distress Skin: anicteric Head: Normocephalic and atraumatic Eyes:  sclerae anicteric, EOMI Ears: Normal auditory acuity Mouth: No deformity or lesions Neck: Supple, no masses or  thyromegaly Lungs: Clear throughout to auscultation Heart: Regular rate and rhythm; no murmurs, rubs or bruits Abdomen: Soft, non tender and non distended. No masses, hepatosplenomegaly or hernias noted. Normal Bowel sounds Rectal:deferred Musculoskeletal: Symmetrical with no gross deformities  Skin: No lesions on visible extremities Pulses:  Normal pulses noted Extremities: No clubbing, cyanosis, edema or deformities noted Neurological: Alert oriented x 4, grossly nonfocal Cervical Nodes:  No significant cervical adenopathy Inguinal Nodes: No significant inguinal adenopathy Psychological:  Alert and cooperative. Normal mood and affect  See Assessment and Plan under Problem List

## 2014-11-28 ENCOUNTER — Other Ambulatory Visit: Payer: Self-pay

## 2014-11-28 ENCOUNTER — Other Ambulatory Visit: Payer: Self-pay | Admitting: Radiation Oncology

## 2014-11-28 DIAGNOSIS — Z1231 Encounter for screening mammogram for malignant neoplasm of breast: Secondary | ICD-10-CM

## 2014-12-10 ENCOUNTER — Ambulatory Visit
Admission: RE | Admit: 2014-12-10 | Discharge: 2014-12-10 | Disposition: A | Discharge status: Home / Self Care | Payer: Medicare Other | Source: Ambulatory Visit

## 2014-12-10 DIAGNOSIS — Z1231 Encounter for screening mammogram for malignant neoplasm of breast: Secondary | ICD-10-CM

## 2014-12-10 DIAGNOSIS — R922 Inconclusive mammogram: Secondary | ICD-10-CM | POA: Diagnosis not present

## 2014-12-10 DIAGNOSIS — Z853 Personal history of malignant neoplasm of breast: Secondary | ICD-10-CM | POA: Diagnosis not present

## 2014-12-25 ENCOUNTER — Other Ambulatory Visit: Payer: Self-pay

## 2014-12-25 ENCOUNTER — Encounter: Payer: Self-pay | Admitting: Gastroenterology

## 2014-12-25 ENCOUNTER — Ambulatory Visit (AMBULATORY_SURGERY_CENTER): Payer: Medicare Other | Admitting: Gastroenterology

## 2014-12-25 VITALS — BP 140/74 | HR 64 | Temp 97.9°F | Resp 21 | Ht 67.0 in | Wt 197.0 lb

## 2014-12-25 DIAGNOSIS — K208 Other esophagitis: Secondary | ICD-10-CM | POA: Diagnosis not present

## 2014-12-25 DIAGNOSIS — K210 Gastro-esophageal reflux disease with esophagitis: Secondary | ICD-10-CM | POA: Diagnosis not present

## 2014-12-25 DIAGNOSIS — K2970 Gastritis, unspecified, without bleeding: Secondary | ICD-10-CM | POA: Diagnosis not present

## 2014-12-25 DIAGNOSIS — E039 Hypothyroidism, unspecified: Secondary | ICD-10-CM | POA: Diagnosis not present

## 2014-12-25 DIAGNOSIS — R1013 Epigastric pain: Secondary | ICD-10-CM | POA: Diagnosis present

## 2014-12-25 DIAGNOSIS — K2270 Barrett's esophagus without dysplasia: Secondary | ICD-10-CM

## 2014-12-25 HISTORY — PX: UPPER GI ENDOSCOPY: SHX6162

## 2014-12-25 MED ORDER — RANITIDINE HCL 150 MG PO TABS: 300.0000 mg | ORAL_TABLET | Freq: Two times a day (BID) | ORAL | Status: DC

## 2014-12-25 MED ORDER — SODIUM CHLORIDE 0.9 % IV SOLN: 500.0000 mL | INTRAVENOUS | Status: DC

## 2014-12-25 NOTE — Progress Notes (Signed)
Stable to RR 

## 2014-12-25 NOTE — Progress Notes (Signed)
Called to room to assist during endoscopic procedure.  Patient ID and intended procedure confirmed with present staff. Received instructions for my participation in the procedure from the performing physician.  

## 2014-12-25 NOTE — Op Note (Signed)
[facility]  [address] [place], [id number]   ENDOSCOPY PROCEDURE REPORT  PATIENT: Donna, Manning  MR#: [id number] BIRTHDATE: 1951/05/06 , 11  yrs. old GENDER: female ENDOSCOPIST: Inda Castle, MD REFERRED BY:  Burnis Medin, M.D. PROCEDURE DATE:  12/25/2014 PROCEDURE:  EGD w/ biopsy ASA CLASS:     Class II INDICATIONS:  dyspepsia and history of Barrett's esophagus. MEDICATIONS: Monitored anesthesia care, Propofol 100 mg IV, and Lidocaine 40 mg IV TOPICAL ANESTHETIC:  DESCRIPTION OF PROCEDURE: After the risks benefits and alternatives of the procedure were thoroughly explained, informed consent was obtained.  The LB [id number] endoscope was introduced through the mouth and advanced to the second portion of the duodenum , Without limitations.  The instrument was slowly withdrawn as the mucosa was fully examined.    ESOPHAGUS: There was evidence of Barrett's esophagus without dysplasia found.  The length of circumferential Barrett's was 1cm ([place] C1).  There was no nodular mucosa noted in the Barrett's segment.  Multiple biopsies were performed.   Except for the findings listed, the EGD was otherwise normal.  Retroflexed views revealed no abnormalities.     The scope was then withdrawn from the patient and the procedure completed.  COMPLICATIONS: There were no immediate complications.  ENDOSCOPIC IMPRESSION: 1.   There was Barrett's esophagus w/o dysplasia found; multiple biopsies were performed 2.   EGD was otherwise normal  RECOMMENDATIONS: Trial of ranitidine 300 mg twice a day; discontinue omeprazole Office visit 4-5 weeks  REPEAT EXAM:  eSigned:  Inda Castle, MD 12/25/2014 8:01 AM    CC:

## 2014-12-25 NOTE — Patient Instructions (Signed)
Discharge instructions given. Biopsies taken. Resume previous medications. YOU HAD AN ENDOSCOPIC PROCEDURE TODAY AT THE [facility]:   Refer to the procedure report that was given to you for any specific questions about what was found during the examination.  If the procedure report does not answer your questions, please call your gastroenterologist to clarify.  If you requested that your care partner not be given the details of your procedure findings, then the procedure report has been included in a sealed envelope for you to review at your convenience later.  YOU SHOULD EXPECT: Some feelings of bloating in the abdomen. Passage of more gas than usual.  Walking can help get rid of the air that was put into your GI tract during the procedure and reduce the bloating. If you had a lower endoscopy (such as a colonoscopy or flexible sigmoidoscopy) you may notice spotting of blood in your stool or on the toilet paper. If you underwent a bowel prep for your procedure, you may not have a normal bowel movement for a few days.  Please Note:  You might notice some irritation and congestion in your nose or some drainage.  This is from the oxygen used during your procedure.  There is no need for concern and it should clear up in a day or so.  SYMPTOMS TO REPORT IMMEDIATELY:   Following upper endoscopy (EGD)  Vomiting of blood or coffee ground material  New chest pain or pain under the shoulder blades  Painful or persistently difficult swallowing  New shortness of breath  Fever of 100F or higher  Black, tarry-looking stools  For urgent or emergent issues, a gastroenterologist can be reached at any hour by calling [phone].   DIET: Your first meal following the procedure should be a small meal and then it is ok to progress to your normal diet. Heavy or fried foods are harder to digest and may make you feel nauseous or bloated.  Likewise, meals heavy in dairy and vegetables can increase  bloating.  Drink plenty of fluids but you should avoid alcoholic beverages for 24 hours.  ACTIVITY:  You should plan to take it easy for the rest of today and you should NOT DRIVE or use heavy machinery until tomorrow (because of the sedation medicines used during the test).    FOLLOW UP: Our staff will call the number listed on your records the next business day following your procedure to check on you and address any questions or concerns that you may have regarding the information given to you following your procedure. If we do not reach you, we will leave a message.  However, if you are feeling well and you are not experiencing any problems, there is no need to return our call.  We will assume that you have returned to your regular daily activities without incident.  If any biopsies were taken you will be contacted by phone or by letter within the next 1-3 weeks.  Please call us at [phone] if you have not heard about the biopsies in 3 weeks.    SIGNATURES/CONFIDENTIALITY: You and/or your care partner have signed paperwork which will be entered into your electronic medical record.  These signatures attest to the fact that that the information above on your After Visit Summary has been reviewed and is understood.  Full responsibility of the confidentiality of this discharge information lies with you and/or your care-partner. 

## 2014-12-26 ENCOUNTER — Telehealth: Payer: Self-pay

## 2014-12-26 NOTE — Telephone Encounter (Signed)
  Follow up Call-  Call back number 12/25/2014  Post procedure Call Back phone  # [phone]  Permission to leave phone message Yes     Patient questions:  Do you have a fever, pain , or abdominal swelling? No. Pain Score  0 *  Have you tolerated food without any problems? Yes.    Have you been able to return to your normal activities? Yes.    Do you have any questions about your discharge instructions: Diet   No. Medications  No. Follow up visit  No.  Do you have questions or concerns about your Care? No.  Actions: * If pain score is 4 or above: No action needed, pain <4.  I spoke with the pt's husband, Gershon Mussel.  He spoke with his wife while he was on the telephone line.  Reported "she did fine".  maw

## 2014-12-31 ENCOUNTER — Encounter: Payer: Self-pay | Admitting: Gastroenterology

## 2015-01-02 ENCOUNTER — Telehealth: Payer: Self-pay | Admitting: Hematology and Oncology

## 2015-01-02 NOTE — Telephone Encounter (Signed)
Returned patients call to reschedule

## 2015-01-06 ENCOUNTER — Ambulatory Visit: Payer: Medicare Other | Admitting: Hematology and Oncology

## 2015-01-29 NOTE — Assessment & Plan Note (Addendum)
Right breast invasive ductal carcinoma T2, N0, M0 stage IIA 0.9 cm and a separate nodule 0.25 cm ER/PR positive HER-2 negative, Oncotype DX recurrence score is 14 (9% risk of distant recurrence over 10 years with tamoxifen alone) status post radiation therapy, started antiestrogen therapy with anastrozole in January 2016 years switched to tamoxifen 09/23/2014  Anastrozole toxicities: No major toxicities to anastrozole.  DEXA scan November 2015 showed a T score of -2.6 suggestive of osteoporosis. Hence I recommended changing treatment to tamoxifen. I recommended oral bisphosphonate therapy along with calcium and vitamin D. I recommended Prolia to start in 3 months to be given once every 6 months 2 years and then we will give her a treatment break and then resume later on.   Breast Cancer Surveillance: 1. Breast exam 01/30/15: Normal 2. Mammogram 12/10/14 No abnormalities. Postsurgical changes. Breast Density Category C. I recommended that she get 3-D mammograms for surveillance. Discussed the differences between different breast density categories.   RTC in 6 months

## 2015-01-30 ENCOUNTER — Ambulatory Visit (HOSPITAL_BASED_OUTPATIENT_CLINIC_OR_DEPARTMENT_OTHER): Payer: Medicare Other | Admitting: Hematology and Oncology

## 2015-01-30 ENCOUNTER — Encounter: Payer: Self-pay | Admitting: Hematology and Oncology

## 2015-01-30 ENCOUNTER — Telehealth: Payer: Self-pay | Admitting: Hematology and Oncology

## 2015-01-30 ENCOUNTER — Telehealth: Payer: Self-pay

## 2015-01-30 VITALS — BP 110/53 | HR 78 | Temp 98.0°F | Resp 18 | Ht 67.0 in | Wt 194.3 lb

## 2015-01-30 DIAGNOSIS — C50411 Malignant neoplasm of upper-outer quadrant of right female breast: Secondary | ICD-10-CM

## 2015-01-30 NOTE — Progress Notes (Signed)
Patient Care Team: Burnis Medin, MD as PCP - General [name], MD as Attending Physician (Obstetrics and Gynecology) Coral Spikes, MD (Gastroenterology) Sharyne Peach, MD (Ophthalmology) Fanny Skates, MD as Consulting Physician (General Surgery) Thea Silversmith, MD as Consulting Physician (Radiation Oncology)  DIAGNOSIS: Breast cancer of upper-outer quadrant of right female breast   Staging form: Breast, AJCC 7th Edition     Clinical: Stage 0 (Tis (DCIS), N0, cM0) - Unsigned       Staging comments: Staged at breast conference 01/01/14.      Pathologic: Stage IIA (T2(2), N0, cM0) - Unsigned   SUMMARY OF ONCOLOGIC HISTORY:   Breast cancer of upper-outer quadrant of right female breast   12/24/2013 Initial Diagnosis Breast cancer of upper-outer quadrant of right female breast: Initial biopsy showed DCIS with calcifications and atypical ductal hyperplasia with suspicion of stromal invasion ER 100% PR 100%   01/20/2014 Surgery Right breast lumpectomy, IDC grade 1; 3.9 cm and 0.25 cm with DCIS int grade with ALH 3 SLN negative superior margin positive, ER 100% PR 100% HER-2 negative ratio 1.1 Ki-67 3% Oncotype 14 low risk    02/05/2014 Surgery Reexcision of the superior margin no residual cancer   04/17/2014 - 05/16/2014 Radiation Therapy Adjuvant radiation therapy   07/25/2014 -  Anti-estrogen oral therapy Anastrozole 1 mg daily switched to tamoxifen 20 mg daily from 09/23/2014 due to osteoporosis    CHIEF COMPLIANT: Follow-up on tamoxifen  INTERVAL HISTORY: Donna Manning is a 64 year old with above-mentioned history of right breast cancer currently on tamoxifen therapy. She is tolerating it extremely well without any major problems. She was supposed to stop earlier today but she had decided not to receive it. She joined [employer] in an exercise program.  REVIEW OF SYSTEMS:   Constitutional: Denies fevers, chills or abnormal weight loss Eyes: Denies blurriness of vision Ears, nose, mouth,  throat, and face: Denies mucositis or sore throat Respiratory: Denies cough, dyspnea or wheezes Cardiovascular: Denies palpitation, chest discomfort or lower extremity swelling Gastrointestinal:  Denies nausea, heartburn or change in bowel habits Skin: Denies abnormal skin rashes Lymphatics: Denies new lymphadenopathy or easy bruising Neurological:Denies numbness, tingling or new weaknesses Behavioral/Psych: Mood is stable, no new changes  Breast:  denies any pain or lumps or nodules in either breasts All other systems were reviewed with the patient and are negative.  I have reviewed the past medical history, past surgical history, social history and family history with the patient and they are unchanged from previous note.  ALLERGIES:  is allergic to actonel; contrast media; and starch.  MEDICATIONS:  Current Outpatient Prescriptions  Medication Sig Dispense Refill  . levothyroxine (SYNTHROID, LEVOTHROID) 88 MCG tablet TAKE ONE TABLET BY MOUTH ONCE DAILY 90 tablet 1  . Multiple Vitamins-Minerals (CENTRUM SILVER PO) Take 1 tablet by mouth daily.    . ranitidine (ZANTAC) 150 MG tablet Take 2 tablets (300 mg total) by mouth 2 (two) times daily. 60 tablet 2  . tamoxifen (NOLVADEX) 20 MG tablet Take 1 tablet (20 mg total) by mouth daily. 90 tablet 3   No current facility-administered medications for this visit.    PHYSICAL EXAMINATION: ECOG PERFORMANCE STATUS: 1 - Symptomatic but completely ambulatory  Filed Vitals:   01/30/15 1015  BP: 110/53  Pulse: 78  Temp: 98 F (36.7 C)  Resp: 18   Filed Weights   01/30/15 1015  Weight: 194 lb 4.8 oz (88.134 kg)    GENERAL:alert, no distress and comfortable SKIN: skin color, texture, turgor  are normal, no rashes or significant lesions EYES: normal, Conjunctiva are pink and non-injected, sclera clear OROPHARYNX:no exudate, no erythema and lips, buccal mucosa, and tongue normal  NECK: supple, thyroid normal size, non-tender, without  nodularity LYMPH:  no palpable lymphadenopathy in the cervical, axillary or inguinal LUNGS: clear to auscultation and percussion with normal breathing effort HEART: regular rate & rhythm and no murmurs and no lower extremity edema ABDOMEN:abdomen soft, non-tender and normal bowel sounds Musculoskeletal:no cyanosis of digits and no clubbing  NEURO: alert & oriented x 3 with fluent speech, no focal motor/sensory deficits  LABORATORY DATA:  I have reviewed the data as listed   Chemistry      Component Value Date/Time   NA 139 03/26/2014 0837   NA 143 01/01/2014 0838   K 4.4 03/26/2014 0837   K 3.9 01/01/2014 0838   CL 106 03/26/2014 0837   CO2 29 03/26/2014 0837   CO2 26 01/01/2014 0838   BUN 12 03/26/2014 0837   BUN 12.9 01/01/2014 0838   CREATININE 0.8 03/26/2014 0837   CREATININE 0.9 01/01/2014 0838      Component Value Date/Time   CALCIUM 9.3 03/26/2014 0837   CALCIUM 9.2 01/01/2014 0838   CALCIUM 9.8 08/18/2006 2105   ALKPHOS 48 03/26/2014 0837   ALKPHOS 51 01/01/2014 0838   AST 21 03/26/2014 0837   AST 19 01/01/2014 0838   ALT 26 03/26/2014 0837   ALT 22 01/01/2014 0838   BILITOT 0.5 03/26/2014 0837   BILITOT 0.39 01/01/2014 0838       Lab Results  Component Value Date   WBC 4.9 03/26/2014   HGB 12.6 03/26/2014   HCT 38.2 03/26/2014   MCV 91.0 03/26/2014   PLT 248.0 03/26/2014   NEUTROABS 2.3 03/26/2014   ASSESSMENT & PLAN:  Breast cancer of upper-outer quadrant of right female breast Right breast invasive ductal carcinoma T2, N0, M0 stage IIA 0.9 cm and a separate nodule 0.25 cm ER/PR positive HER-2 negative, Oncotype DX recurrence score is 14 (9% risk of distant recurrence over 10 years with tamoxifen alone) status post radiation therapy, started antiestrogen therapy with anastrozole in January 2016 years switched to tamoxifen 09/23/2014  Tamoxifen toxicities: No major toxicities to tamoxifen.  Osteoporosis: DEXA scan November 2015 showed a T score of  -2.6 suggestive of osteoporosis. Hence we changed to treatment to tamoxifen. Patient had a lot of bone pain related to oral bisphosphonate therapy. I recommended Prolia but after much thought she decided not to take it. She will need another bone density November 2017.  Breast Cancer Surveillance: 1. Breast exam 01/30/15: Normal 2. Mammogram 12/10/14 No abnormalities. Postsurgical changes. Breast Density Category C. I recommended that she get 3-D mammograms for surveillance. Discussed the differences between different breast density categories.   RTC in 6 months  No orders of the defined types were placed in this encounter.   The patient has a good understanding of the overall plan. she agrees with it. she will call with any problems that may develop before the next visit here.   Rulon Eisenmenger, MD

## 2015-01-30 NOTE — Telephone Encounter (Signed)
Lt pt know [facility] was an error and she should disregard.  Someone at [facility] clicked wrong button when they were generating letters and [facility] apologizes.  Pt voiced understanding.

## 2015-01-30 NOTE — Telephone Encounter (Signed)
Gave avs & calendar for January °

## 2015-02-26 ENCOUNTER — Ambulatory Visit: Payer: Medicare Other | Admitting: Gastroenterology

## 2015-03-06 ENCOUNTER — Other Ambulatory Visit: Payer: Self-pay | Admitting: Internal Medicine

## 2015-03-06 ENCOUNTER — Telehealth: Payer: Self-pay | Admitting: Family Medicine

## 2015-03-06 NOTE — Telephone Encounter (Signed)
Pt needs medicare wellness exam.  Please schedule with the pt.  Thanks!

## 2015-03-06 NOTE — Telephone Encounter (Signed)
#  90 sent to the pharmacy.  Message sent to scheduling to help the pt with making a medicare wellness exam.

## 2015-03-09 NOTE — Telephone Encounter (Signed)
Pt has been sch

## 2015-03-09 NOTE — Telephone Encounter (Signed)
lmom for pt to call back

## 2015-03-26 ENCOUNTER — Encounter: Payer: Self-pay | Admitting: Gastroenterology

## 2015-03-26 ENCOUNTER — Ambulatory Visit (INDEPENDENT_AMBULATORY_CARE_PROVIDER_SITE_OTHER): Payer: Medicare Other | Admitting: Gastroenterology

## 2015-03-26 VITALS — BP 112/70 | HR 76 | Ht 67.0 in | Wt 192.5 lb

## 2015-03-26 DIAGNOSIS — K2270 Barrett's esophagus without dysplasia: Secondary | ICD-10-CM | POA: Diagnosis not present

## 2015-03-26 MED ORDER — RANITIDINE HCL 150 MG PO TABS: ORAL_TABLET | ORAL | Status: DC

## 2015-03-26 NOTE — Assessment & Plan Note (Signed)
We had a 15 minute discussion regarding Barrett's esophagus.  Negative biopsies on recent endoscopy may reflect a sampling error or possibly resolution of Barrett's.  I recommended that she continue Zantac but reduce it to 150 twice a day (patient does not want to take a PPI) and to repeat EGD in 3 years.

## 2015-03-26 NOTE — Patient Instructions (Signed)
Thank You for choosing [facility] to care for you.

## 2015-03-26 NOTE — Progress Notes (Signed)
      History of Present Illness:  Donna Manning has returned following upper endoscopy to discuss results.  Endoscopy demonstrated no specific abnormalities.  Biopsies were negative for Barrett's esophagus.  Mild inflammatory changes were seen.  She has no complaints.  She has taking 300 mg Zantac twice a day.    Review of Systems: Pertinent positive and negative review of systems were noted in the above HPI section. All other review of systems were otherwise negative.    Current Medications, Allergies, Past Medical History, Past Surgical History, Family History and Social History were reviewed in [facility] record  Vital signs were reviewed in today's medical record. Physical Exam: General: Well developed , well nourished, no acute distress  See Assessment and Plan under Problem List

## 2015-05-14 ENCOUNTER — Other Ambulatory Visit: Payer: Self-pay | Admitting: Internal Medicine

## 2015-06-22 ENCOUNTER — Other Ambulatory Visit: Payer: Self-pay

## 2015-06-22 ENCOUNTER — Other Ambulatory Visit: Payer: Self-pay | Admitting: Internal Medicine

## 2015-06-22 DIAGNOSIS — C50411 Malignant neoplasm of upper-outer quadrant of right female breast: Secondary | ICD-10-CM

## 2015-06-22 MED ORDER — TAMOXIFEN CITRATE 20 MG PO TABS: 20.0000 mg | ORAL_TABLET | Freq: Every day | ORAL | Status: DC

## 2015-06-22 NOTE — Telephone Encounter (Signed)
Pt called in - [employer] said they do not have an order for her tamoxifen.  Chart confirms she should have adequate refills to cover her until March 2016.  Pt traveling this week - requested interim fill at [facility].  Tamoxifen qty 30 refills 0 sent to [facility].    Writer confirmed with [facility] that patient has 1 refill left on her Rx.  Pharmacy tech does not know why [employer] told the patient she doesn't have any orders.  Advised we had to fill locally for 30 days and requested refill be sent to patient - requested qty adjusted to 60 if insurance will not pay for 90 days supply due to the 30 day local supply.

## 2015-06-24 ENCOUNTER — Ambulatory Visit (INDEPENDENT_AMBULATORY_CARE_PROVIDER_SITE_OTHER): Payer: Medicare Other | Admitting: Internal Medicine

## 2015-06-24 ENCOUNTER — Other Ambulatory Visit: Payer: Medicare Other

## 2015-06-24 ENCOUNTER — Encounter: Payer: Self-pay | Admitting: Internal Medicine

## 2015-06-24 VITALS — BP 124/82 | Temp 98.0°F | Ht 67.0 in | Wt 187.5 lb

## 2015-06-24 DIAGNOSIS — K219 Gastro-esophageal reflux disease without esophagitis: Secondary | ICD-10-CM

## 2015-06-24 DIAGNOSIS — E039 Hypothyroidism, unspecified: Secondary | ICD-10-CM

## 2015-06-24 DIAGNOSIS — E785 Hyperlipidemia, unspecified: Secondary | ICD-10-CM | POA: Diagnosis not present

## 2015-06-24 DIAGNOSIS — M2550 Pain in unspecified joint: Secondary | ICD-10-CM

## 2015-06-24 DIAGNOSIS — C50411 Malignant neoplasm of upper-outer quadrant of right female breast: Secondary | ICD-10-CM

## 2015-06-24 DIAGNOSIS — Z79899 Other long term (current) drug therapy: Secondary | ICD-10-CM | POA: Diagnosis not present

## 2015-06-24 DIAGNOSIS — R32 Unspecified urinary incontinence: Secondary | ICD-10-CM | POA: Diagnosis not present

## 2015-06-24 DIAGNOSIS — Z0000 Encounter for general adult medical examination without abnormal findings: Secondary | ICD-10-CM

## 2015-06-24 DIAGNOSIS — Z1159 Encounter for screening for other viral diseases: Secondary | ICD-10-CM | POA: Diagnosis not present

## 2015-06-24 LAB — CBC WITH DIFFERENTIAL/PLATELET
BASOS PCT: 1.1 % (ref 0.0–3.0)
Basophils Absolute: 0 10*3/uL (ref 0.0–0.1)
EOS ABS: 0.3 10*3/uL (ref 0.0–0.7)
Eosinophils Relative: 6 % — ABNORMAL HIGH (ref 0.0–5.0)
HCT: 37.4 % (ref 36.0–46.0)
Hemoglobin: 12.3 g/dL (ref 12.0–15.0)
Lymphocytes Relative: 25.8 % (ref 12.0–46.0)
Lymphs Abs: 1.1 10*3/uL (ref 0.7–4.0)
MCHC: 32.9 g/dL (ref 30.0–36.0)
MCV: 92.7 fl (ref 78.0–100.0)
MONO ABS: 0.4 10*3/uL (ref 0.1–1.0)
Monocytes Relative: 8.5 % (ref 3.0–12.0)
NEUTROS ABS: 2.6 10*3/uL (ref 1.4–7.7)
Neutrophils Relative %: 58.6 % (ref 43.0–77.0)
PLATELETS: 255 10*3/uL (ref 150.0–400.0)
RBC: 4.04 Mil/uL (ref 3.87–5.11)
RDW: 12.9 % (ref 11.5–15.5)
WBC: 4.4 10*3/uL (ref 4.0–10.5)

## 2015-06-24 LAB — POCT URINALYSIS DIPSTICK
BILIRUBIN UA: NEGATIVE
GLUCOSE UA: NEGATIVE
Ketones, UA: NEGATIVE
Leukocytes, UA: NEGATIVE
Nitrite, UA: NEGATIVE
Protein, UA: NEGATIVE
UROBILINOGEN UA: 0.2
pH, UA: 5

## 2015-06-24 LAB — BASIC METABOLIC PANEL
BUN: 10 mg/dL (ref 6–23)
CHLORIDE: 108 meq/L (ref 96–112)
CO2: 28 meq/L (ref 19–32)
CREATININE: 0.82 mg/dL (ref 0.40–1.20)
Calcium: 9.2 mg/dL (ref 8.4–10.5)
GFR: 74.19 mL/min (ref >60.00)
Glucose, Bld: 103 mg/dL — ABNORMAL HIGH (ref 70–99)
Potassium: 4.8 mEq/L (ref 3.5–5.1)
Sodium: 142 mEq/L (ref 135–145)

## 2015-06-24 LAB — LIPID PANEL
CHOL/HDL RATIO: 4
Cholesterol: 175 mg/dL (ref 0–200)
HDL: 42.1 mg/dL (ref >39.00)
LDL Cholesterol: 105 mg/dL — ABNORMAL HIGH (ref 0–99)
NonHDL: 133.12
TRIGLYCERIDES: 142 mg/dL (ref 0.0–149.0)
VLDL: 28.4 mg/dL (ref 0.0–40.0)

## 2015-06-24 LAB — HEPATIC FUNCTION PANEL
ALT: 19 U/L (ref 0–35)
AST: 19 U/L (ref 0–37)
Albumin: 4.2 g/dL (ref 3.5–5.2)
Alkaline Phosphatase: 34 U/L — ABNORMAL LOW (ref 39–117)
BILIRUBIN DIRECT: 0.1 mg/dL (ref 0.0–0.3)
BILIRUBIN TOTAL: 0.5 mg/dL (ref 0.2–1.2)
TOTAL PROTEIN: 6.6 g/dL (ref 6.0–8.3)

## 2015-06-24 LAB — TSH: TSH: 0.91 u[IU]/mL (ref 0.35–4.50)

## 2015-06-24 MED ORDER — VALACYCLOVIR HCL 1 G PO TABS: 2000.0000 mg | ORAL_TABLET | Freq: Two times a day (BID) | ORAL | Status: DC | PRN

## 2015-06-24 MED ORDER — LEVOTHYROXINE SODIUM 88 MCG PO TABS: ORAL_TABLET | ORAL | Status: DC

## 2015-06-24 NOTE — Progress Notes (Signed)
Pre visit review using our clinic review tool, if applicable. No additional management support is needed unless otherwise documented below in the visit note.  Chief Complaint  Patient presents with  . Medicare Wellness    refill  med   . Hypothyroidism    HPI: Donna Manning 64 y.o. comes in today for welcome tp  Preventive Medicare wellness visit . She has dc of hypothyroid , sp breast cancer rx on  Tamoxifen, osteoprosis  But had se of bisphosphonates no fx, now  Progressing left hip and right knee pain sometimes  interrups mobility ls. Gets cold sore  Out or zovirax  Says vatrex worked better in past   Gi no sx now has had UE  And recently   No sign   barretts . Dr Deatra Ina left the practice . Health Maintenance  Topic Date Due  . PAP SMEAR  09/30/1970  . INFLUENZA VACCINE  12/16/2015 (Originally 02/16/2015)  . ZOSTAVAX  06/23/2016 (Originally 09/29/2009)  . Hepatitis C Screening  06/23/2016 (Originally [birth year])  . HIV Screening  06/23/2016 (Originally 09/29[birth year]1964)  . PNA vac Low Risk Adult (1 of 2 - PCV13) 06/23/2016 (Originally 09/30/2014)  . MAMMOGRAM  12/09/2016  . COLONOSCOPY  11/16/2020  . TETANUS/TDAP  11/29/2021  . DEXA SCAN  Completed   Health Maintenance Review LIFESTYLE:  TAD :  Neg see form  Sugar beverages: one coke per day.  Sleep: interrupted on  tamoixifen    MEDICARE DOCUMENT QUESTIONS  TO SCAN   Hearing: okVision:  No limitations at present . Last eye check UTD  Safety:  Has smoke detector and wears seat belts.  No firearms. No excess sun exposure. Sees dentist regularly.  Falls: n  Advance directive :  Reviewed   Has hcpoa dauaghter but not written down  HO given   Memory: Felt to be good  , no concern from her or her family.  Depression: No anhedonia unusual crying or depressive symptoms  Nutrition: Eats well balanced diet; adequate calcium and vitamin D. No swallowing chewing problems.  Injury: no major injuries in the last six  months.  Other healthcare providers:  Reviewed today .  Social:  Lives with spouse married.  Pets    Preventive parameters: up-to-date  Reviewed   ADLS:   There are no problems or need for assistance  driving, feeding, obtaining food, dressing, toileting and bathing, managing money using phone. She is independent.    ROS:  See above  Has some UI recnetly no uti sx otherwise  GEN/ HEENT: No fever, significant weight changes sweats headaches vision problems hearing changes, CV/ PULM; No chest pain shortness of breath cough, syncope,edema  change in exercise tolerance. GI /GU: No adominal pain, vomiting, change in bowel habits. No blood in the stool. No significant GU symptoms. SKIN/HEME: ,no acute skin rashes suspicious lesions or bleeding. No lymphadenopathy, nodules, masses.  NEURO/ PSYCH:  No neurologic signs such as weakness numbness. No depression anxiety. IMM/ Allergy: No unusual infections.  Allergy .   REST of 12 system review negative except as per HPI   Past Medical History  Diagnosis Date  . GERD (gastroesophageal reflux disease)   . Barrett's esophagus 2009    on EGD  . Hypothyroidism   . History of DVT (deep vein thrombosis) > 30 years ago    after a pregnancy  . History of gastric ulcer     secondary to ASA use  . Dental crowns present   . Breast cancer ([place])  01/2014    right  . Radiation 03/31/14-04/25/14    Right Breast    Family History  Problem Relation Age of Onset  . Heart failure Mother 44  . Diabetes Mother   . Parkinsonism Father 7  . Stroke Father   . Thyroid disease Sister   . Stomach cancer Paternal Grandfather   . Prostate cancer Brother     Social History   Social History  . Marital Status: Married    Spouse Name: N/A  . Number of Children: 5  . Years of Education: N/A   Occupational History  . homemaker    Social History Main Topics  . Smoking status: Never Smoker   . Smokeless tobacco: Never Used  . Alcohol Use: No  . Drug  Use: No  . Sexual Activity: Not Asked   Other Topics Concern  . None   Social History Narrative   Married   Regular Exercise- had not been    hh of _0 cxats of child    [place] to [facility]    G4 P5    Outpatient Encounter Prescriptions as of 06/24/2015  Medication Sig  . levothyroxine (SYNTHROID, LEVOTHROID) 88 MCG tablet TAKE 1 TABLET ONE TIME DAILY  . Multiple Vitamins-Minerals (CENTRUM SILVER PO) Take 1 tablet by mouth daily.  . ranitidine (ZANTAC) 150 MG tablet Take 1 tablet twice daily.  . tamoxifen (NOLVADEX) 20 MG tablet Take 1 tablet (20 mg total) by mouth daily.  . [DISCONTINUED] acyclovir (ZOVIRAX) 400 MG tablet TAKE ONE TABLET BY MOUTH THREE TIMES DAILY  . [DISCONTINUED] levothyroxine (SYNTHROID, LEVOTHROID) 88 MCG tablet TAKE 1 TABLET ONE TIME DAILY  . valACYclovir (VALTREX) 1000 MG tablet Take 2 tablets (2,000 mg total) by mouth 2 (two) times daily as needed.   No facility-administered encounter medications on file as of 06/24/2015.    EXAM:  BP 124/82 mmHg  Temp(Src) 98 F (36.7 C) (Oral)  Ht _1  (1.702 m)  Wt 187 lb 8 oz (85.049 kg)  BMI 29.36 kg/m2  Body mass index is 29.36 kg/(m^2).  Physical Exam: Vital signs reviewed JJO:ACZY is a well-developed well-nourished alert cooperative   who appears stated age in no acute distress.  HEENT: normocephalic atraumatic , Eyes: PERRL EOM's full, conjunctiva clear, Nares: paten,t no deformity discharge or tenderness., Ears: no deformity EAC's clear TMs with normal landmarks. Mouth: clear OP, no lesions, edema.  Moist mucous membranes. Dentition in adequate repair. NECK: supple without masses, thyromegaly or bruits. CHEST/PULM:  Clear to auscultation and percussion breath sounds equal no wheeze , rales or rhonchi. No chest wall deformities or  breats some rightditortion no masses  CV: PMI is nondisplaced, S1 S2 no gallops, murmurs, rubs. Peripheral pulses are full without delay.No JVD .  ABDOMEN: Bowel sounds normal  nontender  No guard or rebound, no hepato splenomegal no CVA tenderness.   Extremtities:  No clubbing cyanosis or edema, no acute joint swelling or redness no focal atrophy feet look normal NEURO:  Oriented x3, cranial nerves 3-12 appear to be intact, no obvious focal weakness,gait within normal limits no abnormal reflexes or asymmetrical SKIN: No acute rashes normal turgor, color, no bruising or petechiae. PSYCH: Oriented, good eye contact, no obvious depression anxiety, cognition and judgment appear normal. LN: no cervical axillary inguinal adenopathy No noted deficits in memory, attention, and speech. BP Readings from Last 3 Encounters:  06/24/15 124/82  03/26/15 112/70  01/30/15 110/53   Wt Readings from Last 3  Encounters:  06/24/15 187 lb 8 oz (85.049 kg)  03/26/15 192 lb 8 oz (87.317 kg)  01/30/15 194 lb 4.8 oz (88.134 kg)     ASSESSMENT AND PLAN:  Discussed the following assessment and plan:  Welcome to Medicare preventive visit - Plan: Basic metabolic panel, CBC with Differential/Platelet, Hepatic function panel, TSH, POCT urinalysis dipstick  Hypothyroidism, unspecified hypothyroidism type - Plan: TSH  Breast cancer of upper-outer quadrant of right female breast ([place])  Need for hepatitis C screening test - low risk  - Plan: Hepatitis C antibody  HLD (hyperlipidemia) screen - screen for today  - Plan: Lipid panel  Medication management  Gastroesophageal reflux disease without esophagitis - ? barrets   following  per gi - Plan: CBC with Differential/Platelet  Urinary incontinence in female  Multiple joint pain - left hip right knee Labs  Patient Care Team: Burnis Medin, MD as PCP - General [name], MD as Attending Physician (Obstetrics and Gynecology) Coral Spikes, MD (Gastroenterology) Sharyne Peach, MD (Ophthalmology) Fanny Skates, MD as Consulting Physician (General Surgery) Thea Silversmith, MD as Consulting Physician (Radiation Oncology)  Patient  Instructions  Consider seeing ortho  For knee and hip Gyne for the urinary In  There are exercises PT /OT that may help .  Will notify you  of labs when available. M,ake appt injection for  Flu vaccine and prevnar 13 inje Check into shingles vaccine ( Zostavax) reimbursement or cost to you  and can return at any time if call ahead for injection. Same thryoid med for now. Try valtrex instead of acyclovir may work better and less pills   Health Maintenance, Female Adopting a healthy lifestyle and getting preventive care can go a long way to promote health and wellness. Talk with your health care provider about what schedule of regular examinations is right for you. This is a good chance for you to check in with your provider about disease prevention and staying healthy. In between checkups, there are plenty of things you can do on your own. Experts have done a lot of research about which lifestyle changes and preventive measures are most likely to keep you healthy. Ask your health care provider for more information. WEIGHT AND DIET  Eat a healthy diet  Be sure to include plenty of vegetables, fruits, low-fat dairy products, and lean protein.  Do not eat a lot of foods high in solid fats, added sugars, or salt.  Get regular exercise. This is one of the most important things you can do for your health.  Most adults should exercise for at least 150 minutes each week. The exercise should increase your heart rate and make you sweat (moderate-intensity exercise).  Most adults should also do strengthening exercises at least twice a week. This is in addition to the moderate-intensity exercise.  Maintain a healthy weight  Body mass index (BMI) is a measurement that can be used to identify possible weight problems. It estimates body fat based on height and weight. Your health care provider can help determine your BMI and help you achieve or maintain a healthy weight.  For females 72 years of age  and older:   A BMI below 18.5 is considered underweight.  A BMI of 18.5 to 24.9 is normal.  A BMI of 25 to 29.9 is considered overweight.  A BMI of 30 and above is considered obese.  Watch levels of cholesterol and blood lipids  You should start having your blood tested for lipids and cholesterol at  65 years of age, then have this test every 5 years.  You may need to have your cholesterol levels checked more often if:  Your lipid or cholesterol levels are high.  You are older than 65 years of age.  You are at high risk for heart disease.  CANCER SCREENING   Lung Cancer  Lung cancer screening is recommended for adults 72-25 years old who are at high risk for lung cancer because of a history of smoking.  A yearly low-dose CT scan of the lungs is recommended for people who:  Currently smoke.  Have quit within the past 15 years.  Have at least a 30-pack-year history of smoking. A pack year is smoking an average of one pack of cigarettes a day for 1 year.  Yearly screening should continue until it has been 15 years since you quit.  Yearly screening should stop if you develop a health problem that would prevent you from having lung cancer treatment.  Breast Cancer  Practice breast self-awareness. This means understanding how your breasts normally appear and feel.  It also means doing regular breast self-exams. Let your health care provider know about any changes, no matter how small.  If you are in your 20s or 30s, you should have a clinical breast exam (CBE) by a health care provider every 1-3 years as part of a regular health exam.  If you are 69 or older, have a CBE every year. Also consider having a breast X-ray (mammogram) every year.  If you have a family history of breast cancer, talk to your health care provider about genetic screening.  If you are at high risk for breast cancer, talk to your health care provider about having an MRI and a mammogram every  year.  Breast cancer gene (BRCA) assessment is recommended for women who have family members with BRCA-related cancers. BRCA-related cancers include:  Breast.  Ovarian.  Tubal.  Peritoneal cancers.  Results of the assessment will determine the need for genetic counseling and BRCA1 and BRCA2 testing. Cervical Cancer Your health care provider may recommend that you be screened regularly for cancer of the pelvic organs (ovaries, uterus, and vagina). This screening involves a pelvic examination, including checking for microscopic changes to the surface of your cervix (Pap test). You may be encouraged to have this screening done every 3 years, beginning at age 46.  For women ages 27-65, health care providers may recommend pelvic exams and Pap testing every 3 years, or they may recommend the Pap and pelvic exam, combined with testing for human papilloma virus (HPV), every 5 years. Some types of HPV increase your risk of cervical cancer. Testing for HPV may also be done on women of any age with unclear Pap test results.  Other health care providers may not recommend any screening for nonpregnant women who are considered low risk for pelvic cancer and who do not have symptoms. Ask your health care provider if a screening pelvic exam is right for you.  If you have had past treatment for cervical cancer or a condition that could lead to cancer, you need Pap tests and screening for cancer for at least 20 years after your treatment. If Pap tests have been discontinued, your risk factors (such as having a new sexual partner) need to be reassessed to determine if screening should resume. Some women have medical problems that increase the chance of getting cervical cancer. In these cases, your health care provider may recommend more frequent screening and Pap  tests. Colorectal Cancer  This type of cancer can be detected and often prevented.  Routine colorectal cancer screening usually begins at 65 years of  age and continues through 65 years of age.  Your health care provider may recommend screening at an earlier age if you have risk factors for colon cancer.  Your health care provider may also recommend using home test kits to check for hidden blood in the stool.  A small camera at the end of a tube can be used to examine your colon directly (sigmoidoscopy or colonoscopy). This is done to check for the earliest forms of colorectal cancer.  Routine screening usually begins at age 75.  Direct examination of the colon should be repeated every 5-10 years through 65 years of age. However, you may need to be screened more often if early forms of precancerous polyps or small growths are found. Skin Cancer  Check your skin from head to toe regularly.  Tell your health care provider about any new moles or changes in moles, especially if there is a change in a mole's shape or color.  Also tell your health care provider if you have a mole that is larger than the size of a pencil eraser.  Always use sunscreen. Apply sunscreen liberally and repeatedly throughout the day.  Protect yourself by wearing long sleeves, pants, a wide-brimmed hat, and sunglasses whenever you are outside. HEART DISEASE, DIABETES, AND HIGH BLOOD PRESSURE   High blood pressure causes heart disease and increases the risk of stroke. High blood pressure is more likely to develop in:  People who have blood pressure in the high end of the normal range (130-139/85-89 mm Hg).  People who are overweight or obese.  People who are African American.  If you are 42-53 years of age, have your blood pressure checked every 3-5 years. If you are 11 years of age or older, have your blood pressure checked every year. You should have your blood pressure measured twice--once when you are at a hospital or clinic, and once when you are not at a hospital or clinic. Record the average of the two measurements. To check your blood pressure when you are  not at a hospital or clinic, you can use:  An automated blood pressure machine at a pharmacy.  A home blood pressure monitor.  If you are between 23 years and 55 years old, ask your health care provider if you should take aspirin to prevent strokes.  Have regular diabetes screenings. This involves taking a blood sample to check your fasting blood sugar level.  If you are at a normal weight and have a low risk for diabetes, have this test once every three years after 65 years of age.  If you are overweight and have a high risk for diabetes, consider being tested at a younger age or more often. PREVENTING INFECTION  Hepatitis B  If you have a higher risk for hepatitis B, you should be screened for this virus. You are considered at high risk for hepatitis B if:  You were born in a country where hepatitis B is common. Ask your health care provider which countries are considered high risk.  Your parents were born in a high-risk country, and you have not been immunized against hepatitis B (hepatitis B vaccine).  You have HIV or AIDS.  You use needles to inject street drugs.  You live with someone who has hepatitis B.  You have had sex with someone who has hepatitis B.  You get hemodialysis treatment.  You take certain medicines for conditions, including cancer, organ transplantation, and autoimmune conditions. Hepatitis C  Blood testing is recommended for:  Everyone born from [birth year] through 1966.  Anyone with known risk factors for hepatitis C. Sexually transmitted infections (STIs)  You should be screened for sexually transmitted infections (STIs) including gonorrhea and chlamydia if:  You are sexually active and are younger than 65 years of age.  You are older than 65 years of age and your health care provider tells you that you are at risk for this type of infection.  Your sexual activity has changed since you were last screened and you are at an increased risk for  chlamydia or gonorrhea. Ask your health care provider if you are at risk.  If you do not have HIV, but are at risk, it may be recommended that you take a prescription medicine daily to prevent HIV infection. This is called pre-exposure prophylaxis (PrEP). You are considered at risk if:  You are sexually active and do not regularly use condoms or know the HIV status of your partner(s).  You take drugs by injection.  You are sexually active with a partner who has HIV. Talk with your health care provider about whether you are at high risk of being infected with HIV. If you choose to begin PrEP, you should first be tested for HIV. You should then be tested every 3 months for as long as you are taking PrEP.  PREGNANCY   If you are premenopausal and you may become pregnant, ask your health care provider about preconception counseling.  If you may become pregnant, take 400 to 800 micrograms (mcg) of folic acid every day.  If you want to prevent pregnancy, talk to your health care provider about birth control (contraception). OSTEOPOROSIS AND MENOPAUSE   Osteoporosis is a disease in which the bones lose minerals and strength with aging. This can result in serious bone fractures. Your risk for osteoporosis can be identified using a bone density scan.  If you are 58 years of age or older, or if you are at risk for osteoporosis and fractures, ask your health care provider if you should be screened.  Ask your health care provider whether you should take a calcium or vitamin D supplement to lower your risk for osteoporosis.  Menopause may have certain physical symptoms and risks.  Hormone replacement therapy may reduce some of these symptoms and risks. Talk to your health care provider about whether hormone replacement therapy is right for you.  HOME CARE INSTRUCTIONS   Schedule regular health, dental, and eye exams.  Stay current with your immunizations.   Do not use any tobacco products  including cigarettes, chewing tobacco, or electronic cigarettes.  If you are pregnant, do not drink alcohol.  If you are breastfeeding, limit how much and how often you drink alcohol.  Limit alcohol intake to no more than 1 drink per day for nonpregnant women. One drink equals 12 ounces of beer, 5 ounces of wine, or 1 ounces of hard liquor.  Do not use street drugs.  Do not share needles.  Ask your health care provider for help if you need support or information about quitting drugs.  Tell your health care provider if you often feel depressed.  Tell your health care provider if you have ever been abused or do not feel safe at home.   This information is not intended to replace advice given to you by your health care  provider. Make sure you discuss any questions you have with your health care provider.   Document Released: 01/17/2011 Document Revised: 07/25/2014 Document Reviewed: 06/05/2013 Elsevier Interactive Patient Education 2016 Cameron K. Kenyanna Grzesiak M.D.

## 2015-06-24 NOTE — Patient Instructions (Addendum)
Consider seeing ortho  For knee and hip Gyne for the urinary In  There are exercises PT /OT that may help .  Will notify you  of labs when available. M,ake appt injection for  Flu vaccine and prevnar 13 inje Check into shingles vaccine ( Zostavax) reimbursement or cost to you  and can return at any time if call ahead for injection. Same thryoid med for now. Try valtrex instead of acyclovir may work better and less pills   Health Maintenance, Female Adopting a healthy lifestyle and getting preventive care can go a long way to promote health and wellness. Talk with your health care provider about what schedule of regular examinations is right for you. This is a good chance for you to check in with your provider about disease prevention and staying healthy. In between checkups, there are plenty of things you can do on your own. Experts have done a lot of research about which lifestyle changes and preventive measures are most likely to keep you healthy. Ask your health care provider for more information. WEIGHT AND DIET  Eat a healthy diet  Be sure to include plenty of vegetables, fruits, low-fat dairy products, and lean protein.  Do not eat a lot of foods high in solid fats, added sugars, or salt.  Get regular exercise. This is one of the most important things you can do for your health.  Most adults should exercise for at least 150 minutes each week. The exercise should increase your heart rate and make you sweat (moderate-intensity exercise).  Most adults should also do strengthening exercises at least twice a week. This is in addition to the moderate-intensity exercise.  Maintain a healthy weight  Body mass index (BMI) is a measurement that can be used to identify possible weight problems. It estimates body fat based on height and weight. Your health care provider can help determine your BMI and help you achieve or maintain a healthy weight.  For females 10 years of age and older:    A BMI below 18.5 is considered underweight.  A BMI of 18.5 to 24.9 is normal.  A BMI of 25 to 29.9 is considered overweight.  A BMI of 30 and above is considered obese.  Watch levels of cholesterol and blood lipids  You should start having your blood tested for lipids and cholesterol at 65 years of age, then have this test every 5 years.  You may need to have your cholesterol levels checked more often if:  Your lipid or cholesterol levels are high.  You are older than 65 years of age.  You are at high risk for heart disease.  CANCER SCREENING   Lung Cancer  Lung cancer screening is recommended for adults 27-83 years old who are at high risk for lung cancer because of a history of smoking.  A yearly low-dose CT scan of the lungs is recommended for people who:  Currently smoke.  Have quit within the past 15 years.  Have at least a 30-pack-year history of smoking. A pack year is smoking an average of one pack of cigarettes a day for 1 year.  Yearly screening should continue until it has been 15 years since you quit.  Yearly screening should stop if you develop a health problem that would prevent you from having lung cancer treatment.  Breast Cancer  Practice breast self-awareness. This means understanding how your breasts normally appear and feel.  It also means doing regular breast self-exams. Let your health care  provider know about any changes, no matter how small.  If you are in your 20s or 30s, you should have a clinical breast exam (CBE) by a health care provider every 1-3 years as part of a regular health exam.  If you are 39 or older, have a CBE every year. Also consider having a breast X-ray (mammogram) every year.  If you have a family history of breast cancer, talk to your health care provider about genetic screening.  If you are at high risk for breast cancer, talk to your health care provider about having an MRI and a mammogram every year.  Breast  cancer gene (BRCA) assessment is recommended for women who have family members with BRCA-related cancers. BRCA-related cancers include:  Breast.  Ovarian.  Tubal.  Peritoneal cancers.  Results of the assessment will determine the need for genetic counseling and BRCA1 and BRCA2 testing. Cervical Cancer Your health care provider may recommend that you be screened regularly for cancer of the pelvic organs (ovaries, uterus, and vagina). This screening involves a pelvic examination, including checking for microscopic changes to the surface of your cervix (Pap test). You may be encouraged to have this screening done every 3 years, beginning at age 43.  For women ages 22-65, health care providers may recommend pelvic exams and Pap testing every 3 years, or they may recommend the Pap and pelvic exam, combined with testing for human papilloma virus (HPV), every 5 years. Some types of HPV increase your risk of cervical cancer. Testing for HPV may also be done on women of any age with unclear Pap test results.  Other health care providers may not recommend any screening for nonpregnant women who are considered low risk for pelvic cancer and who do not have symptoms. Ask your health care provider if a screening pelvic exam is right for you.  If you have had past treatment for cervical cancer or a condition that could lead to cancer, you need Pap tests and screening for cancer for at least 20 years after your treatment. If Pap tests have been discontinued, your risk factors (such as having a new sexual partner) need to be reassessed to determine if screening should resume. Some women have medical problems that increase the chance of getting cervical cancer. In these cases, your health care provider may recommend more frequent screening and Pap tests. Colorectal Cancer  This type of cancer can be detected and often prevented.  Routine colorectal cancer screening usually begins at 65 years of age and  continues through 65 years of age.  Your health care provider may recommend screening at an earlier age if you have risk factors for colon cancer.  Your health care provider may also recommend using home test kits to check for hidden blood in the stool.  A small camera at the end of a tube can be used to examine your colon directly (sigmoidoscopy or colonoscopy). This is done to check for the earliest forms of colorectal cancer.  Routine screening usually begins at age 4.  Direct examination of the colon should be repeated every 5-10 years through 65 years of age. However, you may need to be screened more often if early forms of precancerous polyps or small growths are found. Skin Cancer  Check your skin from head to toe regularly.  Tell your health care provider about any new moles or changes in moles, especially if there is a change in a mole's shape or color.  Also tell your health care provider  if you have a mole that is larger than the size of a pencil eraser.  Always use sunscreen. Apply sunscreen liberally and repeatedly throughout the day.  Protect yourself by wearing long sleeves, pants, a wide-brimmed hat, and sunglasses whenever you are outside. HEART DISEASE, DIABETES, AND HIGH BLOOD PRESSURE   High blood pressure causes heart disease and increases the risk of stroke. High blood pressure is more likely to develop in:  People who have blood pressure in the high end of the normal range (130-139/85-89 mm Hg).  People who are overweight or obese.  People who are African American.  If you are 17-36 years of age, have your blood pressure checked every 3-5 years. If you are 67 years of age or older, have your blood pressure checked every year. You should have your blood pressure measured twice--once when you are at a hospital or clinic, and once when you are not at a hospital or clinic. Record the average of the two measurements. To check your blood pressure when you are not at  a hospital or clinic, you can use:  An automated blood pressure machine at a pharmacy.  A home blood pressure monitor.  If you are between 50 years and 20 years old, ask your health care provider if you should take aspirin to prevent strokes.  Have regular diabetes screenings. This involves taking a blood sample to check your fasting blood sugar level.  If you are at a normal weight and have a low risk for diabetes, have this test once every three years after 65 years of age.  If you are overweight and have a high risk for diabetes, consider being tested at a younger age or more often. PREVENTING INFECTION  Hepatitis B  If you have a higher risk for hepatitis B, you should be screened for this virus. You are considered at high risk for hepatitis B if:  You were born in a country where hepatitis B is common. Ask your health care provider which countries are considered high risk.  Your parents were born in a high-risk country, and you have not been immunized against hepatitis B (hepatitis B vaccine).  You have HIV or AIDS.  You use needles to inject street drugs.  You live with someone who has hepatitis B.  You have had sex with someone who has hepatitis B.  You get hemodialysis treatment.  You take certain medicines for conditions, including cancer, organ transplantation, and autoimmune conditions. Hepatitis C  Blood testing is recommended for:  Everyone born from [birth year] through 1966.  Anyone with known risk factors for hepatitis C. Sexually transmitted infections (STIs)  You should be screened for sexually transmitted infections (STIs) including gonorrhea and chlamydia if:  You are sexually active and are younger than 65 years of age.  You are older than 65 years of age and your health care provider tells you that you are at risk for this type of infection.  Your sexual activity has changed since you were last screened and you are at an increased risk for chlamydia or  gonorrhea. Ask your health care provider if you are at risk.  If you do not have HIV, but are at risk, it may be recommended that you take a prescription medicine daily to prevent HIV infection. This is called pre-exposure prophylaxis (PrEP). You are considered at risk if:  You are sexually active and do not regularly use condoms or know the HIV status of your partner(s).  You take drugs by  injection.  You are sexually active with a partner who has HIV. Talk with your health care provider about whether you are at high risk of being infected with HIV. If you choose to begin PrEP, you should first be tested for HIV. You should then be tested every 3 months for as long as you are taking PrEP.  PREGNANCY   If you are premenopausal and you may become pregnant, ask your health care provider about preconception counseling.  If you may become pregnant, take 400 to 800 micrograms (mcg) of folic acid every day.  If you want to prevent pregnancy, talk to your health care provider about birth control (contraception). OSTEOPOROSIS AND MENOPAUSE   Osteoporosis is a disease in which the bones lose minerals and strength with aging. This can result in serious bone fractures. Your risk for osteoporosis can be identified using a bone density scan.  If you are 80 years of age or older, or if you are at risk for osteoporosis and fractures, ask your health care provider if you should be screened.  Ask your health care provider whether you should take a calcium or vitamin D supplement to lower your risk for osteoporosis.  Menopause may have certain physical symptoms and risks.  Hormone replacement therapy may reduce some of these symptoms and risks. Talk to your health care provider about whether hormone replacement therapy is right for you.  HOME CARE INSTRUCTIONS   Schedule regular health, dental, and eye exams.  Stay current with your immunizations.   Do not use any tobacco products including  cigarettes, chewing tobacco, or electronic cigarettes.  If you are pregnant, do not drink alcohol.  If you are breastfeeding, limit how much and how often you drink alcohol.  Limit alcohol intake to no more than 1 drink per day for nonpregnant women. One drink equals 12 ounces of beer, 5 ounces of wine, or 1 ounces of hard liquor.  Do not use street drugs.  Do not share needles.  Ask your health care provider for help if you need support or information about quitting drugs.  Tell your health care provider if you often feel depressed.  Tell your health care provider if you have ever been abused or do not feel safe at home.   This information is not intended to replace advice given to you by your health care provider. Make sure you discuss any questions you have with your health care provider.   Document Released: 01/17/2011 Document Revised: 07/25/2014 Document Reviewed: 06/05/2013 Elsevier Interactive Patient Education [employer].

## 2015-06-25 LAB — HEPATITIS C ANTIBODY: HCV AB: NEGATIVE

## 2015-07-01 ENCOUNTER — Telehealth: Payer: Self-pay | Admitting: Internal Medicine

## 2015-07-01 NOTE — Telephone Encounter (Signed)
Pt called back from the Dec 9 phone call that you made to her and spoke with her husband. She said you can send her a copy of those labs.

## 2015-07-01 NOTE — Telephone Encounter (Signed)
Sent to the pt by mail.

## 2015-07-17 DIAGNOSIS — Z23 Encounter for immunization: Secondary | ICD-10-CM | POA: Diagnosis not present

## 2015-07-30 NOTE — Assessment & Plan Note (Signed)
Right breast invasive ductal carcinoma T2, N0, M0 stage IIA 0.9 cm and a separate nodule 0.25 cm ER/PR positive HER-2 negative, Oncotype DX recurrence score is 14 (9% risk of distant recurrence over 10 years with tamoxifen alone) status post radiation therapy, started antiestrogen therapy with anastrozole in January 2016 years switched to tamoxifen 09/23/2014  Tamoxifen toxicities: No major toxicities to tamoxifen.  Osteoporosis: DEXA scan November 2015 showed a T score of -2.6 suggestive of osteoporosis. Hence we changed to treatment to tamoxifen. Patient had a lot of bone pain related to oral bisphosphonate therapy. I recommended Prolia but after much thought she decided not to take it. She will need another bone density November 2017.  Breast Cancer Surveillance: 1. Breast exam1/13/17: Normal 2. Mammogram 12/10/14 No abnormalities. Postsurgical changes. Breast Density Category C. I recommended that she get 3-D mammograms for surveillance. Discussed the differences between different breast density categories.  RTC in 6 months

## 2015-07-31 ENCOUNTER — Ambulatory Visit: Payer: Medicare Other | Admitting: Hematology and Oncology

## 2015-08-19 DIAGNOSIS — N644 Mastodynia: Secondary | ICD-10-CM | POA: Diagnosis not present

## 2015-08-19 DIAGNOSIS — N3941 Urge incontinence: Secondary | ICD-10-CM | POA: Diagnosis not present

## 2015-08-19 DIAGNOSIS — R140 Abdominal distension (gaseous): Secondary | ICD-10-CM | POA: Diagnosis not present

## 2015-08-19 DIAGNOSIS — Z01419 Encounter for gynecological examination (general) (routine) without abnormal findings: Secondary | ICD-10-CM | POA: Diagnosis not present

## 2015-08-19 DIAGNOSIS — N898 Other specified noninflammatory disorders of vagina: Secondary | ICD-10-CM | POA: Diagnosis not present

## 2015-08-19 DIAGNOSIS — Z779 Other contact with and (suspected) exposures hazardous to health: Secondary | ICD-10-CM | POA: Diagnosis not present

## 2015-09-02 ENCOUNTER — Other Ambulatory Visit: Payer: Self-pay | Admitting: Hematology and Oncology

## 2015-09-02 DIAGNOSIS — R938 Abnormal findings on diagnostic imaging of other specified body structures: Secondary | ICD-10-CM | POA: Diagnosis not present

## 2015-09-02 DIAGNOSIS — R140 Abdominal distension (gaseous): Secondary | ICD-10-CM | POA: Diagnosis not present

## 2015-09-04 NOTE — Telephone Encounter (Signed)
Chart reviewed.

## 2015-09-07 NOTE — Assessment & Plan Note (Signed)
Right breast invasive ductal carcinoma T2, N0, M0 stage IIA 0.9 cm and a separate nodule 0.25 cm ER/PR positive HER-2 negative, Oncotype DX recurrence score is 14 (9% risk of distant recurrence over 10 years with tamoxifen alone) status post radiation therapy, started antiestrogen therapy with anastrozole in January 2016 years switched to tamoxifen 09/23/2014  Tamoxifen toxicities: No major toxicities to tamoxifen.  Osteoporosis: DEXA scan November 2015 showed a T score of -2.6 suggestive of osteoporosis. Hence we changed to treatment to tamoxifen. Patient had a lot of bone pain related to oral bisphosphonate therapy. I recommended Prolia but after much thought she decided not to take it. She will need another bone density November 2017.  Breast Cancer Surveillance: 1. Breast exam 09/08/15: Normal 2. Mammogram 12/10/14 No abnormalities. Postsurgical changes. Breast Density Category C. I recommended that she get 3-D mammograms for surveillance. Discussed the differences between different breast density categories.  RTC in 6 months

## 2015-09-08 ENCOUNTER — Ambulatory Visit (HOSPITAL_BASED_OUTPATIENT_CLINIC_OR_DEPARTMENT_OTHER): Payer: Medicare Other | Admitting: Hematology and Oncology

## 2015-09-08 ENCOUNTER — Encounter: Payer: Self-pay | Admitting: Hematology and Oncology

## 2015-09-08 VITALS — BP 103/61 | HR 72 | Temp 97.8°F | Resp 18 | Wt 187.2 lb

## 2015-09-08 DIAGNOSIS — M810 Age-related osteoporosis without current pathological fracture: Secondary | ICD-10-CM | POA: Diagnosis not present

## 2015-09-08 DIAGNOSIS — Z170 Estrogen receptor positive status [ER+]: Secondary | ICD-10-CM | POA: Diagnosis not present

## 2015-09-08 DIAGNOSIS — R938 Abnormal findings on diagnostic imaging of other specified body structures: Secondary | ICD-10-CM | POA: Diagnosis not present

## 2015-09-08 DIAGNOSIS — C50411 Malignant neoplasm of upper-outer quadrant of right female breast: Secondary | ICD-10-CM | POA: Diagnosis not present

## 2015-09-08 MED ORDER — TAMOXIFEN CITRATE 20 MG PO TABS: 20.0000 mg | ORAL_TABLET | Freq: Every day | ORAL | Status: DC

## 2015-09-08 NOTE — Progress Notes (Signed)
Pt reports ultrasound done last week by [name], [facility] for thickening uterine lining.  Records requested.  MD notified.

## 2015-09-08 NOTE — Progress Notes (Signed)
Patient Care Team: Burnis Medin, MD as PCP - General [name], MD as Attending Physician (Obstetrics and Gynecology) Sharyne Peach, MD (Ophthalmology) Fanny Skates, MD as Consulting Physician (General Surgery) Thea Silversmith, MD as Consulting Physician (Radiation Oncology)  DIAGNOSIS: Breast cancer of upper-outer quadrant of right female breast [facility])   Staging form: Breast, AJCC 7th Edition     Clinical: Stage 0 (Tis (DCIS), N0, cM0) - Unsigned       Staging comments: Staged at breast conference 01/01/14.      Pathologic: Stage IIA (T2(2), N0, cM0) - Unsigned   SUMMARY OF ONCOLOGIC HISTORY:   Breast cancer of upper-outer quadrant of right female breast ([place])   12/24/2013 Initial Diagnosis Breast cancer of upper-outer quadrant of right female breast: Initial biopsy showed DCIS with calcifications and atypical ductal hyperplasia with suspicion of stromal invasion ER 100% PR 100%   01/20/2014 Surgery Right breast lumpectomy, IDC grade 1; 3.9 cm and 0.25 cm with DCIS int grade with ALH 3 SLN negative superior margin positive, ER 100% PR 100% HER-2 negative ratio 1.1 Ki-67 3% Oncotype 14 low risk    02/05/2014 Surgery Reexcision of the superior margin no residual cancer   04/17/2014 - 05/16/2014 Radiation Therapy Adjuvant radiation therapy   07/25/2014 -  Anti-estrogen oral therapy Anastrozole 1 mg daily switched to tamoxifen 20 mg daily from 09/23/2014 due to osteoporosis    CHIEF COMPLIANT: Follow-up on tamoxifen  INTERVAL HISTORY: Donna Manning is a 65 year old with above-mentioned history of right breast cancer currently on adjuvant therapy with tamoxifen. We have to switch her from anastrozole to tamoxifen because of osteoporosis. She had been tolerating tamoxifen extremely well. She denies any hot flashes. She was recently found to have endometrial thickening by ultrasound by her gynecologist. The plan is to watch and monitor this with a 3 month follow-up ultrasound. She does  not have any vaginal bleeding.  REVIEW OF SYSTEMS:   Constitutional: Denies fevers, chills or abnormal weight loss Eyes: Denies blurriness of vision Ears, nose, mouth, throat, and face: Denies mucositis or sore throat Respiratory: Denies cough, dyspnea or wheezes Cardiovascular: Denies palpitation, chest discomfort Gastrointestinal:  Denies nausea, heartburn or change in bowel habits Skin: Denies abnormal skin rashes Lymphatics: Denies new lymphadenopathy or easy bruising Neurological:Denies numbness, tingling or new weaknesses Behavioral/Psych: Mood is stable, no new changes  Extremities: No lower extremity edema Breast:  denies any pain or lumps or nodules in either breasts All other systems were reviewed with the patient and are negative.  I have reviewed the past medical history, past surgical history, social history and family history with the patient and they are unchanged from previous note.  ALLERGIES:  is allergic to actonel; contrast media; and starch.  MEDICATIONS:  Current Outpatient Prescriptions  Medication Sig Dispense Refill  . acyclovir (ZOVIRAX) 400 MG tablet     . levothyroxine (SYNTHROID, LEVOTHROID) 88 MCG tablet TAKE 1 TABLET ONE TIME DAILY 90 tablet 3  . Multiple Vitamins-Minerals (CENTRUM SILVER PO) Take 1 tablet by mouth daily.    . ranitidine (ZANTAC) 150 MG tablet Take 1 tablet twice daily. 180 tablet 4  . tamoxifen (NOLVADEX) 20 MG tablet TAKE 1 TABLET EVERY DAY 30 tablet 0  . valACYclovir (VALTREX) 1000 MG tablet Take 2 tablets (2,000 mg total) by mouth 2 (two) times daily as needed. (Patient not taking: Reported on 09/08/2015) 30 tablet 6   No current facility-administered medications for this visit.    PHYSICAL EXAMINATION: ECOG PERFORMANCE STATUS: 1 -  Symptomatic but completely ambulatory  Filed Vitals:   09/08/15 0921  BP: 103/61  Pulse: 72  Temp: 97.8 F (36.6 C)  Resp: 18   Filed Weights   09/08/15 0921  Weight: 187 lb 3.2 oz (84.913  kg)    GENERAL:alert, no distress and comfortable SKIN: skin color, texture, turgor are normal, no rashes or significant lesions EYES: normal, Conjunctiva are pink and non-injected, sclera clear OROPHARYNX:no exudate, no erythema and lips, buccal mucosa, and tongue normal  NECK: supple, thyroid normal size, non-tender, without nodularity LYMPH:  no palpable lymphadenopathy in the cervical, axillary or inguinal LUNGS: clear to auscultation and percussion with normal breathing effort HEART: regular rate & rhythm and no murmurs and no lower extremity edema ABDOMEN:abdomen soft, non-tender and normal bowel sounds MUSCULOSKELETAL:no cyanosis of digits and no clubbing  NEURO: alert & oriented x 3 with fluent speech, no focal motor/sensory deficits EXTREMITIES: No lower extremity edema  LABORATORY DATA:  I have reviewed the data as listed   Chemistry      Component Value Date/Time   NA 142 06/24/2015 1101   NA 143 01/01/2014 0838   K 4.8 06/24/2015 1101   K 3.9 01/01/2014 0838   CL 108 06/24/2015 1101   CO2 28 06/24/2015 1101   CO2 26 01/01/2014 0838   BUN 10 06/24/2015 1101   BUN 12.9 01/01/2014 0838   CREATININE 0.82 06/24/2015 1101   CREATININE 0.9 01/01/2014 0838      Component Value Date/Time   CALCIUM 9.2 06/24/2015 1101   CALCIUM 9.2 01/01/2014 0838   CALCIUM 9.8 08/18/2006 2105   ALKPHOS 34* 06/24/2015 1101   ALKPHOS 51 01/01/2014 0838   AST 19 06/24/2015 1101   AST 19 01/01/2014 0838   ALT 19 06/24/2015 1101   ALT 22 01/01/2014 0838   BILITOT 0.5 06/24/2015 1101   BILITOT 0.39 01/01/2014 0838       Lab Results  Component Value Date   WBC 4.4 06/24/2015   HGB 12.3 06/24/2015   HCT 37.4 06/24/2015   MCV 92.7 06/24/2015   PLT 255.0 06/24/2015   NEUTROABS 2.6 06/24/2015   ASSESSMENT & PLAN:  Breast cancer of upper-outer quadrant of right female breast Right breast invasive ductal carcinoma T2, N0, M0 stage IIA 0.9 cm and a separate nodule 0.25 cm ER/PR  positive HER-2 negative, Oncotype DX recurrence score is 14 (9% risk of distant recurrence over 10 years with tamoxifen alone) status post radiation therapy, started antiestrogen therapy with anastrozole in January 2016 years switched to tamoxifen 09/23/2014 (due to osteoporosis)  Tamoxifen toxicities: Endometrial thickening: Patient is following with her gynecologist. The plan is to watch her for 3 months and repeat vaginal ultrasound. She does not have any symptoms of bleeding. She does have a white discharge.  Osteoporosis: DEXA scan November 2015 showed a T score of -2.6 suggestive of osteoporosis. Hence we changed to treatment to tamoxifen. Patient had a lot of bone pain related to oral bisphosphonate therapy. I recommended Prolia but after much thought she decided not to take it. She will need another bone density November 2017.  Breast Cancer Surveillance: 1. Breast exam 09/08/15: Normal 2. Mammogram 12/10/14 No abnormalities. Postsurgical changes. Breast Density Category C. I recommended that she get 3-D mammograms for surveillance. Discussed the differences between different breast density categories.  RTC in 1 year  No orders of the defined types were placed in this encounter.   The patient has a good understanding of the overall plan. she agrees with  it. she will call with any problems that may develop before the next visit here.   Rulon Eisenmenger, MD 09/08/2015

## 2015-09-09 ENCOUNTER — Telehealth: Payer: Self-pay | Admitting: Hematology and Oncology

## 2015-09-09 NOTE — Telephone Encounter (Signed)
Spoke with pt to confirm one year follow up appt for 09/08/16 at [facility]

## 2015-11-30 DIAGNOSIS — Z79810 Long term (current) use of selective estrogen receptor modulators (SERMs): Secondary | ICD-10-CM | POA: Diagnosis not present

## 2015-11-30 DIAGNOSIS — R938 Abnormal findings on diagnostic imaging of other specified body structures: Secondary | ICD-10-CM | POA: Diagnosis not present

## 2015-12-31 DIAGNOSIS — R938 Abnormal findings on diagnostic imaging of other specified body structures: Secondary | ICD-10-CM | POA: Diagnosis not present

## 2016-01-05 ENCOUNTER — Ambulatory Visit (INDEPENDENT_AMBULATORY_CARE_PROVIDER_SITE_OTHER): Payer: Medicare Other | Admitting: Internal Medicine

## 2016-01-05 ENCOUNTER — Encounter: Payer: Self-pay | Admitting: Internal Medicine

## 2016-01-05 ENCOUNTER — Telehealth: Payer: Self-pay | Admitting: Internal Medicine

## 2016-01-05 VITALS — BP 104/72 | Temp 97.6°F | Ht 67.0 in | Wt 184.5 lb

## 2016-01-05 DIAGNOSIS — C50411 Malignant neoplasm of upper-outer quadrant of right female breast: Secondary | ICD-10-CM | POA: Diagnosis not present

## 2016-01-05 DIAGNOSIS — H578 Other specified disorders of eye and adnexa: Secondary | ICD-10-CM

## 2016-01-05 DIAGNOSIS — R6889 Other general symptoms and signs: Secondary | ICD-10-CM

## 2016-01-05 MED ORDER — AZELASTINE HCL 0.05 % OP SOLN: 1.0000 [drp] | Freq: Two times a day (BID) | OPHTHALMIC | Status: DC

## 2016-01-05 NOTE — Telephone Encounter (Signed)
Patient Name: Donna Manning  DOB: 1951-04-08    Initial Comment Caller states having allergies, worst ever; itchy eyes; OTC drops not helping;    Nurse Assessment  Nurse: Mallie Mussel, RN, Alveta Heimlich Date/Time (Eastern Time): 01/05/2016 11:19:53 AM  Confirm and document reason for call. If symptomatic, describe symptoms. You must click the next button to save text entered. ---Caller states that she has had problems with her allergies for the past couple weeks. She has been using OTC drops for her itchy eyes but it doesn't help. this is her worst outbreak yet. She is not on an antihistamine. She is wanting to be seen.  Has the patient traveled out of the country within the last 30 days? ---No  Does the patient have any new or worsening symptoms? ---Yes  Will a triage be completed? ---Yes  Related visit to physician within the last 2 weeks? ---No  Does the PT have any chronic conditions? (i.e. diabetes, asthma, etc.) ---Yes  List chronic conditions. ---Hypothyroidism, Breast CA  Is this a behavioral health or substance abuse call? ---No     Guidelines    Guideline Title Affirmed Question Affirmed Notes  Eye - Allergy [1] Taking allergy medicine > 2 days AND [2] eyes are very itchy    Final Disposition User   See PCP When Office is Open (within 3 days) Mallie Mussel, RN, Alveta Heimlich    Comments  Appointment was scheduled with Dr. [name] at 1:30pm today.   Referrals  REFERRED TO PCP OFFICE   Disagree/Comply: Comply

## 2016-01-05 NOTE — Patient Instructions (Addendum)
The may well be allergic eye sx   New allergy eye drop   Consider   Adding nasal cortisone  suchas flonase or nasacort every day( OTC)   For at least 2 weeks to see if are suppressed .   Can take  Antihistamine  Trial zyrtec   Instead of  Benadryl.  See if less sedating   Fu with [place] of eye doc if  persistent or progressive

## 2016-01-05 NOTE — Progress Notes (Signed)
Pre visit review using our clinic review tool, if applicable. No additional management support is needed unless otherwise documented below in the visit note.  Chief Complaint  Patient presents with  . Dry and Itchy Eyes    HPI: Donna Manning 65 y.o.  Onset off adn on itchjy eye dry eye attacks   Benadryl helps but   Makes her groggy     Episodes and not constant     But bad when happens  Worse the last week. Benadryl helps  Has tamox causes dry eyes  Had this as a child   Grass pollen Some sneezing   A good bit . No other itching .  No cough .  Used otc meds   . Cant say the names  No contacts    ROS: See pertinent positives and negatives per HPI.  Past Medical History  Diagnosis Date  . GERD (gastroesophageal reflux disease)   . Barrett's esophagus 2009    on EGD  . Hypothyroidism   . History of DVT (deep vein thrombosis) > 30 years ago    after a pregnancy  . History of gastric ulcer     secondary to ASA use  . Dental crowns present   . Breast cancer ([place]) 01/2014    right  . Radiation 03/31/14-04/25/14    Right Breast    Family History  Problem Relation Age of Onset  . Heart failure Mother 22  . Diabetes Mother   . Parkinsonism Father 65  . Stroke Father   . Thyroid disease Sister   . Stomach cancer Paternal Grandfather   . Prostate cancer Brother     Social History   Social History  . Marital Status: Married    Spouse Name: N/A  . Number of Children: 5  . Years of Education: N/A   Occupational History  . homemaker    Social History Main Topics  . Smoking status: Never Smoker   . Smokeless tobacco: Never Used  . Alcohol Use: No  . Drug Use: No  . Sexual Activity: Not Asked   Other Topics Concern  . None   Social History Narrative   Married   Regular Exercise- had not been    hh of 2  3    2  cxats of child    [place] to [facility]    G4 P5    Outpatient Prescriptions Prior to Visit  Medication Sig Dispense Refill  . acyclovir (ZOVIRAX) 400  MG tablet     . levothyroxine (SYNTHROID, LEVOTHROID) 88 MCG tablet TAKE 1 TABLET ONE TIME DAILY 90 tablet 3  . Multiple Vitamins-Minerals (CENTRUM SILVER PO) Take 1 tablet by mouth daily.    . ranitidine (ZANTAC) 150 MG tablet Take 1 tablet twice daily. 180 tablet 4  . tamoxifen (NOLVADEX) 20 MG tablet Take 1 tablet (20 mg total) by mouth daily. 90 tablet 3  . valACYclovir (VALTREX) 1000 MG tablet Take 2 tablets (2,000 mg total) by mouth 2 (two) times daily as needed. 30 tablet 6   No facility-administered medications prior to visit.     EXAM:  BP 104/72 mmHg  Temp(Src) 97.6 F (36.4 C) (Oral)  Ht 5\' 7"  (1.702 m)  Wt 184 lb 8 oz (83.689 kg)  BMI 28.89 kg/m2  Body mass index is 28.89 kg/(m^2).  GENERAL: vitals reviewed and listed above, alert, oriented, appears well hydrated and in no acute distress HEENT: atraumatic, conjunctiva  Clear but lid  Looks irritated no dc  ,  no obvious abnormalities on inspection of external nose and ears OP : no lesion edema or exudate  NECK: no obvious masses on inspection palpation  LUNGS: clear to auscultation bilaterally, no wheezes, rales or rhonchi, good air movement  MS: moves all extremities without noticeable focal  abnormality PSYCH: pleasant and cooperative, no obvious depression or anxiety  ASSESSMENT AND PLAN:  Discussed the following assessment and plan:  Itchy eyes  Breast cancer of upper-outer quadrant of right female breast (HCC) Poss Allergic phenomenon. With history of same occasional sneezing response to antihistamine. Discuss options risk-benefit try topical eyedrops in antihistamine but may want to use nasal cortisone's for suppressive treatment. Follow-up depending on how she is doing. She states her vision has always been sort of bad with floaters but no major changes since started. -Patient advised to return or notify health care team  if symptoms worsen ,persist or new concerns arise.  Patient Instructions  The may well  be allergic eye sx   New allergy eye drop   Consider   Adding nasal cortisone  suchas flonase or nasacort every day( OTC)   For at least 2 weeks to see if are suppressed .   Can take  Antihistamine  Trial zyrtec   Instead of  Benadryl.  See if less sedating   Fu with [place] of eye doc if  persistent or progressive         [name]. [name] M.D.

## 2016-01-25 ENCOUNTER — Other Ambulatory Visit: Payer: Self-pay | Admitting: Radiation Oncology

## 2016-01-25 ENCOUNTER — Other Ambulatory Visit: Payer: Self-pay | Admitting: General Surgery

## 2016-01-25 DIAGNOSIS — Z9889 Other specified postprocedural states: Secondary | ICD-10-CM

## 2016-01-25 DIAGNOSIS — Z853 Personal history of malignant neoplasm of breast: Secondary | ICD-10-CM

## 2016-02-08 ENCOUNTER — Ambulatory Visit
Admission: RE | Admit: 2016-02-08 | Discharge: 2016-02-08 | Disposition: A | Discharge status: Home / Self Care | Payer: Medicare Other | Source: Ambulatory Visit | Attending: Radiation Oncology | Admitting: Radiation Oncology

## 2016-02-08 DIAGNOSIS — R928 Other abnormal and inconclusive findings on diagnostic imaging of breast: Secondary | ICD-10-CM | POA: Diagnosis not present

## 2016-02-08 DIAGNOSIS — Z853 Personal history of malignant neoplasm of breast: Secondary | ICD-10-CM

## 2016-02-08 DIAGNOSIS — Z9889 Other specified postprocedural states: Secondary | ICD-10-CM

## 2016-04-15 ENCOUNTER — Other Ambulatory Visit: Payer: Self-pay | Admitting: Internal Medicine

## 2016-06-04 IMAGING — CR DG CHEST 2V
2 series · 2 of 2 positions shown · non-contrast
Comparison: Two-view chest 05/13/2008

CLINICAL DATA: Preop breast cancer surgery

EXAM:
CHEST  2 VIEW

[w chest pa]
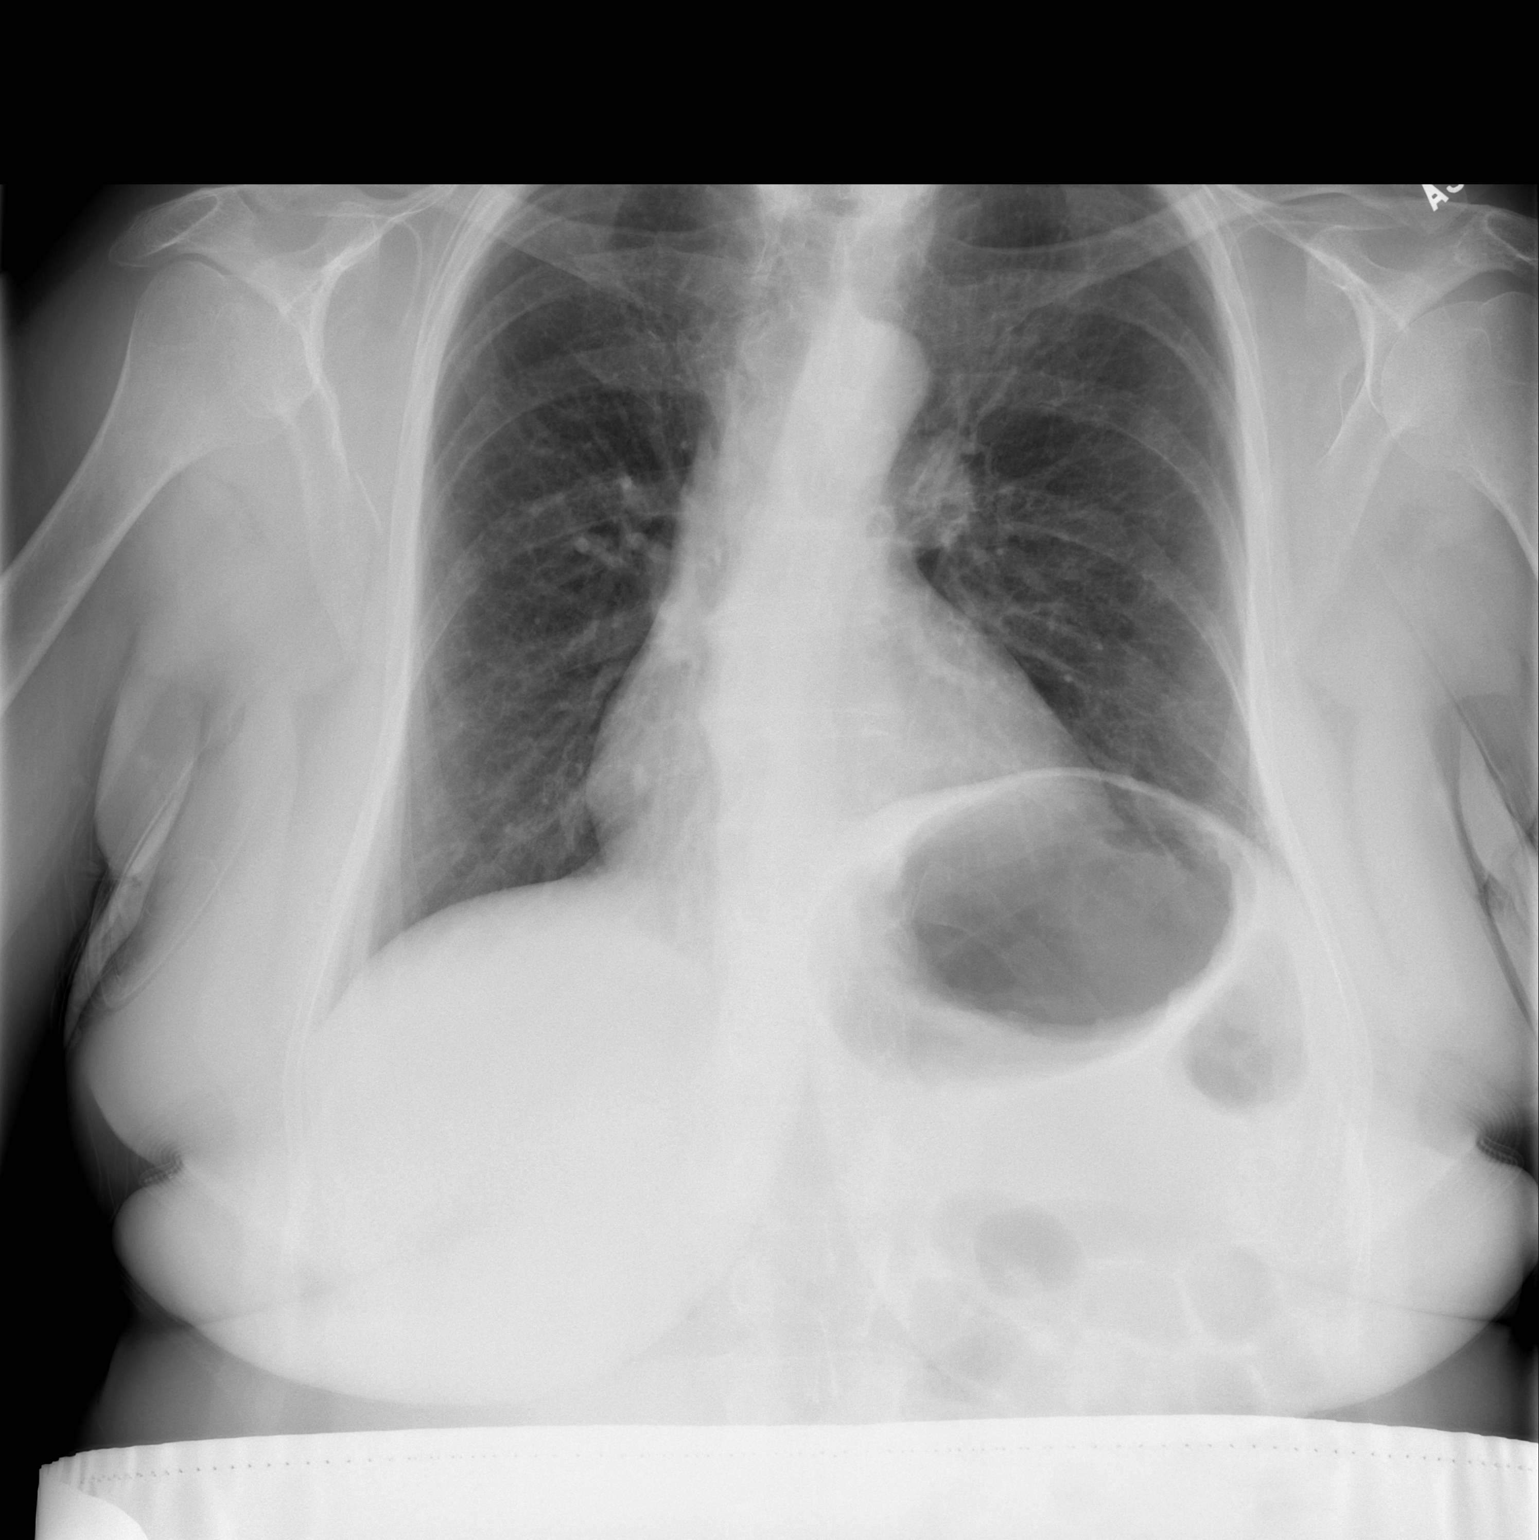

[w chest lat]
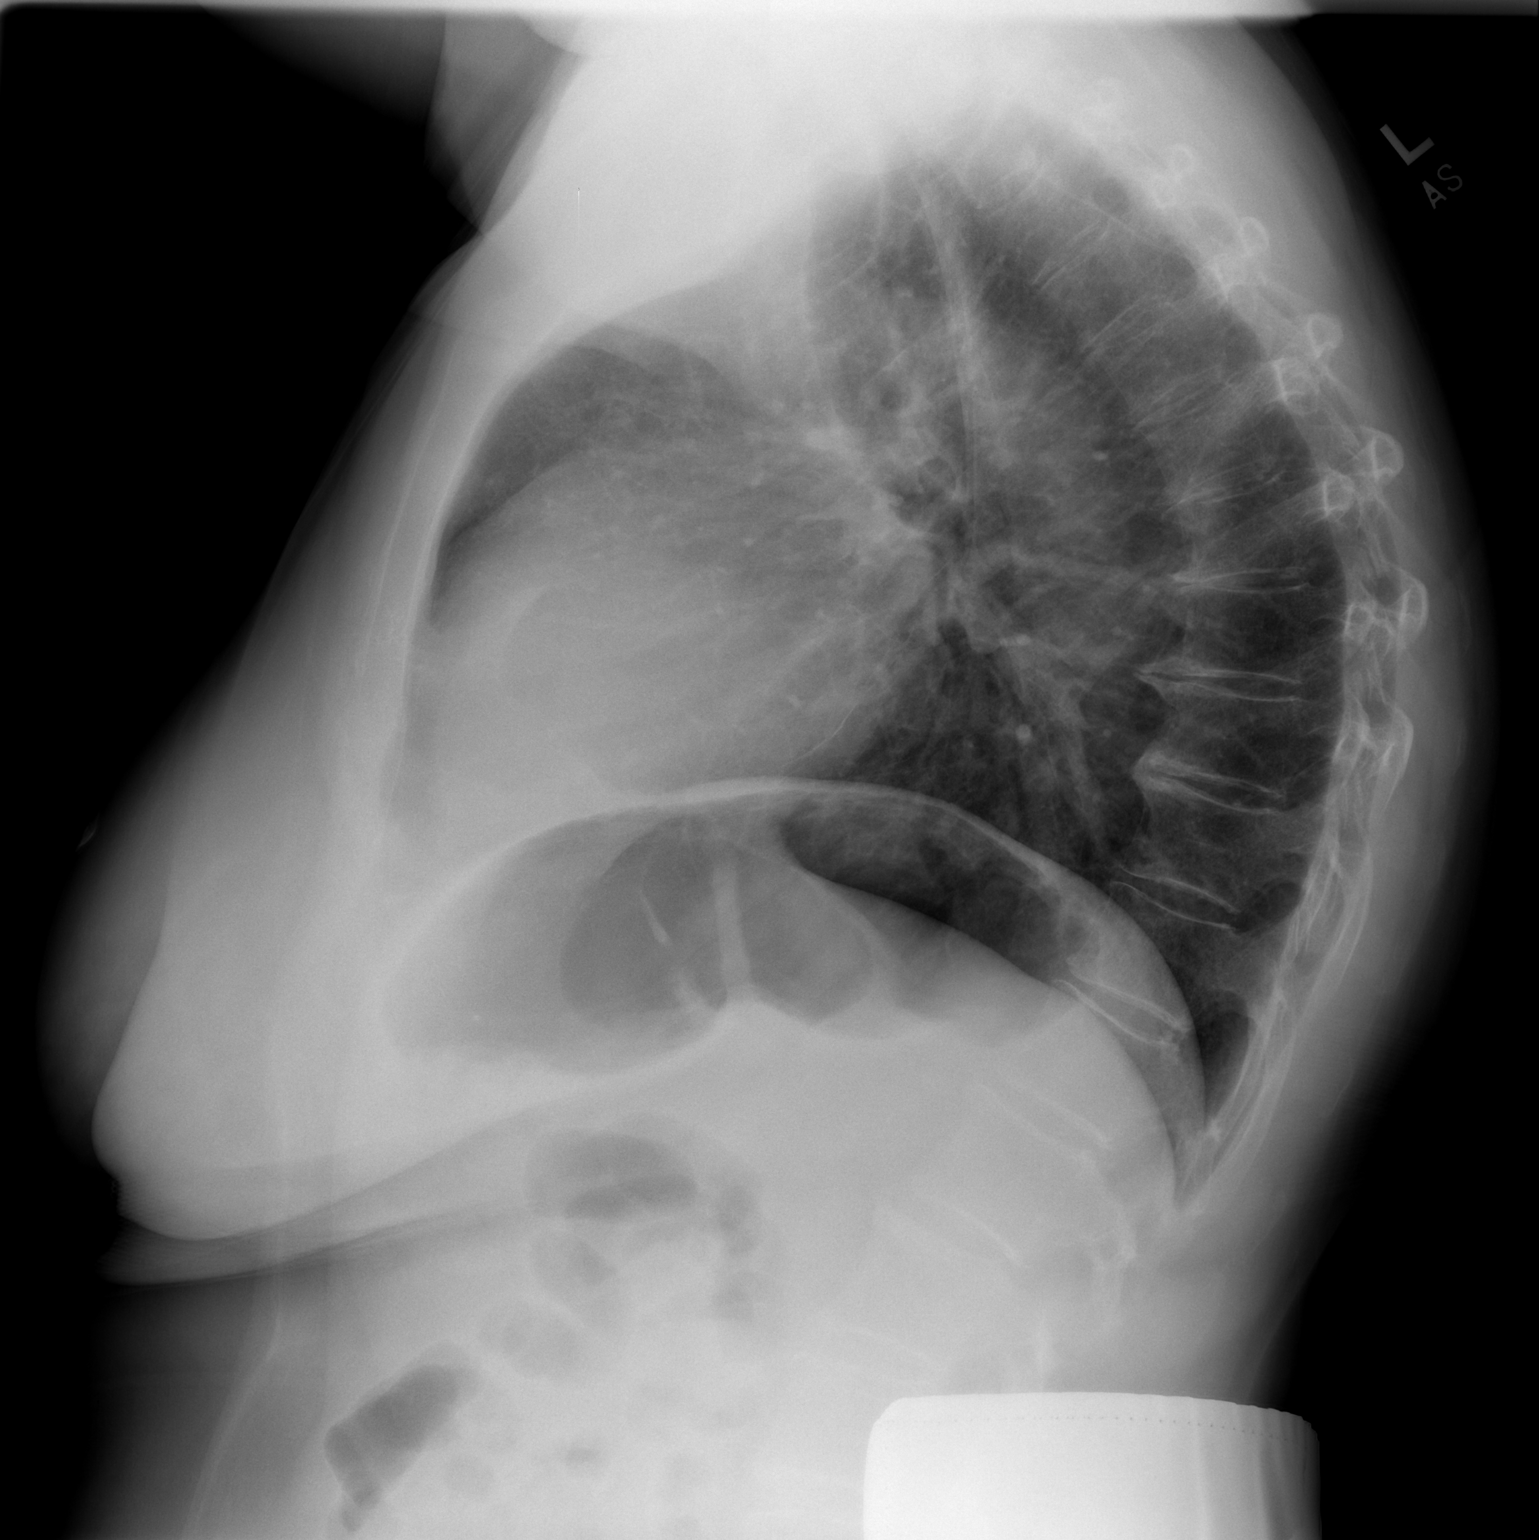

[2 of 2 positions shown; findings below may reference images not displayed]

FINDINGS: Low lung volumes. The heart size and mediastinal contours are within
normal limits. Both lungs are clear. The visualized skeletal
structures are unremarkable. Stable elevation left hemidiaphragm.
IMPRESSION: No active cardiopulmonary disease.

## 2016-06-06 ENCOUNTER — Encounter: Payer: Self-pay | Admitting: Internal Medicine

## 2016-06-06 ENCOUNTER — Ambulatory Visit (INDEPENDENT_AMBULATORY_CARE_PROVIDER_SITE_OTHER): Payer: Medicare Other | Admitting: Internal Medicine

## 2016-06-06 VITALS — BP 108/70 | Temp 98.4°F | Ht 67.0 in | Wt 184.0 lb

## 2016-06-06 DIAGNOSIS — K219 Gastro-esophageal reflux disease without esophagitis: Secondary | ICD-10-CM

## 2016-06-06 DIAGNOSIS — R21 Rash and other nonspecific skin eruption: Secondary | ICD-10-CM | POA: Diagnosis not present

## 2016-06-06 DIAGNOSIS — C50411 Malignant neoplasm of upper-outer quadrant of right female breast: Secondary | ICD-10-CM

## 2016-06-06 DIAGNOSIS — Z79899 Other long term (current) drug therapy: Secondary | ICD-10-CM

## 2016-06-06 DIAGNOSIS — E039 Hypothyroidism, unspecified: Secondary | ICD-10-CM

## 2016-06-06 DIAGNOSIS — R7301 Impaired fasting glucose: Secondary | ICD-10-CM

## 2016-06-06 MED ORDER — TRIAMCINOLONE ACETONIDE 0.1 % EX CREA: 1.0000 "application " | TOPICAL_CREAM | Freq: Two times a day (BID) | CUTANEOUS | 0 refills | Status: DC

## 2016-06-06 NOTE — Progress Notes (Signed)
Pre visit review using our clinic review tool, if applicable. No additional management support is needed unless otherwise documented below in the visit note.  Chief Complaint  Patient presents with  . Insect Bite    Lower back.  Noticed on Friday.  Itchy.    HPI:  MICHA RICCELLI 65 y.o. comes in today  sda   because she's had recurrent itchy bumps that she thought were bug bites including bedbugs over the last month or so she has pictures of some and recently has had a couple on her backside resolving. She wants to make sure it's not shingles. At first she thought they were bedbugs but her husband doesn't have any bites there and only on the waist up shoulders back. No pets.  When they come up they itch a whole lot and then they fade away over days. There is never any pus vesicles tingling. No not painful. No treatment. She does have a history of cold sores.  Asked about the new shingles vaccine up coming.  Asks about yearly blood tests. She sees the oncologist but assumes we will get blood work.  She is hypothyroid on medication each day and is due for a thyroid monitoring test.  Is on tamoxifen anti-cancer.  Is now on ranitidine daily not a PPI.  She may be due for another colonoscopy at seen someone at [facility] and then Dr. Deatra Ina small polyps think she is on a 5 year recall do this past year. ROS: See pertinent positives and negatives per HPI. No current chest pain shortness of breath new unusual symptoms.  Past Medical History:  Diagnosis Date  . Barrett's esophagus 2009   on EGD  . Breast cancer ([place]) 01/2014   right  . Dental crowns present   . GERD (gastroesophageal reflux disease)   . History of DVT (deep vein thrombosis) > 30 years ago   after a pregnancy  . History of gastric ulcer    secondary to ASA use  . Hypothyroidism   . Radiation 03/31/14-04/25/14   Right Breast    Family History  Problem Relation Age of Onset  . Heart failure Mother 63  . Diabetes  Mother   . Parkinsonism Father 73  . Stroke Father   . Thyroid disease Sister   . Stomach cancer Paternal Grandfather   . Prostate cancer Brother     Social History   Social History  . Marital status: Married    Spouse name: N/A  . Number of children: 5  . Years of education: N/A   Occupational History  . homemaker    Social History Main Topics  . Smoking status: Never Smoker  . Smokeless tobacco: Never Used  . Alcohol use No  . Drug use: No  . Sexual activity: Not Asked   Other Topics Concern  . None   Social History Narrative   Married   Regular Exercise- had not been    hh of 2  3    2  cxats of child    [place] to [place]    G4 P5    Outpatient Medications Prior to Visit  Medication Sig Dispense Refill  . acyclovir (ZOVIRAX) 400 MG tablet     . levothyroxine (SYNTHROID, LEVOTHROID) 88 MCG tablet TAKE 1 TABLET EVERY DAY 90 tablet 0  . Multiple Vitamins-Minerals (CENTRUM SILVER PO) Take 1 tablet by mouth daily.    . ranitidine (ZANTAC) 150 MG tablet Take 1 tablet twice daily. 180 tablet 4  . tamoxifen (  NOLVADEX) 20 MG tablet Take 1 tablet (20 mg total) by mouth daily. 90 tablet 3  . azelastine (OPTIVAR) 0.05 % ophthalmic solution Place 1 drop into both eyes 2 (two) times daily. (Patient not taking: Reported on 06/06/2016) 6 mL 6  . valACYclovir (VALTREX) 1000 MG tablet Take 2 tablets (2,000 mg total) by mouth 2 (two) times daily as needed. (Patient not taking: Reported on 06/06/2016) 30 tablet 6   No facility-administered medications prior to visit.      EXAM:  BP 108/70 (BP Location: Left Arm, Patient Position: Sitting, Cuff Size: Large)   Temp 98.4 F (36.9 C) (Oral)   Ht 5\' 7"  (1.702 m)   Wt 184 lb (83.5 kg)   BMI 28.82 kg/m   Body mass index is 28.82 kg/m.  GENERAL: vitals reviewed and listed above, alert, oriented, appears well hydrated and in no acute distress HEENT: atraumatic, conjunctiva  clear, no obvious abnormalities on inspection of external  nose and ears  NECK: no obvious masses on inspection palpation   CV: HRRR, no clubbing cyanosis or  peripheral edema nl cap refill  MS: moves all extremities without noticeable focal  Abnormality Noted rash on her cell phone picture which looks like a blotch or crop of red papules. On her left lower back there are 4 tiny 2 mm feeding pink purplish bumps skin lines intact. No vesicles streaking or pustules. PSYCH: pleasant and cooperative, no obvious depression or anxiety Lab Results  Component Value Date   WBC 4.4 06/24/2015   HGB 12.3 06/24/2015   HCT 37.4 06/24/2015   PLT 255.0 06/24/2015   GLUCOSE 103 (H) 06/24/2015   CHOL 175 06/24/2015   TRIG 142.0 06/24/2015   HDL 42.10 06/24/2015   LDLDIRECT 147.2 11/23/2011   LDLCALC 105 (H) 06/24/2015   ALT 19 06/24/2015   AST 19 06/24/2015   NA 142 06/24/2015   K 4.8 06/24/2015   CL 108 06/24/2015   CREATININE 0.82 06/24/2015   BUN 10 06/24/2015   CO2 28 06/24/2015   TSH 0.91 06/24/2015    ASSESSMENT AND PLAN:  Discussed the following assessment and plan:  Rash and nonspecific skin eruption - Plan: Basic metabolic panel, CBC with Differential/Platelet, Hemoglobin A1c, TSH, Hepatic function panel  Malignant neoplasm of upper-outer quadrant of right female breast, unspecified estrogen receptor status ([place]) - Plan: Basic metabolic panel, CBC with Differential/Platelet, Hemoglobin A1c, TSH, Hepatic function panel  Hypothyroidism, unspecified type - Plan: Basic metabolic panel, CBC with Differential/Platelet, Hemoglobin A1c, TSH, Hepatic function panel  Gastroesophageal reflux disease without esophagitis - Plan: Basic metabolic panel, CBC with Differential/Platelet, Hemoglobin A1c, TSH, Hepatic function panel  Fasting hyperglycemia - Plan: Basic metabolic panel, CBC with Differential/Platelet, Hemoglobin A1c, TSH, Hepatic function panel  Medication management Recurrent rash different unknown cause certainly could look like a bug bite  but atypical in other ways. No one else in the family has these although she is noted to have more frequent insect bites than others. This does not look like shingles done think is related to her treatment for cancer. Try stronger topical get lab work. Due for a thyroid monitoring in the next month as well as chemistry sugar A1c etc. She will call GI about follow-up for colon cancer screening and when she is due assigned a new GI doctor. Follow-up after that depending on results are when wellness is due. She appears to be up-to-date on other parameters. -Patient advised to return or notify health care team  if symptoms worsen ,persist  or new concerns arise. Total visit 3mins > 50% spent counseling and coordinating care as indicated in above note and in instructions to patient .   Patient Instructions  I agree  That theses look like  Bug bites... Fortunately     Not shingles or infection.    Can try   Stronger  Cortisone topical  For sx . If getting  Pustules of blisters let us  Know or recheck .     [name]. Cisco Kindt M.D.

## 2016-06-06 NOTE — Patient Instructions (Signed)
I agree  That theses look like  Bug bites... Fortunately     Not shingles or infection.    Can try   Stronger  Cortisone topical  For sx . If getting  Pustules of blisters let us  Know or recheck .

## 2016-06-24 ENCOUNTER — Other Ambulatory Visit: Payer: Self-pay

## 2016-06-27 ENCOUNTER — Other Ambulatory Visit (INDEPENDENT_AMBULATORY_CARE_PROVIDER_SITE_OTHER): Payer: Medicare Other

## 2016-06-27 ENCOUNTER — Ambulatory Visit (INDEPENDENT_AMBULATORY_CARE_PROVIDER_SITE_OTHER): Payer: Medicare Other | Admitting: Family Medicine

## 2016-06-27 DIAGNOSIS — Z23 Encounter for immunization: Secondary | ICD-10-CM | POA: Diagnosis not present

## 2016-06-27 DIAGNOSIS — R21 Rash and other nonspecific skin eruption: Secondary | ICD-10-CM

## 2016-06-27 DIAGNOSIS — K219 Gastro-esophageal reflux disease without esophagitis: Secondary | ICD-10-CM | POA: Diagnosis not present

## 2016-06-27 DIAGNOSIS — C50411 Malignant neoplasm of upper-outer quadrant of right female breast: Secondary | ICD-10-CM | POA: Diagnosis not present

## 2016-06-27 DIAGNOSIS — R7301 Impaired fasting glucose: Secondary | ICD-10-CM

## 2016-06-27 DIAGNOSIS — E039 Hypothyroidism, unspecified: Secondary | ICD-10-CM

## 2016-06-27 LAB — CBC WITH DIFFERENTIAL/PLATELET
Basophils Absolute: 0 10*3/uL (ref 0.0–0.1)
Basophils Relative: 1.2 % (ref 0.0–3.0)
EOS ABS: 0.3 10*3/uL (ref 0.0–0.7)
Eosinophils Relative: 7.1 % — ABNORMAL HIGH (ref 0.0–5.0)
HCT: 35.2 % — ABNORMAL LOW (ref 36.0–46.0)
HEMOGLOBIN: 11.9 g/dL — AB (ref 12.0–15.0)
Lymphocytes Relative: 30.8 % (ref 12.0–46.0)
Lymphs Abs: 1.2 10*3/uL (ref 0.7–4.0)
MCHC: 33.8 g/dL (ref 30.0–36.0)
MCV: 91.3 fl (ref 78.0–100.0)
MONO ABS: 0.3 10*3/uL (ref 0.1–1.0)
Monocytes Relative: 8.3 % (ref 3.0–12.0)
Neutro Abs: 2.1 10*3/uL (ref 1.4–7.7)
Neutrophils Relative %: 52.6 % (ref 43.0–77.0)
Platelets: 238 10*3/uL (ref 150.0–400.0)
RBC: 3.85 Mil/uL — AB (ref 3.87–5.11)
RDW: 13.2 % (ref 11.5–15.5)
WBC: 3.9 10*3/uL — AB (ref 4.0–10.5)

## 2016-06-27 LAB — HEPATIC FUNCTION PANEL
ALBUMIN: 4.1 g/dL (ref 3.5–5.2)
ALK PHOS: 32 U/L — AB (ref 39–117)
ALT: 27 U/L (ref 0–35)
AST: 15 U/L (ref 0–37)
BILIRUBIN DIRECT: 0.1 mg/dL (ref 0.0–0.3)
Total Bilirubin: 0.5 mg/dL (ref 0.2–1.2)
Total Protein: 6.3 g/dL (ref 6.0–8.3)

## 2016-06-27 LAB — BASIC METABOLIC PANEL
BUN: 11 mg/dL (ref 6–23)
CO2: 27 mEq/L (ref 19–32)
CREATININE: 0.86 mg/dL (ref 0.40–1.20)
Calcium: 8.8 mg/dL (ref 8.4–10.5)
Chloride: 106 mEq/L (ref 96–112)
GFR: 70.01 mL/min (ref >60.00)
Glucose, Bld: 106 mg/dL — ABNORMAL HIGH (ref 70–99)
Potassium: 4.1 mEq/L (ref 3.5–5.1)
Sodium: 140 mEq/L (ref 135–145)

## 2016-06-27 LAB — HEMOGLOBIN A1C: HEMOGLOBIN A1C: 6 % (ref 4.6–6.5)

## 2016-06-27 LAB — TSH: TSH: 1.39 u[IU]/mL (ref 0.35–4.50)

## 2016-07-13 ENCOUNTER — Other Ambulatory Visit: Payer: Self-pay | Admitting: Family Medicine

## 2016-07-13 DIAGNOSIS — D649 Anemia, unspecified: Secondary | ICD-10-CM

## 2016-08-15 DIAGNOSIS — H02831 Dermatochalasis of right upper eyelid: Secondary | ICD-10-CM | POA: Diagnosis not present

## 2016-08-15 DIAGNOSIS — H02834 Dermatochalasis of left upper eyelid: Secondary | ICD-10-CM | POA: Diagnosis not present

## 2016-08-15 DIAGNOSIS — H25013 Cortical age-related cataract, bilateral: Secondary | ICD-10-CM | POA: Diagnosis not present

## 2016-08-30 ENCOUNTER — Other Ambulatory Visit: Payer: Self-pay | Admitting: Internal Medicine

## 2016-08-31 NOTE — Telephone Encounter (Signed)
Sent to the pharmacy by e-scribe.  Pt had last TSH on 06/27/16.  Has future labs scheduled for 11/21/16 and follow up on  11/28/16.

## 2016-09-08 ENCOUNTER — Ambulatory Visit (HOSPITAL_BASED_OUTPATIENT_CLINIC_OR_DEPARTMENT_OTHER): Payer: Medicare Other | Admitting: Hematology and Oncology

## 2016-09-08 ENCOUNTER — Encounter: Payer: Self-pay | Admitting: Hematology and Oncology

## 2016-09-08 ENCOUNTER — Ambulatory Visit (HOSPITAL_BASED_OUTPATIENT_CLINIC_OR_DEPARTMENT_OTHER): Payer: Medicare Other

## 2016-09-08 ENCOUNTER — Other Ambulatory Visit: Payer: Self-pay

## 2016-09-08 VITALS — BP 104/47 | HR 71 | Temp 98.3°F | Resp 18 | Ht 67.0 in | Wt 182.4 lb

## 2016-09-08 DIAGNOSIS — C50411 Malignant neoplasm of upper-outer quadrant of right female breast: Secondary | ICD-10-CM

## 2016-09-08 DIAGNOSIS — M810 Age-related osteoporosis without current pathological fracture: Secondary | ICD-10-CM | POA: Diagnosis not present

## 2016-09-08 DIAGNOSIS — Z86718 Personal history of other venous thrombosis and embolism: Secondary | ICD-10-CM | POA: Diagnosis not present

## 2016-09-08 DIAGNOSIS — Z1589 Genetic susceptibility to other disease: Secondary | ICD-10-CM

## 2016-09-08 DIAGNOSIS — Z170 Estrogen receptor positive status [ER+]: Principal | ICD-10-CM

## 2016-09-08 DIAGNOSIS — Z832 Family history of diseases of the blood and blood-forming organs and certain disorders involving the immune mechanism: Secondary | ICD-10-CM

## 2016-09-08 DIAGNOSIS — E538 Deficiency of other specified B group vitamins: Secondary | ICD-10-CM

## 2016-09-08 DIAGNOSIS — Z79810 Long term (current) use of selective estrogen receptor modulators (SERMs): Secondary | ICD-10-CM

## 2016-09-08 DIAGNOSIS — E039 Hypothyroidism, unspecified: Secondary | ICD-10-CM | POA: Diagnosis not present

## 2016-09-08 LAB — COMPREHENSIVE METABOLIC PANEL
ALBUMIN: 3.8 g/dL (ref 3.5–5.0)
ALK PHOS: 35 U/L — AB (ref 40–150)
ALT: 17 U/L (ref 0–55)
ANION GAP: 7 meq/L (ref 3–11)
AST: 18 U/L (ref 5–34)
BILIRUBIN TOTAL: 0.43 mg/dL (ref 0.20–1.20)
BUN: 12.4 mg/dL (ref 7.0–26.0)
CALCIUM: 9.1 mg/dL (ref 8.4–10.4)
CO2: 26 meq/L (ref 22–29)
CREATININE: 0.8 mg/dL (ref 0.6–1.1)
Chloride: 108 mEq/L (ref 98–109)
EGFR: 77 mL/min/{1.73_m2} — AB (ref >90)
Glucose: 104 mg/dl (ref 70–140)
Potassium: 4.3 mEq/L (ref 3.5–5.1)
Sodium: 141 mEq/L (ref 136–145)
TOTAL PROTEIN: 6.6 g/dL (ref 6.4–8.3)

## 2016-09-08 MED ORDER — RANITIDINE HCL 150 MG PO TABS: 150.0000 mg | ORAL_TABLET | Freq: Every day | ORAL | 4 refills | Status: DC

## 2016-09-08 NOTE — Assessment & Plan Note (Signed)
Right breast invasive ductal carcinoma T2, N0, M0 stage IIA 0.9 cm and a separate nodule 0.25 cm ER/PR positive HER-2 negative, Oncotype DX recurrence score is 14 (9% risk of distant recurrence over 10 years with tamoxifen alone) status post radiation therapy, started antiestrogen therapy with anastrozole in January 2016 years switched to tamoxifen 09/23/2014 (due to osteoporosis)  Tamoxifen toxicities: Endometrial thickening: Patient is following with her gynecologist. The plan is to watch her for 3 months and repeat vaginal ultrasound. She does not have any symptoms of bleeding. She does have a white discharge.  Osteoporosis: DEXA scan November 2015 showed a T score of -2.6 suggestive of osteoporosis. Hence we changed to treatment to tamoxifen. Patient had a lot of bone pain related to oral bisphosphonate therapy. I recommended Prolia but after much thought she decided not to take it. She will need another bone density. We will order that.  Breast Cancer Surveillance: 1. Breast exam 09/08/2016: Normal 2. Mammogram 02/08/2016 No abnormalities. Postsurgical changes. Breast Density Category C. I recommended that she get 3-D mammograms for surveillance. Discussed the differences between different breast density categories.  RTC in 1 year

## 2016-09-08 NOTE — Progress Notes (Signed)
Patient Care Team: Burnis Medin, MD as PCP - General [name], MD as Attending Physician (Obstetrics and Gynecology) Sharyne Peach, MD (Ophthalmology) Fanny Skates, MD as Consulting Physician (General Surgery) Thea Silversmith, MD (Inactive) as Consulting Physician (Radiation Oncology)  DIAGNOSIS:  Encounter Diagnoses  Name Primary?  . Malignant neoplasm of upper-outer quadrant of right breast in female, estrogen receptor positive ([name])   . Family history of hypercoagulability Yes    SUMMARY OF ONCOLOGIC HISTORY:   Breast cancer of upper-outer quadrant of right female breast ([name])   12/24/2013 Initial Diagnosis    Breast cancer of upper-outer quadrant of right female breast: Initial biopsy showed DCIS with calcifications and atypical ductal hyperplasia with suspicion of stromal invasion ER 100% PR 100%      01/20/2014 Surgery    Right breast lumpectomy, IDC grade 1; 3.9 cm and 0.25 cm with DCIS int grade with ALH 3 SLN negative superior margin positive, ER 100% PR 100% HER-2 negative ratio 1.1 Ki-67 3% Oncotype 14 low risk       02/05/2014 Surgery    Reexcision of the superior margin no residual cancer      04/17/2014 - 05/16/2014 Radiation Therapy    Adjuvant radiation therapy      07/25/2014 -  Anti-estrogen oral therapy    Anastrozole 1 mg daily switched to tamoxifen 20 mg daily from 09/23/2014 due to osteoporosis       CHIEF COMPLIANT: Follow-up on tamoxifen therapy  INTERVAL HISTORY: Donna Manning is a 66 year old with above-mentioned history of right breast cancer treated with lumpectomy radiation and is currently on oral antiestrogen therapy. She is currently on tamoxifen. She appears to be tolerating it fairly well. She does complain of fatigue. She tells me that her daughter was diagnosed with MTHFR gene mutation. This was part of the workup done for pregnancy losses. She wanted to know if she needs to get this workup done.  REVIEW OF SYSTEMS:     Constitutional: Denies fevers, chills or abnormal weight loss Eyes: Denies blurriness of vision Ears, nose, mouth, throat, and face: Denies mucositis or sore throat Respiratory: Denies cough, dyspnea or wheezes Cardiovascular: Denies palpitation, chest discomfort Gastrointestinal:  Denies nausea, heartburn or change in bowel habits Skin: Denies abnormal skin rashes Lymphatics: Denies new lymphadenopathy or easy bruising Neurological:Denies numbness, tingling or new weaknesses Behavioral/Psych: Mood is stable, no new changes  Extremities: No lower extremity edema Breast:  denies any pain or lumps or nodules in either breasts All other systems were reviewed with the patient and are negative.  I have reviewed the past medical history, past surgical history, social history and family history with the patient and they are unchanged from previous note.  ALLERGIES:  is allergic to actonel [risedronate sodium]; contrast media [iodinated diagnostic agents]; and starch.  MEDICATIONS:  Current Outpatient Prescriptions  Medication Sig Dispense Refill  . acyclovir (ZOVIRAX) 400 MG tablet     . azelastine (OPTIVAR) 0.05 % ophthalmic solution Place 1 drop into both eyes 2 (two) times daily. (Patient not taking: Reported on 06/06/2016) 6 mL 6  . levothyroxine (SYNTHROID, LEVOTHROID) 88 MCG tablet TAKE 1 TABLET EVERY DAY 90 tablet 1  . Multiple Vitamins-Minerals (CENTRUM SILVER PO) Take 1 tablet by mouth daily.    . ranitidine (ZANTAC) 150 MG tablet Take 1 tablet (150 mg total) by mouth at bedtime. Take 1 tablet twice daily. 180 tablet 4  . tamoxifen (NOLVADEX) 20 MG tablet Take 1 tablet (20 mg total) by mouth daily.  90 tablet 3   No current facility-administered medications for this visit.     PHYSICAL EXAMINATION: ECOG PERFORMANCE STATUS: 1 - Symptomatic but completely ambulatory  Vitals:   09/08/16 0915  BP: (!) 104/47  Pulse: 71  Resp: 18  Temp: 98.3 F (36.8 C)   Filed Weights    09/08/16 0915  Weight: 182 lb 6.4 oz (82.7 kg)    GENERAL:alert, no distress and comfortable SKIN: skin color, texture, turgor are normal, no rashes or significant lesions EYES: normal, Conjunctiva are pink and non-injected, sclera clear OROPHARYNX:no exudate, no erythema and lips, buccal mucosa, and tongue normal  NECK: supple, thyroid normal size, non-tender, without nodularity LYMPH:  no palpable lymphadenopathy in the cervical, axillary or inguinal LUNGS: clear to auscultation and percussion with normal breathing effort HEART: regular rate & rhythm and no murmurs and no lower extremity edema ABDOMEN:abdomen soft, non-tender and normal bowel sounds MUSCULOSKELETAL:no cyanosis of digits and no clubbing  NEURO: alert & oriented x 3 with fluent speech, no focal motor/sensory deficits EXTREMITIES: No lower extremity edema BREAST: No palpable masses or nodules in either right or left breasts. No palpable axillary supraclavicular or infraclavicular adenopathy no breast tenderness or nipple discharge. (exam performed in the presence of a chaperone)  LABORATORY DATA:  I have reviewed the data as listed   Chemistry      Component Value Date/Time   NA 141 09/08/2016 1007   K 4.3 09/08/2016 1007   CL 106 06/27/2016 0814   CO2 26 09/08/2016 1007   BUN 12.4 09/08/2016 1007   CREATININE 0.8 09/08/2016 1007      Component Value Date/Time   CALCIUM 9.1 09/08/2016 1007   ALKPHOS 35 (L) 09/08/2016 1007   AST 18 09/08/2016 1007   ALT 17 09/08/2016 1007   BILITOT 0.43 09/08/2016 1007       Lab Results  Component Value Date   WBC 3.9 (L) 06/27/2016   HGB 11.9 (L) 06/27/2016   HCT 35.2 (L) 06/27/2016   MCV 91.3 06/27/2016   PLT 238.0 06/27/2016   NEUTROABS 2.1 06/27/2016    ASSESSMENT & PLAN:  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  Osteoporosis: DEXA scan November 2015 showed a T score of -2.6 suggestive of osteoporosis. Hence we changed to treatment to tamoxifen. Patient had a lot of bone pain related to oral bisphosphonate therapy. I recommended Prolia but after much thought she decided not to take it. She will need another bone density.  MTHFR gene Mutation: I discussed with the patient that without hyper homocystinemia, this gene mutation does not increase risk of blood clots. Since his stress sending for the gene mutation, I recommended sending for homocysteine level along with B 12 levels. If homocysteine levels are elevated then we can consider sending for the gene mutation. I will call her with the result of this test.  Breast Cancer Surveillance: 1. Breast exam 09/08/2016: Normal 2. Mammogram 02/08/2016 No abnormalities. Postsurgical changes. Breast Density Category C. I recommended that she get 3-D mammograms for surveillance. Discussed the differences between different breast density  categories.  RTC in 1 year    I spent 25 minutes talking to the patient of which more than half was spent in counseling and coordination of care.  Orders Placed This Encounter  Procedures  . Comprehensive metabolic panel    Standing Status:   Future    Number of Occurrences:   1    Standing Expiration Date:   09/08/2017  . Vitamin B12    Standing Status:   Future    Number of Occurrences:   1    Standing Expiration Date:   09/08/2017  . Homocysteine, serum    Standing Status:   Future    Number of Occurrences:   1    Standing Expiration Date:   09/08/2017   The patient has a good  understanding of the overall plan. she agrees with it. she will call with any problems that may develop before the next visit here.   Rulon Eisenmenger, MD 09/08/16

## 2016-09-09 LAB — HOMOCYSTEINE: Homocysteine: 8.3 umol/L (ref 0.0–15.0)

## 2016-09-09 LAB — VITAMIN B12: Vitamin B12: 485 pg/mL (ref 232–1245)

## 2016-09-12 ENCOUNTER — Telehealth: Payer: Self-pay | Admitting: Hematology and Oncology

## 2016-09-12 NOTE — Telephone Encounter (Signed)
I called the patient with the results of serum homocysteine and B 12 and LFTs. Homocysteine level is normal and B 12 level is normal as well. I discussed with her that there is no need for MTHFR gene mutation testing. (Her daughter was apparently tested and was found to be positive for this)

## 2016-09-20 ENCOUNTER — Encounter: Payer: Self-pay | Admitting: Gastroenterology

## 2016-09-20 ENCOUNTER — Ambulatory Visit (INDEPENDENT_AMBULATORY_CARE_PROVIDER_SITE_OTHER): Payer: Medicare Other | Admitting: Gastroenterology

## 2016-09-20 VITALS — BP 102/68 | HR 84 | Ht 67.0 in | Wt 182.4 lb

## 2016-09-20 DIAGNOSIS — K219 Gastro-esophageal reflux disease without esophagitis: Secondary | ICD-10-CM | POA: Diagnosis not present

## 2016-09-20 DIAGNOSIS — R109 Unspecified abdominal pain: Secondary | ICD-10-CM

## 2016-09-20 DIAGNOSIS — Z860100 Personal history of colon polyps, unspecified: Secondary | ICD-10-CM

## 2016-09-20 DIAGNOSIS — K2270 Barrett's esophagus without dysplasia: Secondary | ICD-10-CM

## 2016-09-20 MED ORDER — RANITIDINE HCL 150 MG PO TABS: 150.0000 mg | ORAL_TABLET | Freq: Every day | ORAL | 4 refills | Status: DC

## 2016-09-20 NOTE — Patient Instructions (Signed)
Will will obtain your colonoscopy report from [facility]  Use IBGard 1 capsule three times a day

## 2016-09-20 NOTE — Progress Notes (Signed)
Donna Manning    XO:[id number]    08/04/1950  Primary Care Physician:[name],WANDA Harmon Pier, MD  Referring Physician: Burnis Medin, MD [place], [place] [address]  Chief complaint:  GERD HPI: 66 year old female with history of breast cancer diagnosed in June 2015 status post lumpectomy and adjuvant radiation therapy, chronic GERD here for follow-up visit. She was last seen in June 2016 by Dr. Deatra Ina. On EGD in June 2016 Dr. Deatra Ina found a very short segment of [place] pink mucosa at squamocolumnar junction concerning for Barrett's esophagus that was about 1cm long, biopsies did not show any evidence of intestinal metaplasia. Per patient she was diagnosed with Barrett's esophagus by Dr. Harrell Lark at [facility], reviewed reports from November 2009. On EGD she is described to have esophagitis in the distal esophagus and random biopsies were obtained but no description of Barrett's esophagus and pathology report is not available during this visit for review. On colonoscopy 7 mm polyp was removed from rectum, pathology report is not available to determine if it was adenoma or hyperplastic polyp. Patient thinks she may have had 3 EGDs and 2 colonoscopies at [facility] prior to 2009. She is currently taking ranitidine at bedtime with minimal breakthrough symptoms occasionally, less than once or twice a month. Denies any dysphagia, odynophagia, change in bowel habits, nausea, vomiting, abdominal pain, melena or bright red blood per rectum Review of systems patient complains of excessive bloating and intermittent cramps in lower abdomen.     Outpatient Encounter Prescriptions as of 09/20/2016  Medication Sig  . acyclovir (ZOVIRAX) 400 MG tablet Take 400 mg by mouth as needed.   Marland Kitchen levothyroxine (SYNTHROID, LEVOTHROID) 88 MCG tablet TAKE 1 TABLET EVERY DAY  . Multiple Vitamins-Minerals (CENTRUM SILVER PO) Take 1 tablet by mouth daily.  .  ranitidine (ZANTAC) 150 MG tablet Take 1 tablet (150 mg total) by mouth at bedtime. Take 1 tablet twice daily.  . tamoxifen (NOLVADEX) 20 MG tablet Take 1 tablet (20 mg total) by mouth daily.  . [DISCONTINUED] azelastine (OPTIVAR) 0.05 % ophthalmic solution Place 1 drop into both eyes 2 (two) times daily. (Patient not taking: Reported on 06/06/2016)   No facility-administered encounter medications on file as of 09/20/2016.     Allergies as of 09/20/2016 - Review Complete 09/20/2016  Allergen Reaction Noted  . Actonel [risedronate sodium] Other (See Comments) 12/03/2011  . Contrast media [iodinated diagnostic agents] Hives 01/13/2014  . Starch Rash 01/01/2014    Past Medical History:  Diagnosis Date  . Barrett's esophagus 2009   on EGD  . Breast cancer ([place]) 01/2014   right  . Dental crowns present   . GERD (gastroesophageal reflux disease)   . History of DVT (deep vein thrombosis) > 30 years ago   after a pregnancy  . History of gastric ulcer    secondary to ASA use  . Hypothyroidism   . Radiation 03/31/14-04/25/14   Right Breast    Past Surgical History:  Procedure Laterality Date  . BREAST BIOPSY Right 1990  . COLONOSCOPY    . PARTIAL MASTECTOMY WITH NEEDLE LOCALIZATION AND AXILLARY SENTINEL LYMPH NODE BX Right 01/20/2014   Procedure: RIGHT PARTIAL MASTECTOMY WITH DOUBLE  NEEDLE LOCALIZATION (BRACKETED )AND AXILLARY SENTINEL LYMPH NODE BX;  Surgeon: [name], MD;  Location: [place];  Service: General;  Laterality: Right;  And [place].  Marland Kitchen RE-EXCISION OF BREAST CANCER,SUPERIOR MARGINS Right 02/05/2014  Procedure: RIGHT LUMPECTOMY, RE-EXCISION OF BREAST CANCER,SUPERIOR MARGINS;  Surgeon: [name], MD;  Location: [place];  Service: General;  Laterality: Right;  . UPPER GI ENDOSCOPY      Family History  Problem Relation Age of Onset  . Heart failure Mother 70  . Diabetes Mother   . Parkinsonism Father 70  . Stroke Father   .  Thyroid disease Sister   . Prostate cancer Brother   . Stomach cancer Paternal Grandfather     Social History   Social History  . Marital status: Married    Spouse name: N/A  . Number of children: 5  . Years of education: N/A   Occupational History  . homemaker    Social History Main Topics  . Smoking status: Never Smoker  . Smokeless tobacco: Never Used  . Alcohol use No  . Drug use: No  . Sexual activity: Not on file   Other Topics Concern  . Not on file   Social History Narrative   Married   Regular Exercise- had not been    hh of 2  3    2  cxats of child    [place] to [place]    G4 P5      Review of systems: Review of Systems  Constitutional: Negative for fever and chills.  HENT: Negative.   Eyes: Negative for blurred vision.  Respiratory: Negative for cough, shortness of breath and wheezing.   Cardiovascular: Negative for chest pain and palpitations.  Gastrointestinal: as per HPI Genitourinary: Negative for dysuria, urgency, frequency and hematuria.  Musculoskeletal: Negative for myalgias, back pain and joint pain.  Skin: Negative for itching and rash.  Neurological: Negative for dizziness, tremors, focal weakness, seizures and loss of consciousness.  Endo/Heme/Allergies: Positive for seasonal allergies.  Psychiatric/Behavioral: Negative for depression, suicidal ideas and hallucinations.  All other systems reviewed and are negative.   Physical Exam: Vitals:   09/20/16 1445  BP: 102/68  Pulse: 84   Body mass index is 28.56 kg/m. Gen:      No acute distress HEENT:  EOMI, sclera anicteric Neck:     No masses; no thyromegaly Lungs:    Clear to auscultation bilaterally; normal respiratory effort CV:         Regular rate and rhythm; no murmurs Abd:      + bowel sounds; soft, non-tender; no palpable masses, no distension Ext:    No edema; adequate peripheral perfusion Skin:      Warm and dry; no rash Neuro: alert and oriented x 3 Psych: normal mood and  affect  Data Reviewed:  Reviewed labs, radiology imaging, old records and pertinent past GI work up   Assessment and Plan/Recommendations:  66 year old female with history of chronic GERD, ? Barrett's esophagus and colon polyps here for follow-up visit  GERD symptoms: Currently well controlled with ranitidine 300 mg at bedtime Discussed antireflux measures in detail Barrett's esophagus: EGD in June 2016 showed a very short segment of Barrett's on endoscopy but biopsies did not show any intestinal metaplasia suggestive of Barrett's We will obtain records from [facility] to review prior EGD and pathology report to determine surveillance  Colonoscopy in 2009 with removal of rectal polyp: Need pathology report to determine the timing of surveillance endoscopy  Intermittent bloating and abdominal cramps likely irritable bowel syndrome Use IB Gaurd 1-2 capsules 3 times daily as needed Small meals and avoid excessive fiber especially raw vegetables   Greater than 50% of the time  used for counseling as well as treatment plan and follow-up. She had multiple questions which were answered to her satisfaction  K. Denzil Magnuson , MD [phone] Mon-Fri 8a-5p [phone] after 5p, weekends, holidays  CC: [name], [name], MD

## 2016-09-21 ENCOUNTER — Telehealth: Payer: Self-pay | Admitting: Gastroenterology

## 2016-09-21 MED ORDER — RANITIDINE HCL 150 MG PO TABS: 150.0000 mg | ORAL_TABLET | Freq: Every day | ORAL | 4 refills | Status: DC

## 2016-09-21 NOTE — Telephone Encounter (Signed)
Zantac sent to [facility]

## 2016-09-29 ENCOUNTER — Telehealth: Payer: Self-pay | Admitting: *Deleted

## 2016-09-29 NOTE — Telephone Encounter (Signed)
Called pt and she stated she was taking 1 twice daily so informed [facility] pharmacy that she was taking twice a day 150 mg and to send her a 90 day supply

## 2016-10-04 ENCOUNTER — Other Ambulatory Visit: Payer: Self-pay | Admitting: Hematology and Oncology

## 2016-10-07 ENCOUNTER — Other Ambulatory Visit: Payer: Self-pay | Admitting: Emergency Medicine

## 2016-10-07 ENCOUNTER — Telehealth: Payer: Self-pay | Admitting: Emergency Medicine

## 2016-10-07 MED ORDER — VALACYCLOVIR HCL 1 G PO TABS: 2000.0000 mg | ORAL_TABLET | Freq: Two times a day (BID) | ORAL | 1 refills | Status: DC | PRN

## 2016-10-07 NOTE — Telephone Encounter (Signed)
Spoke with pt and she stated that it was okay to sen din the Valtrex and to remove the Acyclovir. Pt also stated that if she wasn't happy with the Valtrex she would want to switch back

## 2016-10-07 NOTE — Telephone Encounter (Signed)
Ok to send in valtrex but  confirm with patient that acyclovir will be taken off the list.

## 2016-10-07 NOTE — Telephone Encounter (Signed)
Pt would like prescription for Valacyclovir 1 gram. Please advise.   [place]

## 2016-10-07 NOTE — Addendum Note (Signed)
Addended byShanon Ace K on: 10/07/2016 02:01 PM   Modules accepted: Orders

## 2016-10-17 ENCOUNTER — Telehealth: Payer: Self-pay | Admitting: Internal Medicine

## 2016-10-17 MED ORDER — VALACYCLOVIR HCL 1 G PO TABS: 2000.0000 mg | ORAL_TABLET | Freq: Two times a day (BID) | ORAL | 1 refills | Status: DC | PRN

## 2016-10-17 NOTE — Telephone Encounter (Signed)
° ° ° ° ° °  Pt call to say she only need 30 pills and not 90. [facility] does not do 90 so she need 30 called in to the below pharmacy   valACYclovir (VALTREX) 1000 MG tablet   [facility]

## 2016-10-17 NOTE — Telephone Encounter (Signed)
Refill sent in

## 2016-11-21 ENCOUNTER — Other Ambulatory Visit (INDEPENDENT_AMBULATORY_CARE_PROVIDER_SITE_OTHER): Payer: Medicare Other

## 2016-11-21 DIAGNOSIS — D649 Anemia, unspecified: Secondary | ICD-10-CM

## 2016-11-21 LAB — CBC WITH DIFFERENTIAL/PLATELET
Basophils Absolute: 0.1 10*3/uL (ref 0.0–0.1)
Basophils Relative: 1.3 % (ref 0.0–3.0)
EOS PCT: 5 % (ref 0.0–5.0)
Eosinophils Absolute: 0.2 10*3/uL (ref 0.0–0.7)
HCT: 36.8 % (ref 36.0–46.0)
Hemoglobin: 12.2 g/dL (ref 12.0–15.0)
LYMPHS ABS: 1.5 10*3/uL (ref 0.7–4.0)
Lymphocytes Relative: 37.5 % (ref 12.0–46.0)
MCHC: 33.2 g/dL (ref 30.0–36.0)
MCV: 93 fl (ref 78.0–100.0)
MONO ABS: 0.4 10*3/uL (ref 0.1–1.0)
Monocytes Relative: 9.2 % (ref 3.0–12.0)
NEUTROS ABS: 1.9 10*3/uL (ref 1.4–7.7)
NEUTROS PCT: 47 % (ref 43.0–77.0)
PLATELETS: 218 10*3/uL (ref 150.0–400.0)
RBC: 3.95 Mil/uL (ref 3.87–5.11)
RDW: 12.6 % (ref 11.5–15.5)
WBC: 4 10*3/uL (ref 4.0–10.5)

## 2016-11-21 LAB — IBC PANEL
IRON: 95 ug/dL (ref 42–145)
Saturation Ratios: 32.5 % (ref 20.0–50.0)
Transferrin: 209 mg/dL — ABNORMAL LOW (ref 212.0–360.0)

## 2016-11-21 LAB — FERRITIN: FERRITIN: 134.8 ng/mL (ref 10.0–291.0)

## 2016-11-28 ENCOUNTER — Encounter: Payer: Self-pay | Admitting: Internal Medicine

## 2016-11-28 ENCOUNTER — Ambulatory Visit (INDEPENDENT_AMBULATORY_CARE_PROVIDER_SITE_OTHER): Payer: Medicare Other | Admitting: Internal Medicine

## 2016-11-28 VITALS — BP 100/80 | HR 68 | Temp 98.0°F | Ht 67.0 in | Wt 181.1 lb

## 2016-11-28 DIAGNOSIS — K219 Gastro-esophageal reflux disease without esophagitis: Secondary | ICD-10-CM

## 2016-11-28 DIAGNOSIS — D649 Anemia, unspecified: Secondary | ICD-10-CM

## 2016-11-28 DIAGNOSIS — C50411 Malignant neoplasm of upper-outer quadrant of right female breast: Secondary | ICD-10-CM

## 2016-11-28 NOTE — Progress Notes (Signed)
Chief Complaint  Patient presents with  . Follow-up    HPI: Donna Manning 66 y.o. come in for Chronic disease management   Fu anemia I  checking for iron deficiency  seh has seen new Gi     Plan of care to decide on surveillance  No change in med but told to add peppermint ofr gi upset trial  But doing ok now. On ranitidine  Bid  No dysphagia unintended weigh loss  Has had 3 episodes of "food poisoning  With vomiting and diarrhea lasting about 4 hours  In am   Separate times   But no pain or illness otherwise    osteoporosis by dexa but couldn't tolerate  meds had bone pain etc .    ROS: See pertinent positives and negatives per HPI. Gets flushes with  Tamoxifen still see  Gyne  Still tired  No bleeding   Past Medical History:  Diagnosis Date  . Barrett's esophagus 2009   on EGD  . Breast cancer ([place]) 01/2014   right  . Dental crowns present   . GERD (gastroesophageal reflux disease)   . History of DVT (deep vein thrombosis) > 30 years ago   after a pregnancy  . History of gastric ulcer    secondary to ASA use  . Hypothyroidism   . Radiation 03/31/14-04/25/14   Right Breast    Family History  Problem Relation Age of Onset  . Heart failure Mother 49  . Diabetes Mother   . Parkinsonism Father 82  . Stroke Father   . Thyroid disease Sister   . Prostate cancer Brother   . Stomach cancer Paternal Grandfather     Social History   Social History  . Marital status: Married    Spouse name: N/A  . Number of children: 5  . Years of education: N/A   Occupational History  . homemaker    Social History Main Topics  . Smoking status: Never Smoker  . Smokeless tobacco: Never Used  . Alcohol use No  . Drug use: No  . Sexual activity: Not on file   Other Topics Concern  . Not on file   Social History Narrative   Married   Regular Exercise- had not been    hh of 2  3    2  cxats of child    [place] to [facility]    G4 P5    Outpatient Medications Prior to Visit    Medication Sig Dispense Refill  . acyclovir (ZOVIRAX) 400 MG tablet Take 400 mg by mouth as needed.     Marland Kitchen levothyroxine (SYNTHROID, LEVOTHROID) 88 MCG tablet TAKE 1 TABLET EVERY DAY 90 tablet 1  . Multiple Vitamins-Minerals (CENTRUM SILVER PO) Take 1 tablet by mouth daily.    . ranitidine (ZANTAC) 150 MG tablet Take 1 tablet (150 mg total) by mouth at bedtime. Take 1 tablet twice daily. 180 tablet 4  . tamoxifen (NOLVADEX) 20 MG tablet TAKE 1 TABLET EVERY DAY 90 tablet 3  . valACYclovir (VALTREX) 1000 MG tablet Take 2 tablets (2,000 mg total) by mouth 2 (two) times daily as needed. (Patient not taking: Reported on 11/28/2016) 30 tablet 1   No facility-administered medications prior to visit.      EXAM:  BP 100/80 (BP Location: Left Arm, Patient Position: Sitting, Cuff Size: Normal)   Pulse 68   Temp 98 F (36.7 C) (Oral)   Ht 5\' 7"  (1.702 m)   Wt 181 lb 1.6 oz (82.1  kg)   BMI 28.36 kg/m   Body mass index is 28.36 kg/m.  GENERAL: vitals reviewed and listed above, alert, oriented, appears well hydrated and in no acute distress PSYCH: pleasant and cooperative, no obvious depression or anxiety Lab Results  Component Value Date   WBC 4.0 11/21/2016   HGB 12.2 11/21/2016   HCT 36.8 11/21/2016   PLT 218.0 11/21/2016   GLUCOSE 104 09/08/2016   CHOL 175 06/24/2015   TRIG 142.0 06/24/2015   HDL 42.10 06/24/2015   LDLDIRECT 147.2 11/23/2011   LDLCALC 105 (H) 06/24/2015   ALT 17 09/08/2016   AST 18 09/08/2016   NA 141 09/08/2016   K 4.3 09/08/2016   CL 106 06/27/2016   CREATININE 0.8 09/08/2016   BUN 12.4 09/08/2016   CO2 26 09/08/2016   TSH 1.39 06/27/2016   HGBA1C 6.0 06/27/2016   BP Readings from Last 3 Encounters:  11/28/16 100/80  09/20/16 102/68  09/08/16 (!) 104/47   Lab reveiwed   And disc  Wt Readings from Last 3 Encounters:  11/28/16 181 lb 1.6 oz (82.1 kg)  09/20/16 182 lb 6 oz (82.7 kg)  09/08/16 182 lb 6.4 oz (82.7 kg)    ASSESSMENT AND  PLAN:  Discussed the following assessment and plan:  Borderline anemia resolved  - see gi notes  nl iron studies   Gastroesophageal reflux disease without esophagitis  Malignant neoplasm of upper-outer quadrant of right female breast, unspecified estrogen receptor status ([place]) Disc  shingles vaccine  rx given check on reimbursement -Patient advised to return or notify health care team  if  new concerns arise.  Patient Instructions  No anemia  Today .   Fu with [place] if  Vomiting issues persist .   Due for thyroid labs and monitoring  In December.   Check with insurance plan about  Getting shingrix     [name]. [name] M.D.

## 2016-11-28 NOTE — Patient Instructions (Addendum)
No anemia  Today .   Fu with [place] if  Vomiting issues persist .   Due for thyroid labs and monitoring  In December.   Check with insurance plan about  Getting shingrix

## 2017-02-20 ENCOUNTER — Other Ambulatory Visit: Payer: Self-pay | Admitting: Internal Medicine

## 2017-02-20 NOTE — Telephone Encounter (Signed)
Patient needs to be schedule for an annual. Hasn't had one since 2016. Sent in a 30 day supply for thyroid medication. Please help patient schedule and appointment.

## 2017-02-21 NOTE — Telephone Encounter (Signed)
Pt has been sch

## 2017-03-16 ENCOUNTER — Other Ambulatory Visit: Payer: Self-pay | Admitting: General Surgery

## 2017-03-16 DIAGNOSIS — N950 Postmenopausal bleeding: Secondary | ICD-10-CM | POA: Diagnosis not present

## 2017-03-16 DIAGNOSIS — Z853 Personal history of malignant neoplasm of breast: Secondary | ICD-10-CM

## 2017-03-23 ENCOUNTER — Other Ambulatory Visit: Payer: Self-pay | Admitting: Hematology and Oncology

## 2017-03-23 ENCOUNTER — Ambulatory Visit
Admission: RE | Admit: 2017-03-23 | Discharge: 2017-03-23 | Disposition: A | Discharge status: Home / Self Care | Payer: Medicare Other | Source: Ambulatory Visit | Attending: General Surgery | Admitting: General Surgery

## 2017-03-23 DIAGNOSIS — Z853 Personal history of malignant neoplasm of breast: Secondary | ICD-10-CM

## 2017-03-23 DIAGNOSIS — R928 Other abnormal and inconclusive findings on diagnostic imaging of breast: Secondary | ICD-10-CM | POA: Diagnosis not present

## 2017-04-04 ENCOUNTER — Other Ambulatory Visit: Payer: Self-pay | Admitting: Internal Medicine

## 2017-04-13 NOTE — Telephone Encounter (Signed)
Pt only has 7 pills left. Please sent in 90 day supply w/refills

## 2017-05-23 NOTE — Progress Notes (Signed)
Chief Complaint  Patient presents with  . Yearly Visit    Cough x 3 months "off and on", initially productive but is now dry, pt notes some wheezing.     HPI:  Donna Manning 66 y.o. comes in today for yearly  Visit  Concern about cough off and on for months? If alelrgy  Some wheeze at tnighta t times  No so b fever  Still tired ? From tamox?  No bleeding .   Taking thyroid medicine regularly needs refill lab monitoring  Gets cold sores Valtrex have large pills may be more expensive than the acyclovir but needs a refill.  Is gotten the first [place] at a cost.  No chest pain real shortness of breath weight loss other unexpected symptoms.  Health Maintenance  Topic Date Due  . PNA vac Low Risk Adult (1 of 2 - PCV13) 09/30/2014  . INFLUENZA VACCINE  02/15/2017  . MAMMOGRAM  03/24/2019  . COLONOSCOPY  11/16/2020  . TETANUS/TDAP  11/29/2021  . DEXA SCAN  Completed  . Hepatitis C Screening  Completed   Health Maintenance Review LIFESTYLE:  Exercise:   Tobacco/ETS:n Alcohol: ocass Sugar beverages:n Sleep: ok  Drug use: no HH:2 nopets  Helps take care of grandchildre n   Hearing: ok  Vision:  No limitations at present . Last eye check UTD glasses   Safety:  Has smoke detector and wears seat belts.  No firearms. No excess sun exposure. Sees dentist regularly.  Falls:   Advance directive :  Reviewed  Has one.  ADLS:   There are no problems or need for assistance  driving, feeding, obtaining food, dressing, toileting and bathing, managing money using phone. She is independent.    ROS:  Dry spots top of mouth  Hx tmj may be  Coming back ( hx orthodontic rx) GEN/ HEENT: No fever, significant weight changes sweats headaches vision problems hearing changes, CV/ PULM; No chest pain shortness of breath , syncope,edema  change in exercise tolerance. GI /GU: No adominal pain, vomiting, change in bowel habits. No blood in the stool. No significant GU  symptoms. SKIN/HEME: ,no acute skin rashes suspicious lesions or bleeding. No lymphadenopathy, nodules, masses.  NEURO/ PSYCH:  No neurologic signs such as weakness numbness. No depression anxiety. IMM/ Allergy: No unusual infections.  Allergy .  sneexing at times ocass use of  incs  REST of 12 system review negative except as per HPI   Past Medical History:  Diagnosis Date  . Barrett's esophagus 2009   on EGD  . Breast cancer ([place]) 01/2014   right  . Dental crowns present   . GERD (gastroesophageal reflux disease)   . History of DVT (deep vein thrombosis) > 30 years ago   after a pregnancy  . History of gastric ulcer    secondary to ASA use  . Hypothyroidism   . Personal history of radiation therapy 2015  . Radiation 03/31/14-04/25/14   Right Breast    Family History  Problem Relation Age of Onset  . Heart failure Mother 34  . Diabetes Mother   . Parkinsonism Father 66  . Stroke Father   . Thyroid disease Sister   . Prostate cancer Brother   . Stomach cancer Paternal Grandfather     Social History   Socioeconomic History  . Marital status: Married    Spouse name: None  . Number of children: 5  . Years of education: None  . Highest education level: None  Social Needs  . Financial resource strain: None  . Food insecurity - worry: None  . Food insecurity - inability: None  . Transportation needs - medical: None  . Transportation needs - non-medical: None  Occupational History  . Occupation: homemaker  Tobacco Use  . Smoking status: Never Smoker  . Smokeless tobacco: Never Used  Substance and Sexual Activity  . Alcohol use: No  . Drug use: No  . Sexual activity: None  Other Topics Concern  . None  Social History Narrative   Married   Regular Exercise- had not been    hh of 2  3    2  cxats of child    [place] to [facility]    G4 P5    Outpatient Encounter Medications as of 05/24/2017  Medication Sig  . acyclovir (ZOVIRAX) 400 MG tablet Take 1 tablet (400 mg  total) 3 (three) times daily by mouth. As needed  . levothyroxine (SYNTHROID, LEVOTHROID) 88 MCG tablet Take 1 tablet (88 mcg total) daily by mouth.  . Multiple Vitamins-Minerals (CENTRUM SILVER PO) Take 1 tablet by mouth daily.  . ranitidine (ZANTAC) 150 MG tablet Take 1 tablet (150 mg total) by mouth at bedtime. Take 1 tablet twice daily.  . tamoxifen (NOLVADEX) 20 MG tablet TAKE 1 TABLET EVERY DAY  . valACYclovir (VALTREX) 1000 MG tablet Take 2 tablets (2,000 mg total) 2 (two) times daily as needed by mouth.  . [DISCONTINUED] acyclovir (ZOVIRAX) 400 MG tablet Take 400 mg by mouth as needed.   . [DISCONTINUED] levothyroxine (SYNTHROID, LEVOTHROID) 88 MCG tablet TAKE 1 TABLET EVERY DAY  . [DISCONTINUED] valACYclovir (VALTREX) 1000 MG tablet Take 2 tablets (2,000 mg total) by mouth 2 (two) times daily as needed. (Patient not taking: Reported on 11/28/2016)   No facility-administered encounter medications on file as of 05/24/2017.     EXAM:  BP 108/72 (BP Location: Right Arm, Patient Position: Sitting, Cuff Size: Normal)   Pulse 80   Temp 98.2 F (36.8 C) (Oral)   Ht 5\' 7"  (1.702 m)   Wt 179 lb 9.6 oz (81.5 kg)   BMI 28.13 kg/m   Body mass index is 28.13 kg/m.  Physical Exam: Vital signs reviewed NOB:SJGG is a well-developed well-nourished alert cooperative   who appears stated age in no acute distress.  HEENT: normocephalic atraumatic , Eyes: PERRL EOM's full, conjunctiva clear, Nares: paten,t no deformity discharge or tenderness., Ears: no deformity EAC's clear TMs with normal landmarks. Mouth: clear OP, no lesions, edema.  Moist mucous membranes. Dentition in adequate repair. NECK: supple without masses, thyromegaly or bruits. CHEST/PULM:  Clear to auscultation and percussion breath sounds equal no wheeze , rales or rhonchi. Rare wheeze cleared with coughing No chest wall deformities or tenderness. CV: PMI is nondisplaced, S1 S2 no gallops, murmurs, rubs. Peripheral pulses are full  without delay.No JVD . Breast: no massess noted  Axilla clear ABDOMEN: Bowel sounds normal nontender  No guard or rebound, no hepato splenomegal no CVA tenderness.   Extremtities:  No clubbing cyanosis or edema, no acute joint swelling or redness no focal atrophy NEURO:  Oriented x3, cranial nerves 3-12 appear to be intact, no obvious focal weakness,gait within normal limits no abnormal reflexes or asymmetrical SKIN: No acute rashes normal turgor, color, no bruising or petechiae. PSYCH: Oriented, good eye contact, no obvious depression anxiety, cognition and judgment appear normal. LN: no cervical axillary  adenopathy No noted deficits in memory, attention, and speech.   Lab Results  Component Value Date  WBC 4.0 11/21/2016   HGB 12.2 11/21/2016   HCT 36.8 11/21/2016   PLT 218.0 11/21/2016   GLUCOSE 101 (H) 05/24/2017   CHOL 198 05/24/2017   TRIG 147.0 05/24/2017   HDL 48.90 05/24/2017   LDLDIRECT 147.2 11/23/2011   LDLCALC 119 (H) 05/24/2017   ALT 14 05/24/2017   AST 16 05/24/2017   NA 140 05/24/2017   K 4.1 05/24/2017   CL 104 05/24/2017   CREATININE 0.88 05/24/2017   BUN 13 05/24/2017   CO2 29 05/24/2017   TSH 1.16 05/24/2017   HGBA1C 6.1 05/24/2017    ASSESSMENT AND PLAN:  Discussed the following assessment and plan:  Hypothyroidism, unspecified type - Plan: Basic metabolic panel, Hemoglobin A1c, Hepatic function panel, Lipid panel, TSH  Malignant neoplasm of upper-outer quadrant of right breast in female, estrogen receptor positive (HCC)  Cough, persistent - Plan: Basic metabolic panel, Hemoglobin A1c, Hepatic function panel, Lipid panel, TSH, DG Chest 2 View, CANCELED: DG Chest 2 View, CANCELED: DG Chest 2 View  Medication management - Plan: Basic metabolic panel, Hemoglobin A1c, Hepatic function panel, Lipid panel, TSH  Fasting hyperglycemia - Plan: Basic metabolic panel, Hemoglobin A1c, Hepatic function panel, Lipid panel, TSH  Hyperlipidemia, unspecified  hyperlipidemia type - Plan: Basic metabolic panel, Hemoglobin A1c, Hepatic function panel, Lipid panel, TSH  Need for influenza vaccination - Plan: Flu vaccine HIGH DOSE PF (Fluzone High dose)  Need for pneumococcal vaccination - Plan: Pneumococcal conjugate vaccine 13-valent  Hx of cold sores Borderline anemia resolved disc hcm parameters pneumonia vaccine series. If x ray ok then plan  optimize allergy  rx   incs    consdier inhaler if needed  Patient Care Team: Zuly Belkin, [name], MD as PCP - General [name], MD as Attending Physician (Obstetrics and Gynecology) Sharyne Peach, MD (Ophthalmology) Fanny Skates, MD as Consulting Physician (General Surgery) Thea Silversmith, MD (Inactive) as Consulting Physician (Radiation Oncology) Mauri Pole, MD as Consulting Physician (Gastroenterology) Nicholas Lose, MD as Consulting Physician (Hematology and Oncology)  Patient Instructions  Cough could be from allergy.  Get chest x-ray as discussed Use cortisone nose spray every day for suppression of allergy that could trigger your cough at least 2-3 weeks and if helpful maintain. If you are recurring wheezing we can consider adding an inhaler or other evaluation. We will refill your thyroid medicine today.  Prevnar 13 today in 1 year we can give you the Pneumovax which is the second pneumonia vaccine.  Checking blood sugar today.    Cough, Adult Coughing is a reflex that clears your throat and your airways. Coughing helps to heal and protect your lungs. It is normal to cough occasionally, but a cough that happens with other symptoms or lasts a long time may be a sign of a condition that needs treatment. A cough may last only 2-3 weeks (acute), or it may last longer than 8 weeks (chronic). What are the causes? Coughing is commonly caused by:  Breathing in substances that irritate your lungs.  A viral or bacterial respiratory infection.  Allergies.  Asthma.  Postnasal  drip.  Smoking.  Acid backing up from the stomach into the esophagus (gastroesophageal reflux).  Certain medicines.  Chronic lung problems, including COPD (or rarely, lung cancer).  Other medical conditions such as heart failure.  Follow these instructions at home: Pay attention to any changes in your symptoms. Take these actions to help with your discomfort:  Take medicines only as told by your health care provider. ?  If you were prescribed an antibiotic medicine, take it as told by your health care provider. Do not stop taking the antibiotic even if you start to feel better. ? Talk with your health care provider before you take a cough suppressant medicine.  Drink enough fluid to keep your urine clear or pale yellow.  If the air is dry, use a cold steam vaporizer or humidifier in your bedroom or your home to help loosen secretions.  Avoid anything that causes you to cough at work or at home.  If your cough is worse at night, try sleeping in a semi-upright position.  Avoid cigarette smoke. If you smoke, quit smoking. If you need help quitting, ask your health care provider.  Avoid caffeine.  Avoid alcohol.  Rest as needed.  Contact a health care provider if:  You have new symptoms.  You cough up pus.  Your cough does not get better after 2-3 weeks, or your cough gets worse.  You cannot control your cough with suppressant medicines and you are losing sleep.  You develop pain that is getting worse or pain that is not controlled with pain medicines.  You have a fever.  You have unexplained weight loss.  You have night sweats. Get help right away if:  You cough up blood.  You have difficulty breathing.  Your heartbeat is very fast. This information is not intended to replace advice given to you by your health care provider. Make sure you discuss any questions you have with your health care provider. Document Released: 12/31/2010 Document Revised: 12/10/2015  Document Reviewed: 09/10/2014 Elsevier Interactive Patient Education  2017 [employer]. Malesha Suliman M.D.

## 2017-05-24 ENCOUNTER — Ambulatory Visit (INDEPENDENT_AMBULATORY_CARE_PROVIDER_SITE_OTHER): Payer: Medicare Other | Admitting: Internal Medicine

## 2017-05-24 ENCOUNTER — Encounter: Payer: Self-pay | Admitting: Internal Medicine

## 2017-05-24 VITALS — BP 108/72 | HR 80 | Temp 98.2°F | Ht 67.0 in | Wt 179.6 lb

## 2017-05-24 DIAGNOSIS — Z79899 Other long term (current) drug therapy: Secondary | ICD-10-CM

## 2017-05-24 DIAGNOSIS — R7301 Impaired fasting glucose: Secondary | ICD-10-CM

## 2017-05-24 DIAGNOSIS — C50411 Malignant neoplasm of upper-outer quadrant of right female breast: Secondary | ICD-10-CM | POA: Diagnosis not present

## 2017-05-24 DIAGNOSIS — Z170 Estrogen receptor positive status [ER+]: Secondary | ICD-10-CM | POA: Diagnosis not present

## 2017-05-24 DIAGNOSIS — E785 Hyperlipidemia, unspecified: Secondary | ICD-10-CM

## 2017-05-24 DIAGNOSIS — Z23 Encounter for immunization: Secondary | ICD-10-CM | POA: Diagnosis not present

## 2017-05-24 DIAGNOSIS — Z8619 Personal history of other infectious and parasitic diseases: Secondary | ICD-10-CM | POA: Diagnosis not present

## 2017-05-24 DIAGNOSIS — R053 Chronic cough: Secondary | ICD-10-CM

## 2017-05-24 DIAGNOSIS — R05 Cough: Secondary | ICD-10-CM | POA: Diagnosis not present

## 2017-05-24 DIAGNOSIS — E039 Hypothyroidism, unspecified: Secondary | ICD-10-CM | POA: Diagnosis not present

## 2017-05-24 LAB — HEPATIC FUNCTION PANEL
ALBUMIN: 4.2 g/dL (ref 3.5–5.2)
ALK PHOS: 32 U/L — AB (ref 39–117)
ALT: 14 U/L (ref 0–35)
AST: 16 U/L (ref 0–37)
BILIRUBIN DIRECT: 0.1 mg/dL (ref 0.0–0.3)
TOTAL PROTEIN: 6.9 g/dL (ref 6.0–8.3)
Total Bilirubin: 0.6 mg/dL (ref 0.2–1.2)

## 2017-05-24 LAB — BASIC METABOLIC PANEL
BUN: 13 mg/dL (ref 6–23)
CHLORIDE: 104 meq/L (ref 96–112)
CO2: 29 mEq/L (ref 19–32)
Calcium: 9.4 mg/dL (ref 8.4–10.5)
Creatinine, Ser: 0.88 mg/dL (ref 0.40–1.20)
GFR: 67.99 mL/min (ref >60.00)
GLUCOSE: 101 mg/dL — AB (ref 70–99)
POTASSIUM: 4.1 meq/L (ref 3.5–5.1)
Sodium: 140 mEq/L (ref 135–145)

## 2017-05-24 LAB — LIPID PANEL
CHOL/HDL RATIO: 4
Cholesterol: 198 mg/dL (ref 0–200)
HDL: 48.9 mg/dL (ref >39.00)
LDL Cholesterol: 119 mg/dL — ABNORMAL HIGH (ref 0–99)
NONHDL: 148.83
Triglycerides: 147 mg/dL (ref 0.0–149.0)
VLDL: 29.4 mg/dL (ref 0.0–40.0)

## 2017-05-24 LAB — TSH: TSH: 1.16 u[IU]/mL (ref 0.35–4.50)

## 2017-05-24 LAB — HEMOGLOBIN A1C: HEMOGLOBIN A1C: 6.1 % (ref 4.6–6.5)

## 2017-05-24 MED ORDER — VALACYCLOVIR HCL 1 G PO TABS: 2000.0000 mg | ORAL_TABLET | Freq: Two times a day (BID) | ORAL | 1 refills | Status: DC | PRN

## 2017-05-24 MED ORDER — LEVOTHYROXINE SODIUM 88 MCG PO TABS: 88.0000 ug | ORAL_TABLET | Freq: Every day | ORAL | 3 refills | Status: DC

## 2017-05-24 MED ORDER — ACYCLOVIR 400 MG PO TABS: 400.0000 mg | ORAL_TABLET | Freq: Three times a day (TID) | ORAL | 5 refills | Status: DC

## 2017-05-24 NOTE — Patient Instructions (Addendum)
Cough could be from allergy.  Get chest x-ray as discussed Use cortisone nose spray every day for suppression of allergy that could trigger your cough at least 2-3 weeks and if helpful maintain. If you are recurring wheezing we can consider adding an inhaler or other evaluation. We will refill your thyroid medicine today.  Prevnar 13 today in 1 year we can give you the Pneumovax which is the second pneumonia vaccine.  Checking blood sugar today.    Cough, Adult Coughing is a reflex that clears your throat and your airways. Coughing helps to heal and protect your lungs. It is normal to cough occasionally, but a cough that happens with other symptoms or lasts a long time may be a sign of a condition that needs treatment. A cough may last only 2-3 weeks (acute), or it may last longer than 8 weeks (chronic). What are the causes? Coughing is commonly caused by:  Breathing in substances that irritate your lungs.  A viral or bacterial respiratory infection.  Allergies.  Asthma.  Postnasal drip.  Smoking.  Acid backing up from the stomach into the esophagus (gastroesophageal reflux).  Certain medicines.  Chronic lung problems, including COPD (or rarely, lung cancer).  Other medical conditions such as heart failure.  Follow these instructions at home: Pay attention to any changes in your symptoms. Take these actions to help with your discomfort:  Take medicines only as told by your health care provider. ? If you were prescribed an antibiotic medicine, take it as told by your health care provider. Do not stop taking the antibiotic even if you start to feel better. ? Talk with your health care provider before you take a cough suppressant medicine.  Drink enough fluid to keep your urine clear or pale yellow.  If the air is dry, use a cold steam vaporizer or humidifier in your bedroom or your home to help loosen secretions.  Avoid anything that causes you to cough at work or at  home.  If your cough is worse at night, try sleeping in a semi-upright position.  Avoid cigarette smoke. If you smoke, quit smoking. If you need help quitting, ask your health care provider.  Avoid caffeine.  Avoid alcohol.  Rest as needed.  Contact a health care provider if:  You have new symptoms.  You cough up pus.  Your cough does not get better after 2-3 weeks, or your cough gets worse.  You cannot control your cough with suppressant medicines and you are losing sleep.  You develop pain that is getting worse or pain that is not controlled with pain medicines.  You have a fever.  You have unexplained weight loss.  You have night sweats. Get help right away if:  You cough up blood.  You have difficulty breathing.  Your heartbeat is very fast. This information is not intended to replace advice given to you by your health care provider. Make sure you discuss any questions you have with your health care provider. Document Released: 12/31/2010 Document Revised: 12/10/2015 Document Reviewed: 09/10/2014 Elsevier Interactive Patient Education  2017 [employer].

## 2017-05-25 ENCOUNTER — Ambulatory Visit (INDEPENDENT_AMBULATORY_CARE_PROVIDER_SITE_OTHER)
Admission: RE | Admit: 2017-05-25 | Discharge: 2017-05-25 | Disposition: A | Discharge status: Home / Self Care | Payer: Medicare Other | Source: Ambulatory Visit | Attending: Internal Medicine | Admitting: Internal Medicine

## 2017-05-25 DIAGNOSIS — R053 Chronic cough: Secondary | ICD-10-CM

## 2017-05-25 DIAGNOSIS — R05 Cough: Secondary | ICD-10-CM

## 2017-06-01 ENCOUNTER — Telehealth: Payer: Self-pay | Admitting: Internal Medicine

## 2017-06-01 NOTE — Telephone Encounter (Signed)
Pt given xray results. Pt does not want inhalers at this time

## 2017-07-18 HISTORY — PX: BREAST EXCISIONAL BIOPSY: SUR124

## 2017-09-06 ENCOUNTER — Other Ambulatory Visit: Payer: Self-pay | Admitting: Hematology and Oncology

## 2017-09-07 NOTE — Assessment & Plan Note (Deleted)
Right breast invasive ductal carcinoma T2, N0, M0 stage IIA 0.9 cm and a separate nodule 0.25 cm ER/PR positive HER-2 negative, Oncotype DX recurrence score is 14 (9% risk of distant recurrence over 10 years with tamoxifen alone) status post radiation therapy, started antiestrogen therapy with anastrozole in January 2016 years switched to tamoxifen 09/23/2014 (due to osteoporosis)  Tamoxifen toxicities: Endometrial thickening: Patient is following with her gynecologist. The plan is to watch her for 3 months and repeat vaginal ultrasound. She does not have any symptoms of bleeding. She does have a white discharge.  Osteoporosis: DEXA scan November 2015 showed a T score of -2.6 suggestive of osteoporosis. Hence we changed to treatment to tamoxifen. Patient had a lot of bone pain related to oral bisphosphonate therapy. I recommended Prolia but after much thought she decided not to take it. She will need another bone density. We will order that.  Breast Cancer Surveillance: 1. Breast exam 09/07/2017: Normal 2. Mammogram 03/23/17 No abnormalities.   RTC in 1 year

## 2017-09-08 ENCOUNTER — Ambulatory Visit: Payer: Medicare Other | Admitting: Hematology and Oncology

## 2017-09-14 ENCOUNTER — Telehealth: Payer: Self-pay | Admitting: *Deleted

## 2017-09-14 NOTE — Telephone Encounter (Signed)
Call from pt requesting to r/s missed appointment. She thought it was scheduled for March. Scheduling message sent.

## 2017-09-15 ENCOUNTER — Telehealth: Payer: Self-pay | Admitting: Hematology and Oncology

## 2017-09-15 NOTE — Telephone Encounter (Signed)
Spoke to patient regarding upcoming march appointments per 2/28 sch message.  °

## 2017-10-02 ENCOUNTER — Telehealth: Payer: Self-pay | Admitting: Hematology and Oncology

## 2017-10-02 ENCOUNTER — Inpatient Hospital Stay: Payer: Medicare Other | Attending: Hematology and Oncology | Admitting: Hematology and Oncology

## 2017-10-02 DIAGNOSIS — R9389 Abnormal findings on diagnostic imaging of other specified body structures: Secondary | ICD-10-CM | POA: Insufficient documentation

## 2017-10-02 DIAGNOSIS — C50411 Malignant neoplasm of upper-outer quadrant of right female breast: Secondary | ICD-10-CM | POA: Diagnosis not present

## 2017-10-02 DIAGNOSIS — M810 Age-related osteoporosis without current pathological fracture: Secondary | ICD-10-CM

## 2017-10-02 DIAGNOSIS — Z170 Estrogen receptor positive status [ER+]: Secondary | ICD-10-CM | POA: Insufficient documentation

## 2017-10-02 DIAGNOSIS — Z79810 Long term (current) use of selective estrogen receptor modulators (SERMs): Secondary | ICD-10-CM | POA: Diagnosis not present

## 2017-10-02 DIAGNOSIS — Z1589 Genetic susceptibility to other disease: Secondary | ICD-10-CM | POA: Insufficient documentation

## 2017-10-02 MED ORDER — TAMOXIFEN CITRATE 20 MG PO TABS: 20.0000 mg | ORAL_TABLET | Freq: Every day | ORAL | 3 refills | Status: DC

## 2017-10-02 NOTE — Assessment & Plan Note (Signed)
Right breast invasive ductal carcinoma T2, N0, M0 stage IIA 0.9 cm and a separate nodule 0.25 cm ER/PR positive HER-2 negative, Oncotype DX recurrence score is 14 (9% risk of distant recurrence over 10 years with tamoxifen alone) status post radiation therapy, started antiestrogen therapy with anastrozole in January 2016 years switched to tamoxifen 09/23/2014 (due to osteoporosis)  Tamoxifen toxicities: Endometrial thickening: She no longer has a white discharge.  Osteoporosis: DEXA scan November 2015 showed a T score of -2.6 (because of which we switched treatment to tamoxifen): Prolia was offered but patient refused. 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 Breast exam  10/02/2017: Normal 2. Mammogram  03/23/2017 No abnormalities. Postsurgical changes. Breast Density Category B.  Return to clinic in 1 year for follow-up

## 2017-10-02 NOTE — Progress Notes (Signed)
Patient Care Team: [name], [name], MD as PCP - General [name], MD as Attending Physician (Obstetrics and Gynecology) Sharyne Peach, MD (Ophthalmology) Fanny Skates, MD as Consulting Physician (General Surgery) Thea Silversmith, MD (Inactive) as Consulting Physician (Radiation Oncology) Mauri Pole, MD as Consulting Physician (Gastroenterology) Nicholas Lose, MD as Consulting Physician (Hematology and Oncology)  DIAGNOSIS:  Encounter Diagnosis  Name Primary?  . Malignant neoplasm of upper-outer quadrant of right breast in female, estrogen receptor positive ([place])     SUMMARY OF ONCOLOGIC HISTORY:   Breast cancer of upper-outer quadrant of right female breast ([place])   12/24/2013 Initial Diagnosis    Breast cancer of upper-outer quadrant of right female breast: Initial biopsy showed DCIS with calcifications and atypical ductal hyperplasia with suspicion of stromal invasion ER 100% PR 100%      01/20/2014 Surgery    Right breast lumpectomy, IDC grade 1; 3.9 cm and 0.25 cm with DCIS int grade with ALH 3 SLN negative superior margin positive, ER 100% PR 100% HER-2 negative ratio 1.1 Ki-67 3% Oncotype 14 low risk       02/05/2014 Surgery    Reexcision of the superior margin no residual cancer      04/17/2014 - 05/16/2014 Radiation Therapy    Adjuvant radiation therapy      07/25/2014 -  Anti-estrogen oral therapy    Anastrozole 1 mg daily switched to tamoxifen 20 mg daily from 09/23/2014 due to osteoporosis       CHIEF COMPLIANT: Follow-up on tamoxifen therapy  INTERVAL HISTORY: Donna Manning is a 67 year old with above-mentioned history of right breast cancer treated with lumpectomy followed by radiation is currently on oral antiestrogen therapy since January 2016.  She completed 3 years of this therapy.  Lately she has been on tamoxifen.  She does not report any hot flashes or myalgias.  She denies any lumps or nodules in the breast.  REVIEW OF SYSTEMS:     Constitutional: Denies fevers, chills or abnormal weight loss Eyes: Denies blurriness of vision Ears, nose, mouth, throat, and face: Denies mucositis or sore throat Respiratory: Denies cough, dyspnea or wheezes Cardiovascular: Denies palpitation, chest discomfort Gastrointestinal:  Denies nausea, heartburn or change in bowel habits Skin: Denies abnormal skin rashes Lymphatics: Denies new lymphadenopathy or easy bruising Neurological:Denies numbness, tingling or new weaknesses Behavioral/Psych: Mood is stable, no new changes  Extremities: No lower extremity edema Breast:  denies any pain or lumps or nodules in either breasts All other systems were reviewed with the patient and are negative.  I have reviewed the past medical history, past surgical history, social history and family history with the patient and they are unchanged from previous note.  ALLERGIES:  is allergic to actonel [risedronate sodium]; contrast media [iodinated diagnostic agents]; and starch.  MEDICATIONS:  Current Outpatient Medications  Medication Sig Dispense Refill  . acyclovir (ZOVIRAX) 400 MG tablet Take 1 tablet (400 mg total) 3 (three) times daily by mouth. As needed 30 tablet 5  . levothyroxine (SYNTHROID, LEVOTHROID) 88 MCG tablet Take 1 tablet (88 mcg total) daily by mouth. 90 tablet 3  . Multiple Vitamins-Minerals (CENTRUM SILVER PO) Take 1 tablet by mouth daily.    . ranitidine (ZANTAC) 150 MG tablet Take 1 tablet (150 mg total) by mouth at bedtime. Take 1 tablet twice daily. 180 tablet 4  . tamoxifen (NOLVADEX) 20 MG tablet TAKE 1 TABLET EVERY DAY 90 tablet 3  . valACYclovir (VALTREX) 1000 MG tablet Take 2 tablets (2,000 mg total)  2 (two) times daily as needed by mouth. 30 tablet 1   No current facility-administered medications for this visit.     PHYSICAL EXAMINATION: ECOG PERFORMANCE STATUS: 1 - Symptomatic but completely ambulatory  Vitals:   10/02/17 1127  BP: (!) 113/57  Pulse: 89  Temp:  98.2 F (36.8 C)  SpO2: 98%   Filed Weights   10/02/17 1127  Weight: 180 lb 11.2 oz (82 kg)    GENERAL:alert, no distress and comfortable SKIN: skin color, texture, turgor are normal, no rashes or significant lesions EYES: normal, Conjunctiva are pink and non-injected, sclera clear OROPHARYNX:no exudate, no erythema and lips, buccal mucosa, and tongue normal  NECK: supple, thyroid normal size, non-tender, without nodularity LYMPH:  no palpable lymphadenopathy in the cervical, axillary or inguinal LUNGS: clear to auscultation and percussion with normal breathing effort HEART: regular rate & rhythm and no murmurs and no lower extremity edema ABDOMEN:abdomen soft, non-tender and normal bowel sounds MUSCULOSKELETAL:no cyanosis of digits and no clubbing  NEURO: alert & oriented x 3 with fluent speech, no focal motor/sensory deficits EXTREMITIES: No lower extremity edema BREAST: No palpable masses or nodules in either right or left breasts. No palpable axillary supraclavicular or infraclavicular adenopathy no breast tenderness or nipple discharge. (exam performed in the presence of a chaperone)  LABORATORY DATA:  I have reviewed the data as listed CMP Latest Ref Rng & Units 05/24/2017 09/08/2016 06/27/2016  Glucose 70 - 99 mg/dL 101(H) 104 106(H)  BUN 6 - 23 mg/dL 13 12.4 11  Creatinine 0.40 - 1.20 mg/dL 0.88 0.8 0.86  Sodium 135 - 145 mEq/L 140 141 140  Potassium 3.5 - 5.1 mEq/L 4.1 4.3 4.1  Chloride 96 - 112 mEq/L 104 - 106  CO2 19 - 32 mEq/L _0 Calcium 8.4 - 10.5 mg/dL 9.4 9.1 8.8  Total Protein 6.0 - 8.3 g/dL 6.9 6.6 6.3  Total Bilirubin 0.2 - 1.2 mg/dL 0.6 0.43 0.5  Alkaline Phos 39 - 117 U/L 32(L) 35(L) 32(L)  AST 0 - 37 U/L _1 ALT 0 - 35 U/L _2 Lab Results  Component Value Date   WBC 4.0 11/21/2016   HGB 12.2 11/21/2016   HCT 36.8 11/21/2016   MCV 93.0 11/21/2016   PLT 218.0 11/21/2016   NEUTROABS 1.9 11/21/2016    ASSESSMENT & PLAN:  Breast  cancer of upper-outer quadrant of right female breast Right breast invasive ductal carcinoma T2, N0, M0 stage IIA 0.9 cm and a separate nodule 0.25 cm ER/PR positive HER-2 negative, Oncotype DX recurrence score is 14 (9% risk of distant recurrence over 10 years with tamoxifen alone) status post radiation therapy, started antiestrogen therapy with anastrozole in January 2016 years switched to tamoxifen 09/23/2014 (due to osteoporosis)  Tamoxifen toxicities: Denies any hot flashes or myalgias Endometrial thickening: She no longer has a white discharge.  Osteoporosis: DEXA scan November 2015 showed a T score of -2.6 (because of which we switched treatment to tamoxifen): Prolia was offered but patient refused. 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 Breast exam  10/02/2017: Normal 2. Mammogram  03/23/2017 No abnormalities. Postsurgical changes. Breast Density Category B.  Return to clinic in 1 year for follow-up   I spent 25 minutes talking to the patient of which more than half was spent in counseling and coordination of care.  No orders of the defined types were  placed in this encounter.  The patient has a good understanding of the overall plan. she agrees with it. she will call with any problems that may develop before the next visit here.   Harriette Ohara, MD 10/02/17

## 2017-10-02 NOTE — Telephone Encounter (Signed)
Gave avs and calendar ° °

## 2018-01-24 ENCOUNTER — Telehealth: Payer: Self-pay | Admitting: Internal Medicine

## 2018-01-24 DIAGNOSIS — E039 Hypothyroidism, unspecified: Secondary | ICD-10-CM

## 2018-01-24 DIAGNOSIS — R7301 Impaired fasting glucose: Secondary | ICD-10-CM

## 2018-01-24 DIAGNOSIS — R250 Abnormal head movements: Secondary | ICD-10-CM

## 2018-01-24 DIAGNOSIS — Z79899 Other long term (current) drug therapy: Secondary | ICD-10-CM

## 2018-01-24 NOTE — Telephone Encounter (Signed)
Pt is requesting a referral (aware that she is most likely going to need an OV to eval) Having twitching/shaking in her head - subtle rocking side to side motion Pt states her husband and multiple family members point out that they see her head twitching/shaking.  Pt states that she can tell occasionally that she is having shaking - not constant.  Pt states that this is worsening, becoming more frequent, not increasing in intensity.  Pt notices some when she is laying down to sleep  Pt states that it was first brought to her attention 2 years ago and it seemed to calm down and become less noticeable until now.  Please advise Dr Regis Bill, thanks.  Pt is leaving to go out of town on Friday 01/26/18 for two weeks.

## 2018-01-24 NOTE — Telephone Encounter (Signed)
Copied from [place] [id number]. Topic: Referral - Request >> Jan 24, 2018  8:09 AM Alfredia Ferguson R wrote: Reason for CRM: Pt is calling wanting a referral due to twitching in head which is pretty constant  Callback # [id number]

## 2018-01-25 NOTE — Telephone Encounter (Signed)
We can do a a referral to neurology but  if she  hasn't been  Evaluated before  By  a provider   for the problem  They may require   Ov for referral   Thus  Advise  OV after she returns from her trip     And can put in  Referral to neurology int he interim .

## 2018-01-30 NOTE — Telephone Encounter (Signed)
I spoke with pt and gave advice, also put in the referral.

## 2018-01-31 NOTE — Telephone Encounter (Signed)
Lab orders place  To be done pre yearly check in november

## 2018-01-31 NOTE — Telephone Encounter (Addendum)
Neurology is requiring an OV with eval of current issue prior to being seen by their office.  Pt states that this issue is not impacting her life or daily routine and she just wants to wait until her OV this Fall with Dr Regis Bill. Pt is scheduled for CPE on 06/04/18 @10am .   Referral cancelled at this time.  Pt is requesting that her labs be checked prior to the appt so that she can have these discussed during the [place].  Pt states that she never received her results from 05/2017 -- I reviewed the chart and these do not look to have been resulted?? I gave results to patient and reviewed numbers with her regarding Glucose, TSH and Cholesterol. Apologized for the inconvenience. Advised patient in the future that if there is ever a 1-2 week delay, or longer, in getting her results to please dont hesitate to call and ask.   Will send to Dr Regis Bill to advise. Thanks.

## 2018-02-05 NOTE — Telephone Encounter (Signed)
I left a voice message for pt, the lab orders are in our system, she will just need to call the office back and schedule the lab appointment.

## 2018-03-06 ENCOUNTER — Other Ambulatory Visit: Payer: Self-pay | Admitting: Hematology and Oncology

## 2018-03-06 ENCOUNTER — Other Ambulatory Visit: Payer: Self-pay | Admitting: Gastroenterology

## 2018-03-06 DIAGNOSIS — Z9889 Other specified postprocedural states: Secondary | ICD-10-CM

## 2018-03-06 DIAGNOSIS — Z853 Personal history of malignant neoplasm of breast: Secondary | ICD-10-CM

## 2018-03-18 DIAGNOSIS — N6092 Unspecified benign mammary dysplasia of left breast: Secondary | ICD-10-CM

## 2018-03-18 HISTORY — DX: Unspecified benign mammary dysplasia of left breast: N60.92

## 2018-03-27 ENCOUNTER — Ambulatory Visit
Admission: RE | Admit: 2018-03-27 | Discharge: 2018-03-27 | Disposition: A | Discharge status: Home / Self Care | Payer: Medicare Other | Source: Ambulatory Visit | Attending: Hematology and Oncology | Admitting: Hematology and Oncology

## 2018-03-27 ENCOUNTER — Other Ambulatory Visit: Payer: Self-pay | Admitting: Hematology and Oncology

## 2018-03-27 DIAGNOSIS — Z9889 Other specified postprocedural states: Secondary | ICD-10-CM

## 2018-03-27 DIAGNOSIS — N651 Disproportion of reconstructed breast: Secondary | ICD-10-CM | POA: Diagnosis not present

## 2018-03-27 DIAGNOSIS — R928 Other abnormal and inconclusive findings on diagnostic imaging of breast: Secondary | ICD-10-CM

## 2018-03-27 DIAGNOSIS — Z853 Personal history of malignant neoplasm of breast: Secondary | ICD-10-CM

## 2018-03-27 DIAGNOSIS — N6489 Other specified disorders of breast: Secondary | ICD-10-CM

## 2018-03-27 DIAGNOSIS — R922 Inconclusive mammogram: Secondary | ICD-10-CM | POA: Diagnosis not present

## 2018-03-28 ENCOUNTER — Ambulatory Visit
Admission: RE | Admit: 2018-03-28 | Discharge: 2018-03-28 | Disposition: A | Discharge status: Home / Self Care | Payer: Medicare Other | Source: Ambulatory Visit | Attending: Hematology and Oncology | Admitting: Hematology and Oncology

## 2018-03-28 ENCOUNTER — Other Ambulatory Visit: Payer: Self-pay | Admitting: Hematology and Oncology

## 2018-03-28 DIAGNOSIS — N62 Hypertrophy of breast: Secondary | ICD-10-CM | POA: Diagnosis not present

## 2018-03-28 DIAGNOSIS — N6489 Other specified disorders of breast: Secondary | ICD-10-CM

## 2018-03-28 DIAGNOSIS — N6323 Unspecified lump in the left breast, lower outer quadrant: Secondary | ICD-10-CM | POA: Diagnosis not present

## 2018-04-03 DIAGNOSIS — R300 Dysuria: Secondary | ICD-10-CM | POA: Diagnosis not present

## 2018-04-03 DIAGNOSIS — N898 Other specified noninflammatory disorders of vagina: Secondary | ICD-10-CM | POA: Diagnosis not present

## 2018-04-05 ENCOUNTER — Telehealth: Payer: Self-pay

## 2018-04-06 ENCOUNTER — Other Ambulatory Visit: Payer: Self-pay

## 2018-04-06 DIAGNOSIS — K219 Gastro-esophageal reflux disease without esophagitis: Secondary | ICD-10-CM

## 2018-04-06 MED ORDER — FAMOTIDINE 20 MG PO TABS: 20.0000 mg | ORAL_TABLET | Freq: Two times a day (BID) | ORAL | 6 refills | Status: DC

## 2018-04-06 NOTE — Telephone Encounter (Signed)
Agree, please send Rx for pepcid

## 2018-04-06 NOTE — Telephone Encounter (Signed)
Wants to change to Pepcid. Okay to give her a prescription?

## 2018-04-06 NOTE — Telephone Encounter (Signed)
Patient thanks you for the change.

## 2018-04-09 ENCOUNTER — Ambulatory Visit: Payer: Self-pay | Admitting: Surgery

## 2018-04-09 DIAGNOSIS — N6092 Unspecified benign mammary dysplasia of left breast: Secondary | ICD-10-CM

## 2018-04-09 NOTE — H&P (View-Only) (Signed)
Donna Manning Documented: 04/09/2018 11:13 AM Location: [facility] Patient #: [id number] DOB: [birth year] Married / Language: Cleophus Molt / Race: White Female  History of Present Illness (Marcello Moores A. Stephanieann Popescu MD; 04/09/2018 11:50 AM) Patient words: Patient sent a request Dr. Owens Shark due to abnormal screening mammogram. The patient has a history of stage II right breast cancer treated in 2015 with breast conservation and radiation therapy. She has been maintained on tamoxifen. She is followed by oncology. She went for a diagnostic mammogram this year which showed distortion left breast central quadrant. Core biopsy showed atypical lobular hyperplasia. She denies any breast pain, nipple discharge or breast mass. She does relate a right breast distortion surgery and radiation therapy.  The patient is a 67 year old female.   Past Surgical History (April Staton, CMA; 04/09/2018 11:13 AM) Breast Mass; Local Excision Right. Oral Surgery  Diagnostic Studies History (April Staton, [place]; 04/09/2018 11:13 AM) Colonoscopy >10 years ago 1-5 years ago Mammogram within last year Pap Smear 1-5 years ago  Allergies (April Staton, CMA; 04/09/2018 11:14 AM) Actonel *ENDOCRINE AND METABOLIC AGENTS - MISC.* Contrast Media Ready-Box *MEDICAL DEVICES* Starch Control *Assorted Classes**  Medication History (April Staton, CMA; 04/09/2018 11:14 AM) Tamoxifen Citrate (20MG  Tablet, Oral) Active. Sulfamethoxazole-Trimethoprim (800-160MG  Tablet, Oral) Active. Synthroid (100MCG Tablet, Oral) Active. (once weekly) Claritin (10MG  Tablet, Oral daily) Active. PriLOSEC (20MG  Capsule DR, Oral daily) Active. Medications Reconciled  Social History (April Staton, CMA; 04/09/2018 11:14 AM) Caffeine use Carbonated beverages. No alcohol use No drug use Tobacco use Never smoker.  Family History (April Staton, [place]; 04/09/2018 11:13 AM) Alcohol Abuse Sister. Arthritis Brother, Father. Bleeding  disorder Mother. Cerebrovascular Accident Father. Diabetes Mellitus Mother. Prostate Cancer Brother.  Pregnancy / Birth History (April Staton, [place]; 04/09/2018 11:14 AM) Age of menopause 45-55 Gravida 4 Length (months) of breastfeeding >55 Maternal age >72 34-25 Para 5  Other Problems (April Staton, CMA; 04/09/2018 11:13 AM) Anxiety Disorder Back Pain Bladder Problems Breast Cancer Emphysema Of Lung Gastric Ulcer Gastroesophageal Reflux Disease Hemorrhoids Lump In Breast Myocardial infarction Thyroid Disease     Review of Systems (April Staton CMA; 04/09/2018 11:14 AM) General Present- Fatigue. Not Present- Appetite Loss, Chills, Fever, Night Sweats, Weight Gain and Weight Loss. Skin Present- Dryness. Not Present- Change in Wart/Mole, Hives, Jaundice, New Lesions, Non-Healing Wounds, Rash and Ulcer. HEENT Present- Seasonal Allergies and Wears glasses/contact lenses. Not Present- Earache, Hearing Loss, Hoarseness, Nose Bleed, Oral Ulcers, Ringing in the Ears, Sinus Pain, Sore Throat, Visual Disturbances and Yellow Eyes. Breast Present- Breast Pain. Not Present- Breast Mass, Nipple Discharge and Skin Changes. Cardiovascular Not Present- Chest Pain, Difficulty Breathing Lying Down, Leg Cramps, Palpitations, Rapid Heart Rate, Shortness of Breath and Swelling of Extremities. Gastrointestinal Present- Gets full quickly at meals and Hemorrhoids. Not Present- Abdominal Pain, Bloating, Bloody Stool, Change in Bowel Habits, Chronic diarrhea, Constipation, Difficulty Swallowing, Excessive gas, Indigestion, Nausea, Rectal Pain and Vomiting. Female Genitourinary Present- Painful Urination. Not Present- Frequency, Nocturia, Pelvic Pain and Urgency. Musculoskeletal Not Present- Back Pain, Joint Pain, Joint Stiffness, Muscle Pain, Muscle Weakness and Swelling of Extremities. Psychiatric Not Present- Anxiety, Bipolar, Change in Sleep Pattern, Depression, Fearful and Frequent  crying. Endocrine Not Present- Cold Intolerance, Excessive Hunger, Hair Changes, Heat Intolerance, Hot flashes and New Diabetes. Hematology Not Present- Blood Thinners, Easy Bruising, Excessive bleeding, Gland problems, HIV and Persistent Infections.  Vitals (April Staton CMA; 04/09/2018 11:15 AM) 04/09/2018 11:14 AM Weight: 184.13 lb Height: 67in Body Surface Area: 1.95 m Body Mass Index: 28.84 kg/m  Temp.: 98.16F(Oral)  Pulse: 77 (Regular)  BP: 120/80 (Sitting, Left Arm, Standard)      Physical Exam (Zakiyah Diop A. Atiyah Bauer MD; 04/09/2018 11:51 AM)  General Mental Status-Alert. General Appearance-Consistent with stated age. Hydration-Well hydrated. Voice-Normal.  Chest and Lung Exam Chest and lung exam reveals -quiet, even and easy respiratory effort with no use of accessory muscles and on auscultation, normal breath sounds, no adventitious sounds and normal vocal resonance. Inspection Chest Wall - Normal. Back - normal.  Breast Note: Scarring right breast noted. Posterior vertical changes noted. No masses. No nipple discharge. Left breast is normal.  Cardiovascular Cardiovascular examination reveals -normal heart sounds, regular rate and rhythm with no murmurs and normal pedal pulses bilaterally.  Musculoskeletal Normal Exam - Left-Upper Extremity Strength Normal and Lower Extremity Strength Normal. Normal Exam - Right-Upper Extremity Strength Normal and Lower Extremity Strength Normal.  Lymphatic Head & Neck  General Head & Neck Lymphatics: Bilateral - Description - Normal. Axillary  General Axillary Region: Bilateral - Description - Normal. Tenderness - Non Tender.    Assessment & Plan (Bryttany Tortorelli A. 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 There is roughly a 5% of upgrade risk with these lesions. Surgical excision is  controversial. I reviewed literature with her and recommendations are nonoperative management will offered. She has chosen left breast lumpectomy though given her previous history. Risk of lumpectomy include bleeding, infection, seroma, more surgery, use of seed/wire, wound care, cosmetic deformity and the need for other treatments, death , blood clots, death. Pt agrees to proceed. core biopsy. I offered nonoperative management and follow-up. The patient desires left breast lumpectomy though given her past history. She has an iodine allergy and desires wire localization.   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 Pt Education - [facility] Breast Biopsy HCI: discussed with patient and provided information.

## 2018-04-09 NOTE — H&P (Signed)
Donna Manning Documented: 04/09/2018 11:13 AM Location: [facility] Patient #: [id number] DOB: 1951-05-20 Married / Language: Cleophus Molt / Race: White Female  History of Present Illness (Marcello Moores A. Zephyr Ridley MD; 04/09/2018 11:50 AM) Patient words: Patient sent a request Dr. Owens Shark due to abnormal screening mammogram. The patient has a history of stage II right breast cancer treated in 2015 with breast conservation and radiation therapy. She has been maintained on tamoxifen. She is followed by oncology. She went for a diagnostic mammogram this year which showed distortion left breast central quadrant. Core biopsy showed atypical lobular hyperplasia. She denies any breast pain, nipple discharge or breast mass. She does relate a right breast distortion surgery and radiation therapy.  The patient is a 67 year old female.   Past Surgical History (April Staton, CMA; 04/09/2018 11:13 AM) Breast Mass; Local Excision Right. Oral Surgery  Diagnostic Studies History (April Staton, [place]; 04/09/2018 11:13 AM) Colonoscopy >10 years ago 1-5 years ago Mammogram within last year Pap Smear 1-5 years ago  Allergies (April Staton, CMA; 04/09/2018 11:14 AM) Actonel *ENDOCRINE AND METABOLIC AGENTS - MISC.* Contrast Media Ready-Box *MEDICAL DEVICES* Starch Control *Assorted Classes**  Medication History (April Staton, CMA; 04/09/2018 11:14 AM) Tamoxifen Citrate (20MG  Tablet, Oral) Active. Sulfamethoxazole-Trimethoprim (800-160MG  Tablet, Oral) Active. Synthroid (100MCG Tablet, Oral) Active. (once weekly) Claritin (10MG  Tablet, Oral daily) Active. PriLOSEC (20MG  Capsule DR, Oral daily) Active. Medications Reconciled  Social History (April Staton, CMA; 04/09/2018 11:14 AM) Caffeine use Carbonated beverages. No alcohol use No drug use Tobacco use Never smoker.  Family History (April Staton, [place]; 04/09/2018 11:13 AM) Alcohol Abuse Sister. Arthritis Brother, Father. Bleeding  disorder Mother. Cerebrovascular Accident Father. Diabetes Mellitus Mother. Prostate Cancer Brother.  Pregnancy / Birth History (April Staton, [place]; 04/09/2018 11:14 AM) Age of menopause 14-55 Gravida 4 Length (months) of breastfeeding >74 Maternal age >40 23-25 Para 5  Other Problems (April Staton, CMA; 04/09/2018 11:13 AM) Anxiety Disorder Back Pain Bladder Problems Breast Cancer Emphysema Of Lung Gastric Ulcer Gastroesophageal Reflux Disease Hemorrhoids Lump In Breast Myocardial infarction Thyroid Disease     Review of Systems (April Staton CMA; 04/09/2018 11:14 AM) General Present- Fatigue. Not Present- Appetite Loss, Chills, Fever, Night Sweats, Weight Gain and Weight Loss. Skin Present- Dryness. Not Present- Change in Wart/Mole, Hives, Jaundice, New Lesions, Non-Healing Wounds, Rash and Ulcer. HEENT Present- Seasonal Allergies and Wears glasses/contact lenses. Not Present- Earache, Hearing Loss, Hoarseness, Nose Bleed, Oral Ulcers, Ringing in the Ears, Sinus Pain, Sore Throat, Visual Disturbances and Yellow Eyes. Breast Present- Breast Pain. Not Present- Breast Mass, Nipple Discharge and Skin Changes. Cardiovascular Not Present- Chest Pain, Difficulty Breathing Lying Down, Leg Cramps, Palpitations, Rapid Heart Rate, Shortness of Breath and Swelling of Extremities. Gastrointestinal Present- Gets full quickly at meals and Hemorrhoids. Not Present- Abdominal Pain, Bloating, Bloody Stool, Change in Bowel Habits, Chronic diarrhea, Constipation, Difficulty Swallowing, Excessive gas, Indigestion, Nausea, Rectal Pain and Vomiting. Female Genitourinary Present- Painful Urination. Not Present- Frequency, Nocturia, Pelvic Pain and Urgency. Musculoskeletal Not Present- Back Pain, Joint Pain, Joint Stiffness, Muscle Pain, Muscle Weakness and Swelling of Extremities. Psychiatric Not Present- Anxiety, Bipolar, Change in Sleep Pattern, Depression, Fearful and Frequent  crying. Endocrine Not Present- Cold Intolerance, Excessive Hunger, Hair Changes, Heat Intolerance, Hot flashes and New Diabetes. Hematology Not Present- Blood Thinners, Easy Bruising, Excessive bleeding, Gland problems, HIV and Persistent Infections.  Vitals (April Staton CMA; 04/09/2018 11:15 AM) 04/09/2018 11:14 AM Weight: 184.13 lb Height: 67in Body Surface Area: 1.95 m Body Mass Index: 28.84 kg/m  Temp.: 98.27F(Oral)  Pulse: 77 (Regular)  BP: 120/80 (Sitting, Left Arm, Standard)      Physical Exam (Willard Farquharson A. Bemnet Trovato MD; 04/09/2018 11:51 AM)  General Mental Status-Alert. General Appearance-Consistent with stated age. Hydration-Well hydrated. Voice-Normal.  Chest and Lung Exam Chest and lung exam reveals -quiet, even and easy respiratory effort with no use of accessory muscles and on auscultation, normal breath sounds, no adventitious sounds and normal vocal resonance. Inspection Chest Wall - Normal. Back - normal.  Breast Note: Scarring right breast noted. Posterior vertical changes noted. No masses. No nipple discharge. Left breast is normal.  Cardiovascular Cardiovascular examination reveals -normal heart sounds, regular rate and rhythm with no murmurs and normal pedal pulses bilaterally.  Musculoskeletal Normal Exam - Left-Upper Extremity Strength Normal and Lower Extremity Strength Normal. Normal Exam - Right-Upper Extremity Strength Normal and Lower Extremity Strength Normal.  Lymphatic Head & Neck  General Head & Neck Lymphatics: Bilateral - Description - Normal. Axillary  General Axillary Region: Bilateral - Description - Normal. Tenderness - Non Tender.    Assessment & Plan (Tanith Dagostino A. 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 There is roughly a 5% of upgrade risk with these lesions. Surgical excision is  controversial. I reviewed literature with her and recommendations are nonoperative management will offered. She has chosen left breast lumpectomy though given her previous history. Risk of lumpectomy include bleeding, infection, seroma, more surgery, use of seed/wire, wound care, cosmetic deformity and the need for other treatments, death , blood clots, death. Pt agrees to proceed. core biopsy. I offered nonoperative management and follow-up. The patient desires left breast lumpectomy though given her past history. She has an iodine allergy and desires wire localization.   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 Pt Education - [facility] Breast Biopsy HCI: discussed with patient and provided information.

## 2018-04-11 ENCOUNTER — Other Ambulatory Visit: Payer: Self-pay | Admitting: Surgery

## 2018-04-11 DIAGNOSIS — N6092 Unspecified benign mammary dysplasia of left breast: Secondary | ICD-10-CM

## 2018-04-12 ENCOUNTER — Ambulatory Visit
Admission: RE | Admit: 2018-04-12 | Discharge: 2018-04-12 | Disposition: A | Discharge status: Home / Self Care | Payer: Medicare Other | Source: Ambulatory Visit | Attending: Surgery | Admitting: Surgery

## 2018-04-12 DIAGNOSIS — N6092 Unspecified benign mammary dysplasia of left breast: Secondary | ICD-10-CM

## 2018-04-17 ENCOUNTER — Encounter (HOSPITAL_BASED_OUTPATIENT_CLINIC_OR_DEPARTMENT_OTHER): Payer: Self-pay | Admitting: *Deleted

## 2018-04-17 ENCOUNTER — Other Ambulatory Visit: Payer: Self-pay

## 2018-04-17 NOTE — Pre-Procedure Instructions (Signed)
To come pick up Ensure pre-surgery drink 10 oz. - to drink by 0945 DOS.

## 2018-04-20 NOTE — Progress Notes (Signed)
Ensure pre surgery drink given with instructions to complete by 0945 dos, pt verbalized understanding. 

## 2018-04-23 DIAGNOSIS — R3915 Urgency of urination: Secondary | ICD-10-CM | POA: Diagnosis not present

## 2018-04-23 DIAGNOSIS — Z01419 Encounter for gynecological examination (general) (routine) without abnormal findings: Secondary | ICD-10-CM | POA: Diagnosis not present

## 2018-04-23 DIAGNOSIS — Z124 Encounter for screening for malignant neoplasm of cervix: Secondary | ICD-10-CM | POA: Diagnosis not present

## 2018-04-24 ENCOUNTER — Other Ambulatory Visit: Payer: Self-pay

## 2018-04-24 ENCOUNTER — Encounter (HOSPITAL_COMMUNITY)
Admission: RE | Disposition: A | Discharge status: Home / Self Care | Payer: Self-pay | Source: Ambulatory Visit | Attending: Surgery

## 2018-04-24 ENCOUNTER — Ambulatory Visit (HOSPITAL_BASED_OUTPATIENT_CLINIC_OR_DEPARTMENT_OTHER): Payer: Medicare Other | Admitting: Anesthesiology

## 2018-04-24 ENCOUNTER — Encounter (HOSPITAL_BASED_OUTPATIENT_CLINIC_OR_DEPARTMENT_OTHER): Payer: Self-pay | Admitting: *Deleted

## 2018-04-24 ENCOUNTER — Ambulatory Visit (HOSPITAL_COMMUNITY): Payer: Medicare Other

## 2018-04-24 ENCOUNTER — Ambulatory Visit
Admission: RE | Admit: 2018-04-24 | Discharge: 2018-04-24 | Disposition: A | Discharge status: Home / Self Care | Payer: Medicare Other | Source: Ambulatory Visit | Attending: Surgery | Admitting: Surgery

## 2018-04-24 ENCOUNTER — Inpatient Hospital Stay (HOSPITAL_BASED_OUTPATIENT_CLINIC_OR_DEPARTMENT_OTHER)
Admission: RE | Admit: 2018-04-24 | Discharge: 2018-04-25 | DRG: 584 | Disposition: A | Discharge status: Home / Self Care | Payer: Medicare Other | Source: Ambulatory Visit | Attending: Surgery | Admitting: Surgery

## 2018-04-24 DIAGNOSIS — K219 Gastro-esophageal reflux disease without esophagitis: Secondary | ICD-10-CM | POA: Diagnosis present

## 2018-04-24 DIAGNOSIS — Z853 Personal history of malignant neoplasm of breast: Secondary | ICD-10-CM | POA: Diagnosis not present

## 2018-04-24 DIAGNOSIS — E039 Hypothyroidism, unspecified: Secondary | ICD-10-CM | POA: Diagnosis present

## 2018-04-24 DIAGNOSIS — K2270 Barrett's esophagus without dysplasia: Secondary | ICD-10-CM | POA: Diagnosis not present

## 2018-04-24 DIAGNOSIS — Z923 Personal history of irradiation: Secondary | ICD-10-CM

## 2018-04-24 DIAGNOSIS — T380X5A Adverse effect of glucocorticoids and synthetic analogues, initial encounter: Secondary | ICD-10-CM | POA: Diagnosis not present

## 2018-04-24 DIAGNOSIS — Z86718 Personal history of other venous thrombosis and embolism: Secondary | ICD-10-CM | POA: Diagnosis not present

## 2018-04-24 DIAGNOSIS — Z8711 Personal history of peptic ulcer disease: Secondary | ICD-10-CM | POA: Diagnosis not present

## 2018-04-24 DIAGNOSIS — Y703 Surgical instruments, materials and anesthesiology devices (including sutures) associated with adverse incidents: Secondary | ICD-10-CM | POA: Diagnosis not present

## 2018-04-24 DIAGNOSIS — T17818A Gastric contents in other parts of respiratory tract causing other injury, initial encounter: Secondary | ICD-10-CM | POA: Diagnosis not present

## 2018-04-24 DIAGNOSIS — N6012 Diffuse cystic mastopathy of left breast: Secondary | ICD-10-CM | POA: Diagnosis not present

## 2018-04-24 DIAGNOSIS — Y838 Other surgical procedures as the cause of abnormal reaction of the patient, or of later complication, without mention of misadventure at the time of the procedure: Secondary | ICD-10-CM | POA: Diagnosis not present

## 2018-04-24 DIAGNOSIS — J9588 Other intraoperative complications of respiratory system, not elsewhere classified: Secondary | ICD-10-CM | POA: Diagnosis not present

## 2018-04-24 DIAGNOSIS — R928 Other abnormal and inconclusive findings on diagnostic imaging of breast: Secondary | ICD-10-CM | POA: Diagnosis not present

## 2018-04-24 DIAGNOSIS — T17908A Unspecified foreign body in respiratory tract, part unspecified causing other injury, initial encounter: Secondary | ICD-10-CM

## 2018-04-24 DIAGNOSIS — N6092 Unspecified benign mammary dysplasia of left breast: Secondary | ICD-10-CM

## 2018-04-24 DIAGNOSIS — C50911 Malignant neoplasm of unspecified site of right female breast: Secondary | ICD-10-CM | POA: Diagnosis not present

## 2018-04-24 DIAGNOSIS — Z8249 Family history of ischemic heart disease and other diseases of the circulatory system: Secondary | ICD-10-CM

## 2018-04-24 DIAGNOSIS — R05 Cough: Secondary | ICD-10-CM | POA: Diagnosis not present

## 2018-04-24 DIAGNOSIS — Y92234 Operating room of hospital as the place of occurrence of the external cause: Secondary | ICD-10-CM | POA: Diagnosis not present

## 2018-04-24 DIAGNOSIS — Z9011 Acquired absence of right breast and nipple: Secondary | ICD-10-CM | POA: Diagnosis not present

## 2018-04-24 HISTORY — PX: BREAST LUMPECTOMY W/ NEEDLE LOCALIZATION: SHX1266

## 2018-04-24 HISTORY — DX: Gastric ulcer, unspecified as acute or chronic, without hemorrhage or perforation: K25.9

## 2018-04-24 HISTORY — PX: BREAST LUMPECTOMY WITH NEEDLE LOCALIZATION: SHX5759

## 2018-04-24 HISTORY — DX: Malignant neoplasm of unspecified site of right female breast: C50.911

## 2018-04-24 HISTORY — DX: Nausea with vomiting, unspecified: R11.2

## 2018-04-24 HISTORY — DX: Essential tremor: G25.0

## 2018-04-24 HISTORY — DX: Cardiac arrhythmia, unspecified: I49.9

## 2018-04-24 HISTORY — DX: Personal history of irradiation: Z92.3

## 2018-04-24 HISTORY — DX: Presence of dental prosthetic device (complete) (partial): Z97.2

## 2018-04-24 HISTORY — DX: Other specified postprocedural states: Z98.890

## 2018-04-24 HISTORY — DX: Adverse effect of other nonsteroidal anti-inflammatory drugs (NSAID), initial encounter: T39.395A

## 2018-04-24 HISTORY — DX: Unspecified benign mammary dysplasia of left breast: N60.92

## 2018-04-24 LAB — CREATININE, SERUM
CREATININE: 0.88 mg/dL (ref 0.44–1.00)
GFR calc Af Amer: >60 mL/min (ref >60)
GFR calc non Af Amer: >60 mL/min (ref >60)

## 2018-04-24 LAB — CBC
HCT: 40 % (ref 36.0–46.0)
HEMOGLOBIN: 12.5 g/dL (ref 12.0–15.0)
MCH: 29.8 pg (ref 26.0–34.0)
MCHC: 31.3 g/dL (ref 30.0–36.0)
MCV: 95.2 fL (ref 80.0–100.0)
NRBC: 0 % (ref 0.0–0.2)
Platelets: 204 10*3/uL (ref 150–400)
RBC: 4.2 MIL/uL (ref 3.87–5.11)
RDW: 12.2 % (ref 11.5–15.5)
WBC: 14.7 10*3/uL — ABNORMAL HIGH (ref 4.0–10.5)

## 2018-04-24 SURGERY — BREAST LUMPECTOMY WITH NEEDLE LOCALIZATION
Anesthesia: General | Site: Breast | Laterality: Left

## 2018-04-24 MED ORDER — CELECOXIB 200 MG PO CAPS
200.0000 mg | ORAL_CAPSULE | ORAL | Status: AC
  Administered 2018-04-24: 200 mg via ORAL

## 2018-04-24 MED ORDER — HYDROCODONE-ACETAMINOPHEN 5-325 MG PO TABS: 1.0000 | ORAL_TABLET | Freq: Four times a day (QID) | ORAL | 0 refills | Status: DC | PRN

## 2018-04-24 MED ORDER — CEFAZOLIN SODIUM-DEXTROSE 2-4 GM/100ML-% IV SOLN
INTRAVENOUS | Status: AC
  Filled 2018-04-24: qty 100

## 2018-04-24 MED ORDER — EPHEDRINE SULFATE 50 MG/ML IJ SOLN
INTRAMUSCULAR | Status: DC | PRN
  Administered 2018-04-24: 10 mg via INTRAVENOUS

## 2018-04-24 MED ORDER — BUPIVACAINE-EPINEPHRINE 0.25% -1:200000 IJ SOLN
INTRAMUSCULAR | Status: DC | PRN
  Administered 2018-04-24: 30 mL

## 2018-04-24 MED ORDER — DEXAMETHASONE SODIUM PHOSPHATE 4 MG/ML IJ SOLN
INTRAMUSCULAR | Status: DC | PRN
  Administered 2018-04-24: 10 mg via INTRAVENOUS

## 2018-04-24 MED ORDER — METHYLPREDNISOLONE SODIUM SUCC 125 MG IJ SOLR
60.0000 mg | Freq: Four times a day (QID) | INTRAMUSCULAR | Status: DC
  Administered 2018-04-24 – 2018-04-25 (×2): 60 mg via INTRAVENOUS
  Filled 2018-04-24 (×2): qty 2

## 2018-04-24 MED ORDER — LIDOCAINE HCL (CARDIAC) PF 100 MG/5ML IV SOSY
PREFILLED_SYRINGE | INTRAVENOUS | Status: DC | PRN
  Administered 2018-04-24: 80 mg via INTRAVENOUS

## 2018-04-24 MED ORDER — FAMOTIDINE 20 MG PO TABS
20.0000 mg | ORAL_TABLET | Freq: Every day | ORAL | Status: DC
  Administered 2018-04-25: 20 mg via ORAL
  Filled 2018-04-24 (×2): qty 1

## 2018-04-24 MED ORDER — SCOPOLAMINE 1 MG/3DAYS TD PT72: 1.0000 | MEDICATED_PATCH | Freq: Once | TRANSDERMAL | Status: DC | PRN

## 2018-04-24 MED ORDER — ONDANSETRON HCL 4 MG/2ML IJ SOLN
4.0000 mg | Freq: Four times a day (QID) | INTRAMUSCULAR | Status: DC | PRN
  Administered 2018-04-24 – 2018-04-25 (×2): 4 mg via INTRAVENOUS
  Filled 2018-04-24 (×2): qty 2

## 2018-04-24 MED ORDER — LEVOTHYROXINE SODIUM 88 MCG PO TABS
88.0000 ug | ORAL_TABLET | Freq: Every day | ORAL | Status: DC
  Administered 2018-04-25: 88 ug via ORAL
  Filled 2018-04-24: qty 1

## 2018-04-24 MED ORDER — ONDANSETRON HCL 4 MG/2ML IJ SOLN: 4.0000 mg | Freq: Once | INTRAMUSCULAR | Status: DC | PRN

## 2018-04-24 MED ORDER — DEXAMETHASONE SODIUM PHOSPHATE 10 MG/ML IJ SOLN
INTRAMUSCULAR | Status: AC
  Filled 2018-04-24: qty 1

## 2018-04-24 MED ORDER — CEFAZOLIN SODIUM-DEXTROSE 2-4 GM/100ML-% IV SOLN: 2.0000 g | INTRAVENOUS | Status: DC

## 2018-04-24 MED ORDER — OXYCODONE HCL 5 MG PO TABS: 5.0000 mg | ORAL_TABLET | Freq: Once | ORAL | Status: DC | PRN

## 2018-04-24 MED ORDER — IBUPROFEN 800 MG PO TABS: 800.0000 mg | ORAL_TABLET | Freq: Three times a day (TID) | ORAL | 0 refills | Status: DC | PRN

## 2018-04-24 MED ORDER — MIDAZOLAM HCL 2 MG/2ML IJ SOLN: 1.0000 mg | INTRAMUSCULAR | Status: DC | PRN

## 2018-04-24 MED ORDER — ACETAMINOPHEN 500 MG PO TABS
1000.0000 mg | ORAL_TABLET | Freq: Four times a day (QID) | ORAL | Status: DC
  Administered 2018-04-24 – 2018-04-25 (×3): 1000 mg via ORAL
  Filled 2018-04-24 (×3): qty 2

## 2018-04-24 MED ORDER — GABAPENTIN 300 MG PO CAPS
ORAL_CAPSULE | ORAL | Status: AC
  Filled 2018-04-24: qty 1

## 2018-04-24 MED ORDER — CELECOXIB 200 MG PO CAPS
200.0000 mg | ORAL_CAPSULE | Freq: Two times a day (BID) | ORAL | Status: DC
  Administered 2018-04-25: 200 mg via ORAL
  Filled 2018-04-24 (×2): qty 1

## 2018-04-24 MED ORDER — TAMOXIFEN CITRATE 10 MG PO TABS
20.0000 mg | ORAL_TABLET | Freq: Every day | ORAL | Status: DC
  Administered 2018-04-25: 20 mg via ORAL
  Filled 2018-04-24 (×2): qty 2

## 2018-04-24 MED ORDER — ENOXAPARIN SODIUM 40 MG/0.4ML ~~LOC~~ SOLN
40.0000 mg | SUBCUTANEOUS | Status: DC
  Administered 2018-04-25: 40 mg via SUBCUTANEOUS
  Filled 2018-04-24: qty 0.4

## 2018-04-24 MED ORDER — HYDRALAZINE HCL 20 MG/ML IJ SOLN: 10.0000 mg | INTRAMUSCULAR | Status: DC | PRN

## 2018-04-24 MED ORDER — FENTANYL CITRATE (PF) 100 MCG/2ML IJ SOLN: 25.0000 ug | INTRAMUSCULAR | Status: DC | PRN

## 2018-04-24 MED ORDER — LIDOCAINE 2% (20 MG/ML) 5 ML SYRINGE
INTRAMUSCULAR | Status: AC
  Filled 2018-04-24: qty 5

## 2018-04-24 MED ORDER — CHLORHEXIDINE GLUCONATE CLOTH 2 % EX PADS: 6.0000 | MEDICATED_PAD | Freq: Once | CUTANEOUS | Status: DC

## 2018-04-24 MED ORDER — ACETAMINOPHEN 500 MG PO TABS
ORAL_TABLET | ORAL | Status: AC
  Filled 2018-04-24: qty 2

## 2018-04-24 MED ORDER — GABAPENTIN 300 MG PO CAPS
300.0000 mg | ORAL_CAPSULE | ORAL | Status: AC
  Administered 2018-04-24: 300 mg via ORAL

## 2018-04-24 MED ORDER — ACETAMINOPHEN 500 MG PO TABS
1000.0000 mg | ORAL_TABLET | ORAL | Status: AC
  Administered 2018-04-24: 1000 mg via ORAL

## 2018-04-24 MED ORDER — ONDANSETRON 4 MG PO TBDP: 4.0000 mg | ORAL_TABLET | Freq: Four times a day (QID) | ORAL | Status: DC | PRN

## 2018-04-24 MED ORDER — FENTANYL CITRATE (PF) 100 MCG/2ML IJ SOLN
INTRAMUSCULAR | Status: AC
  Filled 2018-04-24: qty 2

## 2018-04-24 MED ORDER — FENTANYL CITRATE (PF) 100 MCG/2ML IJ SOLN
50.0000 ug | INTRAMUSCULAR | Status: DC | PRN
  Administered 2018-04-24: 50 ug via INTRAVENOUS

## 2018-04-24 MED ORDER — PROPOFOL 10 MG/ML IV BOLUS
INTRAVENOUS | Status: AC
  Filled 2018-04-24: qty 20

## 2018-04-24 MED ORDER — LACTATED RINGERS IV SOLN
INTRAVENOUS | Status: DC
  Administered 2018-04-24 (×2): via INTRAVENOUS

## 2018-04-24 MED ORDER — CELECOXIB 200 MG PO CAPS
ORAL_CAPSULE | ORAL | Status: AC
  Filled 2018-04-24: qty 1

## 2018-04-24 MED ORDER — SUCCINYLCHOLINE CHLORIDE 20 MG/ML IJ SOLN
INTRAMUSCULAR | Status: DC | PRN
  Administered 2018-04-24: 100 mg via INTRAVENOUS

## 2018-04-24 MED ORDER — GABAPENTIN 300 MG PO CAPS
300.0000 mg | ORAL_CAPSULE | Freq: Two times a day (BID) | ORAL | Status: DC
  Administered 2018-04-25: 300 mg via ORAL
  Filled 2018-04-24 (×2): qty 1

## 2018-04-24 MED ORDER — PROPOFOL 10 MG/ML IV BOLUS
INTRAVENOUS | Status: DC | PRN
  Administered 2018-04-24: 150 mg via INTRAVENOUS

## 2018-04-24 MED ORDER — TRAMADOL HCL 50 MG PO TABS
50.0000 mg | ORAL_TABLET | Freq: Four times a day (QID) | ORAL | Status: DC | PRN
  Administered 2018-04-24: 50 mg via ORAL
  Filled 2018-04-24: qty 1

## 2018-04-24 MED ORDER — OXYCODONE HCL 5 MG/5ML PO SOLN: 5.0000 mg | Freq: Once | ORAL | Status: DC | PRN

## 2018-04-24 MED ORDER — ONDANSETRON HCL 4 MG/2ML IJ SOLN
INTRAMUSCULAR | Status: AC
  Filled 2018-04-24: qty 2

## 2018-04-24 MED ORDER — DEXTROSE-NACL 5-0.9 % IV SOLN
INTRAVENOUS | Status: DC
  Administered 2018-04-24: 18:00:00 via INTRAVENOUS

## 2018-04-24 SURGICAL SUPPLY — 59 items
ADH SKN CLS APL DERMABOND .7 (GAUZE/BANDAGES/DRESSINGS) ×1
APPLIER CLIP 11 MED OPEN (CLIP)
APPLIER CLIP 9.375 MED OPEN (MISCELLANEOUS)
APR CLP MED 11 20 MLT OPN (CLIP)
APR CLP MED 9.3 20 MLT OPN (MISCELLANEOUS)
BINDER BREAST LRG (GAUZE/BANDAGES/DRESSINGS) IMPLANT
BINDER BREAST MEDIUM (GAUZE/BANDAGES/DRESSINGS) IMPLANT
BINDER BREAST XLRG (GAUZE/BANDAGES/DRESSINGS) IMPLANT
BINDER BREAST XXLRG (GAUZE/BANDAGES/DRESSINGS) IMPLANT
BIOPATCH RED 1 DISK 7.0 (GAUZE/BANDAGES/DRESSINGS) IMPLANT
BLADE SURG 15 STRL LF DISP TIS (BLADE) ×1 IMPLANT
BLADE SURG 15 STRL SS (BLADE) ×2
CANISTER SUCT 1200ML W/VALVE (MISCELLANEOUS) ×2 IMPLANT
CHLORAPREP W/TINT 26ML (MISCELLANEOUS) ×2 IMPLANT
CLIP APPLIE 11 MED OPEN (CLIP) IMPLANT
CLIP APPLIE 9.375 MED OPEN (MISCELLANEOUS) IMPLANT
COVER BACK TABLE 60X90IN (DRAPES) ×2 IMPLANT
COVER MAYO STAND STRL (DRAPES) ×2 IMPLANT
COVER WAND RF STERILE (DRAPES) IMPLANT
DECANTER SPIKE VIAL GLASS SM (MISCELLANEOUS) ×2 IMPLANT
DERMABOND ADVANCED (GAUZE/BANDAGES/DRESSINGS) ×1
DERMABOND ADVANCED .7 DNX12 (GAUZE/BANDAGES/DRESSINGS) ×1 IMPLANT
DEVICE DUBIN W/COMP PLATE 8390 (MISCELLANEOUS) ×1 IMPLANT
DRAIN CHANNEL 19F RND (DRAIN) IMPLANT
DRAPE LAPAROSCOPIC ABDOMINAL (DRAPES) IMPLANT
DRAPE LAPAROTOMY 100X72 PEDS (DRAPES) ×2 IMPLANT
DRAPE UTILITY XL STRL (DRAPES) ×2 IMPLANT
ELECT COATED BLADE 2.86 ST (ELECTRODE) ×2 IMPLANT
ELECT REM PT RETURN 9FT ADLT (ELECTROSURGICAL) ×2
ELECTRODE REM PT RTRN 9FT ADLT (ELECTROSURGICAL) ×1 IMPLANT
EVACUATOR SILICONE 100CC (DRAIN) IMPLANT
GLOVE BIOGEL PI IND STRL 6.5 (GLOVE) IMPLANT
GLOVE BIOGEL PI IND STRL 8 (GLOVE) ×1 IMPLANT
GLOVE BIOGEL PI INDICATOR 6.5 (GLOVE) ×1
GLOVE BIOGEL PI INDICATOR 8 (GLOVE) ×1
GLOVE ECLIPSE 8.0 STRL XLNG CF (GLOVE) ×1 IMPLANT
GLOVE EXAM NITRILE MD LF STRL (GLOVE) ×1 IMPLANT
GLOVE SURG SS PI 6.5 STRL IVOR (GLOVE) ×1 IMPLANT
GLOVE SURG SS PI 8.0 STRL IVOR (GLOVE) ×1 IMPLANT
GOWN STRL REUS W/ TWL LRG LVL3 (GOWN DISPOSABLE) ×2 IMPLANT
GOWN STRL REUS W/TWL LRG LVL3 (GOWN DISPOSABLE) ×4
HEMOSTAT ARISTA ABSORB 3G PWDR (MISCELLANEOUS) IMPLANT
KIT MARKER MARGIN INK (KITS) ×1 IMPLANT
NDL HYPO 25X1 1.5 SAFETY (NEEDLE) ×1 IMPLANT
NEEDLE HYPO 25X1 1.5 SAFETY (NEEDLE) ×2 IMPLANT
NS IRRIG 1000ML POUR BTL (IV SOLUTION) ×2 IMPLANT
PACK BASIN DAY SURGERY FS (CUSTOM PROCEDURE TRAY) ×2 IMPLANT
PENCIL BUTTON HOLSTER BLD 10FT (ELECTRODE) ×2 IMPLANT
SLEEVE SCD COMPRESS KNEE MED (MISCELLANEOUS) ×2 IMPLANT
SPONGE LAP 4X18 RFD (DISPOSABLE) IMPLANT
SUT MON AB 4-0 PC3 18 (SUTURE) ×2 IMPLANT
SUT SILK 2 0 SH (SUTURE) IMPLANT
SUT VICRYL 3-0 CR8 SH (SUTURE) ×2 IMPLANT
SYR BULB 3OZ (MISCELLANEOUS) ×1 IMPLANT
SYR CONTROL 10ML LL (SYRINGE) ×2 IMPLANT
TOWEL GREEN STERILE FF (TOWEL DISPOSABLE) ×4 IMPLANT
TOWEL OR NON WOVEN STRL DISP B (DISPOSABLE) ×1 IMPLANT
TUBE CONNECTING 20X1/4 (TUBING) ×2 IMPLANT
YANKAUER SUCT BULB TIP NO VENT (SUCTIONS) ×2 IMPLANT

## 2018-04-24 NOTE — Anesthesia Preprocedure Evaluation (Addendum)
Anesthesia Evaluation  Patient identified by MRN, date of birth, ID band Patient awake    Reviewed: Allergy & Precautions, NPO status , Patient's Chart, lab work & pertinent test results  History of Anesthesia Complications (+) PONV and history of anesthetic complications (Questionable hx of awareness under anesthesia >30 years ago)  Airway Mallampati: III  TM Distance: >3 FB Neck ROM: Full    Dental  (+) Dental Advisory Given, Teeth Intact   Pulmonary neg pulmonary ROS,    breath sounds clear to auscultation       Cardiovascular + DVT   Rhythm:Regular Rate:Normal     Neuro/Psych negative neurological ROS  negative psych ROS   GI/Hepatic Neg liver ROS, PUD, GERD  Medicated,  Endo/Other  Hypothyroidism   Renal/GU negative Renal ROS  negative genitourinary   Musculoskeletal negative musculoskeletal ROS (+)   Abdominal   Peds  Hematology negative hematology ROS (+)   Anesthesia Other Findings   Reproductive/Obstetrics  Breast cancer                             Anesthesia Physical Anesthesia Plan  ASA: II  Anesthesia Plan: General   Post-op Pain Management:    Induction: Intravenous  PONV Risk Score and Plan: 4 or greater and Treatment may vary due to age or medical condition, Ondansetron, Dexamethasone and Propofol infusion  Airway Management Planned: LMA  Additional Equipment: None  Intra-op Plan:   Post-operative Plan: Extubation in OR  Informed Consent: I have reviewed the patients History and Physical, chart, labs and discussed the procedure including the risks, benefits and alternatives for the proposed anesthesia with the patient or authorized representative who has indicated his/her understanding and acceptance.   Dental advisory given  Plan Discussed with: CRNA and Anesthesiologist  Anesthesia Plan Comments:        Anesthesia Quick Evaluation

## 2018-04-24 NOTE — Discharge Instructions (Signed)
[facility] °Office Phone Number [phone] ° °BREAST BIOPSY/ PARTIAL MASTECTOMY: POST OP INSTRUCTIONS ° °Always review your discharge instruction sheet given to you by the facility where your surgery was performed. ° °IF YOU HAVE DISABILITY OR FAMILY LEAVE FORMS, YOU MUST BRING THEM TO THE OFFICE FOR PROCESSING.  DO NOT GIVE THEM TO YOUR DOCTOR. ° °1. A prescription for pain medication may be given to you upon discharge.  Take your pain medication as prescribed, if needed.  If narcotic pain medicine is not needed, then you may take acetaminophen (Tylenol) or ibuprofen (Advil) as needed. °2. Take your usually prescribed medications unless otherwise directed °3. If you need a refill on your pain medication, please contact your pharmacy.  They will contact our office to request authorization.  Prescriptions will not be filled after 5pm or on week-ends. °4. You should eat very light the first 24 hours after surgery, such as soup, crackers, pudding, etc.  Resume your normal diet the day after surgery. °5. Most patients will experience some swelling and bruising in the breast.  Ice packs and a good support bra will help.  Swelling and bruising can take several days to resolve.  °6. It is common to experience some constipation if taking pain medication after surgery.  Increasing fluid intake and taking a stool softener will usually help or prevent this problem from occurring.  A mild laxative (Milk of Magnesia or Miralax) should be taken according to package directions if there are no bowel movements after 48 hours. °7. Unless discharge instructions indicate otherwise, you may remove your bandages 24-48 hours after surgery, and you may shower at that time.  You may have steri-strips (small skin tapes) in place directly over the incision.  These strips should be left on the skin for 7-10 days.  If your surgeon used skin glue on the incision, you may shower in 24 hours.  The glue will flake off over the  next 2-3 weeks.  Any sutures or staples will be removed at the office during your follow-up visit. °8. ACTIVITIES:  You may resume regular daily activities (gradually increasing) beginning the next day.  Wearing a good support bra or sports bra minimizes pain and swelling.  You may have sexual intercourse when it is comfortable. °a. You may drive when you no longer are taking prescription pain medication, you can comfortably wear a seatbelt, and you can safely maneuver your car and apply brakes. °b. RETURN TO WORK:  ______________________________________________________________________________________ °9. You should see your doctor in the office for a follow-up appointment approximately two weeks after your surgery.  Your doctor’s nurse will typically make your follow-up appointment when she calls you with your pathology report.  Expect your pathology report 2-3 business days after your surgery.  You may call to check if you do not hear from us after three days. °10. OTHER INSTRUCTIONS: _______________________________________________________________________________________________ _____________________________________________________________________________________________________________________________________ °_____________________________________________________________________________________________________________________________________ °_____________________________________________________________________________________________________________________________________ ° °WHEN TO CALL YOUR DOCTOR: °1. Fever over 101.0 °2. Nausea and/or vomiting. °3. Extreme swelling or bruising. °4. Continued bleeding from incision. °5. Increased pain, redness, or drainage from the incision. ° °The clinic staff is available to answer your questions during regular business hours.  Please don’t hesitate to call and ask to speak to one of the nurses for clinical concerns.  If you have a medical emergency, go to the nearest  emergency room or call 911.  A surgeon from [facility] is always on call at the hospital. ° °For further questions, please visit [email or web address]  °

## 2018-04-24 NOTE — Anesthesia Procedure Notes (Signed)
Procedure Name: Intubation Performed by: Verita Lamb, CRNA Pre-anesthesia Checklist: Suction available, Patient identified, Emergency Drugs available and Patient being monitored Preoxygenation: Pre-oxygenation with 100% oxygen Induction Type: IV induction, Cricoid Pressure applied and Rapid sequence Laryngoscope Size: Mac and 3 Grade View: Grade I Tube type: Oral Tube size: 7.0 mm Number of attempts: 1 Airway Equipment and Method: Stylet Placement Confirmation: ETT inserted through vocal cords under direct vision,  positive ETCO2,  CO2 detector and breath sounds checked- equal and bilateral Secured at: 21 cm Dental Injury: Teeth and Oropharynx as per pre-operative assessment  Comments: See notes regarding aspiration.  Recommend ett if any prior anesthetics considered.

## 2018-04-24 NOTE — H&P (Signed)
Donna Manning is an 67 y.o. female.   Chief Complaint: Aspiration during outpatient surgery HPI: Patient presents status post left breast lumpectomy today for atypical ductal hyperplasia.  During the procedure she had evidence of aspiration.  She was intubated by anesthesia during the procedure after having an LMA in place.  She was stable for the remainder of the case.  Postoperatively she was requiring 2 L of oxygen and had on chest x-ray would appear to be signs of right-sided aspiration.  Her vital signs are stable.  She was complaining of some mild shortness of breath.  She is being admitted to the hospital for treatment of aspiration.  She received 1 dose of steroids during the procedure.  Past Medical History:  Diagnosis Date  . 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 Barrett's esophagus 2009  . Benign head tremor   . Breast cancer ([place]) 01/2014   right  . Dental bridge present    lower left  . Dental crowns present   . GERD (gastroesophageal reflux disease)   . History of DVT (deep vein thrombosis) > 30 years ago   after a pregnancy  . History of gastric ulcer   . History of radiation therapy 2015  . Hypothyroidism   . PONV (postoperative nausea and vomiting)     Past Surgical History:  Procedure Laterality Date  . BREAST LUMPECTOMY Right    age 31  . COLONOSCOPY    . PARTIAL MASTECTOMY WITH NEEDLE LOCALIZATION AND AXILLARY SENTINEL LYMPH NODE BX Right 01/20/2014   Procedure: RIGHT PARTIAL MASTECTOMY WITH DOUBLE  NEEDLE LOCALIZATION (BRACKETED )AND AXILLARY SENTINEL LYMPH NODE BX;  Surgeon: [name], MD;  Location: [facility];  Service: General;  Laterality: Right;  And [place].  Marland Kitchen RE-EXCISION OF BREAST CANCER,SUPERIOR MARGINS Right 02/05/2014   Procedure: RIGHT LUMPECTOMY, RE-EXCISION OF BREAST CANCER,SUPERIOR MARGINS;  Surgeon: [name], MD;  Location: [facility];  Service: General;  Laterality: Right;  .  UPPER GI ENDOSCOPY  12/25/2014   with Propofol    Family History  Problem Relation Age of Onset  . Heart failure Mother 9  . Diabetes Mother   . Parkinsonism Father 74  . Stroke Father   . Thyroid disease Sister   . Prostate cancer Brother   . Stomach cancer Paternal Grandfather    Social History:  reports that she has never smoked. She has never used smokeless tobacco. She reports that she does not drink alcohol or use drugs.  Allergies:  Allergies  Allergen Reactions  . Contrast Media [Iodinated Diagnostic Agents] Hives and Shortness Of Breath  . Actonel [Risedronate Sodium] Other (See Comments)    HIP PAIN  . Starch Rash    IN LATEX GLOVES    Medications Prior to Admission  Medication Sig Dispense Refill  . famotidine (PEPCID) 20 MG tablet Take 20 mg by mouth at bedtime.    Marland Kitchen levothyroxine (SYNTHROID, LEVOTHROID) 88 MCG tablet Take 1 tablet (88 mcg total) daily by mouth. 90 tablet 3  . tamoxifen (NOLVADEX) 20 MG tablet Take 1 tablet (20 mg total) by mouth daily. 90 tablet 3    No results found for this or any previous visit (from the past 48 hour(s)). X-ray Chest Pa Or Ap  Result Date: 04/24/2018 CLINICAL DATA:  Cough, postoperative. EXAM: CHEST  1 VIEW COMPARISON:  Radiographs of May 25, 2017. FINDINGS: Stable cardiomediastinal silhouette. Hypoinflation of the lungs is noted. Left lung is clear. New  large right lower lobe opacity is noted concerning for pneumonia or atelectasis. Small pleural effusion may be present. No pneumothorax is noted. Bony thorax is unremarkable. IMPRESSION: New large right lower lobe opacity is noted concerning for pneumonia or inflammation. Small right pleural effusion may be present. Hypoinflation of the lungs. Follow-up radiographs are recommended. Electronically Signed   By: Marijo Conception, M.D.   On: 04/24/2018 15:32   Mm Breast Surgical Specimen  Result Date: 04/24/2018 CLINICAL DATA:  Status post mammographically guided needle  localization of a left breast lesion followed by surgical excision. EXAM: SPECIMEN RADIOGRAPH OF THE LEFT BREAST COMPARISON:  Previous exam(s). FINDINGS: Status post excision of the left breast. The wire tip and biopsy marker clip are present and are marked for pathology. IMPRESSION: Specimen radiograph of the left breast. Electronically Signed   By: Lajean Manes M.D.   On: 04/24/2018 14:40   Mm Lt Plc Breast Loc Dev   1st Lesion  Inc Mammo Guide  Result Date: 04/24/2018 CLINICAL DATA:  Patient with LEFT breast high risk lesion scheduled for surgical excision today requiring preoperative needle localization. EXAM: NEEDLE LOCALIZATION OF THE LEFT BREAST WITH MAMMO GUIDANCE COMPARISON:  Previous exams. FINDINGS: Patient presents for needle localization prior to surgical excision. I met with the patient and we discussed the procedure of needle localization including benefits and alternatives. We discussed the high likelihood of a successful procedure. We discussed the risks of the procedure, including infection, bleeding, tissue injury, and further surgery. Informed, written consent was given. The usual time-out protocol was performed immediately prior to the procedure. Using mammographic guidance, sterile technique, 1% lidocaine and a 7 cm modified Kopans needle, the coil shaped clip within the outer LEFT breast was localized using lateral approach. The images were marked for Dr. Brantley Stage. IMPRESSION: Needle localization LEFT breast. No apparent complications. Electronically Signed   By: Franki Cabot M.D.   On: 04/24/2018 10:45    Review of Systems  Constitutional: Negative for fever.  Respiratory: Positive for shortness of breath.   Cardiovascular: Positive for chest pain. Negative for palpitations.  Gastrointestinal: Positive for heartburn.  Genitourinary: Positive for dysuria.  Musculoskeletal: Negative for myalgias.    Blood pressure 120/61, pulse 79, temperature 97.7 F (36.5 C), resp. rate 17,  height '5\' 7"'  (1.702 m), weight 83.8 kg, SpO2 94 %. Physical Exam  Constitutional: She is oriented to person, place, and time. She appears well-developed and well-nourished.  HENT:  Head: Normocephalic.  Eyes: Pupils are equal, round, and reactive to light.  Neck: Normal range of motion.  Cardiovascular: Normal rate.  Respiratory:  Decreased breath sounds right  Lumpectomy site clean dry and intact left breast  GI: Bowel sounds are normal.  Musculoskeletal: Normal range of motion.  Neurological: She is alert and oriented to person, place, and time.  Skin: Skin is warm and dry.  Psychiatric: She has a normal mood and affect. Her behavior is normal.  Chest x-ray shows haziness right lower lobe lung  Assessment/Plan Status post lumpectomy left breast for atypical ductal hyperplasia  Intraoperative aspiration during procedure  Admit for IV fluids, oxygen supplemental support and pulse oximetry.  Repeat chest x-ray in a.m.  Placed on Solu-Medrol 60 mg IV every 6.  Observe for worsening pulmonary signs.  Turner Daniels, MD 04/24/2018, 3:47 PM

## 2018-04-24 NOTE — Progress Notes (Signed)
Patient admitted to [facility] room 18 via [facility]. Alert and oriented. S/P left breast lumpectomy. Ice pack to site. Strong productive cough. Oriented to room. Call bell in reach.

## 2018-04-24 NOTE — Op Note (Signed)
Preoperative diagnosis: Left breast atypical ductal hyperplasia  Postoperative diagnosis: Same  Procedure: Left breast needle localized lumpectomy  Surgeon: [name], MD  Anesthesia: General with local  EBL: 10 cc  Specimen: Left breast tissue with localizing wire and clip verified by Faxitron  Drains: None  Specimen: Breast tissue as above  IV fluids: Per anesthesia record  Indications for procedure: The patient is a 67 year old female with a past medical history of right breast cancer who presents with a mammographic abnormality core biopsy proven to be atypical ductal hyperplasia.  Risk, benefits and other options discussed.  Wire localization was choosing because the patient had an iodine allergy.The procedure has been discussed with the patient. Alternatives to surgery have been discussed with the patient.  Risks of surgery include bleeding,  Infection,  Seroma formation, death,  and the need for further surgery.   The patient understands and wishes to proceed.     Description of procedure: The patient was taken back to the operating.  Left breast was marked as correct side.  She was placed supine upon the operating room table.  After induction of general anesthesia, left breast was prepped and draped in sterile fashion.  Timeout was done.  Images were available for review and she received appropriate preoperative antibiotics.  Curvilinear incision was made along the lateral left breast with a wire exited.  All tissue around the wire was excised with grossly negative margin using cautery.  The cavity was irrigated found to be hemostatic.  Images revealed the clip and wire to be in the specimen.  This was oriented and sent to pathology after inking the margins.  Wound was then closed with 3-0 Vicryl and 4-0 Monocryl after assuring hemostasis and irrigation.  Dermabond applied.  All final counts found to be correct.  The patient was awoke extubated taken to recovery in satisfactory  condition.

## 2018-04-24 NOTE — Transfer of Care (Signed)
Immediate Anesthesia Transfer of Care Note  Patient: Donna Manning  Procedure(s) Performed: LEFT BREAST LUMPECTOMY WITH NEEDLE LOCALIZATION (Left Breast)  Patient Location: PACU  Anesthesia Type:General  Level of Consciousness: awake, alert  and oriented  Airway & Oxygen Therapy: Patient Spontanous Breathing and Patient connected to face mask oxygen  Post-op Assessment: Report given to RN and Post -op Vital signs reviewed and stable  Post vital signs: Reviewed and stable  Last Vitals:  Vitals Value Taken Time  BP 131/57 04/24/2018  2:56 PM  Temp    Pulse 91 04/24/2018  3:01 PM  Resp 18 04/24/2018  3:01 PM  SpO2 94 % 04/24/2018  3:01 PM  Vitals shown include unvalidated device data.  Last Pain:  Vitals:   04/24/18 1122  TempSrc:   PainSc: 0-No pain         Complications: aspiration during anesthetic.  Chest xray obtained.[email or web address]

## 2018-04-24 NOTE — Anesthesia Procedure Notes (Signed)
Procedure Name: LMA Insertion Performed by: Verita Lamb, CRNA Pre-anesthesia Checklist: Patient identified, Emergency Drugs available, Suction available, Patient being monitored and Timeout performed Patient Re-evaluated:Patient Re-evaluated prior to induction Oxygen Delivery Method: Circle system utilized Preoxygenation: Pre-oxygenation with 100% oxygen Induction Type: IV induction Ventilation: Mask ventilation without difficulty LMA: LMA inserted LMA Size: 4.0 Number of attempts: 1 Placement Confirmation: ETT inserted through vocal cords under direct vision,  positive ETCO2,  CO2 detector and breath sounds checked- equal and bilateral Tube secured with: Tape Dental Injury: Teeth and Oropharynx as per pre-operative assessment

## 2018-04-24 NOTE — Interval H&P Note (Signed)
History and Physical Interval Note:  04/24/2018 1:43 PM  Donna Manning  has presented today for surgery, with the diagnosis of ATYPICAL LOBULAR HYPERPLASIA LEFT BREAST  The various methods of treatment have been discussed with the patient and family. After consideration of risks, benefits and other options for treatment, the patient has consented to  Procedure(s): LEFT BREAST LUMPECTOMY WITH NEEDLE LOCALIZATION (Left) as a surgical intervention .  The patient's history has been reviewed, patient examined, no change in status, stable for surgery.  I have reviewed the patient's chart and labs.  Questions were answered to the patient's satisfaction.     [place]

## 2018-04-24 NOTE — Anesthesia Postprocedure Evaluation (Signed)
Anesthesia Post Note  Patient: Donna Manning  Procedure(s) Performed: LEFT BREAST LUMPECTOMY WITH NEEDLE LOCALIZATION (Left Breast)     Patient location during evaluation: PACU Anesthesia Type: General Level of consciousness: awake and alert Pain management: pain level controlled Vital Signs Assessment: post-procedure vital signs reviewed and stable Respiratory status: spontaneous breathing, respiratory function stable and patient connected to nasal cannula oxygen Cardiovascular status: blood pressure returned to baseline and stable Postop Assessment: no apparent nausea or vomiting Anesthetic complications: yes Anesthetic complication details: anesthesia complicationsComments: Patient had an aspiration event in middle of procedure (see record) while LMA was in place. Patient intubated, SaO2 improved quickly. Patient extubated without difficulty in OR at end of procedure. SaO2 hovering in low-mid 90's on nasal cannula. CXR performed, official report read as large right lower lobe opacity. Discussed with surgeon, will transfer to [facility] for further monitoring.    Last Vitals:  Vitals:   04/24/18 1530 04/24/18 1545  BP: 120/61   Pulse: 77 79  Resp: 18 17  Temp:    SpO2: 94% 94%    Last Pain:  Vitals:   04/24/18 1545  TempSrc:   PainSc: [place]

## 2018-04-25 ENCOUNTER — Inpatient Hospital Stay (HOSPITAL_COMMUNITY): Payer: Medicare Other

## 2018-04-25 ENCOUNTER — Encounter (HOSPITAL_BASED_OUTPATIENT_CLINIC_OR_DEPARTMENT_OTHER): Payer: Self-pay | Admitting: Surgery

## 2018-04-25 DIAGNOSIS — N6092 Unspecified benign mammary dysplasia of left breast: Secondary | ICD-10-CM | POA: Diagnosis not present

## 2018-04-25 DIAGNOSIS — J9588 Other intraoperative complications of respiratory system, not elsewhere classified: Secondary | ICD-10-CM | POA: Diagnosis not present

## 2018-04-25 DIAGNOSIS — K219 Gastro-esophageal reflux disease without esophagitis: Secondary | ICD-10-CM | POA: Diagnosis not present

## 2018-04-25 DIAGNOSIS — K2270 Barrett's esophagus without dysplasia: Secondary | ICD-10-CM | POA: Diagnosis not present

## 2018-04-25 DIAGNOSIS — E039 Hypothyroidism, unspecified: Secondary | ICD-10-CM | POA: Diagnosis not present

## 2018-04-25 DIAGNOSIS — C50911 Malignant neoplasm of unspecified site of right female breast: Secondary | ICD-10-CM | POA: Diagnosis not present

## 2018-04-25 DIAGNOSIS — T17818A Gastric contents in other parts of respiratory tract causing other injury, initial encounter: Secondary | ICD-10-CM | POA: Diagnosis not present

## 2018-04-25 LAB — CBC
HCT: 38.2 % (ref 36.0–46.0)
Hemoglobin: 12.2 g/dL (ref 12.0–15.0)
MCH: 30.4 pg (ref 26.0–34.0)
MCHC: 31.9 g/dL (ref 30.0–36.0)
MCV: 95.3 fL (ref 80.0–100.0)
PLATELETS: 202 10*3/uL (ref 150–400)
RBC: 4.01 MIL/uL (ref 3.87–5.11)
RDW: 12.2 % (ref 11.5–15.5)
WBC: 20 10*3/uL — ABNORMAL HIGH (ref 4.0–10.5)
nRBC: 0 % (ref 0.0–0.2)

## 2018-04-25 LAB — BASIC METABOLIC PANEL
Anion gap: 10 (ref 5–15)
BUN: 11 mg/dL (ref 8–23)
CO2: 20 mmol/L — ABNORMAL LOW (ref 22–32)
CREATININE: 0.79 mg/dL (ref 0.44–1.00)
Calcium: 8.7 mg/dL — ABNORMAL LOW (ref 8.9–10.3)
Chloride: 107 mmol/L (ref 98–111)
GFR calc Af Amer: >60 mL/min (ref >60)
GFR calc non Af Amer: >60 mL/min (ref >60)
Glucose, Bld: 185 mg/dL — ABNORMAL HIGH (ref 70–99)
Potassium: 4.2 mmol/L (ref 3.5–5.1)
SODIUM: 137 mmol/L (ref 135–145)

## 2018-04-25 MED ORDER — ALBUTEROL SULFATE HFA 108 (90 BASE) MCG/ACT IN AERS
INHALATION_SPRAY | RESPIRATORY_TRACT | Status: DC | PRN
  Administered 2018-04-24: 4 via RESPIRATORY_TRACT

## 2018-04-25 MED ORDER — CEFAZOLIN SODIUM-DEXTROSE 2-3 GM-%(50ML) IV SOLR
INTRAVENOUS | Status: DC | PRN
  Administered 2018-04-24: 2 g via INTRAVENOUS

## 2018-04-25 MED ORDER — ONDANSETRON 4 MG PO TBDP: 4.0000 mg | ORAL_TABLET | Freq: Four times a day (QID) | ORAL | 0 refills | Status: DC | PRN

## 2018-04-25 NOTE — Addendum Note (Signed)
Addendum  created 04/25/18 1040 by Tawni Millers, CRNA   Charge Capture section accepted

## 2018-04-25 NOTE — Progress Notes (Signed)
1 Day Post-Op   Subjective/Chief Complaint: Breathing well.  On 2 L oxygen.  Saturations are 96%.  She has no tachypnea.  She has a good productive cough this morning.  She is doing over 800 cc on her incentive spirometer.  Chest x-ray reviewed which showed signs of right-sided aspiration.  She does have a leukocytosis but has been on steroids overnight to hopefully suppress.  She has no breast pain.   Objective: Vital signs in last 24 hours: Temp:  [97.7 F (36.5 C)-98.5 F (36.9 C)] 98.5 F (36.9 C) (10/09 0622) Pulse Rate:  [69-99] 83 (10/09 0622) Resp:  [14-22] 20 (10/09 0229) BP: (112-140)/(49-73) 126/71 (10/09 0622) SpO2:  [93 %-100 %] 100 % (10/08 1734) Weight:  [83.8 kg-86.2 kg] 86.2 kg (10/08 1734) Last BM Date: 04/23/18  Intake/Output from previous day: 10/08 0701 - 10/09 0700 In: 1130 [P.O.:130; I.V.:1000] Out: 415 [Urine:300; Blood:15] Intake/Output this shift: No intake/output data recorded.  Resp: Decreased breath sounds on left.  Right breast sounds with some coarse crackles. Cardio: regular rate and rhythm, S1, S2 normal, no murmur, click, rub or gallop Incision/Wound: Left breast incision clean dry and intact  Lab Results:  Recent Labs    04/24/18 2013 04/25/18 0346  WBC 14.7* 20.0*  HGB 12.5 12.2  HCT 40.0 38.2  PLT 204 202   BMET Recent Labs    04/24/18 2013 04/25/18 0346  NA  --  137  K  --  4.2  CL  --  107  CO2  --  20*  GLUCOSE  --  185*  BUN  --  11  CREATININE 0.88 0.79  CALCIUM  --  8.7*   PT/INR No results for input(s): LABPROT, INR in the last 72 hours. ABG No results for input(s): PHART, HCO3 in the last 72 hours.  Invalid input(s): PCO2, PO2  Studies/Results: X-ray Chest Pa Or Ap  Result Date: 04/24/2018 CLINICAL DATA:  Cough, postoperative. EXAM: CHEST  1 VIEW COMPARISON:  Radiographs of May 25, 2017. FINDINGS: Stable cardiomediastinal silhouette. Hypoinflation of the lungs is noted. Left lung is clear. New large right  lower lobe opacity is noted concerning for pneumonia or atelectasis. Small pleural effusion may be present. No pneumothorax is noted. Bony thorax is unremarkable. IMPRESSION: New large right lower lobe opacity is noted concerning for pneumonia or inflammation. Small right pleural effusion may be present. Hypoinflation of the lungs. Follow-up radiographs are recommended. Electronically Signed   By: Marijo Conception, M.D.   On: 04/24/2018 15:32   Mm Breast Surgical Specimen  Result Date: 04/24/2018 CLINICAL DATA:  Status post mammographically guided needle localization of a left breast lesion followed by surgical excision. EXAM: SPECIMEN RADIOGRAPH OF THE LEFT BREAST COMPARISON:  Previous exam(s). FINDINGS: Status post excision of the left breast. The wire tip and biopsy marker clip are present and are marked for pathology. IMPRESSION: Specimen radiograph of the left breast. Electronically Signed   By: Lajean Manes M.D.   On: 04/24/2018 14:40   Mm Lt Plc Breast Loc Dev   1st Lesion  Inc Mammo Guide  Result Date: 04/24/2018 CLINICAL DATA:  Patient with LEFT breast high risk lesion scheduled for surgical excision today requiring preoperative needle localization. EXAM: NEEDLE LOCALIZATION OF THE LEFT BREAST WITH MAMMO GUIDANCE COMPARISON:  Previous exams. FINDINGS: Patient presents for needle localization prior to surgical excision. I met with the patient and we discussed the procedure of needle localization including benefits and alternatives. We discussed the high likelihood of  a successful procedure. We discussed the risks of the procedure, including infection, bleeding, tissue injury, and further surgery. Informed, written consent was given. The usual time-out protocol was performed immediately prior to the procedure. Using mammographic guidance, sterile technique, 1% lidocaine and a 7 cm modified Kopans needle, the coil shaped clip within the outer LEFT breast was localized using lateral approach. The images  were marked for Dr. Brantley Stage. IMPRESSION: Needle localization LEFT breast. No apparent complications. Electronically Signed   By: Franki Cabot M.D.   On: 04/24/2018 10:45    Anti-infectives: Anti-infectives (From admission, onward)   Start     Dose/Rate Route Frequency Ordered Stop   04/24/18 1115  ceFAZolin (ANCEF) IVPB 2g/100 mL premix  Status:  Discontinued     2 g 200 mL/hr over 30 Minutes Intravenous On call to O.R. 04/24/18 1108 04/24/18 1715      Assessment/Plan: s/p Procedure(s): LEFT BREAST LUMPECTOMY WITH NEEDLE LOCALIZATION (Left) Aspiration during the case  Patient looks well.  Will wean oxygen this morning.  Will stop steroids.  Possible discharge home later today depending on how she does.  She will need follow-up with her primary care physician within the next week.  LOS: 1 day    Joyice Faster Tressia Labrum 04/25/2018

## 2018-04-25 NOTE — Progress Notes (Signed)
Patient maintaining saturations on room air of 94%.  She is not tachypneic.  She does have some nausea but is able to drink liquids.  She wishes to go home and look stable medically to do so.  I have instructed her that if she develops more cough or shortness of breath she will need to contact her primary care physician for evaluation.

## 2018-04-25 NOTE — Progress Notes (Signed)
Patient given discharge instructions, patient verbalized understanding. Breast binder applied prior to discharge. Patient left unit in stable condition via wheelchair with nursing staff.

## 2018-04-25 NOTE — Discharge Summary (Signed)
Physician Discharge Summary  Patient ID: Donna Manning MRN: [id number] DOB/AGE: Nov 29, 1950 67 y.o.  Admit date: 04/24/2018 Discharge date: 04/25/2018  Admission Diagnoses: Left breast atypical ductal hyperplasia  Aspiration during surgery into airway  Discharge Diagnoses:  Active Problems:   Aspiration into airway   Discharged Condition: good  Hospital Course: Patient underwent lumpectomy at the outpatient surgical center.  During the procedure, she aspirated into her LMA requiring endotracheal intubation.  Her vital signs signs remained stable.  She did have signs of aspiration or post operative chest x-ray and was admitted for treatment.  She was placed on oxygen.  As this was weaned on postop day 1.  She was able to maintain her saturations on room air above 92%.  She was not tachycardic or hyperdynamic.  Her repeat chest x-ray showed findings consistent with a right lower lobe aspiration but these appeared stable.  She was placed on pulse steroids overnight.  Her white count jumped to 20,000 but this was secondary to steroid administration with a likely.  She had some crackles in the right side.  Overall, she was stable and desired to go home when she was off oxygen and was not tachycardic.  She did have some issues with nausea treated medically.  She is a long-standing history of Barrett's and esophagitis.  She was discharged home in stable condition.  Postoperative instructions given as well as if she continues to have or worsening of pulmonary issues.     Treatments: surgery: Left breast lumpectomy  Discharge Exam: Blood pressure 126/71, pulse 83, temperature 98.5 F (36.9 C), temperature source Oral, resp. rate 20, height 5\' 7"  (1.702 m), weight 86.2 kg, SpO2 100 %. General appearance: alert and cooperative Resp: Rhonchi right sided.  Lower lobe.  Left side with diminished breath sounds but otherwise clear Cardio: regular rate and rhythm, S1, S2 normal, no murmur, click, rub  or gallop Incision/Wound: Left breast incision clean dry and intact without signs of hematoma or infection.  Disposition: Discharge disposition: 01-Home or Self Care       Discharge Instructions    Diet - low sodium heart healthy   Complete by:  As directed    Diet - low sodium heart healthy   Complete by:  As directed    Increase activity slowly   Complete by:  As directed    Increase activity slowly   Complete by:  As directed      Allergies as of 04/25/2018      Reactions   Contrast Media [iodinated Diagnostic Agents] Hives, Shortness Of Breath   Actonel [risedronate Sodium] Other (See Comments)   HIP PAIN   Starch Rash   IN LATEX GLOVES      Medication List    TAKE these medications   famotidine 20 MG tablet Commonly known as:  PEPCID Take 20 mg by mouth at bedtime.   HYDROcodone-acetaminophen 5-325 MG tablet Commonly known as:  NORCO/VICODIN Take 1 tablet by mouth every 6 (six) hours as needed for moderate pain.   ibuprofen 800 MG tablet Commonly known as:  ADVIL,MOTRIN Take 1 tablet (800 mg total) by mouth every 8 (eight) hours as needed.   levothyroxine 88 MCG tablet Commonly known as:  SYNTHROID, LEVOTHROID Take 1 tablet (88 mcg total) daily by mouth.   ondansetron 4 MG disintegrating tablet Commonly known as:  ZOFRAN-ODT Take 1 tablet (4 mg total) by mouth every 6 (six) hours as needed for nausea.   tamoxifen 20 MG tablet Commonly known as:  NOLVADEX Take 1 tablet (20 mg total) by mouth daily.        Signed: Joyice Faster Gini Caputo 04/25/2018, 10:04 AM

## 2018-04-25 NOTE — Addendum Note (Signed)
Addendum  created 04/25/18 1006 by Verita Lamb, CRNA   Intraprocedure Meds edited

## 2018-04-25 NOTE — Plan of Care (Signed)
  Problem: Education: Goal: Knowledge of General Education information will improve Description Including pain rating scale, medication(s)/side effects and non-pharmacologic comfort measures Outcome: Progressing Note:  POC reviewed with pt., and pain management.   

## 2018-04-27 ENCOUNTER — Ambulatory Visit (INDEPENDENT_AMBULATORY_CARE_PROVIDER_SITE_OTHER): Payer: Medicare Other

## 2018-04-27 ENCOUNTER — Ambulatory Visit (INDEPENDENT_AMBULATORY_CARE_PROVIDER_SITE_OTHER): Payer: Medicare Other | Admitting: Family Medicine

## 2018-04-27 ENCOUNTER — Encounter: Payer: Self-pay | Admitting: Family Medicine

## 2018-04-27 VITALS — BP 106/60 | HR 83 | Temp 98.4°F | Wt 182.2 lb

## 2018-04-27 DIAGNOSIS — J690 Pneumonitis due to inhalation of food and vomit: Secondary | ICD-10-CM

## 2018-04-27 MED ORDER — ALBUTEROL SULFATE HFA 108 (90 BASE) MCG/ACT IN AERS: 2.0000 | INHALATION_SPRAY | RESPIRATORY_TRACT | 0 refills | Status: DC | PRN

## 2018-04-27 MED ORDER — LEVOFLOXACIN 500 MG PO TABS: 500.0000 mg | ORAL_TABLET | Freq: Every day | ORAL | 0 refills | Status: AC

## 2018-04-27 NOTE — Consult Note (Signed)
            [facility] CM Primary Care Navigator  04/27/2018  Donna Manning 24-Oct-1950 [id number]   Attemptto seepatient at the bedside to identify possible discharge needs butshewasalreadydischargedhomeper staff.  Per MD note,patient underwent left breast lumpectomy at the outpatient surgical center but was complicated with aspiration into airway during surgery.  Primary care provider's office is listed as providing transition of care (TOC) follow-up.  Patient has discharge follow-up withprimary care provider on 04/27/18 and surgery follow-up approximately 2 weeks after surgery.   For additional questions please contact:  Edwena Felty A. Azalya Galyon, BSN, RN-BC [facility] PRIMARY CARE Navigator Cell: [phone]

## 2018-04-27 NOTE — Progress Notes (Signed)
   Subjective:    Patient ID: Donna Manning, female    DOB: February 04, 1951, 67 y.o.   MRN: [id number]  HPI Here to follow up on a hospital stay from 04-24-18 to 04-25-18 for a left breast lumpectomy per Dr. Brantley Stage. Following anesthesia she apparently aspirated and developed a RLL pneumonia. She received some IV antibiotics and then was sent home on no oral antibiotics. Since going home she has felt awful with fatigue, nausea without vomiting, chest congestion, coughing, and SOB. She has some mild anterior chest pains off and on. The cough produces sputum that has gone from brown to clear in color. She had fever for 2 days but this has resolved. She is taking Zofran. Her appetite is very poor but she is trying to drink fluids.    Review of Systems  Constitutional: Positive for fatigue. Negative for fever.  Respiratory: Positive for cough, chest tightness, shortness of breath and wheezing.   Cardiovascular: Positive for chest pain. Negative for palpitations and leg swelling.  Gastrointestinal: Positive for nausea. Negative for abdominal distention, abdominal pain, constipation, diarrhea and vomiting.  Neurological: Negative.        Objective:   Physical Exam  Constitutional: She is oriented to person, place, and time.  She is alert but weak, walks with a cane   Neck: No thyromegaly present.  Cardiovascular: Normal rate, regular rhythm, normal heart sounds and intact distal pulses.  Pulmonary/Chest:  The left lung fields are clear. The entire right lung has rhonchi, wheezes, and rales.   Abdominal: Soft. Bowel sounds are normal. She exhibits no distension and no mass. There is no tenderness. There is no rebound and no guarding.  Lymphadenopathy:    She has no cervical adenopathy.  Neurological: She is alert and oriented to person, place, and time.          Assessment & Plan:  Right sided aspiration pneumonia. We will start her on Levaquin 500 mg daily for 10 days . Add a Proventil HFA  to use as needed. Our Xray machine is down today so we will send her to the [place] site now for a CXR.  Alysia Penna, MD

## 2018-05-24 DIAGNOSIS — Z23 Encounter for immunization: Secondary | ICD-10-CM | POA: Diagnosis not present

## 2018-06-01 NOTE — Progress Notes (Signed)
Chief Complaint  Patient presents with  . Annual Exam    head shaking/tremor x 2-3 years. Seems to be worsening, patients children are telling her that it is more noticeable   . Medication Management  . Hypothyroidism    HPI:  Donna Manning 67 y.o. comes in today for yearly visit  .Since last visit.   Had surgery for  Poss breast cancer dx as  Atypical lesion pre cancerous left breast  Had post op aspiration pna and now better   rx as op.   She is under suppressive rx for breast cancer in past .   Family reports over the past year  Head movements    She wasn't aware  Was worse recently but  Then some abatement   ocass left hand tremor  Non dysfunction  No new   neuro  sx   Falling balance , voice  Changes  She is r handed   Thyroid no change  Takes religiously  Needs refill    Fam hx father  Parkinson in 38s  Passed early 8-70's  Paternal aunt different kind later onset .   GM had hand tremor that didn't seem to be  A progressive disease   Health Maintenance  Topic Date Due  . PNA vac Low Risk Adult (2 of 2 - PPSV23) 05/24/2018  . MAMMOGRAM  03/27/2020  . COLONOSCOPY  11/16/2020  . TETANUS/TDAP  11/29/2021  . INFLUENZA VACCINE  Completed  . DEXA SCAN  Completed  . Hepatitis C Screening  Completed   Health Maintenance Review LIFESTYLE:  Exercise:  Active  grandkids 2 days per week.  Tobacco/ETS:  no Alcohol:   No  Sugar beverages: coke   1 per day .  Sleep:  8 hours  Drug use: no HH:  2   Dog to leave   Husband   With dementia   Short term .  Early stages    Hearing:  Ok   Vision:  No limitations at present . Last eye check UTD  Safety:  Has smoke detector and wears seat belts.  [facility] dentist regularly.  Memory: Felt to be good  , no concern from her or her family.  Depression: No anhedonia unusual crying or depressive symptoms  Nutrition: Eats well balanced diet; adequate calcium and vitamin D. No swallowing chewing problems.  ROS:  Gets urge  incontinence gyne offered med  She declined and doing  Self exercises  GEN/ HEENT: No fever, significant weight changes sweats headaches vision problems hearing changes, CV/ PULM; No chest pain shortness of breath cough, syncope,edema  change in exercise tolerance. GI /GU: No adominal pain, vomiting, change in bowel habits. No blood in the stool. . SKIN/HEME: ,no acute skin rashes suspicious lesions or bleeding. No lymphadenopathy, nodules, masses.  NEURO/ PSYCH:  No  weakness numbness. No depression anxiety. IMM/ Allergy: No unusual infections.  Allergy .   REST of 12 system review negative except as per HPI   Past Medical History:  Diagnosis Date  . 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 Barrett's esophagus 2009  . Benign head tremor   . Breast cancer, right breast ([place]) 01/2014   "had 5 weeks radiation after lumpectomy" (04/24/2018)  . Dental bridge present    lower left  . Dental crowns present   . GERD (gastroesophageal reflux disease)   . History of DVT (deep vein thrombosis) ~ 1980   after a pregnancy  . History of radiation therapy  2015  . Hypothyroidism   . Irregular heart beat    "dx'd by 1 dr; not noted by the cardiologist" (04/24/2018)  . NSAID-induced gastric ulcer   . PONV (postoperative nausea and vomiting)     Family History  Problem Relation Age of Onset  . Heart failure Mother 76  . Diabetes Mother   . Parkinsonism Father 91  . Stroke Father   . Thyroid disease Sister   . Prostate cancer Brother   . Stomach cancer Paternal Grandfather     Social History   Socioeconomic History  . Marital status: Married    Spouse name: Not on file  . Number of children: 5  . Years of education: Not on file  . Highest education level: Not on file  Occupational History  . Occupation: homemaker  Social Needs  . Financial resource strain: Not on file  . Food insecurity:    Worry: Not on file    Inability: Not on file  . Transportation needs:     Medical: Not on file    Non-medical: Not on file  Tobacco Use  . Smoking status: Never Smoker  . Smokeless tobacco: Never Used  Substance and Sexual Activity  . Alcohol use: Not Currently    Comment: 04/24/2018 "drank a few beers in college"  . Drug use: Never  . Sexual activity: Not Currently  Lifestyle  . Physical activity:    Days per week: Not on file    Minutes per session: Not on file  . Stress: Not on file  Relationships  . Social connections:    Talks on phone: Not on file    Gets together: Not on file    Attends religious service: Not on file    Active member of club or organization: Not on file    Attends meetings of clubs or organizations: Not on file    Relationship status: Not on file  Other Topics Concern  . Not on file  Social History Narrative  . Not on file    Outpatient Encounter Medications as of 06/04/2018  Medication Sig  . albuterol (PROVENTIL HFA;VENTOLIN HFA) 108 (90 Base) MCG/ACT inhaler Inhale 2 puffs into the lungs every 4 (four) hours as needed for wheezing or shortness of breath.  . famotidine (PEPCID) 20 MG tablet Take 20 mg by mouth at bedtime.  Marland Kitchen levothyroxine (SYNTHROID, LEVOTHROID) 88 MCG tablet Take 1 tablet (88 mcg total) by mouth daily.  . tamoxifen (NOLVADEX) 20 MG tablet Take 1 tablet (20 mg total) by mouth daily.  . [DISCONTINUED] levothyroxine (SYNTHROID, LEVOTHROID) 88 MCG tablet Take 1 tablet (88 mcg total) daily by mouth.  . [DISCONTINUED] HYDROcodone-acetaminophen (NORCO/VICODIN) 5-325 MG tablet Take 1 tablet by mouth every 6 (six) hours as needed for moderate pain.  . [DISCONTINUED] ibuprofen (ADVIL,MOTRIN) 800 MG tablet Take 1 tablet (800 mg total) by mouth every 8 (eight) hours as needed.  . [DISCONTINUED] ondansetron (ZOFRAN-ODT) 4 MG disintegrating tablet Take 1 tablet (4 mg total) by mouth every 6 (six) hours as needed for nausea.   No facility-administered encounter medications on file as of 06/04/2018.     EXAM:  BP  116/84 (BP Location: Right Arm, Patient Position: Sitting, Cuff Size: Normal)   Pulse 77   Temp 98.3 F (36.8 C) (Oral)   Ht 5\' 7"  (1.702 m)   Wt 182 lb (82.6 kg)   BMI 28.51 kg/m   Body mass index is 28.51 kg/m.  Physical Exam: Vital signs reviewed EXN:TZGY is a  well-developed well-nourished alert cooperative   who appears stated age in no acute distress.  No tremor noted at initial but rare infrequent  short lived head   HEENT: normocephalic atraumatic , Eyes: PERRL EOM's full, conjunctiva clear, Nares: paten,t no deformity discharge or tenderness., Ears: no deformity EAC's clear TMs with normal landmarks. Mouth: clear OP, no lesions, edema.  Moist mucous membranes. Dentition in adequate repair. NECK: supple without masses, thyromegaly or bruits. CHEST/PULM:  Clear to auscultation and percussion breath sounds equal no wheeze , rales or rhonchi. No chest wall deformities or tenderness. healing surgical scar left breast  CV: PMI is nondisplaced, S1 S2 no gallops, murmurs, rubs. Peripheral pulses are full without delay.No JVD .  ABDOMEN: Bowel sounds normal nontender  No guard or rebound, no hepato splenomegal no CVA tenderness.   Extremtities:  No clubbing cyanosis or edema, no acute joint swelling or redness no focal atrophy NEURO:  Oriented x3, cranial nerves 3-12 appear to be intact, no obvious focal weakness,gait  Grossly within normal limits no abnormal reflexes or asymmetrical ocass  Fine tremor with head tilt to right?  Tongue no fasciulations   No  Pill rolling at rest  SKIN: No acute rashes normal turgor, color, no bruising or petechiae. PSYCH: Oriented, good eye contact, no obvious depression anxiety, cognition and judgment appear normal. LN: no cervical axillary adenopathy No noted deficits in memory, attention, and speech.   Lab Results  Component Value Date   WBC 4.9 06/04/2018   HGB 12.7 06/04/2018   HCT 37.9 06/04/2018   PLT 253.0 06/04/2018   GLUCOSE 103 (H)  06/04/2018   CHOL 200 06/04/2018   TRIG 200.0 (H) 06/04/2018   HDL 49.20 06/04/2018   LDLDIRECT 147.2 11/23/2011   LDLCALC 110 (H) 06/04/2018   ALT 12 06/04/2018   AST 16 06/04/2018   NA 141 06/04/2018   K 4.1 06/04/2018   CL 105 06/04/2018   CREATININE 0.83 06/04/2018   BUN 12 06/04/2018   CO2 27 06/04/2018   TSH 1.81 06/04/2018   HGBA1C 6.0 06/04/2018    ASSESSMENT AND PLAN:  Discussed the following assessment and plan:  Hypothyroidism, unspecified type - Plan: Hepatic function panel, CBC with Differential/Platelet, Basic metabolic panel, Hemoglobin A1c, TSH, Lipid panel  Medication management - Plan: Hepatic function panel, CBC with Differential/Platelet, Basic metabolic panel, Hemoglobin A1c, TSH, Lipid panel  Head movements abnormal - Plan: Hepatic function panel, CBC with Differential/Platelet, Basic metabolic panel, Hemoglobin A1c, TSH, Lipid panel, Ambulatory referral to Neurology  Fasting hyperglycemia - Plan: Hepatic function panel, CBC with Differential/Platelet, Basic metabolic panel, Hemoglobin A1c, TSH, Lipid panel  Tremor - Plan: Ambulatory referral to Neurology  Family history of Parkinson disease - father early onset?  and paternal aunt  later  - Plan: Ambulatory referral to Neurology  Need for pneumococcal vaccination - Plan: Pneumococcal polysaccharide vaccine 23-valent greater than or equal to 2yo subcutaneous/IM  History of breast cancer  Urge incontinence  Patient Care Team: Burnis Medin, MD as PCP - General [name], MD as Attending Physician (Obstetrics and Gynecology) Sharyne Peach, MD (Ophthalmology) Fanny Skates, MD as Consulting Physician (General Surgery) Thea Silversmith, MD as Consulting Physician (Radiation Oncology) Mauri Pole, MD as Consulting Physician (Gastroenterology) Nicholas Lose, MD as Consulting Physician (Hematology and Oncology)  Patient Instructions  Will be contacted about  Neurology appt     concerning the head  Movements .   Will notify you  of labs when available.  Consdier urologic physical therapy .  Can ask your Gyne   Disc with Dr.  Lindi Adie  about  Following the precancer breast disease . Tremor A tremor is trembling or shaking that you cannot control. Most tremors affect the hands or arms. Tremors can also affect the head, vocal cords, face, and other parts of the body. There are many types of tremors. Common types include:  Essential tremor. These usually occur in people over the age of 59. It may run in families and can happen in otherwise healthy people.  Resting tremor. These occur when the muscles are at rest, such as when your hands are resting in your lap. People with Parkinson disease often have resting tremors.  Postural tremor. These occur when you try to hold a pose, such as keeping your hands outstretched.  Kinetic tremor. These occur during purposeful movement, such as trying to touch a finger to your nose.  Task-specific tremor. These may occur when you perform tasks such as handwriting, speaking, or standing.  Psychogenic tremor. These dramatically lessen or disappear when you are distracted. They can happen in people of all ages.  Some types of tremors have no known cause. Tremors can also be a symptom of nervous system problems (neurological disorders) that may occur with aging. Some tremors go away with treatment while others do not. Follow these instructions at home: Watch your tremor for any changes. The following actions may help to lessen any discomfort you are feeling:  Take medicines only as directed by your health care provider.  Limit alcohol intake to no more than 1 drink per day for nonpregnant women and 2 drinks per day for men. One drink equals 12 oz of beer, 5 oz of wine, or 1 oz of hard liquor.  Do not use any tobacco products, including cigarettes, chewing tobacco, or electronic cigarettes. If you need help quitting, ask your health  care provider.  Avoid extreme heat or cold.  Limit the amount of caffeine you consumeas directed by your health care provider.  Try to get 8 hours of sleep each night.  Find ways to manage your stress, such as meditation or yoga.  Keep all follow-up visits as directed by your health care provider. This is important.  Contact a health care provider if:  You start having a tremor after starting a new medicine.  You have tremor with other symptoms such as: ? Numbness. ? Tingling. ? Pain. ? Weakness.  Your tremor gets worse.  Your tremor interferes with your day-to-day life. This information is not intended to replace advice given to you by your health care provider. Make sure you discuss any questions you have with your health care provider. Document Released: 06/24/2002 Document Revised: 03/06/2016 Document Reviewed: 12/30/2013 Elsevier Interactive Patient Education  2018 [place]. Elzora Cullins M.D.

## 2018-06-04 ENCOUNTER — Encounter: Payer: Self-pay | Admitting: Internal Medicine

## 2018-06-04 ENCOUNTER — Ambulatory Visit (INDEPENDENT_AMBULATORY_CARE_PROVIDER_SITE_OTHER): Payer: Medicare Other | Admitting: Internal Medicine

## 2018-06-04 VITALS — BP 116/84 | HR 77 | Temp 98.3°F | Ht 67.0 in | Wt 182.0 lb

## 2018-06-04 DIAGNOSIS — R250 Abnormal head movements: Secondary | ICD-10-CM

## 2018-06-04 DIAGNOSIS — R7301 Impaired fasting glucose: Secondary | ICD-10-CM

## 2018-06-04 DIAGNOSIS — E039 Hypothyroidism, unspecified: Secondary | ICD-10-CM | POA: Diagnosis not present

## 2018-06-04 DIAGNOSIS — Z853 Personal history of malignant neoplasm of breast: Secondary | ICD-10-CM

## 2018-06-04 DIAGNOSIS — Z820 Family history of epilepsy and other diseases of the nervous system: Secondary | ICD-10-CM

## 2018-06-04 DIAGNOSIS — Z23 Encounter for immunization: Secondary | ICD-10-CM

## 2018-06-04 DIAGNOSIS — N3941 Urge incontinence: Secondary | ICD-10-CM

## 2018-06-04 DIAGNOSIS — R251 Tremor, unspecified: Secondary | ICD-10-CM | POA: Diagnosis not present

## 2018-06-04 DIAGNOSIS — Z79899 Other long term (current) drug therapy: Secondary | ICD-10-CM | POA: Diagnosis not present

## 2018-06-04 LAB — HEPATIC FUNCTION PANEL
ALK PHOS: 35 U/L — AB (ref 39–117)
ALT: 12 U/L (ref 0–35)
AST: 16 U/L (ref 0–37)
Albumin: 4.3 g/dL (ref 3.5–5.2)
BILIRUBIN DIRECT: 0.1 mg/dL (ref 0.0–0.3)
BILIRUBIN TOTAL: 0.4 mg/dL (ref 0.2–1.2)
Total Protein: 6.8 g/dL (ref 6.0–8.3)

## 2018-06-04 LAB — TSH: TSH: 1.81 u[IU]/mL (ref 0.35–4.50)

## 2018-06-04 LAB — BASIC METABOLIC PANEL
BUN: 12 mg/dL (ref 6–23)
CALCIUM: 9.3 mg/dL (ref 8.4–10.5)
CO2: 27 mEq/L (ref 19–32)
CREATININE: 0.83 mg/dL (ref 0.40–1.20)
Chloride: 105 mEq/L (ref 96–112)
GFR: 72.51 mL/min (ref >60.00)
Glucose, Bld: 103 mg/dL — ABNORMAL HIGH (ref 70–99)
Potassium: 4.1 mEq/L (ref 3.5–5.1)
Sodium: 141 mEq/L (ref 135–145)

## 2018-06-04 LAB — LIPID PANEL
CHOL/HDL RATIO: 4
Cholesterol: 200 mg/dL (ref 0–200)
HDL: 49.2 mg/dL (ref >39.00)
LDL Cholesterol: 110 mg/dL — ABNORMAL HIGH (ref 0–99)
NONHDL: 150.38
Triglycerides: 200 mg/dL — ABNORMAL HIGH (ref 0.0–149.0)
VLDL: 40 mg/dL (ref 0.0–40.0)

## 2018-06-04 LAB — CBC WITH DIFFERENTIAL/PLATELET
BASOS PCT: 1.1 % (ref 0.0–3.0)
Basophils Absolute: 0.1 10*3/uL (ref 0.0–0.1)
Eosinophils Absolute: 0.3 10*3/uL (ref 0.0–0.7)
Eosinophils Relative: 6.3 % — ABNORMAL HIGH (ref 0.0–5.0)
HEMATOCRIT: 37.9 % (ref 36.0–46.0)
Hemoglobin: 12.7 g/dL (ref 12.0–15.0)
LYMPHS ABS: 1.7 10*3/uL (ref 0.7–4.0)
LYMPHS PCT: 34.4 % (ref 12.0–46.0)
MCHC: 33.5 g/dL (ref 30.0–36.0)
MCV: 92.3 fl (ref 78.0–100.0)
MONOS PCT: 9 % (ref 3.0–12.0)
Monocytes Absolute: 0.4 10*3/uL (ref 0.1–1.0)
NEUTROS ABS: 2.4 10*3/uL (ref 1.4–7.7)
NEUTROS PCT: 49.2 % (ref 43.0–77.0)
PLATELETS: 253 10*3/uL (ref 150.0–400.0)
RBC: 4.11 Mil/uL (ref 3.87–5.11)
RDW: 12.8 % (ref 11.5–15.5)
WBC: 4.9 10*3/uL (ref 4.0–10.5)

## 2018-06-04 LAB — HEMOGLOBIN A1C: Hgb A1c MFr Bld: 6 % (ref 4.6–6.5)

## 2018-06-04 MED ORDER — LEVOTHYROXINE SODIUM 88 MCG PO TABS: 88.0000 ug | ORAL_TABLET | Freq: Every day | ORAL | 3 refills | Status: DC

## 2018-06-04 NOTE — Patient Instructions (Addendum)
Will be contacted about  Neurology appt    concerning the head  Movements .   Will notify you  of labs when available.  Consdier urologic physical therapy . Can ask your Gyne   Disc with Dr.  Lindi Adie  about  Following the precancer breast disease . Tremor A tremor is trembling or shaking that you cannot control. Most tremors affect the hands or arms. Tremors can also affect the head, vocal cords, face, and other parts of the body. There are many types of tremors. Common types include:  Essential tremor. These usually occur in people over the age of 15. It may run in families and can happen in otherwise healthy people.  Resting tremor. These occur when the muscles are at rest, such as when your hands are resting in your lap. People with Parkinson disease often have resting tremors.  Postural tremor. These occur when you try to hold a pose, such as keeping your hands outstretched.  Kinetic tremor. These occur during purposeful movement, such as trying to touch a finger to your nose.  Task-specific tremor. These may occur when you perform tasks such as handwriting, speaking, or standing.  Psychogenic tremor. These dramatically lessen or disappear when you are distracted. They can happen in people of all ages.  Some types of tremors have no known cause. Tremors can also be a symptom of nervous system problems (neurological disorders) that may occur with aging. Some tremors go away with treatment while others do not. Follow these instructions at home: Watch your tremor for any changes. The following actions may help to lessen any discomfort you are feeling:  Take medicines only as directed by your health care provider.  Limit alcohol intake to no more than 1 drink per day for nonpregnant women and 2 drinks per day for men. One drink equals 12 oz of beer, 5 oz of wine, or 1 oz of hard liquor.  Do not use any tobacco products, including cigarettes, chewing tobacco, or electronic cigarettes. If  you need help quitting, ask your health care provider.  Avoid extreme heat or cold.  Limit the amount of caffeine you consumeas directed by your health care provider.  Try to get 8 hours of sleep each night.  Find ways to manage your stress, such as meditation or yoga.  Keep all follow-up visits as directed by your health care provider. This is important.  Contact a health care provider if:  You start having a tremor after starting a new medicine.  You have tremor with other symptoms such as: ? Numbness. ? Tingling. ? Pain. ? Weakness.  Your tremor gets worse.  Your tremor interferes with your day-to-day life. This information is not intended to replace advice given to you by your health care provider. Make sure you discuss any questions you have with your health care provider. Document Released: 06/24/2002 Document Revised: 03/06/2016 Document Reviewed: 12/30/2013 Elsevier Interactive Patient Education  [employer].

## 2018-06-06 ENCOUNTER — Telehealth: Payer: Self-pay | Admitting: Internal Medicine

## 2018-06-06 NOTE — Telephone Encounter (Signed)
Copied from [place] [id number]. Topic: General - Other >> Jun 06, 2018  1:59 PM Janace Aris A wrote: Reason for CRM: pt called in to have her lab results disclosed, she says if someone can call her back in the next 15 mins would be great.

## 2018-06-06 NOTE — Progress Notes (Signed)
Donna Manning was seen today in the movement disorders clinic for neurologic consultation at the request of [name], [name], MD.  The consultation is for the evaluation of head tremor with fam hx of PD.    Tremor: Yes.     How long has it been going on? Unknown.  It comes and goes  She does note that her daughter mentioned it to her in church a few years ago but then she forgot about it.  She states that family has mentioned it to her intermittently since then.  It seems to come and go  When is it noted the most?  unknown  Fam hx of tremor?  Yes.  father with PD; paternal aunt with PD - "that is why I am sitting here"  Located where?  Head - "its a jerk"; sometimes L hand will shake  Affected by caffeine:  No. (1 coke per day)  Affected by alcohol:  Doesn't drink  Affected by stress:  No.  Affected by fatigue:  Yes.    Tremor inducing meds:  Yes.   albuterol (doesn't use much)  Other Specific Symptoms:  Voice: no change per pt Sleep: sleeps well  Vivid Dreams:  No.  Acting out dreams:  No. Wet Pillows: No. Postural symptoms:  No.  Falls?  No. Bradykinesia symptoms: slow movements Loss of smell:  Perhaps a little Loss of taste:  Yes.   Urinary Incontinence:  Urgency but doesn't want medication Difficulty Swallowing:  No. Handwriting, micrographia: No. Trouble with ADL's:  No.  Trouble buttoning clothing: No. Depression:  No. but stress over children Memory changes:  No. Hallucinations:  No.  visual distortions: No. N/V:  No. Lightheaded:  No.  Syncope: No. Diplopia:  No. Dyskinesia:  No.  Neuroimaging of the brain has not previously been performed.    PREVIOUS MEDICATIONS: none to date  ALLERGIES:   Allergies  Allergen Reactions  . Contrast Media [Iodinated Diagnostic Agents] Hives and Shortness Of Breath  . Actonel [Risedronate Sodium] Other (See Comments)    HIP PAIN  . Starch Rash    IN LATEX GLOVES    CURRENT MEDICATIONS:  Outpatient Encounter  Medications as of 06/07/2018  Medication Sig  . famotidine (PEPCID) 20 MG tablet Take 20 mg by mouth at bedtime.  Marland Kitchen levothyroxine (SYNTHROID, LEVOTHROID) 88 MCG tablet Take 1 tablet (88 mcg total) by mouth daily.  . tamoxifen (NOLVADEX) 20 MG tablet Take 1 tablet (20 mg total) by mouth daily.  . [DISCONTINUED] albuterol (PROVENTIL HFA;VENTOLIN HFA) 108 (90 Base) MCG/ACT inhaler Inhale 2 puffs into the lungs every 4 (four) hours as needed for wheezing or shortness of breath.   No facility-administered encounter medications on file as of 06/07/2018.     PAST MEDICAL HISTORY:   Past Medical History:  Diagnosis Date  . 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 Barrett's esophagus 2009  . Benign head tremor   . Breast cancer, right breast ([employer]) 01/2014   "had 5 weeks radiation after lumpectomy" (04/24/2018)  . Dental bridge present    lower left  . Dental crowns present   . GERD (gastroesophageal reflux disease)   . History of DVT (deep vein thrombosis) ~ 1980   after a pregnancy  . History of radiation therapy 2015  . Hypothyroidism   . Irregular heart beat    "dx'd by 1 dr; not noted by the cardiologist" (04/24/2018)  . NSAID-induced gastric ulcer   . PONV (postoperative nausea and  vomiting)     PAST SURGICAL HISTORY:   Past Surgical History:  Procedure Laterality Date  . BREAST BIOPSY Right 1991  . BREAST LUMPECTOMY Right 1991  . BREAST LUMPECTOMY W/ NEEDLE LOCALIZATION Left 04/24/2018  . BREAST LUMPECTOMY WITH NEEDLE LOCALIZATION Left 04/24/2018   Procedure: LEFT BREAST LUMPECTOMY WITH NEEDLE LOCALIZATION;  Surgeon: [name], MD;  Location: [place];  Service: General;  Laterality: Left;  . COLONOSCOPY     "w/hemorrhoid banding"  . PARTIAL MASTECTOMY WITH NEEDLE LOCALIZATION AND AXILLARY SENTINEL LYMPH NODE BX Right 01/20/2014   Procedure: RIGHT PARTIAL MASTECTOMY WITH DOUBLE  NEEDLE LOCALIZATION (BRACKETED )AND AXILLARY SENTINEL LYMPH  NODE BX;  Surgeon: [name], MD;  Location: [place];  Service: General;  Laterality: Right;  And [place].  Marland Kitchen RE-EXCISION OF BREAST CANCER,SUPERIOR MARGINS Right 02/05/2014   Procedure: RIGHT LUMPECTOMY, RE-EXCISION OF BREAST CANCER,SUPERIOR MARGINS;  Surgeon: [name], MD;  Location: [place];  Service: General;  Laterality: Right;  . UPPER GI ENDOSCOPY  12/25/2014   with Propofol    SOCIAL HISTORY:   Social History   Socioeconomic History  . Marital status: Married    Spouse name: Not on file  . Number of children: 5  . Years of education: Not on file  . Highest education level: Not on file  Occupational History  . Occupation: homemaker  Social Needs  . Financial resource strain: Not on file  . Food insecurity:    Worry: Not on file    Inability: Not on file  . Transportation needs:    Medical: Not on file    Non-medical: Not on file  Tobacco Use  . Smoking status: Never Smoker  . Smokeless tobacco: Never Used  Substance and Sexual Activity  . Alcohol use: Not Currently    Comment: 04/24/2018 "drank a few beers in college"  . Drug use: Never  . Sexual activity: Not Currently  Lifestyle  . Physical activity:    Days per week: Not on file    Minutes per session: Not on file  . Stress: Not on file  Relationships  . Social connections:    Talks on phone: Not on file    Gets together: Not on file    Attends religious service: Not on file    Active member of club or organization: Not on file    Attends meetings of clubs or organizations: Not on file    Relationship status: Not on file  . Intimate partner violence:    Fear of current or ex partner: Not on file    Emotionally abused: Not on file    Physically abused: Not on file    Forced sexual activity: Not on file  Other Topics Concern  . Not on file  Social History Narrative  . Not on file    FAMILY HISTORY:   Family Status  Relation Name Status  . Mother   Deceased  . Father  Deceased  . Sister 2 Alive  . Other  (Not Specified)       neurological disorder   . Brother 4 Alive  . PGF  (Not Specified)  . Child 5 Alive    ROS:  Review of Systems  Constitutional: Negative.   HENT: Negative.   Eyes: Negative.   Respiratory: Positive for cough (almost gone - getting over PNA).   Gastrointestinal: Negative.   Genitourinary: Positive for urgency.  Musculoskeletal: Negative.   Skin: Negative.   Endo/Heme/Allergies:  Negative.   Psychiatric/Behavioral: Negative.     PHYSICAL EXAMINATION:    VITALS:   Vitals:   06/07/18 0919  BP: 138/78  Pulse: 78  SpO2: 97%  Weight: 182 lb (82.6 kg)  Height: 5\' 7"  (1.702 m)    GEN:  The patient appears stated age and is in NAD. HEENT:  Normocephalic, atraumatic.  The mucous membranes are moist. The superficial temporal arteries are without ropiness or tenderness. CV:  RRR Lungs:  CTAB Neck/HEME:  There are no carotid bruits bilaterally.  Head/neck is turned to the left.  There is really no hypertrophy of the sternocleidomastoid.  Neurological examination:  Orientation: The patient is alert and oriented x3. Fund of knowledge is appropriate.  Recent and remote memory are intact.  Attention and concentration are normal.    Able to name objects and repeat phrases. Cranial nerves: There is good facial symmetry. Pupils are equal round and reactive to light bilaterally. Fundoscopic exam reveals clear margins bilaterally. Extraocular muscles are intact. The visual fields are full to confrontational testing. The speech is fluent and clear. Soft palate rises symmetrically and there is no tongue deviation. Hearing is intact to conversational tone. Sensation: Sensation is intact to light and pinprick throughout (facial, trunk, extremities). Vibration is intact at the bilateral big toe. There is no extinction with double simultaneous stimulation. There is no sensory dermatomal level identified. Motor: Strength is  5/5 in the bilateral upper and lower extremities.   Shoulder shrug is equal and symmetric.  There is no pronator drift. Deep tendon reflexes: Deep tendon reflexes are 2/4 at the bilateral biceps, triceps, brachioradialis, patella and achilles. Plantar responses are downgoing bilaterally.  Movement examination: Tone: There is normal tone in the bilateral upper extremities.  The tone in the lower extremities is normal.  Abnormal movements: no rest or intention tremor.  There is irregular, rare head tremor. Coordination:  There is no decremation with RAM's, with any form of RAMS, including alternating supination and pronation of the forearm, hand opening and closing, finger taps, heel taps and toe taps. Gait and Station: The patient has no difficulty arising out of a deep-seated chair without the use of the hands. The patient's stride length is normal.      Chemistry      Component Value Date/Time   NA 141 06/04/2018 0924   NA 141 09/08/2016 1007   K 4.1 06/04/2018 0924   K 4.3 09/08/2016 1007   CL 105 06/04/2018 0924   CO2 27 06/04/2018 0924   CO2 26 09/08/2016 1007   BUN 12 06/04/2018 0924   BUN 12.4 09/08/2016 1007   CREATININE 0.83 06/04/2018 0924   CREATININE 0.8 09/08/2016 1007      Component Value Date/Time   CALCIUM 9.3 06/04/2018 0924   CALCIUM 9.1 09/08/2016 1007   ALKPHOS 35 (L) 06/04/2018 0924   ALKPHOS 35 (L) 09/08/2016 1007   AST 16 06/04/2018 0924   AST 18 09/08/2016 1007   ALT 12 06/04/2018 0924   ALT 17 09/08/2016 1007   BILITOT 0.4 06/04/2018 0924   BILITOT 0.43 09/08/2016 1007     Lab Results  Component Value Date   TSH 1.81 06/04/2018       ASSESSMENT/PLAN:  1.  Cervical Dystonia  -I talked to the patient about the nature and pathophysiology.  The patient is having trouble with ADL's and with rotation of the head in daily life for driving.  The primary muscles involved are the R SCM and L splenius capitus.  We talked about treatments.  We talked about  the value of botox.  The patient was educated on the botulinum toxin the black blox warning and given a copy of the botox patient medication guide.  The patient understands that this warning states that there have been reported cases of the Botox extending beyond the injection site and creating adverse effects, similar to those of botulism. This included loss of strength, trouble walking, hoarseness, trouble saying words clearly, loss of bladder control, trouble breathing, trouble swallowing, diplopia, blurry vision and ptosis. Most of the distant spread of Botox was happening in patients, primarily children, who received medication for spasticity or for cervical dystonia. The patients dystonia is very mild and she wishes to hold off on Botox treatments.  I tend to agree with her.  She was given patient education materials.  -Patient was reassured that I saw no evidence of Parkinson's disease today.  She was worried about that because of a family history.  2.  At this point, I will see the patient back on an as-needed basis.  She can certainly let me know if she needs me or if she changes her mind on Botox, or if new neurologic issues arise.    Cc:  [name], [name], MD

## 2018-06-07 ENCOUNTER — Ambulatory Visit (INDEPENDENT_AMBULATORY_CARE_PROVIDER_SITE_OTHER): Payer: Medicare Other | Admitting: Neurology

## 2018-06-07 ENCOUNTER — Encounter: Payer: Self-pay | Admitting: Neurology

## 2018-06-07 VITALS — BP 138/78 | HR 78 | Ht 67.0 in | Wt 182.0 lb

## 2018-06-07 DIAGNOSIS — G243 Spasmodic torticollis: Secondary | ICD-10-CM | POA: Diagnosis not present

## 2018-07-20 ENCOUNTER — Telehealth: Payer: Self-pay | Admitting: Internal Medicine

## 2018-07-20 NOTE — Telephone Encounter (Signed)
Please refill today x 3

## 2018-07-20 NOTE — Telephone Encounter (Signed)
Copied from [place] [id number]. Topic: Quick Communication - Rx Refill/Question >> Jul 20, 2018  9:57 AM Scherrie Gerlach wrote: Medication: valACYclovir (VALTREX) 1000 MG tablet   Pt states she is getting outbreak on her lip and if she does not start this right away, it gets really bad. Hopes Dr Regis Bill will approve and send to local pharmacy.  [id number] - [place], [address] [phone] (Phone) [phone] (Fax)

## 2018-07-20 NOTE — Telephone Encounter (Signed)
Last refilled 05/24/17 #30 x 1 refill Last OV 06/04/18  Please advise Dr Regis Bill, thanks.

## 2018-07-23 MED ORDER — VALACYCLOVIR HCL 1 G PO TABS: 2000.0000 mg | ORAL_TABLET | Freq: Two times a day (BID) | ORAL | 2 refills | Status: DC | PRN

## 2018-07-23 NOTE — Telephone Encounter (Signed)
Pt aware that Valtrex has been refilled.  Nothing further needed.

## 2018-09-13 ENCOUNTER — Other Ambulatory Visit: Payer: Self-pay | Admitting: *Deleted

## 2018-09-13 MED ORDER — TAMOXIFEN CITRATE 20 MG PO TABS: 20.0000 mg | ORAL_TABLET | Freq: Every day | ORAL | 3 refills | Status: DC

## 2018-10-02 ENCOUNTER — Telehealth: Payer: Self-pay

## 2018-10-02 NOTE — Telephone Encounter (Signed)
Pt coming in tomorrow for annual follow up with tamoxifen. Pt would like to reschedule d/t current covid concerns. Pt rescheduled in 3 months. Pt doing well and tolerates tamoxifen well. No new concern and has recently had her MM at the breast center. Confirmed new time/date.

## 2018-10-03 ENCOUNTER — Inpatient Hospital Stay: Payer: Medicare Other | Admitting: Hematology and Oncology

## 2018-11-12 ENCOUNTER — Other Ambulatory Visit: Payer: Self-pay | Admitting: Gastroenterology

## 2018-11-12 MED ORDER — FAMOTIDINE 20 MG PO TABS: 20.0000 mg | ORAL_TABLET | Freq: Every day | ORAL | 0 refills | Status: DC

## 2018-11-12 NOTE — Telephone Encounter (Signed)
Prescription sent to patient's pharmacy and patient notified to make an appt for further refills.

## 2018-11-14 ENCOUNTER — Telehealth: Payer: Self-pay

## 2018-11-14 MED ORDER — FAMOTIDINE 20 MG PO TABS: 20.0000 mg | ORAL_TABLET | Freq: Two times a day (BID) | ORAL | 0 refills | Status: DC

## 2018-11-14 NOTE — Telephone Encounter (Signed)
Patient is concerned about more chest pain at night , she thinks its heartburn. She said she is taking the generic pepcid twice a day and needs a 90 day supply sent in due to cost. I resent the rx and she will have her visit with Dr Silverio Decamp next week via [facility].

## 2018-11-20 ENCOUNTER — Telehealth: Payer: Self-pay | Admitting: *Deleted

## 2018-11-20 NOTE — Telephone Encounter (Signed)
Pt called wanting to know if being on Tamoxifen makes her more vulnerable to contracting covid 19.  Explained to pt that being on Tamoxifen does not affect her immune system and does not make her more vulnerable to contracting covid 19.  Pt verbalizes understanding.

## 2018-11-22 ENCOUNTER — Encounter: Payer: Self-pay | Admitting: Gastroenterology

## 2018-11-22 ENCOUNTER — Other Ambulatory Visit: Payer: Self-pay

## 2018-11-22 ENCOUNTER — Ambulatory Visit (INDEPENDENT_AMBULATORY_CARE_PROVIDER_SITE_OTHER): Payer: Medicare Other | Admitting: Gastroenterology

## 2018-11-22 VITALS — Ht 67.0 in | Wt 182.0 lb

## 2018-11-22 DIAGNOSIS — Z1211 Encounter for screening for malignant neoplasm of colon: Secondary | ICD-10-CM

## 2018-11-22 DIAGNOSIS — K219 Gastro-esophageal reflux disease without esophagitis: Secondary | ICD-10-CM

## 2018-11-22 DIAGNOSIS — R0789 Other chest pain: Secondary | ICD-10-CM | POA: Diagnosis not present

## 2018-11-22 MED ORDER — OMEPRAZOLE 20 MG PO CPDR: 20.0000 mg | DELAYED_RELEASE_CAPSULE | Freq: Every day | ORAL | 3 refills | Status: DC

## 2018-11-22 NOTE — Patient Instructions (Addendum)
Omeprazole 20 mg daily, 30 minutes before breakfast.  [facility]  Continue to use famotidine 20 mg at bedtime as needed  Antireflux measures  Please contact PMD for further work-up of left-sided chest pain and left shoulder discomfort, exclude angina though symptoms are atypical for angina  Due for colorectal cancer screening, will need to schedule colonoscopy next available appointment after she is cleared by PMD, please contact the office to get this scheduled once you are cleared  [phone]  Follow-up tele visit in 4 to 6 weeks, please call back for this appointment    Gastroesophageal Reflux Disease, Adult Gastroesophageal reflux (GER) happens when acid from the stomach flows up into the tube that connects the mouth and the stomach (esophagus). Normally, food travels down the esophagus and stays in the stomach to be digested. However, when a person has GER, food and stomach acid sometimes move back up into the esophagus. If this becomes a more serious problem, the person may be diagnosed with a disease called gastroesophageal reflux disease (GERD). GERD occurs when the reflux:  Happens often.  Causes frequent or severe symptoms.  Causes problems such as damage to the esophagus. When stomach acid comes in contact with the esophagus, the acid may cause soreness (inflammation) in the esophagus. Over time, GERD may create small holes (ulcers) in the lining of the esophagus. What are the causes? This condition is caused by a problem with the muscle between the esophagus and the stomach (lower esophageal sphincter, or LES). Normally, the LES muscle closes after food passes through the esophagus to the stomach. When the LES is weakened or abnormal, it does not close properly, and that allows food and stomach acid to go back up into the esophagus. The LES can be weakened by certain dietary substances, medicines, and medical conditions, including:  Tobacco  use.  Pregnancy.  Having a hiatal hernia.  Alcohol use.  Certain foods and beverages, such as coffee, chocolate, onions, and peppermint. What increases the risk? You are more likely to develop this condition if you:  Have an increased body weight.  Have a connective tissue disorder.  Use NSAID medicines. What are the signs or symptoms? Symptoms of this condition include:  Heartburn.  Difficult or painful swallowing.  The feeling of having a lump in the throat.  Abitter taste in the mouth.  Bad breath.  Having a large amount of saliva.  Having an upset or bloated stomach.  Belching.  Chest pain. Different conditions can cause chest pain. Make sure you see your health care provider if you experience chest pain.  Shortness of breath or wheezing.  Ongoing (chronic) cough or a night-time cough.  Wearing away of tooth enamel.  Weight loss. How is this diagnosed? Your health care provider will take a medical history and perform a physical exam. To determine if you have mild or severe GERD, your health care provider may also monitor how you respond to treatment. You may also have tests, including:  A test to examine your stomach and esophagus with a small camera (endoscopy).  A test thatmeasures the acidity level in your esophagus.  A test thatmeasures how much pressure is on your esophagus.  A barium swallow or modified barium swallow test to show the shape, size, and functioning of your esophagus. How is this treated? The goal of treatment is to help relieve your symptoms and to prevent complications. Treatment for this condition may vary depending on how severe your symptoms are.  Your health care provider may recommend:  Changes to your diet.  Medicine.  Surgery. Follow these instructions at home: Eating and drinking   Follow a diet as recommended by your health care provider. This may involve avoiding foods and drinks such as: ? Coffee and tea (with  or without caffeine). ? Drinks that containalcohol. ? Energy drinks and sports drinks. ? Carbonated drinks or sodas. ? Chocolate and cocoa. ? Peppermint and mint flavorings. ? Garlic and onions. ? Horseradish. ? Spicy and acidic foods, including peppers, chili powder, curry powder, vinegar, hot sauces, and barbecue sauce. ? Citrus fruit juices and citrus fruits, such as oranges, lemons, and limes. ? Tomato-based foods, such as red sauce, chili, salsa, and pizza with red sauce. ? Fried and fatty foods, such as donuts, french fries, potato chips, and high-fat dressings. ? High-fat meats, such as hot dogs and fatty cuts of red and white meats, such as rib eye steak, sausage, ham, and bacon. ? High-fat dairy items, such as whole milk, butter, and cream cheese.  Eat small, frequent meals instead of large meals.  Avoid drinking large amounts of liquid with your meals.  Avoid eating meals during the 2-3 hours before bedtime.  Avoid lying down right after you eat.  Do not exercise right after you eat. Lifestyle   Do not use any products that contain nicotine or tobacco, such as cigarettes, e-cigarettes, and chewing tobacco. If you need help quitting, ask your health care provider.  Try to reduce your stress by using methods such as yoga or meditation. If you need help reducing stress, ask your health care provider.  If you are overweight, reduce your weight to an amount that is healthy for you. Ask your health care provider for guidance about a safe weight loss goal. General instructions  Pay attention to any changes in your symptoms.  Take over-the-counter and prescription medicines only as told by your health care provider. Do not take aspirin, ibuprofen, or other NSAIDs unless your health care provider told you to do so.  Wear loose-fitting clothing. Do not wear anything tight around your waist that causes pressure on your abdomen.  Raise (elevate) the head of your bed about 6  inches (15 cm).  Avoid bending over if this makes your symptoms worse.  Keep all follow-up visits as told by your health care provider. This is important. Contact a health care provider if:  You have: ? New symptoms. ? Unexplained weight loss. ? Difficulty swallowing or it hurts to swallow. ? Wheezing or a persistent cough. ? A hoarse voice.  Your symptoms do not improve with treatment. Get help right away if you:  Have pain in your arms, neck, jaw, teeth, or back.  Feel sweaty, dizzy, or light-headed.  Have chest pain or shortness of breath.  Vomit and your vomit looks like blood or coffee grounds.  Faint.  Have stool that is bloody or black.  Cannot swallow, drink, or eat. Summary  Gastroesophageal reflux happens when acid from the stomach flows up into the esophagus. GERD is a disease in which the reflux happens often, causes frequent or severe symptoms, or causes problems such as damage to the esophagus.  Treatment for this condition may vary depending on how severe your symptoms are. Your health care provider may recommend diet and lifestyle changes, medicine, or surgery.  Contact a health care provider if you have new or worsening symptoms.  Take over-the-counter and prescription medicines only as told by your health care provider.  Do not take aspirin, ibuprofen, or other NSAIDs unless your health care provider told you to do so.  Keep all follow-up visits as told by your health care provider. This is important. This information is not intended to replace advice given to you by your health care provider. Make sure you discuss any questions you have with your health care provider. Document Released: 04/13/2005 Document Revised: 01/10/2018 Document Reviewed: 01/10/2018 Elsevier Interactive Patient Education  2019 [employer].   I appreciate the  opportunity to care for you  Thank You   Harl Bowie , MD

## 2018-11-22 NOTE — Progress Notes (Signed)
Donna Manning    [id number]    07/14/1951  Primary Care Physician:[name], [name], MD  Referring Physician: Burnis Medin, MD [place], [place] [address]  This service was provided via audio  Only ([employer]) due to [place] 19 pandemic.  Patient location: Home Provider location: Office Used 2 patient identifiers to confirm the correct person. Explained the limitations in evaluation and management via telemedicine. Patient is aware of potential medical charges for this visit.  Patient consented to this virtual visit.  The persons participating in this telemedicine service were myself and the patient  Interactive audio and video telecommunications were attempted between this provider and patient, however failed, due to patient having technical difficulties OR patient did not have access to video capability. We continued and completed visit with audio only.    Chief complaint: GERD  HPI:  68 year old female with history of chronic GERD.  Last visit March 2018.  EGD June 2016 by Dr. Deatra Ina very short segment of salmon-pink mucosa at squamocolumnar junction, 1 cm long biopsies negative for intestinal metaplasia or Barrett's esophagus  Previously she had multiple EGD at [facility], was reportedly diagnosed with Barrett's esophagus but on review of EGD reports and pathology no mention of intestinal metaplasia or Barrett's esophagus.  She had erosive esophagitis in distal esophagus  Colonoscopy 2009 with removal of 7 mm polyp from rectum, pathology report not available.  Prior to that she had 2 additional colonoscopies.   Heartburn worse at night when she lays down, is taking famotidine, doesn't think its helping  No dysphagia, odynopagia, nausea, vomiting, change in bowel habits, rectal bleeding or melena  No chest pain or sob on exertion  Other medical history breast cancer June 2015 status post lumpectomy and adjuvant radiation    Outpatient Encounter Medications as of 11/22/2018  Medication Sig  . famotidine (PEPCID) 20 MG tablet Take 1 tablet (20 mg total) by mouth 2 (two) times daily.  Marland Kitchen levothyroxine (SYNTHROID, LEVOTHROID) 88 MCG tablet Take 1 tablet (88 mcg total) by mouth daily.  . tamoxifen (NOLVADEX) 20 MG tablet Take 1 tablet (20 mg total) by mouth daily.  . valACYclovir (VALTREX) 1000 MG tablet Take 2 tablets (2,000 mg total) by mouth 2 (two) times daily as needed.   No facility-administered encounter medications on file as of 11/22/2018.     Allergies as of 11/22/2018 - Review Complete 06/07/2018  Allergen Reaction Noted  . Contrast media [iodinated diagnostic agents] Hives and Shortness Of Breath 01/13/2014  . Actonel [risedronate sodium] Other (See Comments) 12/03/2011  . Starch Rash 01/01/2014    Past Medical History:  Diagnosis Date  . 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 Barrett's esophagus 2009  . Benign head tremor   . Breast cancer, right breast ([place]) 01/2014   "had 5 weeks radiation after lumpectomy" (04/24/2018)  . Dental bridge present    lower left  . Dental crowns present   . GERD (gastroesophageal reflux disease)   . History of DVT (deep vein thrombosis) ~ 1980   after a pregnancy  . History of radiation therapy 2015  . Hypothyroidism   . Irregular heart beat    "dx'd by 1 dr; not noted by the cardiologist" (04/24/2018)  . NSAID-induced gastric ulcer   . PONV (postoperative nausea and vomiting)     Past Surgical History:  Procedure Laterality Date  . BREAST BIOPSY Right 1991  . BREAST  LUMPECTOMY Right 1991  . BREAST LUMPECTOMY W/ NEEDLE LOCALIZATION Left 04/24/2018  . BREAST LUMPECTOMY WITH NEEDLE LOCALIZATION Left 04/24/2018   Procedure: LEFT BREAST LUMPECTOMY WITH NEEDLE LOCALIZATION;  Surgeon: [name], MD;  Location: [facility];  Service: General;  Laterality: Left;  . COLONOSCOPY     "w/hemorrhoid banding"  . PARTIAL  MASTECTOMY WITH NEEDLE LOCALIZATION AND AXILLARY SENTINEL LYMPH NODE BX Right 01/20/2014   Procedure: RIGHT PARTIAL MASTECTOMY WITH DOUBLE  NEEDLE LOCALIZATION (BRACKETED )AND AXILLARY SENTINEL LYMPH NODE BX;  Surgeon: [name], MD;  Location: [facility];  Service: General;  Laterality: Right;  And [place].  Marland Kitchen RE-EXCISION OF BREAST CANCER,SUPERIOR MARGINS Right 02/05/2014   Procedure: RIGHT LUMPECTOMY, RE-EXCISION OF BREAST CANCER,SUPERIOR MARGINS;  Surgeon: [name], MD;  Location: [facility];  Service: General;  Laterality: Right;  . UPPER GI ENDOSCOPY  12/25/2014   with Propofol    Family History  Problem Relation Age of Onset  . Heart failure Mother 45  . Diabetes Mother   . Parkinsonism Father 1  . Stroke Father   . Thyroid disease Sister   . Prostate cancer Brother   . Stomach cancer Paternal Grandfather     Social History   Socioeconomic History  . Marital status: Married    Spouse name: Not on file  . Number of children: 5  . Years of education: Not on file  . Highest education level: Not on file  Occupational History  . Occupation: homemaker  Social Needs  . Financial resource strain: Not on file  . Food insecurity:    Worry: Not on file    Inability: Not on file  . Transportation needs:    Medical: Not on file    Non-medical: Not on file  Tobacco Use  . Smoking status: Never Smoker  . Smokeless tobacco: Never Used  Substance and Sexual Activity  . Alcohol use: Not Currently    Comment: 04/24/2018 "drank a few beers in college"  . Drug use: Never  . Sexual activity: Not Currently  Lifestyle  . Physical activity:    Days per week: Not on file    Minutes per session: Not on file  . Stress: Not on file  Relationships  . Social connections:    Talks on phone: Not on file    Gets together: Not on file    Attends religious service: Not on file    Active member of club or organization: Not on file    Attends meetings  of clubs or organizations: Not on file    Relationship status: Not on file  . Intimate partner violence:    Fear of current or ex partner: Not on file    Emotionally abused: Not on file    Physically abused: Not on file    Forced sexual activity: Not on file  Other Topics Concern  . Not on file  Social History Narrative  . Not on file      Review of systems: Review of Systems as per HPI All other systems reviewed and are negative.   Physical Exam: Vitals were not taken and physical exam was not performed during this virtual visit.  Data Reviewed:  Reviewed labs, radiology imaging, old records and pertinent past GI work up   Assessment and Plan/Recommendations:   68 year old female with complaints of worsening heartburn and atypical chest pain Her symptoms are significantly worse at bedtime  Start omeprazole 20 mg daily, 30 minutes before  breakfast Continue Pepcid 20 mg at bedtime as needed Discussed antireflux measures and lifestyle modifications in detail  Atypical chest pain likely secondary to uncontrolled GERD but will need to exclude angina/cardiovascular disease given her age and comorbidities  Advised patient to contact PMD for further work-up of atypical chest pain and rule out cardiac etiology  Last colonoscopy 2009, due for colorectal cancer screening.  Will schedule it once patient had cardiac work-up for chest pain and is cleared by PMD.  Follow-up telemedicine visit in 4 to 6 weeks    K. Denzil Magnuson , MD   CC: [name], [name], MD

## 2018-11-22 NOTE — Progress Notes (Signed)
Chief Complaint  Patient presents with   Chest Pain    for a couple months mostly at night with some pain in upper left arm. pt believes it is heart burn sometimes radiates to jaw also had breast surgery in november     HPI: Donna Manning 68 y.o. come in for acute in office visit   Referred in by Dr.  Silverio Decamp for atypical chest pain  Seen yesterday  And  rxed  son acid suppression  "Said need to listen to heart "   Onset  Of left sided  Cp lasting minutes at night about 3 x per week that awakens her  occurring for  Months off and on   Left chest discomfort  And ocass jaw pain  She attributes to jaw clenching    And then  Also  has had some  nd left upper arm pain localized like an ache  Localized  No shoulder injury or dec rom     Clenching   jaw.   Climb stairs and mow lawn . And does not have any of theses sx  And noted no  Change in exercise tolerance   Has had some of this over the  years but now more frequent nore intense.  awakes   In sleep . About half way through the night   She sits up and goes away?   Last last minutes    Shoulder   Left upper arm pain discomfort     Uncertain how long seems newer   Stress tests echo  2012  For poss arrhythmia       Surgery  Left  Breast  Last fall and pna    Resolved on right   October 2019  Hx breast cancer right   rx    rx radiation  On right and no chemo   ROS: See pertinent positives and negatives per HPI.Wild dreams.  Not sure why  fam hx  Mom had pain she felt from heart lived to 71s and no heart dx   Past Medical History:  Diagnosis Date   Atypical lobular hyperplasia (ALH) of left breast 03/2018   Barrett's esophagus 2009   Benign head tremor    Breast cancer, right breast ([facility]) 01/2014   "had 5 weeks radiation after lumpectomy" (04/24/2018)   Dental bridge present    lower left   Dental crowns present    GERD (gastroesophageal reflux disease)    History of DVT (deep vein thrombosis) ~ 1980   after a  pregnancy   History of radiation therapy 2015   Hypothyroidism    Irregular heart beat    "dx'd by 1 dr; not noted by the cardiologist" (04/24/2018)   NSAID-induced gastric ulcer    PONV (postoperative nausea and vomiting)     Family History  Problem Relation Age of Onset   Heart failure Mother 33   Diabetes Mother    Parkinsonism Father 104   Stroke Father    Thyroid disease Sister    Prostate cancer Brother    Stomach cancer Paternal Grandfather     Social History   Socioeconomic History   Marital status: Married    Spouse name: Not on file   Number of children: 5   Years of education: Not on file   Highest education level: Not on file  Occupational History   Occupation: homemaker  Social Needs   Financial resource strain: Not on file   Food insecurity:  Worry: Not on file    Inability: Not on file   Transportation needs:    Medical: Not on file    Non-medical: Not on file  Tobacco Use   Smoking status: Never Smoker   Smokeless tobacco: Never Used  Substance and Sexual Activity   Alcohol use: Not Currently    Comment: 04/24/2018 "drank a few beers in college"   Drug use: Never   Sexual activity: Not Currently  Lifestyle   Physical activity:    Days per week: Not on file    Minutes per session: Not on file   Stress: Not on file  Relationships   Social connections:    Talks on phone: Not on file    Gets together: Not on file    Attends religious service: Not on file    Active member of club or organization: Not on file    Attends meetings of clubs or organizations: Not on file    Relationship status: Not on file  Other Topics Concern   Not on file  Social History Narrative   Not on file    Outpatient Medications Prior to Visit  Medication Sig Dispense Refill   famotidine (PEPCID) 20 MG tablet Take 1 tablet (20 mg total) by mouth 2 (two) times daily. 180 tablet 0   levothyroxine (SYNTHROID, LEVOTHROID) 88 MCG tablet  Take 1 tablet (88 mcg total) by mouth daily. 90 tablet 3   omeprazole (PRILOSEC) 20 MG capsule Take 1 capsule (20 mg total) by mouth daily. 30 minutes before breakfast 90 capsule 3   tamoxifen (NOLVADEX) 20 MG tablet Take 1 tablet (20 mg total) by mouth daily. 90 tablet 3   valACYclovir (VALTREX) 1000 MG tablet Take 2 tablets (2,000 mg total) by mouth 2 (two) times daily as needed. 30 tablet 2   No facility-administered medications prior to visit.      EXAM:  BP 130/78 (BP Location: Right Arm, Patient Position: Sitting, Cuff Size: Large)    Pulse 80    Temp 98.2 F (36.8 C) (Oral)    Wt 183 lb (83 kg)    SpO2 98%    BMI 28.66 kg/m   Body mass index is 28.66 kg/m. Wt Readings from Last 3 Encounters:  11/23/18 183 lb (83 kg)  11/22/18 182 lb (82.6 kg)  06/07/18 182 lb (82.6 kg)    GENERAL: vitals reviewed and listed above, alert, oriented, appears well hydrated and in no acute distress masked  HEENT: atraumatic, conjunctiva  clear, no obvious abnormalities on inspection of external nose and ears tm clear OP : no lesion edema or exudate  NECK: no obvious masses on inspection palpation  LUNGS: clear to auscultation bilaterally, no wheezes, rales or rhonchi, good air movement CV: HRRR, no clubbing cyanosis or  peripheral edema nl cap refill  Breast no nodule  And no local tenderness  Axilla is clear  MS: moves all extremities without noticeable focal  Abnormality no shoulder   LOM  Left upper arm points to  proxila lateral humerus as area of ache   PSYCH: pleasant and cooperative, no obvious depression or anxiety Lab Results  Component Value Date   WBC 4.9 06/04/2018   HGB 12.7 06/04/2018   HCT 37.9 06/04/2018   PLT 253.0 06/04/2018   GLUCOSE 103 (H) 06/04/2018   CHOL 200 06/04/2018   TRIG 200.0 (H) 06/04/2018   HDL 49.20 06/04/2018   LDLDIRECT 147.2 11/23/2011   LDLCALC 110 (H) 06/04/2018   ALT 12 06/04/2018  AST 16 06/04/2018   NA 141 06/04/2018   K 4.1 06/04/2018   CL  105 06/04/2018   CREATININE 0.83 06/04/2018   BUN 12 06/04/2018   CO2 27 06/04/2018   TSH 1.81 06/04/2018   HGBA1C 6.0 06/04/2018   BP Readings from Last 3 Encounters:  11/23/18 130/78  06/07/18 138/78  06/04/18 116/84   ekg today nsr NAD normal ( having left arm pain today )  ASSESSMENT AND PLAN:  Discussed the following assessment and plan:  Chest pain in adult left   - Plan: EKG 12-Lead, DG Chest 2 View, DG Shoulder Left  Atypical chest pain - Plan: DG Chest 2 View, DG Shoulder Left  Left upper arm pain - cannot ell if referred   or primary pain - Plan: DG Chest 2 View, DG Shoulder Left  Malignant neoplasm of upper-outer quadrant of right female breast, unspecified estrogen receptor status (HCC) Curious sx   Area of pain in cardiac region but context and function are not cw anginal  Sx. And more with poss esophageal  Other    Get c xray and shoulder x ray (  Rad tech NA today )   And unless causes noted will plan cards consult about advisability of getting stress test or  Opine that  Dr Delane Ginger can proceed with endo without a concern.  -Patient advised to return or notify health care team  if  new concerns arise.  Patient Instructions  Your exam and ekg is reassuring .  Context  Doesn't fit  Heart pain but location is more typical.   Let get chest x ray and left  Shoulder arm x ray  For no surproises and then  Get opinon from the  Cardiology team  Poss a nother stress test  Stay on  Reflux med and if  You get   Shortness of breath inability   Serious change in exrecise ability seek  emregent care .   Come for x ray next week chest and shoulder arm   Nonspecific Chest Pain, Adult Chest pain can be caused by many different conditions. It can be caused by a condition that is life-threatening and requires treatment right away. It can also be caused by something that is not life-threatening. If you have chest pain, it can be hard to know the difference, so it is important to get help  right away to make sure that you do not have a serious condition. Some life-threatening causes of chest pain include:  Heart attack.  A tear in the body's main blood vessel (aortic dissection).  Inflammation around your heart (pericarditis).  A problem in the lungs, such as a blood clot (pulmonary embolism) or a collapsed lung (pneumothorax). Some non life-threatening causes of chest pain include:  Heartburn.  Anxiety or stress.  Damage to the bones, muscles, and cartilage that make up your chest wall.  Pneumonia or bronchitis.  Shingles infection (varicella-zoster virus). Chest pain can feel like:  Pain or discomfort on the surface of your chest or deep in your chest.  Crushing, pressure, aching, or squeezing pain.  Burning or tingling.  Dull or sharp pain that is worse when you move, cough, or take a deep breath.  Pain or discomfort that is also felt in your back, neck, jaw, shoulder, or arm, or pain that spreads to any of these areas. Your chest pain may come and go. It may also be constant. Your health care provider will do lab tests and other studies to  find the cause of your pain. Treatment will depend on the cause of your chest pain. Follow these instructions at home: Medicines  Take over-the-counter and prescription medicines only as told by your health care provider.  If you were prescribed an antibiotic, take it as told by your health care provider. Do not stop taking the antibiotic even if you start to feel better. Lifestyle   Rest as directed by your health care provider.  Do not use any products that contain nicotine or tobacco, such as cigarettes and e-cigarettes. If you need help quitting, ask your health care provider.  Do not drink alcohol.  Make healthy lifestyle choices as recommended. These may include: ? Getting regular exercise. Ask your health care provider to suggest some activities that are safe for you. ? Eating a heart-healthy diet. This  includes plenty of fresh fruits and vegetables, whole grains, low-fat (lean) protein, and low-fat dairy products. A dietitian can help you find healthy eating options. ? Maintaining a healthy weight. ? Managing any other health conditions you have, such as high blood pressure (hypertension) or diabetes. ? Reducing stress, such as with yoga or relaxation techniques. General instructions  Pay attention to any changes in your symptoms. Tell your health care provider about them or any new symptoms.  Avoid any activities that cause chest pain.  Keep all follow-up visits as told by your health care provider. This is important. This includes visits for any further testing if your chest pain does not go away. Contact a health care provider if:  Your chest pain does not go away.  You feel depressed.  You have a fever. Get help right away if:  Your chest pain gets worse.  You have a cough that gets worse, or you cough up blood.  You have severe pain in your abdomen.  You faint.  You have sudden, unexplained chest discomfort.  You have sudden, unexplained discomfort in your arms, back, neck, or jaw.  You have shortness of breath at any time.  You suddenly start to sweat, or your skin gets clammy.  You feel nausea or you vomit.  You suddenly feel lightheaded or dizzy.  You have severe weakness, or unexplained weakness or fatigue.  Your heart begins to beat quickly, or it feels like it is skipping beats. These symptoms may represent a serious problem that is an emergency. Do not wait to see if the symptoms will go away. Get medical help right away. Call your local emergency services (911 in the [place] Do not drive yourself to the hospital. Summary  Chest pain can be caused by a condition that is serious and requires urgent treatment. It may also be caused by something that is not life-threatening.  If you have chest pain, it is very important to see your health care provider. Your  health care provider may do lab tests and other studies to find the cause of your pain.  Follow your health care provider's instructions on taking medicines, making lifestyle changes, and getting emergency treatment if symptoms become worse.  Keep all follow-up visits as told by your health care provider. This includes visits for any further testing if your chest pain does not go away. This information is not intended to replace advice given to you by your health care provider. Make sure you discuss any questions you have with your health care provider. Document Released: 04/13/2005 Document Revised: 01/04/2018 Document Reviewed: 01/04/2018 Elsevier Interactive Patient Education  2019 [place].   Angina  Angina  is extreme discomfort in the chest, neck, arm, jaw or back. The discomfort is caused by a lack of blood in the middle layer of the heart wall (myocardium). There are four types of angina:  Stable angina. This is triggered by vigorous activity or exercise. It goes away when you rest or take angina medicine.  Unstable angina. This is a warning sign and can lead to a heart attack (acute coronary syndrome). This is a medical emergency. Symptoms come at rest and last a long time.  Microvascular angina. This affects the small coronary arteries. Symptoms include feeling tired and being short of breath.  Prinzmetal or variant angina. This is caused by a tightening (spasm) of the arteries that go to your heart. What are the causes? This condition is caused by atherosclerosis. This is the buildup of fat and cholesterol (plaque) in your arteries. The plaque may narrow or block the artery. Other causes include:  Sudden tightening of the muscles of the arteries in the heart (coronary spasm).  Small artery disease (microvascular dysfunction).  Problems with any of your heart valves (heart valve disease).  A tear in an artery in your heart (coronary artery dissection).  Cardiomyopathy,  or other heart disease. What increases the risk? You are more likely to develop this condition if you have:  High cholesterol.  High blood pressure.  Diabetes.  Family history of heart disease.  Inactive (sedentary) lifestyle, or you do not exercise enough.  Depression.  Had radiation to the left side of your chest. Other risk factors include:  Using tobacco.  Being obese.  Eating a diet high in saturated fats.  Being exposed to high stress or triggers of stress.  Using drugs, such as cocaine. Women have a greater risk for angina if:  They are older than 44.  They have gone through menopause (postmenopausal). What are the signs or symptoms? Common symptoms in both men and women may include:  Chest pain, which may: ? Feel like a crushing or squeezing in the chest, or a tightness, pressure, fullness, or heaviness in the chest. ? Last for more than a few minutes at a time, or it may stop and come back (recur) over the course of a few minutes.  Pain in the neck, arm, jaw, or back.  Unexplained heartburn or indigestion.  Shortness of breath.  Nausea.  Sudden cold sweats. Women and people with diabetes may have unusual (atypical) symptoms, such as:  Fatigue.  Unexplained feelings of nervousness or anxiety.  Unexplained weakness.  Dizziness or fainting. How is this diagnosed? This condition may be diagnosed based on:  Your symptoms and medical history.  Electrocardiogram (ECG) to measure the electrical activity in your heart.  Blood tests.  Stress test to look for signs of blockage when your heart is stressed.  CT angiogram to examine your heart and the blood flow to it.  Coronary angiogram to check your coronary arteries for blockage. How is this treated? Angina may be treated with:  Medicines to: ? Prevent blood clots and heart attack. ? Relax blood vessels and improve blood flow to the heart (nitrates). ? Reduce blood pressure, improve the  pumping action of the heart, and relax blood vessels that are spasming. ? Reduce cholesterol and help treat atherosclerosis.  A procedure to widen a narrowed or blocked coronary artery (angioplasty). A mesh tube may be placed in a coronary artery to keep it open (coronary stenting).  Surgery to allow blood to go around a blocked artery (coronary artery  bypass surgery). Follow these instructions at home: Medicines  Take over-the-counter and prescription medicines only as told by your health care provider.  Do not take the following medicines unless your health care provider approves: ? NSAIDs, such as ibuprofen, naproxen, or celecoxib. ? Vitamin supplements that contain vitamin A, vitamin E, or both. ? Hormone replacement therapy that contains estrogen with or without progestin. Eating and drinking   Eat a heart-healthy diet. This includes plenty of fresh fruits and vegetables, whole grains, low-fat (lean) protein, and low-fat dairy products.  Follow instructions from your health care provider about eating or drinking restrictions. Activity  Follow an exercise program approved by your health care provider. Join a cardiac rehabilitation program.  Take a break when you feel fatigued. Plan rest periods in your daily activities. Lifestyle   Do not use any products that contain nicotine or tobacco, such as cigarettes and e-cigarettes. If you need help quitting, ask your health care provider.  If your health care provider approves, limit alcohol intake to no more than 1 drink a day for women and 2 drinks a day for men. One drink equals 12 oz of beer, 5 oz of wine, or 1 oz of hard liquor. General instructions  Maintain a healthy weight.  Learn to manage stress.  Keep your vaccinations up to date. Get the flu (influenza) vaccine every year.  Talk to your health care provider if you feel depressed. Take a depression screening test to see if you are at risk for depression.  Work with  your health care provider to manage other health conditions, such as hypertension or diabetes.  Keep all follow-up visits as told by your health care provider. This is important. Get help right away if:  You have pain in your chest, neck, arm, jaw, or back, and the pain: ? Lasts more than a few minutes. ? Is recurring. ? Is not relieved by taking medicines under the tongue (sublingual nitroglycerin). ? Increases in intensity or frequency.  You have a lot of sweating without cause.  You have unexplained: ? Heartburn or indigestion. ? Shortness of breath or difficulty breathing. ? Nausea or vomiting. ? Fatigue. ? Feelings of nervousness or anxiety. ? Weakness.  You have sudden light-headedness or dizziness.  You faint. These symptoms may represent a serious problem that is an emergency. Do not wait to see if the symptoms will go away. Get medical help right away. Call your local emergency services (911 in the [place] Do not drive yourself to the hospital. Summary  Angina is extreme discomfort in the chest, neck, or arm that is caused by a lack of blood in the heart wall.  There are many symptoms of angina. They include chest pain or pain in the arms, neck, jaw, or back.  Angina may be treated with behavioral changes, medicine, or surgery.  Symptoms of angina may represent an emergency. Get medical help right away. Call your local emergency services (911 in the [place] Do not drive yourself to the hospital. This information is not intended to replace advice given to you by your health care provider. Make sure you discuss any questions you have with your health care provider. Document Released: 07/04/2005 Document Revised: 08/18/2017 Document Reviewed: 08/18/2017 Elsevier Interactive Patient Education  2019 [place] K. Johari Pinney M.D.

## 2018-11-23 ENCOUNTER — Other Ambulatory Visit: Payer: Self-pay

## 2018-11-23 ENCOUNTER — Ambulatory Visit (INDEPENDENT_AMBULATORY_CARE_PROVIDER_SITE_OTHER): Payer: Medicare Other | Admitting: Internal Medicine

## 2018-11-23 ENCOUNTER — Encounter: Payer: Self-pay | Admitting: Internal Medicine

## 2018-11-23 VITALS — BP 130/78 | HR 80 | Temp 98.2°F | Wt 183.0 lb

## 2018-11-23 DIAGNOSIS — R079 Chest pain, unspecified: Secondary | ICD-10-CM

## 2018-11-23 DIAGNOSIS — M79622 Pain in left upper arm: Secondary | ICD-10-CM

## 2018-11-23 DIAGNOSIS — R0789 Other chest pain: Secondary | ICD-10-CM | POA: Diagnosis not present

## 2018-11-23 DIAGNOSIS — C50411 Malignant neoplasm of upper-outer quadrant of right female breast: Secondary | ICD-10-CM

## 2018-11-23 NOTE — Patient Instructions (Addendum)
Your exam and ekg is reassuring .  Context  Doesn't fit  Heart pain but location is more typical.   Let get chest x ray and left  Shoulder arm x ray  For no surproises and then  Get opinon from the  Cardiology team  Poss a nother stress test  Stay on  Reflux med and if  You get   Shortness of breath inability   Serious change in exrecise ability seek  emregent care .   Come for x ray next week chest and shoulder arm   Nonspecific Chest Pain, Adult Chest pain can be caused by many different conditions. It can be caused by a condition that is life-threatening and requires treatment right away. It can also be caused by something that is not life-threatening. If you have chest pain, it can be hard to know the difference, so it is important to get help right away to make sure that you do not have a serious condition. Some life-threatening causes of chest pain include:  Heart attack.  A tear in the body's main blood vessel (aortic dissection).  Inflammation around your heart (pericarditis).  A problem in the lungs, such as a blood clot (pulmonary embolism) or a collapsed lung (pneumothorax). Some non life-threatening causes of chest pain include:  Heartburn.  Anxiety or stress.  Damage to the bones, muscles, and cartilage that make up your chest wall.  Pneumonia or bronchitis.  Shingles infection (varicella-zoster virus). Chest pain can feel like:  Pain or discomfort on the surface of your chest or deep in your chest.  Crushing, pressure, aching, or squeezing pain.  Burning or tingling.  Dull or sharp pain that is worse when you move, cough, or take a deep breath.  Pain or discomfort that is also felt in your back, neck, jaw, shoulder, or arm, or pain that spreads to any of these areas. Your chest pain may come and go. It may also be constant. Your health care provider will do lab tests and other studies to find the cause of your pain. Treatment will depend on the cause of your  chest pain. Follow these instructions at home: Medicines  Take over-the-counter and prescription medicines only as told by your health care provider.  If you were prescribed an antibiotic, take it as told by your health care provider. Do not stop taking the antibiotic even if you start to feel better. Lifestyle   Rest as directed by your health care provider.  Do not use any products that contain nicotine or tobacco, such as cigarettes and e-cigarettes. If you need help quitting, ask your health care provider.  Do not drink alcohol.  Make healthy lifestyle choices as recommended. These may include: ? Getting regular exercise. Ask your health care provider to suggest some activities that are safe for you. ? Eating a heart-healthy diet. This includes plenty of fresh fruits and vegetables, whole grains, low-fat (lean) protein, and low-fat dairy products. A dietitian can help you find healthy eating options. ? Maintaining a healthy weight. ? Managing any other health conditions you have, such as high blood pressure (hypertension) or diabetes. ? Reducing stress, such as with yoga or relaxation techniques. General instructions  Pay attention to any changes in your symptoms. Tell your health care provider about them or any new symptoms.  Avoid any activities that cause chest pain.  Keep all follow-up visits as told by your health care provider. This is important. This includes visits for any further testing if your  chest pain does not go away. Contact a health care provider if:  Your chest pain does not go away.  You feel depressed.  You have a fever. Get help right away if:  Your chest pain gets worse.  You have a cough that gets worse, or you cough up blood.  You have severe pain in your abdomen.  You faint.  You have sudden, unexplained chest discomfort.  You have sudden, unexplained discomfort in your arms, back, neck, or jaw.  You have shortness of breath at any  time.  You suddenly start to sweat, or your skin gets clammy.  You feel nausea or you vomit.  You suddenly feel lightheaded or dizzy.  You have severe weakness, or unexplained weakness or fatigue.  Your heart begins to beat quickly, or it feels like it is skipping beats. These symptoms may represent a serious problem that is an emergency. Do not wait to see if the symptoms will go away. Get medical help right away. Call your local emergency services (911 in the [place] Do not drive yourself to the hospital. Summary  Chest pain can be caused by a condition that is serious and requires urgent treatment. It may also be caused by something that is not life-threatening.  If you have chest pain, it is very important to see your health care provider. Your health care provider may do lab tests and other studies to find the cause of your pain.  Follow your health care provider's instructions on taking medicines, making lifestyle changes, and getting emergency treatment if symptoms become worse.  Keep all follow-up visits as told by your health care provider. This includes visits for any further testing if your chest pain does not go away. This information is not intended to replace advice given to you by your health care provider. Make sure you discuss any questions you have with your health care provider. Document Released: 04/13/2005 Document Revised: 01/04/2018 Document Reviewed: 01/04/2018 Elsevier Interactive Patient Education  2019 [employer].   Angina  Angina is extreme discomfort in the chest, neck, arm, jaw or back. The discomfort is caused by a lack of blood in the middle layer of the heart wall (myocardium). There are four types of angina:  Stable angina. This is triggered by vigorous activity or exercise. It goes away when you rest or take angina medicine.  Unstable angina. This is a warning sign and can lead to a heart attack (acute coronary syndrome). This is a medical  emergency. Symptoms come at rest and last a long time.  Microvascular angina. This affects the small coronary arteries. Symptoms include feeling tired and being short of breath.  Prinzmetal or variant angina. This is caused by a tightening (spasm) of the arteries that go to your heart. What are the causes? This condition is caused by atherosclerosis. This is the buildup of fat and cholesterol (plaque) in your arteries. The plaque may narrow or block the artery. Other causes include:  Sudden tightening of the muscles of the arteries in the heart (coronary spasm).  Small artery disease (microvascular dysfunction).  Problems with any of your heart valves (heart valve disease).  A tear in an artery in your heart (coronary artery dissection).  Cardiomyopathy, or other heart disease. What increases the risk? You are more likely to develop this condition if you have:  High cholesterol.  High blood pressure.  Diabetes.  Family history of heart disease.  Inactive (sedentary) lifestyle, or you do not exercise enough.  Depression.  Had radiation to the left side of your chest. Other risk factors include:  Using tobacco.  Being obese.  Eating a diet high in saturated fats.  Being exposed to high stress or triggers of stress.  Using drugs, such as cocaine. Women have a greater risk for angina if:  They are older than 82.  They have gone through menopause (postmenopausal). What are the signs or symptoms? Common symptoms in both men and women may include:  Chest pain, which may: ? Feel like a crushing or squeezing in the chest, or a tightness, pressure, fullness, or heaviness in the chest. ? Last for more than a few minutes at a time, or it may stop and come back (recur) over the course of a few minutes.  Pain in the neck, arm, jaw, or back.  Unexplained heartburn or indigestion.  Shortness of breath.  Nausea.  Sudden cold sweats. Women and people with diabetes may  have unusual (atypical) symptoms, such as:  Fatigue.  Unexplained feelings of nervousness or anxiety.  Unexplained weakness.  Dizziness or fainting. How is this diagnosed? This condition may be diagnosed based on:  Your symptoms and medical history.  Electrocardiogram (ECG) to measure the electrical activity in your heart.  Blood tests.  Stress test to look for signs of blockage when your heart is stressed.  CT angiogram to examine your heart and the blood flow to it.  Coronary angiogram to check your coronary arteries for blockage. How is this treated? Angina may be treated with:  Medicines to: ? Prevent blood clots and heart attack. ? Relax blood vessels and improve blood flow to the heart (nitrates). ? Reduce blood pressure, improve the pumping action of the heart, and relax blood vessels that are spasming. ? Reduce cholesterol and help treat atherosclerosis.  A procedure to widen a narrowed or blocked coronary artery (angioplasty). A mesh tube may be placed in a coronary artery to keep it open (coronary stenting).  Surgery to allow blood to go around a blocked artery (coronary artery bypass surgery). Follow these instructions at home: Medicines  Take over-the-counter and prescription medicines only as told by your health care provider.  Do not take the following medicines unless your health care provider approves: ? NSAIDs, such as ibuprofen, naproxen, or celecoxib. ? Vitamin supplements that contain vitamin A, vitamin E, or both. ? Hormone replacement therapy that contains estrogen with or without progestin. Eating and drinking   Eat a heart-healthy diet. This includes plenty of fresh fruits and vegetables, whole grains, low-fat (lean) protein, and low-fat dairy products.  Follow instructions from your health care provider about eating or drinking restrictions. Activity  Follow an exercise program approved by your health care provider. Join a cardiac  rehabilitation program.  Take a break when you feel fatigued. Plan rest periods in your daily activities. Lifestyle   Do not use any products that contain nicotine or tobacco, such as cigarettes and e-cigarettes. If you need help quitting, ask your health care provider.  If your health care provider approves, limit alcohol intake to no more than 1 drink a day for women and 2 drinks a day for men. One drink equals 12 oz of beer, 5 oz of wine, or 1 oz of hard liquor. General instructions  Maintain a healthy weight.  Learn to manage stress.  Keep your vaccinations up to date. Get the flu (influenza) vaccine every year.  Talk to your health care provider if you feel depressed. Take a depression screening test to  see if you are at risk for depression.  Work with your health care provider to manage other health conditions, such as hypertension or diabetes.  Keep all follow-up visits as told by your health care provider. This is important. Get help right away if:  You have pain in your chest, neck, arm, jaw, or back, and the pain: ? Lasts more than a few minutes. ? Is recurring. ? Is not relieved by taking medicines under the tongue (sublingual nitroglycerin). ? Increases in intensity or frequency.  You have a lot of sweating without cause.  You have unexplained: ? Heartburn or indigestion. ? Shortness of breath or difficulty breathing. ? Nausea or vomiting. ? Fatigue. ? Feelings of nervousness or anxiety. ? Weakness.  You have sudden light-headedness or dizziness.  You faint. These symptoms may represent a serious problem that is an emergency. Do not wait to see if the symptoms will go away. Get medical help right away. Call your local emergency services (911 in the [place] Do not drive yourself to the hospital. Summary  Angina is extreme discomfort in the chest, neck, or arm that is caused by a lack of blood in the heart wall.  There are many symptoms of angina. They  include chest pain or pain in the arms, neck, jaw, or back.  Angina may be treated with behavioral changes, medicine, or surgery.  Symptoms of angina may represent an emergency. Get medical help right away. Call your local emergency services (911 in the [place] Do not drive yourself to the hospital. This information is not intended to replace advice given to you by your health care provider. Make sure you discuss any questions you have with your health care provider. Document Released: 07/04/2005 Document Revised: 08/18/2017 Document Reviewed: 08/18/2017 Elsevier Interactive Patient Education  2019 [employer].

## 2018-11-26 ENCOUNTER — Ambulatory Visit (INDEPENDENT_AMBULATORY_CARE_PROVIDER_SITE_OTHER): Payer: Medicare Other

## 2018-11-26 DIAGNOSIS — M79622 Pain in left upper arm: Secondary | ICD-10-CM | POA: Diagnosis not present

## 2018-11-26 DIAGNOSIS — R079 Chest pain, unspecified: Secondary | ICD-10-CM | POA: Diagnosis not present

## 2018-11-26 DIAGNOSIS — R0789 Other chest pain: Secondary | ICD-10-CM

## 2018-11-29 ENCOUNTER — Telehealth: Payer: Self-pay | Admitting: *Deleted

## 2018-11-29 NOTE — Telephone Encounter (Signed)
Pt calling for xray results states that she only has one current phone number that is working that number is [phone]. Pt says to please call the [phone] with results

## 2018-11-29 NOTE — Telephone Encounter (Signed)
Patient has been given results. Referral was placed per Dr. Regis Bill. See result note for further documentation.

## 2019-01-14 ENCOUNTER — Telehealth: Payer: Self-pay | Admitting: Hematology and Oncology

## 2019-01-14 NOTE — Assessment & Plan Note (Signed)
Right breast invasive ductal carcinoma T2, N0, M0 stage IIA 0.9 cm and a separate nodule 0.25 cm ER/PR positive HER-2 negative, Oncotype DX recurrence score is 14 (9% risk of distant recurrence over 10 years with tamoxifen alone) status post radiation therapy, started antiestrogen therapy with anastrozole in January 2016 years switched to tamoxifen 09/23/2014 (due to osteoporosis)  Tamoxifen toxicities: Denies any hot flashes or myalgias Endometrial thickening:She no longer has a white discharge.  Osteoporosis: DEXA scan November 2015 showed a T score of -2.6 (because of which we switched treatment to tamoxifen): Prolia was offered but patient refused. 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 Breast exam 10/02/2017: Normal 2. Mammogram9/27/2019 No abnormalities. Postsurgical changes. Breast Density Category B.  Return to clinic in 1 year for follow-up

## 2019-01-14 NOTE — Telephone Encounter (Signed)
I LEFT  A message regarding visit and how to become active with my chart

## 2019-01-17 ENCOUNTER — Telehealth: Payer: Self-pay | Admitting: Hematology and Oncology

## 2019-01-17 NOTE — Telephone Encounter (Signed)
Left voicemail to confirm appt and verify info. °

## 2019-01-18 NOTE — Progress Notes (Signed)
HEMATOLOGY-ONCOLOGY TELEPHONE VISIT PROGRESS NOTE  I connected with Donna Manning on 01/21/2019 at  9:15 AM EDT by telephone and verified that I am speaking with the correct person using two identifiers.  I discussed the limitations, risks, security and privacy concerns of performing an evaluation and management service by telephone and the availability of in person appointments.  I also discussed with the patient that there may be a patient responsible charge related to this service. The patient expressed understanding and agreed to proceed.   History of Present Illness: Donna Manning is a 68 y.o. female with above-mentioned history of right breast cancer treated with lumpectomy, radiation, and who is currently on anti-estrogen therapy with tamoxifen. I last saw her over a year ago. Mammogram on 03/27/18 showed a distortion in the left breast. Biopsy on 03/29/19 showed atypical lobular hyperplasia. She underwent a lumpectomy for removal on 04/24/18 with Dr. Brantley Stage. She presents over the phone today for annual follow-up.   Oncology History  Breast cancer of upper-outer quadrant of right female breast ([place])  12/24/2013 Initial Diagnosis   Breast cancer of upper-outer quadrant of right female breast: Initial biopsy showed DCIS with calcifications and atypical ductal hyperplasia with suspicion of stromal invasion ER 100% PR 100%   01/20/2014 Surgery   Right breast lumpectomy, IDC grade 1; 3.9 cm and 0.25 cm with DCIS int grade with ALH 3 SLN negative superior margin positive, ER 100% PR 100% HER-2 negative ratio 1.1 Ki-67 3% Oncotype 14 low risk    02/05/2014 Surgery   Reexcision of the superior margin no residual cancer   04/17/2014 - 05/16/2014 Radiation Therapy   Adjuvant radiation therapy   07/25/2014 -  Anti-estrogen oral therapy   Anastrozole 1 mg daily switched to tamoxifen 20 mg daily from 09/23/2014 due to osteoporosis   04/24/2018 Surgery   Mammogram on 03/27/18 showed a distortion in  the left breast. Biopsy on 03/29/19 showed atypical lobular hyperplasia. Lumpectomy on 04/24/18 with Dr. Brantley Stage.     Observations/Objective:  No clinical evidence of disease recurrence   Assessment Plan:  Breast cancer of upper-outer quadrant of right female breast Right breast invasive ductal carcinoma T2, N0, M0 stage IIA 0.9 cm and a separate nodule 0.25 cm ER/PR positive HER-2 negative, Oncotype DX recurrence score is 14 (9% risk of distant recurrence over 10 years with tamoxifen alone) status post radiation therapy, started antiestrogen therapy with anastrozole in January 2016 years switched to tamoxifen 09/23/2014 (due to osteoporosis), plan to treat for 10 years 03/28/2018: Atypical lobular hyperplasia left breast biopsy, left lumpectomy: Fibrocystic change  Tamoxifen toxicities: Denies any hot flashes or myalgias Endometrial thickening:She no longer has a white discharge.  Osteoporosis: DEXA scan November 2015 showed a T score of -2.6 (because of which we switched treatment to tamoxifen): Prolia was offered but patient refused. 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  I felt that she was low risk for COVID. Breast Cancer Surveillance: 1. Breast exam 10/02/2017: Normal 2. Mammogram9/27/2019 No abnormalities. Postsurgical changes. Breast Density Category B.  I sent a prescription for 1 year refills of tamoxifen Return to clinic in 1 year for follow-up    I discussed the assessment and treatment plan with the patient. The patient was provided an opportunity to ask questions and all were answered. The patient agreed with the plan and demonstrated an understanding of the instructions. The patient was advised to  call back or seek an in-person evaluation if the symptoms worsen or if the condition fails to improve as anticipated.   I provided 15 minutes of non-face-to-face  time during this encounter.   Rulon Eisenmenger, MD 01/21/2019    I, Molly Dorshimer, am acting as scribe for Nicholas Lose, MD.  I have reviewed the above documentation for accuracy and completeness, and I agree with the above.

## 2019-01-21 ENCOUNTER — Inpatient Hospital Stay: Payer: Medicare Other | Attending: Hematology and Oncology | Admitting: Hematology and Oncology

## 2019-01-21 DIAGNOSIS — Z79811 Long term (current) use of aromatase inhibitors: Secondary | ICD-10-CM | POA: Diagnosis not present

## 2019-01-21 DIAGNOSIS — C50411 Malignant neoplasm of upper-outer quadrant of right female breast: Secondary | ICD-10-CM | POA: Diagnosis not present

## 2019-01-21 DIAGNOSIS — Z170 Estrogen receptor positive status [ER+]: Secondary | ICD-10-CM | POA: Diagnosis not present

## 2019-01-21 DIAGNOSIS — Z923 Personal history of irradiation: Secondary | ICD-10-CM

## 2019-01-21 MED ORDER — TAMOXIFEN CITRATE 20 MG PO TABS: 20.0000 mg | ORAL_TABLET | Freq: Every day | ORAL | 3 refills | Status: DC

## 2019-02-09 ENCOUNTER — Other Ambulatory Visit: Payer: Self-pay | Admitting: Gastroenterology

## 2019-02-11 NOTE — Telephone Encounter (Signed)
Left message for patient to call me back to confirm dosage. At last visit says one daily at bedtime as needed.

## 2019-02-11 NOTE — Telephone Encounter (Signed)
Donna Manning called back and said the omeprazole is working and she is not having to use the generic pepcid now. She said she didn't call for this refill. I will deny it.

## 2019-04-11 ENCOUNTER — Other Ambulatory Visit: Payer: Self-pay | Admitting: Hematology and Oncology

## 2019-04-11 DIAGNOSIS — Z853 Personal history of malignant neoplasm of breast: Secondary | ICD-10-CM

## 2019-04-24 ENCOUNTER — Ambulatory Visit
Admission: RE | Admit: 2019-04-24 | Discharge: 2019-04-24 | Disposition: A | Discharge status: Home / Self Care | Payer: Medicare Other | Source: Ambulatory Visit | Attending: Hematology and Oncology | Admitting: Hematology and Oncology

## 2019-04-24 ENCOUNTER — Other Ambulatory Visit: Payer: Self-pay

## 2019-04-24 DIAGNOSIS — R928 Other abnormal and inconclusive findings on diagnostic imaging of breast: Secondary | ICD-10-CM | POA: Diagnosis not present

## 2019-04-24 DIAGNOSIS — Z853 Personal history of malignant neoplasm of breast: Secondary | ICD-10-CM | POA: Diagnosis not present

## 2019-05-01 DIAGNOSIS — Z23 Encounter for immunization: Secondary | ICD-10-CM | POA: Diagnosis not present

## 2019-06-06 ENCOUNTER — Telehealth: Payer: Self-pay

## 2019-06-06 DIAGNOSIS — R739 Hyperglycemia, unspecified: Secondary | ICD-10-CM

## 2019-06-06 DIAGNOSIS — E781 Pure hyperglyceridemia: Secondary | ICD-10-CM

## 2019-06-06 DIAGNOSIS — E039 Hypothyroidism, unspecified: Secondary | ICD-10-CM

## 2019-06-06 DIAGNOSIS — Z79899 Other long term (current) drug therapy: Secondary | ICD-10-CM

## 2019-06-06 DIAGNOSIS — C50411 Malignant neoplasm of upper-outer quadrant of right female breast: Secondary | ICD-10-CM

## 2019-06-06 NOTE — Telephone Encounter (Signed)
Copied from [place] [id number]. Topic: General - Other >> Jun 06, 2019  9:27 AM Alanda Slim E wrote: Reason for CRM: Pt would like to have blood work done soon to check her blood sugars and would like orders put in asap/ please advise

## 2019-06-06 NOTE — Addendum Note (Signed)
Addended byShanon Ace K on: 06/06/2019 11:27 AM   Modules accepted: Orders

## 2019-06-06 NOTE — Telephone Encounter (Signed)
I have placed future order for fasting blood work  She should get appt for this and then a fu appt in person preferred  But could do virtual if needed .    To review and go from there  Her last visit w me was ? A year ago?

## 2019-06-06 NOTE — Telephone Encounter (Signed)
Pt has been scheduled lab appt  

## 2019-06-17 ENCOUNTER — Other Ambulatory Visit (INDEPENDENT_AMBULATORY_CARE_PROVIDER_SITE_OTHER): Payer: Medicare Other

## 2019-06-17 ENCOUNTER — Other Ambulatory Visit: Payer: Self-pay

## 2019-06-17 DIAGNOSIS — C50411 Malignant neoplasm of upper-outer quadrant of right female breast: Secondary | ICD-10-CM

## 2019-06-17 DIAGNOSIS — Z79899 Other long term (current) drug therapy: Secondary | ICD-10-CM | POA: Diagnosis not present

## 2019-06-17 DIAGNOSIS — E039 Hypothyroidism, unspecified: Secondary | ICD-10-CM

## 2019-06-17 DIAGNOSIS — R739 Hyperglycemia, unspecified: Secondary | ICD-10-CM | POA: Diagnosis not present

## 2019-06-17 DIAGNOSIS — E781 Pure hyperglyceridemia: Secondary | ICD-10-CM | POA: Diagnosis not present

## 2019-06-17 LAB — CBC WITH DIFFERENTIAL/PLATELET
Basophils Absolute: 0.1 10*3/uL (ref 0.0–0.1)
Basophils Relative: 1.1 % (ref 0.0–3.0)
Eosinophils Absolute: 0.2 10*3/uL (ref 0.0–0.7)
Eosinophils Relative: 5.5 % — ABNORMAL HIGH (ref 0.0–5.0)
HCT: 35.7 % — ABNORMAL LOW (ref 36.0–46.0)
Hemoglobin: 11.9 g/dL — ABNORMAL LOW (ref 12.0–15.0)
Lymphocytes Relative: 30.5 % (ref 12.0–46.0)
Lymphs Abs: 1.4 10*3/uL (ref 0.7–4.0)
MCHC: 33.3 g/dL (ref 30.0–36.0)
MCV: 92.6 fl (ref 78.0–100.0)
Monocytes Absolute: 0.3 10*3/uL (ref 0.1–1.0)
Monocytes Relative: 7.6 % (ref 3.0–12.0)
Neutro Abs: 2.5 10*3/uL (ref 1.4–7.7)
Neutrophils Relative %: 55.3 % (ref 43.0–77.0)
Platelets: 233 10*3/uL (ref 150.0–400.0)
RBC: 3.85 Mil/uL — ABNORMAL LOW (ref 3.87–5.11)
RDW: 12.7 % (ref 11.5–15.5)
WBC: 4.5 10*3/uL (ref 4.0–10.5)

## 2019-06-17 LAB — LIPID PANEL
Cholesterol: 185 mg/dL (ref 0–200)
HDL: 49.3 mg/dL (ref >39.00)
LDL Cholesterol: 115 mg/dL — ABNORMAL HIGH (ref 0–99)
NonHDL: 135.94
Total CHOL/HDL Ratio: 4
Triglycerides: 105 mg/dL (ref 0.0–149.0)
VLDL: 21 mg/dL (ref 0.0–40.0)

## 2019-06-17 LAB — BASIC METABOLIC PANEL
BUN: 11 mg/dL (ref 6–23)
CO2: 27 mEq/L (ref 19–32)
Calcium: 8.9 mg/dL (ref 8.4–10.5)
Chloride: 105 mEq/L (ref 96–112)
Creatinine, Ser: 0.8 mg/dL (ref 0.40–1.20)
GFR: 70.97 mL/min (ref >60.00)
Glucose, Bld: 120 mg/dL — ABNORMAL HIGH (ref 70–99)
Potassium: 3.9 mEq/L (ref 3.5–5.1)
Sodium: 139 mEq/L (ref 135–145)

## 2019-06-17 LAB — HEPATIC FUNCTION PANEL
ALT: 19 U/L (ref 0–35)
AST: 23 U/L (ref 0–37)
Albumin: 4 g/dL (ref 3.5–5.2)
Alkaline Phosphatase: 31 U/L — ABNORMAL LOW (ref 39–117)
Bilirubin, Direct: 0.1 mg/dL (ref 0.0–0.3)
Total Bilirubin: 0.6 mg/dL (ref 0.2–1.2)
Total Protein: 6.6 g/dL (ref 6.0–8.3)

## 2019-06-17 LAB — HEMOGLOBIN A1C: Hgb A1c MFr Bld: 6.1 % (ref 4.6–6.5)

## 2019-06-17 LAB — TSH: TSH: 1.13 u[IU]/mL (ref 0.35–4.50)

## 2019-06-17 LAB — T4, FREE: Free T4: 0.9 ng/dL (ref 0.60–1.60)

## 2019-06-25 ENCOUNTER — Other Ambulatory Visit: Payer: Self-pay | Admitting: Internal Medicine

## 2019-06-27 ENCOUNTER — Other Ambulatory Visit: Payer: Self-pay | Admitting: Gastroenterology

## 2019-08-02 ENCOUNTER — Other Ambulatory Visit: Payer: Self-pay

## 2019-08-05 ENCOUNTER — Ambulatory Visit (INDEPENDENT_AMBULATORY_CARE_PROVIDER_SITE_OTHER): Payer: Medicare Other | Admitting: Internal Medicine

## 2019-08-05 ENCOUNTER — Encounter: Payer: Self-pay | Admitting: Internal Medicine

## 2019-08-05 ENCOUNTER — Ambulatory Visit (HOSPITAL_COMMUNITY): Payer: Medicare Other | Attending: Internal Medicine

## 2019-08-05 ENCOUNTER — Other Ambulatory Visit: Payer: Self-pay

## 2019-08-05 VITALS — BP 124/68 | HR 85 | Ht 67.0 in | Wt 182.8 lb

## 2019-08-05 VITALS — BP 126/68 | HR 94 | Temp 97.6°F | Ht 67.0 in | Wt 182.8 lb

## 2019-08-05 DIAGNOSIS — M79674 Pain in right toe(s): Secondary | ICD-10-CM

## 2019-08-05 DIAGNOSIS — E781 Pure hyperglyceridemia: Secondary | ICD-10-CM | POA: Diagnosis not present

## 2019-08-05 DIAGNOSIS — D649 Anemia, unspecified: Secondary | ICD-10-CM | POA: Diagnosis not present

## 2019-08-05 DIAGNOSIS — C50411 Malignant neoplasm of upper-outer quadrant of right female breast: Secondary | ICD-10-CM | POA: Diagnosis not present

## 2019-08-05 DIAGNOSIS — E039 Hypothyroidism, unspecified: Secondary | ICD-10-CM

## 2019-08-05 DIAGNOSIS — M79605 Pain in left leg: Secondary | ICD-10-CM | POA: Diagnosis not present

## 2019-08-05 DIAGNOSIS — R7301 Impaired fasting glucose: Secondary | ICD-10-CM

## 2019-08-05 DIAGNOSIS — Z79899 Other long term (current) drug therapy: Secondary | ICD-10-CM

## 2019-08-05 DIAGNOSIS — R0602 Shortness of breath: Secondary | ICD-10-CM | POA: Diagnosis not present

## 2019-08-05 LAB — ECHOCARDIOGRAM COMPLETE
Height: 67 in
Weight: 2924.8 oz

## 2019-08-05 NOTE — Progress Notes (Signed)
Cardiology Office Note   Date:  08/05/2019   ID:  Donna Manning, Donna Manning January 11, 1951, MRN [id number]  PCP:  Burnis Medin, MD  Cardiologist:   Dorris Carnes, MD   Patient referred by [name] for chest pain   History of Present Illness: Donna Manning is a 69 y.o. female with a history of chest pain.  She is followed by [name].  She was seen in clinic back in May 2020.  Per that clinic note, chest pain lasted minutes at night about 3 times per week.  Would awaken her.  On and off.  Was left-sided with occasional jaw discomfort.  Patient at the time thought the jaw discomfort was related to teeth clenching also had some left upper arm pain.  With activity the patient did not have spells.  No change in ability to do things  The patient had improvement symptoms  Then one night in November she woke up felt like an elephant was pressing on her chest.  This eased off.  She has not had any of that symptoms  since.  Note I saw the patient back in 2012   She had a stress echo in 2012 for possible arrhythmia.    Patient tries to walk 4000 steps per day.  Again she is not having associated chest discomfort.  She does note some wheezing.  Had PFTs in the past..  She denies reflux symptoms (burping, bitter taste)       Current Meds  Medication Sig  . famotidine (PEPCID) 20 MG tablet TAKE 1 TABLET BY MOUTH TWICE A DAY  . levothyroxine (SYNTHROID) 88 MCG tablet TAKE 1 TABLET BY MOUTH EVERY DAY  . tamoxifen (NOLVADEX) 20 MG tablet Take 1 tablet (20 mg total) by mouth daily.  . valACYclovir (VALTREX) 1000 MG tablet Take 2 tablets (2,000 mg total) by mouth 2 (two) times daily as needed.     Allergies:   Contrast media [iodinated diagnostic agents], Actonel [risedronate sodium], and Starch   Past Medical History:  Diagnosis Date  . 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 Barrett's esophagus 2009  . Benign head tremor   . Breast cancer, right breast ([place])  01/2014   "had 5 weeks radiation after lumpectomy" (04/24/2018)  . Dental bridge present    lower left  . Dental crowns present   . GERD (gastroesophageal reflux disease)   . History of DVT (deep vein thrombosis) ~ 1980   after a pregnancy  . History of radiation therapy 2015  . Hypothyroidism   . Irregular heart beat    "dx'd by 1 dr; not noted by the cardiologist" (04/24/2018)  . NSAID-induced gastric ulcer   . PONV (postoperative nausea and vomiting)     Past Surgical History:  Procedure Laterality Date  . BREAST BIOPSY Right 1991  . BREAST EXCISIONAL BIOPSY Left 2019  . BREAST LUMPECTOMY Right 1991  . BREAST LUMPECTOMY W/ NEEDLE LOCALIZATION Left 04/24/2018  . BREAST LUMPECTOMY WITH NEEDLE LOCALIZATION Left 04/24/2018   Procedure: LEFT BREAST LUMPECTOMY WITH NEEDLE LOCALIZATION;  Surgeon: [name], MD;  Location: [place] [facility];  Service: General;  Laterality: Left;  . COLONOSCOPY     "w/hemorrhoid banding"  . PARTIAL MASTECTOMY WITH NEEDLE LOCALIZATION AND AXILLARY SENTINEL LYMPH NODE BX Right 01/20/2014   Procedure: RIGHT PARTIAL MASTECTOMY WITH DOUBLE  NEEDLE LOCALIZATION (BRACKETED )AND AXILLARY SENTINEL LYMPH NODE BX;  Surgeon: [name], MD;  Location: [facility];  Service: General;  Laterality: Right;  And [place].  Marland Kitchen RE-EXCISION OF BREAST CANCER,SUPERIOR MARGINS Right 02/05/2014   Procedure: RIGHT LUMPECTOMY, RE-EXCISION OF BREAST CANCER,SUPERIOR MARGINS;  Surgeon: [name], MD;  Location: [facility];  Service: General;  Laterality: Right;  . UPPER GI ENDOSCOPY  12/25/2014   with Propofol     Social History:  The patient  reports that she has never smoked. She has never used smokeless tobacco. She reports previous alcohol use. She reports that she does not use drugs.   Family History:  The patient's family history includes Diabetes in her mother; Heart failure (age of onset: 37) in her mother; Parkinsonism (age of  onset: 50) in her father; Prostate cancer in her brother; Stomach cancer in her paternal grandfather; Stroke in her father; Thyroid disease in her sister.    ROS:  Please see the history of present illness. All other systems are reviewed and  Negative to the above problem except as noted.    PHYSICAL EXAM: VS:  BP 124/68   Pulse 85   Ht 5\' 7"  (1.702 m)   Wt 182 lb 12.8 oz (82.9 kg)   BMI 28.63 kg/m   GEN: Well nourished, well developed, in no acute distress  HEENT: normal  Neck: no JVD, carotid bruits, Cardiac: RRR; no murmurs, rubs, or gallops,no edema  Respiratory:  clear to auscultation bilaterally, normal work of breathing no wheezes. GI: soft, nontender, nondistended, + BS  No hepatomegaly  MS: no deformity Moving all extremities   Skin: warm and dry, no rash Neuro:  Strength and sensation are intact Psych: euthymic mood, full affect   EKG:  EKG is ordered today.  Sinus rhythm 85 bpm.   Lipid Panel    Component Value Date/Time   CHOL 185 06/17/2019 1007   TRIG 105.0 06/17/2019 1007   HDL 49.30 06/17/2019 1007   CHOLHDL 4 06/17/2019 1007   VLDL 21.0 06/17/2019 1007   LDLCALC 115 (H) 06/17/2019 1007   LDLDIRECT 147.2 11/23/2011 0912      Wt Readings from Last 3 Encounters:  08/05/19 182 lb 12.8 oz (82.9 kg)  11/23/18 183 lb (83 kg)  11/22/18 182 lb (82.6 kg)      ASSESSMENT AND PLAN:  1.  Chest discomfort  Atypical for angina.  More concerning is the wheezing that she is having.  On exam today her she is moving air okay.  Volume status looks good.  I would recommend for setting up for an echocardiogram to evaluate LV systolic and diastolic function.  Depending on the results further work-up with possible CT scan or other.  Take activities as tolerated.  2  Lipids.  Last LDL in November was 115 HDL 49.  Would follow for now.  Follow-up based on test results.   Current medicines are reviewed at length with the patient today.  The patient does not have  concerns regarding medicines.  Signed, Dorris Carnes, MD  08/05/2019 8:59 AM    [facility] [place], [place],   [address] Phone: [phone]; Fax: [phone]

## 2019-08-05 NOTE — Progress Notes (Signed)
This visit occurred during the SARS-CoV-2 public health emergency.  Safety protocols were in place, including screening questions prior to the visit, additional usage of staff PPE, and extensive cleaning of exam room while observing appropriate contact time as indicated for disinfecting solutions.    Chief Complaint  Patient presents with  . Annual Exam    pt has no concerns today   . Medication Management       . Hypothyroidism    HPI: Donna Manning 69 y.o. come in for yearly visit and to review labs.  Had laboratory studies done November 30.  Showing mild borderline anemia which she had in the remote past and blood sugar 120 and A1c of 6.1 since that time she is taken on some healthier habits and is trying to walk.  Improved since Christmas and walking more.  She does have some pain around the second and third toe on the right foot from the ball of her foot down perhaps nerve symptoms but no injury no stocking glove symptoms she also has left inner thigh pain that sometimes bothers her when she walks no swelling no specific injury has been there for a few months.  Denies any bleeding does take famotidine and/or Prilosec.  She had an endoscopy in the last year or so and was told that she does not really have Barrett's but not sure when her colonoscopy is due.   Right breast cancer  2019   hypothyroid  Last colon peters  Not cleaned   Vomited under anesthesia.   Had echocardiogram and evaluation Dr. Harrington Challenger today has not return so far at this time.  States that she can hear some wheezing when she sitting in a chair usually points to the upper airway.    Safety fall  No  HH of 2 plus grand dogs  Hours :  About 7.5  Improving since x mas   Walking more  ROS: See pertinent positives and negatives per HPI. Needs to see eye doc and  Hard to read since   Glasses.  Plans on eye exam.  Past Medical History:  Diagnosis Date  . Atypical lobular hyperplasia (ALH) of left breast  03/2018  . Barrett's esophagus 2009  . Benign head tremor   . Breast cancer, right breast ([place]) 01/2014   "had 5 weeks radiation after lumpectomy" (04/24/2018)  . Dental bridge present    lower left  . Dental crowns present   . GERD (gastroesophageal reflux disease)   . History of DVT (deep vein thrombosis) ~ 1980   after a pregnancy  . History of radiation therapy 2015  . Hypothyroidism   . Irregular heart beat    "dx'd by 1 dr; not noted by the cardiologist" (04/24/2018)  . NSAID-induced gastric ulcer   . PONV (postoperative nausea and vomiting)     Family History  Problem Relation Age of Onset  . Heart failure Mother 58  . Diabetes Mother   . Parkinsonism Father 35  . Stroke Father   . Thyroid disease Sister   . Prostate cancer Brother   . Stomach cancer Paternal Grandfather       Outpatient Medications Prior to Visit  Medication Sig Dispense Refill  . famotidine (PEPCID) 20 MG tablet TAKE 1 TABLET BY MOUTH TWICE A DAY 180 tablet 0  . levothyroxine (SYNTHROID) 88 MCG tablet TAKE 1 TABLET BY MOUTH EVERY DAY 90 tablet 1  . tamoxifen (NOLVADEX) 20 MG tablet Take 1 tablet (20 mg total) by mouth  daily. 90 tablet 3  . valACYclovir (VALTREX) 1000 MG tablet Take 2 tablets (2,000 mg total) by mouth 2 (two) times daily as needed. 30 tablet 2   No facility-administered medications prior to visit.     EXAM:  BP 126/68 (BP Location: Right Arm, Patient Position: Sitting, Cuff Size: Normal)   Pulse 94   Temp 97.6 F (36.4 C) (Temporal)   Ht 5\' 7"  (1.702 m)   Wt 182 lb 12.8 oz (82.9 kg)   SpO2 95%   BMI 28.63 kg/m   Body mass index is 28.63 kg/m. Wt Readings from Last 3 Encounters:  08/05/19 182 lb 12.8 oz (82.9 kg)  08/05/19 182 lb 12.8 oz (82.9 kg)  11/23/18 183 lb (83 kg)    GENERAL: vitals reviewed and listed above, alert, oriented, appears well hydrated and in no acute distress HEENT: atraumatic, conjunctiva  clear, no obvious abnormalities on inspection of  external nose and ears OP : masked  NECK: no obvious masses on inspection palpation  LUNGS: clear to auscultation bilaterally, no wheezes, rales or rhonchi, good air movement CV: HRRR, no clubbing cyanosis or  peripheral edema nl cap refill  Abdomen:  Sof,t normal bowel sounds without hepatosplenomegaly, no guarding rebound or masses no CVA tenderness MS: moves all extremities without noticeable focal  Abnormality right foot   Toes nl  No acute deformity   No sig edema  PSYCH: pleasant and cooperative, no obvious depression or anxiety Lab Results  Component Value Date   WBC 4.5 06/17/2019   HGB 11.9 (L) 06/17/2019   HCT 35.7 (L) 06/17/2019   PLT 233.0 06/17/2019   GLUCOSE 120 (H) 06/17/2019   CHOL 185 06/17/2019   TRIG 105.0 06/17/2019   HDL 49.30 06/17/2019   LDLDIRECT 147.2 11/23/2011   LDLCALC 115 (H) 06/17/2019   ALT 19 06/17/2019   AST 23 06/17/2019   NA 139 06/17/2019   K 3.9 06/17/2019   CL 105 06/17/2019   CREATININE 0.80 06/17/2019   BUN 11 06/17/2019   CO2 27 06/17/2019   TSH 1.13 06/17/2019   HGBA1C 6.1 06/17/2019   BP Readings from Last 3 Encounters:  08/05/19 126/68  08/05/19 124/68  11/23/18 130/78    ASSESSMENT AND PLAN:  Discussed the following assessment and plan:  Hypothyroidism, unspecified type  Medication management - Plan: Basic metabolic panel, CBC with Differential/Platelet, Hemoglobin A1c, IBC + Ferritin, Vitamin B12  Mild anemia - Plan: Basic metabolic panel, CBC with Differential/Platelet, Hemoglobin A1c, IBC + Ferritin, Vitamin B12  High triglycerides  Malignant neoplasm of upper-outer quadrant of right female breast, unspecified estrogen receptor status (HCC)  Fasting hyperglycemia - Plan: Basic metabolic panel, CBC with Differential/Platelet, Hemoglobin A1c, IBC + Ferritin, Vitamin B12  Pain in toe of right foot - consdier compresssive  sx   use  padding  and get SPorts med  evaluation - Plan: Ambulatory referral to Sports Medicine,  Basic metabolic panel, CBC with Differential/Platelet, Hemoglobin A1c, IBC + Ferritin, Vitamin B12  Pain in left leg - assume Ms cause  - Plan: Ambulatory referral to Sports Medicine, Basic metabolic panel, CBC with Differential/Platelet, Hemoglobin A1c, IBC + Ferritin, Vitamin B12 Labs done  11 30  utd  tsh in range  a1c 6.1  Lipids  Lab I May Anemia   A 1c   Etc fu  Borderline anemia   Hx of same uncertain if iron abs issue with ppi etc  Echo brief interpretation of  Good function and valve but would send /s  to dr Harrington Challenger.  Lung exam is normal today and sounds she disc seems more related to upper airway and if   persistent or progressive consider  pfts etc  But exam reassuring today and looks well  Plan inc iron rich diet and reassess at fu.  Husbands  Dementia and night wakening's have effected her fatigue level  -Patient advised to return or notify health care team  if  new concerns arise.  Patient Instructions  Plan labs  Fasting in  Early May   Continue lifestyle intervention healthy eating and exercise .  Checking blood sugars and  Anemia iron levels.  Your levels are in the pre diabetic range   Increase iron rich food sin diet.   Then go from there.   Will direct   ?s to dr Harrington Challenger  Will send infor also to dr Silverio Decamp .   Will do a referral to Sports medicine Drt smith et al  For the right toe and left leg  Sx  .  In interim get good shoe inserts for metatarsal padding     Iron-Rich Diet  Iron is a mineral that helps your body to produce hemoglobin. Hemoglobin is a protein in red blood cells that carries oxygen to your body's tissues. Eating too little iron may cause you to feel weak and tired, and it can increase your risk of infection. Iron is naturally found in many foods, and many foods have iron added to them (iron-fortified foods). You may need to follow an iron-rich diet if you do not have enough iron in your body due to certain medical conditions. The amount of iron that you  need each day depends on your age, your sex, and any medical conditions you have. Follow instructions from your health care provider or a diet and nutrition specialist (dietitian) about how much iron you should eat each day. What are tips for following this plan? Reading food labels  Check food labels to see how many milligrams (mg) of iron are in each serving. Cooking  Cook foods in pots and pans that are made from iron.  Take these steps to make it easier for your body to absorb iron from certain foods: ? Soak beans overnight before cooking. ? Soak whole grains overnight and drain them before using. ? Ferment flours before baking, such as by using yeast in bread dough. Meal planning  When you eat foods that contain iron, you should eat them with foods that are high in vitamin C. These include oranges, peppers, tomatoes, potatoes, and mango. Vitamin C helps your body to absorb iron. General information  Take iron supplements only as told by your health care provider. An overdose of iron can be life-threatening. If you were prescribed iron supplements, take them with orange juice or a vitamin C supplement.  When you eat iron-fortified foods or take an iron supplement, you should also eat foods that naturally contain iron, such as meat, poultry, and fish. Eating naturally iron-rich foods helps your body to absorb the iron that is added to other foods or contained in a supplement.  Certain foods and drinks prevent your body from absorbing iron properly. Avoid eating these foods in the same meal as iron-rich foods or with iron supplements. These foods include: ? Coffee, black tea, and red wine. ? Milk, dairy products, and foods that are high in calcium. ? Beans and soybeans. ? Whole grains. What foods should I eat? Fruits Prunes. Raisins. Eat fruits high in vitamin C, such  as oranges, grapefruits, and strawberries, alongside iron-rich foods. Vegetables Spinach (cooked). Green peas.  Broccoli. Fermented vegetables. Eat vegetables high in vitamin C, such as leafy greens, potatoes, bell peppers, and tomatoes, alongside iron-rich foods. Grains Iron-fortified breakfast cereal. Iron-fortified whole-wheat bread. Enriched rice. Sprouted grains. Meats and other proteins Beef liver. Oysters. Beef. Shrimp. [place]. Chicken. [place]. Sardines. Chickpeas. Nuts. Tofu. Pumpkin seeds. Beverages Tomato juice. Fresh orange juice. Prune juice. Hibiscus tea. Fortified instant breakfast shakes. Sweets and desserts Blackstrap molasses. Seasonings and condiments Tahini. Fermented soy sauce. Other foods Wheat germ. The items listed above may not be a complete list of recommended foods and beverages. Contact a dietitian for more information. What foods should I avoid? Grains Whole grains. Bran cereal. Bran flour. Oats. Meats and other proteins Soybeans. Products made from soy protein. Black beans. Lentils. Mung beans. Split peas. Dairy Milk. Cream. Cheese. Yogurt. Cottage cheese. Beverages Coffee. Black tea. Red wine. Sweets and desserts Cocoa. Chocolate. Ice cream. Other foods Basil. Oregano. Large amounts of parsley. The items listed above may not be a complete list of foods and beverages to avoid. Contact a dietitian for more information. Summary  Iron is a mineral that helps your body to produce hemoglobin. Hemoglobin is a protein in red blood cells that carries oxygen to your body's tissues.  Iron is naturally found in many foods, and many foods have iron added to them (iron-fortified foods).  When you eat foods that contain iron, you should eat them with foods that are high in vitamin C. Vitamin C helps your body to absorb iron.  Certain foods and drinks prevent your body from absorbing iron properly, such as whole grains and dairy products. You should avoid eating these foods in the same meal as iron-rich foods or with iron supplements. This information is not intended to  replace advice given to you by your health care provider. Make sure you discuss any questions you have with your health care provider. Document Revised: 06/16/2017 Document Reviewed: 05/30/2017 Elsevier Patient Education  2020 [employer] Nelida Mandarino M.D.

## 2019-08-05 NOTE — Patient Instructions (Addendum)
Plan labs  Fasting in  Early May   Continue lifestyle intervention healthy eating and exercise .  Checking blood sugars and  Anemia iron levels.  Your levels are in the pre diabetic range   Increase iron rich food sin diet.   Then go from there.   Will direct   ?s to dr Harrington Challenger  Will send infor also to dr Silverio Decamp .   Will do a referral to Sports medicine Drt smith et al  For the right toe and left leg  Sx  .  In interim get good shoe inserts for metatarsal padding     Iron-Rich Diet  Iron is a mineral that helps your body to produce hemoglobin. Hemoglobin is a protein in red blood cells that carries oxygen to your body's tissues. Eating too little iron may cause you to feel weak and tired, and it can increase your risk of infection. Iron is naturally found in many foods, and many foods have iron added to them (iron-fortified foods). You may need to follow an iron-rich diet if you do not have enough iron in your body due to certain medical conditions. The amount of iron that you need each day depends on your age, your sex, and any medical conditions you have. Follow instructions from your health care provider or a diet and nutrition specialist (dietitian) about how much iron you should eat each day. What are tips for following this plan? Reading food labels  Check food labels to see how many milligrams (mg) of iron are in each serving. Cooking  Cook foods in pots and pans that are made from iron.  Take these steps to make it easier for your body to absorb iron from certain foods: ? Soak beans overnight before cooking. ? Soak whole grains overnight and drain them before using. ? Ferment flours before baking, such as by using yeast in bread dough. Meal planning  When you eat foods that contain iron, you should eat them with foods that are high in vitamin C. These include oranges, peppers, tomatoes, potatoes, and mango. Vitamin C helps your body to absorb iron. General information  Take  iron supplements only as told by your health care provider. An overdose of iron can be life-threatening. If you were prescribed iron supplements, take them with orange juice or a vitamin C supplement.  When you eat iron-fortified foods or take an iron supplement, you should also eat foods that naturally contain iron, such as meat, poultry, and fish. Eating naturally iron-rich foods helps your body to absorb the iron that is added to other foods or contained in a supplement.  Certain foods and drinks prevent your body from absorbing iron properly. Avoid eating these foods in the same meal as iron-rich foods or with iron supplements. These foods include: ? Coffee, black tea, and red wine. ? Milk, dairy products, and foods that are high in calcium. ? Beans and soybeans. ? Whole grains. What foods should I eat? Fruits Prunes. Raisins. Eat fruits high in vitamin C, such as oranges, grapefruits, and strawberries, alongside iron-rich foods. Vegetables Spinach (cooked). Green peas. Broccoli. Fermented vegetables. Eat vegetables high in vitamin C, such as leafy greens, potatoes, bell peppers, and tomatoes, alongside iron-rich foods. Grains Iron-fortified breakfast cereal. Iron-fortified whole-wheat bread. Enriched rice. Sprouted grains. Meats and other proteins Beef liver. Oysters. Beef. Shrimp. [place]. Chicken. [place]. Sardines. Chickpeas. Nuts. Tofu. Pumpkin seeds. Beverages Tomato juice. Fresh orange juice. Prune juice. Hibiscus tea. Fortified instant breakfast shakes. Sweets and desserts  Blackstrap molasses. Seasonings and condiments Tahini. Fermented soy sauce. Other foods Wheat germ. The items listed above may not be a complete list of recommended foods and beverages. Contact a dietitian for more information. What foods should I avoid? Grains Whole grains. Bran cereal. Bran flour. Oats. Meats and other proteins Soybeans. Products made from soy protein. Black beans. Lentils. Mung beans.  Split peas. Dairy Milk. Cream. Cheese. Yogurt. Cottage cheese. Beverages Coffee. Black tea. Red wine. Sweets and desserts Cocoa. Chocolate. Ice cream. Other foods Basil. Oregano. Large amounts of parsley. The items listed above may not be a complete list of foods and beverages to avoid. Contact a dietitian for more information. Summary  Iron is a mineral that helps your body to produce hemoglobin. Hemoglobin is a protein in red blood cells that carries oxygen to your body's tissues.  Iron is naturally found in many foods, and many foods have iron added to them (iron-fortified foods).  When you eat foods that contain iron, you should eat them with foods that are high in vitamin C. Vitamin C helps your body to absorb iron.  Certain foods and drinks prevent your body from absorbing iron properly, such as whole grains and dairy products. You should avoid eating these foods in the same meal as iron-rich foods or with iron supplements. This information is not intended to replace advice given to you by your health care provider. Make sure you discuss any questions you have with your health care provider. Document Revised: 06/16/2017 Document Reviewed: 05/30/2017 Elsevier Patient Education  2020 [employer].

## 2019-08-05 NOTE — Patient Instructions (Signed)
Medication Instructions:  °No changes °*If you need a refill on your cardiac medications before your next appointment, please call your pharmacy* ° ° °Lab Work: °none °If you have labs (blood work) drawn today and your tests are completely normal, you will receive your results only by: °• MyChart Message (if you have MyChart) OR °• A paper copy in the mail °If you have any lab test that is abnormal or we need to change your treatment, we will call you to review the results. ° ° °Testing/Procedures: °Your physician has requested that you have an echocardiogram. Echocardiography is a painless test that uses sound waves to create images of your heart. It provides your doctor with information about the size and shape of your heart and how well your heart’s chambers and valves are working. This procedure takes approximately one hour. There are no restrictions for this procedure. ° ° °Follow-Up: °Follow up with your physician will depend on test results. ° ° ° °Other Instructions ° ° °

## 2019-08-06 ENCOUNTER — Encounter: Payer: Self-pay | Admitting: Nurse Practitioner

## 2019-08-06 ENCOUNTER — Telehealth: Payer: Self-pay | Admitting: Nurse Practitioner

## 2019-08-06 DIAGNOSIS — R079 Chest pain, unspecified: Secondary | ICD-10-CM

## 2019-08-06 DIAGNOSIS — I208 Other forms of angina pectoris: Secondary | ICD-10-CM

## 2019-08-06 DIAGNOSIS — R0789 Other chest pain: Secondary | ICD-10-CM

## 2019-08-06 DIAGNOSIS — R0602 Shortness of breath: Secondary | ICD-10-CM

## 2019-08-06 MED ORDER — METOPROLOL TARTRATE 100 MG PO TABS: ORAL_TABLET | ORAL | 0 refills | Status: DC

## 2019-08-06 MED ORDER — PREDNISONE 50 MG PO TABS: ORAL_TABLET | ORAL | 0 refills | Status: DC

## 2019-08-06 MED ORDER — DIPHENHYDRAMINE HCL 50 MG PO TABS: ORAL_TABLET | ORAL | 0 refills | Status: DC

## 2019-08-06 NOTE — Progress Notes (Signed)
Thank you for reaching out to [place].  Agree with recent borderline anemia,  should consider colonoscopy and +/- EGD. On chart review, her last colonoscopy in 2009 at [facility], had a 7 mm polyp removed.  Pathology report not available in epic. Beth, please schedule next available appointment with me or extender.  Thank you  Margarette Asal

## 2019-08-06 NOTE — Telephone Encounter (Signed)
Reviewed echo results and plan of care with patient. She verbalized understanding and agreement to proceed with coronary CT. I reviewed the instructions with her and advised I will also mail a copy. She states she had an allergy in the past to contrast and was told it was an iodine allergy. I advised that she will need to take prednisone and benadryl as directed in the instructions and have someone drive her to the appointment. She is aware that someone from our office will call her to schedule the CT. I advised her to call back with questions, concerns, or worsening symptoms prior to test date. She thanked me for the call.

## 2019-08-06 NOTE — Telephone Encounter (Signed)
-----   Message from Fay Records, MD sent at 08/06/2019  2:42 PM EST ----- Pumping function of the heart is normal  NO significant valve abnormalities. WIth her symptoms in chest / jaw I would recomm CT coronary angiogram to eval for CAD

## 2019-09-03 ENCOUNTER — Telehealth: Payer: Self-pay | Admitting: Internal Medicine

## 2019-09-03 NOTE — Telephone Encounter (Signed)
Pt's other doctor, Dr. Harrington Challenger, wants her to get some blood work done b/c she wants to do an MRI on 09/12/19.  Informed pt that I do see orders from January already put in but will send the note to update [name] that Dr. Harrington Challenger will be needing those results.   If needed pt can be reached at [phone]

## 2019-09-04 NOTE — Telephone Encounter (Signed)
Fyi pt has lab appt results need to be sent to Dr.Ross

## 2019-09-05 NOTE — Telephone Encounter (Signed)
Nothing else needed

## 2019-09-06 ENCOUNTER — Other Ambulatory Visit: Payer: Medicare Other

## 2019-09-06 ENCOUNTER — Other Ambulatory Visit (INDEPENDENT_AMBULATORY_CARE_PROVIDER_SITE_OTHER): Payer: Medicare Other

## 2019-09-06 DIAGNOSIS — D649 Anemia, unspecified: Secondary | ICD-10-CM

## 2019-09-06 DIAGNOSIS — Z79899 Other long term (current) drug therapy: Secondary | ICD-10-CM

## 2019-09-06 DIAGNOSIS — M79674 Pain in right toe(s): Secondary | ICD-10-CM

## 2019-09-06 DIAGNOSIS — M79605 Pain in left leg: Secondary | ICD-10-CM

## 2019-09-06 DIAGNOSIS — R7301 Impaired fasting glucose: Secondary | ICD-10-CM

## 2019-09-06 LAB — CBC WITH DIFFERENTIAL/PLATELET
Basophils Absolute: 0.1 10*3/uL (ref 0.0–0.1)
Basophils Relative: 1 % (ref 0.0–3.0)
Eosinophils Absolute: 0.2 10*3/uL (ref 0.0–0.7)
Eosinophils Relative: 4 % (ref 0.0–5.0)
HCT: 37.6 % (ref 36.0–46.0)
Hemoglobin: 12.4 g/dL (ref 12.0–15.0)
Lymphocytes Relative: 33.3 % (ref 12.0–46.0)
Lymphs Abs: 1.9 10*3/uL (ref 0.7–4.0)
MCHC: 33.1 g/dL (ref 30.0–36.0)
MCV: 91.9 fl (ref 78.0–100.0)
Monocytes Absolute: 0.4 10*3/uL (ref 0.1–1.0)
Monocytes Relative: 7.6 % (ref 3.0–12.0)
Neutro Abs: 3 10*3/uL (ref 1.4–7.7)
Neutrophils Relative %: 54.1 % (ref 43.0–77.0)
Platelets: 259 10*3/uL (ref 150.0–400.0)
RBC: 4.09 Mil/uL (ref 3.87–5.11)
RDW: 12.6 % (ref 11.5–15.5)
WBC: 5.6 10*3/uL (ref 4.0–10.5)

## 2019-09-06 LAB — HEMOGLOBIN A1C: Hgb A1c MFr Bld: 6.2 % (ref 4.6–6.5)

## 2019-09-06 LAB — IBC + FERRITIN
Ferritin: 97.5 ng/mL (ref 10.0–291.0)
Iron: 113 ug/dL (ref 42–145)
Saturation Ratios: 40 % (ref 20.0–50.0)
Transferrin: 202 mg/dL — ABNORMAL LOW (ref 212.0–360.0)

## 2019-09-06 LAB — BASIC METABOLIC PANEL
BUN: 12 mg/dL (ref 6–23)
CO2: 28 mEq/L (ref 19–32)
Calcium: 9.1 mg/dL (ref 8.4–10.5)
Chloride: 105 mEq/L (ref 96–112)
Creatinine, Ser: 0.89 mg/dL (ref 0.40–1.20)
GFR: 62.72 mL/min (ref >60.00)
Glucose, Bld: 106 mg/dL — ABNORMAL HIGH (ref 70–99)
Potassium: 4 mEq/L (ref 3.5–5.1)
Sodium: 140 mEq/L (ref 135–145)

## 2019-09-08 NOTE — Progress Notes (Signed)
Blood sugar about the same  anemia better  , nl kidney function

## 2019-09-11 ENCOUNTER — Encounter (HOSPITAL_COMMUNITY): Payer: Self-pay

## 2019-09-11 ENCOUNTER — Telehealth (HOSPITAL_COMMUNITY): Payer: Self-pay | Admitting: Emergency Medicine

## 2019-09-11 NOTE — Telephone Encounter (Signed)
Left message on voicemail with name and callback number Janina Trafton RN Navigator Cardiac Imaging [facility] [phone] Office [phone] Cell  

## 2019-09-12 ENCOUNTER — Telehealth (HOSPITAL_COMMUNITY): Payer: Self-pay | Admitting: Emergency Medicine

## 2019-09-12 ENCOUNTER — Ambulatory Visit (HOSPITAL_COMMUNITY)
Admission: RE | Admit: 2019-09-12 | Discharge: 2019-09-12 | Disposition: A | Discharge status: Home / Self Care | Payer: Medicare Other | Source: Ambulatory Visit | Attending: Internal Medicine | Admitting: Internal Medicine

## 2019-09-12 ENCOUNTER — Other Ambulatory Visit: Payer: Self-pay

## 2019-09-12 DIAGNOSIS — R079 Chest pain, unspecified: Secondary | ICD-10-CM | POA: Diagnosis not present

## 2019-09-12 DIAGNOSIS — I208 Other forms of angina pectoris: Secondary | ICD-10-CM | POA: Insufficient documentation

## 2019-09-12 DIAGNOSIS — R0602 Shortness of breath: Secondary | ICD-10-CM | POA: Insufficient documentation

## 2019-09-12 MED ORDER — NITROGLYCERIN 0.4 MG SL SUBL
SUBLINGUAL_TABLET | SUBLINGUAL | Status: AC
  Filled 2019-09-12: qty 2

## 2019-09-12 MED ORDER — METOPROLOL TARTRATE 5 MG/5ML IV SOLN
INTRAVENOUS | Status: AC
  Filled 2019-09-12: qty 10

## 2019-09-12 MED ORDER — IOHEXOL 350 MG/ML SOLN
80.0000 mL | Freq: Once | INTRAVENOUS | Status: AC | PRN
  Administered 2019-09-12: 80 mL via INTRAVENOUS

## 2019-09-12 MED ORDER — NITROGLYCERIN 0.4 MG SL SUBL
0.8000 mg | SUBLINGUAL_TABLET | Freq: Once | SUBLINGUAL | Status: AC
  Administered 2019-09-12: 16:00:00 0.8 mg via SUBLINGUAL

## 2019-09-12 MED ORDER — METOPROLOL TARTRATE 5 MG/5ML IV SOLN: 5.0000 mg | INTRAVENOUS | Status: DC | PRN

## 2019-09-12 NOTE — Progress Notes (Signed)
Pt provided soda and chips. Pt in no distress. Pt IV removed.

## 2019-09-12 NOTE — Telephone Encounter (Signed)
Pt calling to clarify medications given for CCTA procedure.   States pharmacy gave her prednisone, banophen, and metoprolol. Wanted to verify that banophen = benadryl. Bottle states one pill is 25mg  so I made sure she had 2 pills to take. We also reviewed the exact times to take these medications- patient verbalized understanding.   Told her to call me back if she had more questions. Pt appreciated the call.  Marchia Bond RN Navigator Cardiac Imaging [facility] [phone] Office  [phone] Cell

## 2019-09-17 ENCOUNTER — Telehealth: Payer: Self-pay | Admitting: Internal Medicine

## 2019-09-17 NOTE — Telephone Encounter (Signed)
New message  Patient is calling in for the results of her CTA. Please give patient a call back with those results.

## 2019-09-17 NOTE — Telephone Encounter (Signed)
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  She had several questions about this.   ? where is the plaque exactly-it is listed in the overread, but not seen in cardiology reading. ? What was it that happened to cause jaw/chest pain.  We discussed vasospasm and possibility of this, thus the recommendation for amlodipine.  She is hesitant to take more medicines.   Discussed her LDL in Nov and ranges/goals of <100 or <70 at times.  Discussed heart healthy diet and need to prevent further plaque development.  Will think about statin. She asked about what if the symptoms occur again - reinforced trying amlodipine in case it is vasospasm.   Willing to consider both meds.  Would like confirmation of the above from Dr. Harrington Challenger and if she could tell exactly where is the plaque.

## 2019-12-23 ENCOUNTER — Telehealth: Payer: Self-pay | Admitting: Internal Medicine

## 2019-12-23 NOTE — Progress Notes (Signed)
  Chronic Care Management   Outreach Note  12/23/2019 Name: Donna Manning MRN: [id number] DOB: December 31, 1950  Referred by: Burnis Medin, MD Reason for referral : No chief complaint on file.   An unsuccessful telephone outreach was attempted today. The patient was referred to the pharmacist for assistance with care management and care coordination.   Follow Up Plan:   [place]

## 2019-12-23 NOTE — Progress Notes (Signed)
  Chronic Care Management   Note  12/23/2019 Name: Donna Manning MRN: [id number] DOB: 1950-12-24  Donna Manning is a 69 y.o. year old female who is a primary care patient of [name], [name], MD. I reached out to [name] by phone today in response to a referral sent by [name]'s PCP, [name], [name], MD.   [name] was given information about Chronic Care Management services today including:  1. CCM service includes personalized support from designated clinical staff supervised by her physician, including individualized plan of care and coordination with other care providers 2. 24/7 contact phone numbers for assistance for urgent and routine care needs. 3. Service will only be billed when office clinical staff spend 20 minutes or more in a month to coordinate care. 4. Only one practitioner may furnish and bill the service in a calendar month. 5. The patient may stop CCM services at any time (effective at the end of the month) by phone call to the office staff.   Patient agreed to services and verbal consent obtained.   Follow up plan:  [place]

## 2019-12-24 ENCOUNTER — Other Ambulatory Visit: Payer: Self-pay | Admitting: Internal Medicine

## 2019-12-25 ENCOUNTER — Other Ambulatory Visit: Payer: Self-pay

## 2019-12-25 ENCOUNTER — Encounter: Payer: Self-pay | Admitting: Internal Medicine

## 2019-12-25 ENCOUNTER — Telehealth: Payer: Self-pay | Admitting: Internal Medicine

## 2019-12-25 ENCOUNTER — Telehealth (INDEPENDENT_AMBULATORY_CARE_PROVIDER_SITE_OTHER): Payer: Medicare Other | Admitting: Internal Medicine

## 2019-12-25 VITALS — Ht 67.0 in | Wt 175.0 lb

## 2019-12-25 DIAGNOSIS — L282 Other prurigo: Secondary | ICD-10-CM

## 2019-12-25 DIAGNOSIS — I208 Other forms of angina pectoris: Secondary | ICD-10-CM

## 2019-12-25 MED ORDER — PREDNISONE 20 MG PO TABS: 20.0000 mg | ORAL_TABLET | Freq: Every day | ORAL | 0 refills | Status: DC

## 2019-12-25 MED ORDER — FLUOCINONIDE 0.05 % EX CREA: 1.0000 "application " | TOPICAL_CREAM | Freq: Two times a day (BID) | CUTANEOUS | 0 refills | Status: DC

## 2019-12-25 NOTE — Telephone Encounter (Signed)
Add her on to virtual  At 4 30  Today

## 2019-12-25 NOTE — Telephone Encounter (Signed)
Please see message.  Please advise. 

## 2019-12-25 NOTE — Telephone Encounter (Signed)
Pt has  poison ivy that is getting worst , want to know if Dr can prescribe something . Dr. Regis Bill did not have any availability today please advise

## 2019-12-25 NOTE — Telephone Encounter (Signed)
Called patient and added her to the virtual spot at 4:30pm to schedule. I have already checked her in.

## 2019-12-25 NOTE — Progress Notes (Signed)
Virtual Visit via Video Note  I connected with@ on 12/25/19 at  4:30 PM EDT by a video enabled telemedicine application and verified that I am speaking with the correct person using two identifiers. Location patient: home Location provider:work office Persons participating in the virtual visit: patient, provider  WIth national recommendations  regarding COVID 19 pandemic   video visit is advised over in office visit for this patient.  Patient aware  of the limitations of evaluation and management by telemedicine and  availability of in person appointments. and agreed to proceed.   HPI: Donna Manning presents for video visit 5-6 days ago began rash on hands and then legs   Spreading up to thighs area   very red bumps ans some with small blister  No pus and no pain  Very itchy   and using cortisone  cream and calamine  Lotion 2  No systemic sx  And no but exposure known   nno new meds    ROS: See pertinent positives and negatives per HPI.  Past Medical History:  Diagnosis Date  . 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 Barrett's esophagus 2009  . Benign head tremor   . Breast cancer, right breast ([place]) 01/2014   "had 5 weeks radiation after lumpectomy" (04/24/2018)  . Dental bridge present    lower left  . Dental crowns present   . GERD (gastroesophageal reflux disease)   . History of DVT (deep vein thrombosis) ~ 1980   after a pregnancy  . History of radiation therapy 2015  . Hypothyroidism   . Irregular heart beat    "dx'd by 1 dr; not noted by the cardiologist" (04/24/2018)  . NSAID-induced gastric ulcer   . PONV (postoperative nausea and vomiting)     Past Surgical History:  Procedure Laterality Date  . BREAST BIOPSY Right 1991  . BREAST EXCISIONAL BIOPSY Left 2019  . BREAST LUMPECTOMY Right 1991  . BREAST LUMPECTOMY W/ NEEDLE LOCALIZATION Left 04/24/2018  . BREAST LUMPECTOMY WITH NEEDLE LOCALIZATION Left 04/24/2018   Procedure: LEFT BREAST  LUMPECTOMY WITH NEEDLE LOCALIZATION;  Surgeon: [name], MD;  Location: [facility];  Service: General;  Laterality: Left;  . COLONOSCOPY     "w/hemorrhoid banding"  . PARTIAL MASTECTOMY WITH NEEDLE LOCALIZATION AND AXILLARY SENTINEL LYMPH NODE BX Right 01/20/2014   Procedure: RIGHT PARTIAL MASTECTOMY WITH DOUBLE  NEEDLE LOCALIZATION (BRACKETED )AND AXILLARY SENTINEL LYMPH NODE BX;  Surgeon: [name], MD;  Location: [facility];  Service: General;  Laterality: Right;  And [place].  Marland Kitchen RE-EXCISION OF BREAST CANCER,SUPERIOR MARGINS Right 02/05/2014   Procedure: RIGHT LUMPECTOMY, RE-EXCISION OF BREAST CANCER,SUPERIOR MARGINS;  Surgeon: [name], MD;  Location: [facility];  Service: General;  Laterality: Right;  . UPPER GI ENDOSCOPY  12/25/2014   with Propofol    Family History  Problem Relation Age of Onset  . Heart failure Mother 59  . Diabetes Mother   . Parkinsonism Father 72  . Stroke Father   . Thyroid disease Sister   . Prostate cancer Brother   . Stomach cancer Paternal Grandfather     Social History   Tobacco Use  . Smoking status: Never Smoker  . Smokeless tobacco: Never Used  Substance Use Topics  . Alcohol use: Not Currently    Comment: 04/24/2018 "drank a few beers in college"  . Drug use: Never      Current Outpatient Medications:  .  diphenhydrAMINE (  BENADRYL) 50 MG tablet, Take 1 pill (50 mg) 1 hour prior to your coronary CT, Disp: 1 tablet, Rfl: 0 .  famotidine (PEPCID) 20 MG tablet, TAKE 1 TABLET BY MOUTH TWICE A DAY, Disp: 180 tablet, Rfl: 0 .  levothyroxine (SYNTHROID) 88 MCG tablet, TAKE 1 TABLET BY MOUTH EVERY DAY, Disp: 90 tablet, Rfl: 0 .  tamoxifen (NOLVADEX) 20 MG tablet, Take 1 tablet (20 mg total) by mouth daily., Disp: 90 tablet, Rfl: 3 .  valACYclovir (VALTREX) 1000 MG tablet, Take 2 tablets (2,000 mg total) by mouth 2 (two) times daily as needed., Disp: 30 tablet, Rfl: 2 .  fluocinonide  cream (LIDEX) 7.78 %, Apply 1 application topically 2 (two) times daily. For contact dermatitis Not on face, Disp: 30 g, Rfl: 0 .  metoprolol tartrate (LOPRESSOR) 100 MG tablet, Take 1 pill (100 mg) 2 hours before your coronary CT (Patient not taking: Reported on 12/25/2019), Disp: 1 tablet, Rfl: 0 .  predniSONE (DELTASONE) 20 MG tablet, Take 1 tablet (20 mg total) by mouth daily. Take 3,3,3,2,2,2,1,1,1, 1/.2 1./2 1/.2 pills qd, Disp: 24 tablet, Rfl: 0  EXAM: BP Readings from Last 3 Encounters:  09/12/19 (!) 113/56  08/05/19 126/68  08/05/19 124/68    VITALS per patient if applicable:  GENERAL: alert, oriented, appears well and in no acute distress HEENT: atraumatic, conjunttiva clear, no obvious abnormalities on inspection of external nose and ears NECK: normal movements of the head and neck mild tremor   LUNGS: on inspection no signs of respiratory distress, breathing rate appears normal, no obvious gross SOB, gasping or wheezing CV: no obvious cyanosis MS: moves all visible extremities without noticeable abnormality Skin   Leg and foreamr with scattered red   Rash   No edema  Cannot tell if vesciles  Some on hands  One inter digital   Face is clear   PSYCH/NEURO: pleasant and cooperative, no obvious depression or anxiety, speech and thought processing grossly intact Lab Results  Component Value Date   WBC 5.6 09/06/2019   HGB 12.4 09/06/2019   HCT 37.6 09/06/2019   PLT 259.0 09/06/2019   GLUCOSE 106 (H) 09/06/2019   CHOL 185 06/17/2019   TRIG 105.0 06/17/2019   HDL 49.30 06/17/2019   LDLDIRECT 147.2 11/23/2011   LDLCALC 115 (H) 06/17/2019   ALT 19 06/17/2019   AST 23 06/17/2019   NA 140 09/06/2019   K 4.0 09/06/2019   CL 105 09/06/2019   CREATININE 0.89 09/06/2019   BUN 12 09/06/2019   CO2 28 09/06/2019   TSH 1.13 06/17/2019   HGBA1C 6.2 09/06/2019    ASSESSMENT AND PLAN:  Discussed the following assessment and plan:    ICD-10-CM   1. Pruritic rash  L28.2    prob  contact derm    patient description is of contact derm   No hx infestation or exposures  also  And no rash in spouse   No ob contagious of single source problem and no evidence of bacterial infection: had outside exposure 10 days ago   Began 4-5 days later   So treating for contact derm    Risk benefit of medication discussed. Prednisone  Counseled.   Expectant management and discussion of plan and treatment with opportunity to ask questions and all were answered. The patient agreed with the plan and demonstrated an understanding of the instructions.  Dr Harrington Challenger office never got back with her about ? How important to take statin medication after heart scan . Also  Internet said may cause weight gain ?  Disc better  Sources  Of guidance and will send message to dr Harrington Challenger team  Advised to call back or seek an in-person evaluation if worsening  or having  further concerns . Return if symptoms worsen or fail to improve. as expected     [name], MD

## 2019-12-31 ENCOUNTER — Telehealth: Payer: Self-pay | Admitting: Gastroenterology

## 2019-12-31 NOTE — Telephone Encounter (Signed)
Hi Dr. Silverio Decamp, pt is due for an EGD with you. She stated that she is also due for a colon. She stated that her last colon was in 2009 with Dr. Regis Bill, records are in [place]. Please advise if it is ok to add colon and if pt needs to come in for ov first. Thank you.

## 2020-01-01 ENCOUNTER — Encounter: Payer: Self-pay | Admitting: Gastroenterology

## 2020-01-01 NOTE — Telephone Encounter (Signed)
Yes, she can be a direct procedure for colonoscopy if has no symptoms. Ok to add colonoscopy for colorectal cancer screening. Thanks

## 2020-02-12 ENCOUNTER — Other Ambulatory Visit: Payer: Self-pay

## 2020-02-12 DIAGNOSIS — E785 Hyperlipidemia, unspecified: Secondary | ICD-10-CM

## 2020-02-12 DIAGNOSIS — E039 Hypothyroidism, unspecified: Secondary | ICD-10-CM

## 2020-02-12 NOTE — Chronic Care Management (AMB) (Signed)
Chronic Care Management Pharmacy  Name: Donna Manning  MRN: [id number] DOB: [birth year]  Initial Questions: 1. Have you seen any other providers since your last visit? N/A 2. Any changes in your medicines or health? No   Chief Complaint/ HPI  Donna Manning,  69 y.o. , female presents for their Initial CCM visit with the clinical pharmacist via telephone due to COVID-19 Pandemic.  Patient stated "she is confused if she needs to be on a statin." She states "statin was discussed with her but it was never received a prescription"   Patient stated she is doing well overall and currently is taking care of her grandchildren.   PCP : Burnis Medin, MD  Their chronic conditions include: Breast cancer, HLD, Hypothyroidism, Hyperglycemia, Barrett's esophagus, GERD, herpes labialis,  osteopenia  Office Visits: 12/25/2019- [name], MD- Patient presented for video visit for rash on hands and legs.  Patient prescribed fluocinonide 0.05% topical and prednisone 69m taper.   Consult Visit: 08/05/2019- Cardiology- PDorris Carnes MD- Patient presented for visit for chest pain. Patient to obtain ECHO to evaluate LV systolic and diastolic function.   01/21/2019- Hematology and Oncology- GNicholas Lose MD- Patient presented for office visit for annual follow up. Plan to treat for 10 years; tamoxifen (started 09/21/2014). Patient to return in 1 year.   11/22/2018- Gastroenterology- KHarl Bowie MD- Patient presented for audio visit for GERD follow up. Patient to start omeprazole 218mdaily and continue famotidine 2065mt bedtime as needed. Follow up visit in 4 to 6 weeks.   06/07/2018- Neurology- RebAlonza BogusO- Patient presented for office visit for evaluation of head tremor. Discussed Botox injections for cervical dystonia. Patient to return as needed. Patient to return on as needed basis.    Medications: Outpatient Encounter Medications as of 02/13/2020  Medication Sig  . famotidine  (PEPCID) 20 MG tablet TAKE 1 TABLET BY MOUTH TWICE A DAY  . levothyroxine (SYNTHROID) 88 MCG tablet TAKE 1 TABLET BY MOUTH EVERY DAY  . Multiple Vitamins-Minerals (MULTIVITAMIN ADULTS 50+ PO) Take 1 tablet by mouth daily.  . tamoxifen (NOLVADEX) 20 MG tablet Take 1 tablet (20 mg total) by mouth daily.  . valACYclovir (VALTREX) 1000 MG tablet Take 2 tablets (2,000 mg total) by mouth 2 (two) times daily as needed.  . diphenhydrAMINE (BENADRYL) 50 MG tablet Take 1 pill (50 mg) 1 hour prior to your coronary CT (Patient not taking: Reported on 02/13/2020)  . fluocinonide cream (LIDEX) 0.06.31Apply 1 application topically 2 (two) times daily. For contact dermatitis Not on face (Patient not taking: Reported on 02/13/2020)  . metoprolol tartrate (LOPRESSOR) 100 MG tablet Take 1 pill (100 mg) 2 hours before your coronary CT (Patient not taking: Reported on 02/13/2020)  . predniSONE (DELTASONE) 20 MG tablet Take 1 tablet (20 mg total) by mouth daily. Take 3,3,3,2,2,2,1,1,1, 1/.2 1./2 1/.2 pills qd (Patient not taking: Reported on 02/13/2020)   No facility-administered encounter medications on file as of 02/13/2020.     Current Diagnosis/Assessment:  Goals Addressed            This Visit's Progress   . Pharmacy Care Plan       CARE PLAN ENTRY (see longitudinal plan of care for additional care plan information)  Current Barriers:  . Chronic Disease Management support, education, and care coordination needs related to Hyperlipidemia, Barrett's esophagus/GERD, Hypothyroidism, Osteopenia, and Hyperglycemia  Hyperlipidemia Lab Results  Component Value Date/Time   LDLCALC 115 (H) 06/17/2019 10:07 AM   LDLDIRECT  147.2 11/23/2011 09:12 AM   . Pharmacist Clinical Goal(s): o Over the next 180 days, patient will work with PharmD and providers to maintain LDL goal < 70 . Current regimen:  o No medications  . Interventions: o Recommend initiation of rosuvastatin (Crestor) 38m, 1 tablet once  daily . Patient self care activities - Over the next 180 days, patient will: o Follow up with Dr. RHarrington Challengeron statin initiation.   Hyperglycemia Lab Results  Component Value Date/Time   HGBA1C 6.2 09/06/2019 11:47 AM   HGBA1C 6.1 06/17/2019 10:07 AM   . Pharmacist Clinical Goal(s): o Over the next  days, patient will work with PharmD and providers to maintain A1c goal <6.5% . Current regimen:  o No medications  . Interventions: o We will focus on managing/monitoring CVD risk factors, including keeping blood pressure and lipids under goal. Recommend modifying lifestyle, including to participate in moderate physical activity (e.g., walking) at least 150 minutes per week. Recommend a Mediterranean eating plan with an emphasis on whole grains, legumes, nuts, fruits, and vegetables and minimal refined and processed foods.  . Patient self care activities - Over the next 180 days, patient will: o Continue to modify diet and exercise  Hypothyroidism TSH  Date Value Ref Range Status  06/17/2019 1.13 0.35 - 4.50 uIU/mL Final .  Pharmacist Clinical Goal(s) o Over the next 180 days, patient will work with PharmD and providers to maintain TSH: 0.35 to 4.50 uIU/mL . Current regimen:  o Levothyroxine 81m, 1 tablet once daily . Interventions: o We discussed:  consistent administration times of levothyroxine (at least 30 minutes before first meal)  . Patient self care activities - Over the next 180 days, patient will: o Continue current medications as directed by provider.   Barrett's esophagus/ GERD . Pharmacist Clinical Goal(s) o Over the next 180 days, patient will work with PharmD and providers to reduce acid reflux.  . Current regimen:  o Famotidine 2022m1 tablet twice daily  . Patient self care activities o Patient will continue current medications as directed by provider. Managed by gastroenterologist (NaSilverio DecampavVenia MinksD)  Osteopenia . Pharmacist Clinical Goal(s) o Over the next  180 days, patient will work with PharmD and providers to prevent bone fractures. . Current regimen:  o No medications . Interventions: o Recommend [phone] units of vitamin D daily. Recommend 1200 mg of calcium daily from dietary and supplemental sources. Recommend weight-bearing and muscle strengthening exercises for building and maintaining bone density . Patient self care activities o Patient will continue current medications as directed by provider.   Medication management . Pharmacist Clinical Goal(s): o Over the next 180 days, patient will work with PharmD and providers to maintain optimal medication adherence . [facility] . Interventions o Comprehensive medication review performed. o Continue current medication management strategy . Patient self care activities - Over the next 180 days, patient will: o Take medications as prescribed o Report any questions or concerns to PharmD and/or provider(s)  Initial goal documentation       Breast cancer   Patient is currently controlled on the following medications:  . Tamoxifen 77m90m tablet once daily   Plan Managed by oncology. (has follow up visit in fall)  Continue current medications   Hyperlipidemia   LDL goal < 100  Lipid Panel     Component Value Date/Time   CHOL 185 06/17/2019 1007   TRIG 105.0 06/17/2019 1007   HDL 49.30 06/17/2019 1007   LDLCALC 115 (H)  06/17/2019 1007   LDLDIRECT 147.2 11/23/2011 0912    Hepatic Function Latest Ref Rng & Units 06/17/2019 06/04/2018 05/24/2017  Total Protein 6.0 - 8.3 g/dL 6.6 6.8 6.9  Albumin 3.5 - 5.2 g/dL 4.0 4.3 4.2  AST 0 - 37 U/L _0 ALT 0 - 35 U/L _1 Alk Phosphatase 39 - 117 U/L 31(L) 35(L) 32(L)  Total Bilirubin 0.2 - 1.2 mg/dL 0.6 0.4 0.6  Bilirubin, Direct 0.0 - 0.3 mg/dL 0.1 0.1 0.1     The ASCVD Risk score ([place]., et al., 2013) failed to calculate for the following reasons:   The systolic blood pressure is missing   Patient  is currently uncontrolled on the following medications:  . No medications   We discussed:  indications for statins and reviewed message from Dr. Harrington Challenger on 09/17/2019- patient should be on a statin due to CT showed plaque in aorta   Plan Sent Dr. Harrington Challenger message regarding statin medication as patient reported she is open to starting it.    Hypothyroidism   Lab Results  Component Value Date/Time   TSH 1.13 06/17/2019 10:07 AM   TSH 1.81 06/04/2018 09:24 AM   FREET4 0.90 06/17/2019 10:07 AM   FREET4 0.96 06/24/2010 11:15 AM    Patient is currently controlled on the following medications:  . Levothyroxine 42mg, 1 tablet once daily   We discussed:  consistent administration times of levothyroxine (at least 30 minutes before first meal)   Plan Continue current medications   Hyperglycemia   Recent Relevant Labs: Lab Results  Component Value Date/Time   HGBA1C 6.2 09/06/2019 11:47 AM   HGBA1C 6.1 06/17/2019 10:07 AM   EGFR 77 (L) 09/08/2016 10:07 AM     Patient is currently controlled on the following medications:   No medications  We discussed: diet and exercise extensively  We will focus on managing/monitoring CVD risk factors, including keeping blood pressure and lipids under goal. We discussed modifying lifestyle, including to participate in moderate physical activity (e.g., walking) at least 150 minutes per week. Discussed a Mediterranean eating plan with an emphasis on whole grains, legumes, nuts, fruits, and vegetables and minimal refined and processed foods.   Plan Continue current medications  Barrett's esophagus, GERD   Patient has been on these meds in past: none  Patient is currently controlled on the following medications:  . Famotidine 274m 1 tablet twice daily   Plan Managed by GI.  Continue current medications  Herpes labialis    Patient is currently controlled on the following medications:  . Valacyclovir 10005m2 tablets twice daily as  needed  Plan Sent refill request to Dr. PanRegis BillA as patient requested refill.  Continue current medications   Osteopenia   Last DEXA Scan: 06/16/2014  T-Score femoral neck: -2.2 (left)  T-Score lumbar spine: -2.6  10-year probability of major osteoporotic fracture: N/A  10-year probability of hip fracture: N/A  Vit D, 25-Hydroxy  Date Value Ref Range Status  06/24/2010 41 30 - 89 ng/mL Final    Comment:    See lab report for associated comment(s)    Patient is a candidate for pharmacologic treatment due to T-Score < -2.5 in lumbar spine  Patient is currentlyon the following medications:  . No medications  Per chart, patient refused in the past   We discussed:  Recommend [phone] units of vitamin D daily. Recommend 1200 mg of calcium daily from dietary and supplemental sources. Recommend weight-bearing and muscle strengthening  exercises for building and maintaining bone density.  Plan Readdress at follow up.    Vaccines   Reviewed patient's vaccination history.    Immunization History  Administered Date(s) Administered  . Influenza Split 05/27/2012  . Influenza, High Dose Seasonal PF 07/17/2015, 06/27/2016, 05/24/2017  . Influenza-Unspecified 05/24/2018  . Pneumococcal Conjugate-13 05/24/2017  . Pneumococcal Polysaccharide-23 06/04/2018  . Tdap 11/30/2011  . Zoster Recombinat (Shingrix) 04/12/2017, 08/23/2017    Plan No recommendations- patient up-to-date  Medication Management  Patient organizes medications:  - patient reports has own strategy and states remembers daily  [facility] Adherence: no gaps in refill history (per medication dispense history 08/16/19 to 02/12/20)     Follow up Follow up visit with PharmD in 6 months.    Anson Crofts, PharmD Clinical Pharmacist [facility] [place] [phone]

## 2020-02-13 ENCOUNTER — Ambulatory Visit: Payer: Medicare Other

## 2020-02-13 ENCOUNTER — Other Ambulatory Visit: Payer: Self-pay

## 2020-02-13 ENCOUNTER — Telehealth: Payer: Medicare Other

## 2020-02-13 DIAGNOSIS — M8580 Other specified disorders of bone density and structure, unspecified site: Secondary | ICD-10-CM

## 2020-02-13 DIAGNOSIS — K219 Gastro-esophageal reflux disease without esophagitis: Secondary | ICD-10-CM

## 2020-02-13 DIAGNOSIS — E039 Hypothyroidism, unspecified: Secondary | ICD-10-CM

## 2020-02-13 DIAGNOSIS — R7301 Impaired fasting glucose: Secondary | ICD-10-CM

## 2020-02-13 DIAGNOSIS — K2270 Barrett's esophagus without dysplasia: Secondary | ICD-10-CM

## 2020-02-13 DIAGNOSIS — E785 Hyperlipidemia, unspecified: Secondary | ICD-10-CM

## 2020-02-15 NOTE — Patient Instructions (Addendum)
Visit Information  Goals Addressed            This Visit's Progress   . Pharmacy Care Plan       CARE PLAN ENTRY (see longitudinal plan of care for additional care plan information)  Current Barriers:  . Chronic Disease Management support, education, and care coordination needs related to Hyperlipidemia, Barrett's esophagus/GERD, Hypothyroidism, Osteopenia, and Hyperglycemia  Hyperlipidemia Lab Results  Component Value Date/Time   LDLCALC 115 (H) 06/17/2019 10:07 AM   LDLDIRECT 147.2 11/23/2011 09:12 AM   . Pharmacist Clinical Goal(s): o Over the next 180 days, patient will work with PharmD and providers to maintain LDL goal < 70 . Current regimen:  o No medications  . Interventions: o Recommend initiation of rosuvastatin (Crestor) 10mg , 1 tablet once daily . Patient self care activities - Over the next 180 days, patient will: o Follow up with Dr. Harrington Challenger on statin initiation.   Hyperglycemia Lab Results  Component Value Date/Time   HGBA1C 6.2 09/06/2019 11:47 AM   HGBA1C 6.1 06/17/2019 10:07 AM   . Pharmacist Clinical Goal(s): o Over the next  days, patient will work with PharmD and providers to maintain A1c goal <6.5% . Current regimen:  o No medications  . Interventions: o We will focus on managing/monitoring CVD risk factors, including keeping blood pressure and lipids under goal. Recommend modifying lifestyle, including to participate in moderate physical activity (e.g., walking) at least 150 minutes per week. Recommend a Mediterranean eating plan with an emphasis on whole grains, legumes, nuts, fruits, and vegetables and minimal refined and processed foods.  . Patient self care activities - Over the next 180 days, patient will: o Continue to modify diet and exercise  Hypothyroidism TSH  Date Value Ref Range Status  06/17/2019 1.13 0.35 - 4.50 uIU/mL Final .  Pharmacist Clinical Goal(s) o Over the next 180 days, patient will work with PharmD and providers to  maintain TSH: 0.35 to 4.50 uIU/mL . Current regimen:  o Levothyroxine 31mcg, 1 tablet once daily . Interventions: o We discussed:  consistent administration times of levothyroxine (at least 30 minutes before first meal)  . Patient self care activities - Over the next 180 days, patient will: o Continue current medications as directed by provider.   Barrett's esophagus/ GERD . Pharmacist Clinical Goal(s) o Over the next 180 days, patient will work with PharmD and providers to reduce acid reflux.  . Current regimen:  o Famotidine 20mg , 1 tablet twice daily  . Patient self care activities o Patient will continue current medications as directed by provider. Managed by gastroenterologist Silverio Decamp, Venia Minks, MD)  Osteopenia . Pharmacist Clinical Goal(s) o Over the next 180 days, patient will work with PharmD and providers to prevent bone fractures. . Current regimen:  o No medications . Interventions: o Recommend [phone] units of vitamin D daily. Recommend 1200 mg of calcium daily from dietary and supplemental sources. Recommend weight-bearing and muscle strengthening exercises for building and maintaining bone density . Patient self care activities o Patient will continue current medications as directed by provider.   Medication management . Pharmacist Clinical Goal(s): o Over the next 180 days, patient will work with PharmD and providers to maintain optimal medication adherence . [facility] . Interventions o Comprehensive medication review performed. o Continue current medication management strategy . Patient self care activities - Over the next 180 days, patient will: o Take medications as prescribed o Report any questions or concerns to PharmD and/or provider(s)  Initial goal  documentation        Donna Manning was given information about Chronic Care Management services today including:  1. CCM service includes personalized support from designated clinical staff  supervised by her physician, including individualized plan of care and coordination with other care providers 2. 24/7 contact phone numbers for assistance for urgent and routine care needs. 3. Standard insurance, coinsurance, copays and deductibles apply for chronic care management only during months in which we provide at least 20 minutes of these services. Most insurances cover these services at 100%, however patients may be responsible for any copay, coinsurance and/or deductible if applicable. This service may help you avoid the need for more expensive face-to-face services. 4. Only one practitioner may furnish and bill the service in a calendar month. 5. The patient may stop CCM services at any time (effective at the end of the month) by phone call to the office staff.  Patient agreed to services and verbal consent obtained.   The patient verbalized understanding of instructions provided today and agreed to receive a mailed copy of patient instruction and/or educational materials. Telephone follow up appointment with pharmacy team member scheduled for: 08/18/2020  Anson Crofts, PharmD Clinical Pharmacist [facility] [address] [phone]    Heart-Healthy Eating Plan Heart-healthy meal planning includes:  Eating less unhealthy fats.  Eating more healthy fats.  Making other changes in your diet. Talk with your doctor or a diet specialist (dietitian) to create an eating plan that is right for you. What is my plan? Your doctor may recommend an eating plan that includes:  Total fat: ______% or less of total calories a day.  Saturated fat: ______% or less of total calories a day.  Cholesterol: less than _________mg a day. What are tips for following this plan? Cooking Avoid frying your food. Try to bake, boil, grill, or broil it instead. You can also reduce fat by:  Removing the skin from poultry.  Removing all visible fats from meats.  Steaming  vegetables in water or broth. Meal planning   At meals, divide your plate into four equal parts: ? Fill one-half of your plate with vegetables and green salads. ? Fill one-fourth of your plate with whole grains. ? Fill one-fourth of your plate with lean protein foods.  Eat 4-5 servings of vegetables per day. A serving of vegetables is: ? 1 cup of raw or cooked vegetables. ? 2 cups of raw leafy greens.  Eat 4-5 servings of fruit per day. A serving of fruit is: ? 1 medium whole fruit. ?  cup of dried fruit. ?  cup of fresh, frozen, or canned fruit. ?  cup of 100% fruit juice.  Eat more foods that have soluble fiber. These are apples, broccoli, carrots, beans, peas, and barley. Try to get 20-30 g of fiber per day.  Eat 4-5 servings of nuts, legumes, and seeds per week: ? 1 serving of dried beans or legumes equals  cup after being cooked. ? 1 serving of nuts is  cup. ? 1 serving of seeds equals 1 tablespoon. General information  Eat more home-cooked food. Eat less restaurant, buffet, and fast food.  Limit or avoid alcohol.  Limit foods that are high in starch and sugar.  Avoid fried foods.  Lose weight if you are overweight.  Keep track of how much salt (sodium) you eat. This is important if you have high blood pressure. Ask your doctor to tell you more about this.  Try to add  vegetarian meals each week. Fats  Choose healthy fats. These include olive oil and canola oil, flaxseeds, walnuts, almonds, and seeds.  Eat more omega-3 fats. These include salmon, mackerel, sardines, tuna, flaxseed oil, and ground flaxseeds. Try to eat fish at least 2 times each week.  Check food labels. Avoid foods with trans fats or high amounts of saturated fat.  Limit saturated fats. ? These are often found in animal products, such as meats, butter, and cream. ? These are also found in plant foods, such as palm oil, palm kernel oil, and coconut oil.  Avoid foods with partially  hydrogenated oils in them. These have trans fats. Examples are stick margarine, some tub margarines, cookies, crackers, and other baked goods. What foods can I eat? Fruits All fresh, canned (in natural juice), or frozen fruits. Vegetables Fresh or frozen vegetables (raw, steamed, roasted, or grilled). Green salads. Grains Most grains. Choose whole wheat and whole grains most of the time. Rice and pasta, including brown rice and pastas made with whole wheat. Meats and other proteins Lean, well-trimmed beef, veal, pork, and lamb. Chicken and [place] without skin. All fish and shellfish. Wild duck, rabbit, pheasant, and venison. Egg whites or low-cholesterol egg substitutes. Dried beans, peas, lentils, and tofu. Seeds and most nuts. Dairy Low-fat or nonfat cheeses, including ricotta and mozzarella. Skim or 1% milk that is liquid, powdered, or evaporated. Buttermilk that is made with low-fat milk. Nonfat or low-fat yogurt. Fats and oils Non-hydrogenated (trans-free) margarines. Vegetable oils, including soybean, sesame, sunflower, olive, peanut, safflower, corn, canola, and cottonseed. Salad dressings or mayonnaise made with a vegetable oil. Beverages Mineral water. Coffee and tea. Diet carbonated beverages. Sweets and desserts Sherbet, gelatin, and fruit ice. Small amounts of dark chocolate. Limit all sweets and desserts. Seasonings and condiments All seasonings and condiments. The items listed above may not be a complete list of foods and drinks you can eat. Contact a dietitian for more options. What foods should I avoid? Fruits Canned fruit in heavy syrup. Fruit in cream or butter sauce. Fried fruit. Limit coconut. Vegetables Vegetables cooked in cheese, cream, or butter sauce. Fried vegetables. Grains Breads that are made with saturated or trans fats, oils, or whole milk. Croissants. Sweet rolls. Donuts. High-fat crackers, such as cheese crackers. Meats and other proteins Fatty meats,  such as hot dogs, ribs, sausage, bacon, rib-eye roast or steak. High-fat deli meats, such as salami and bologna. Caviar. Domestic duck and goose. Organ meats, such as liver. Dairy Cream, sour cream, cream cheese, and creamed cottage cheese. Whole-milk cheeses. Whole or 2% milk that is liquid, evaporated, or condensed. Whole buttermilk. Cream sauce or high-fat cheese sauce. Yogurt that is made from whole milk. Fats and oils Meat fat, or shortening. Cocoa butter, hydrogenated oils, palm oil, coconut oil, palm kernel oil. Solid fats and shortenings, including bacon fat, salt pork, lard, and butter. Nondairy cream substitutes. Salad dressings with cheese or sour cream. Beverages Regular sodas and juice drinks with added sugar. Sweets and desserts Frosting. Pudding. Cookies. Cakes. Pies. Milk chocolate or white chocolate. Buttered syrups. Full-fat ice cream or ice cream drinks. The items listed above may not be a complete list of foods and drinks to avoid. Contact a dietitian for more information. Summary  Heart-healthy meal planning includes eating less unhealthy fats, eating more healthy fats, and making other changes in your diet.  Eat a balanced diet. This includes fruits and vegetables, low-fat or nonfat dairy, lean protein, nuts and legumes, whole grains, and heart-healthy  oils and fats. This information is not intended to replace advice given to you by your health care provider. Make sure you discuss any questions you have with your health care provider. Document Revised: 09/07/2017 Document Reviewed: 08/11/2017 Elsevier Patient Education  2020 [employer].

## 2020-02-17 ENCOUNTER — Other Ambulatory Visit: Payer: Self-pay

## 2020-02-17 ENCOUNTER — Telehealth: Payer: Self-pay | Admitting: Internal Medicine

## 2020-02-17 DIAGNOSIS — I2510 Atherosclerotic heart disease of native coronary artery without angina pectoris: Secondary | ICD-10-CM

## 2020-02-17 MED ORDER — VALACYCLOVIR HCL 1 G PO TABS: 2000.0000 mg | ORAL_TABLET | Freq: Two times a day (BID) | ORAL | 2 refills | Status: DC | PRN

## 2020-02-17 MED ORDER — ROSUVASTATIN CALCIUM 10 MG PO TABS: 10.0000 mg | ORAL_TABLET | Freq: Every day | ORAL | 3 refills | Status: DC

## 2020-02-17 NOTE — Telephone Encounter (Signed)
Please call in Rx for Crestor 10 mg    F?U lipids and liver panel in 8 wks

## 2020-02-17 NOTE — Addendum Note (Signed)
Addended by: Rodman Key on: 02/17/2020 04:45 PM   Modules accepted: Orders

## 2020-02-18 ENCOUNTER — Telehealth: Payer: Self-pay

## 2020-02-18 DIAGNOSIS — E785 Hyperlipidemia, unspecified: Secondary | ICD-10-CM

## 2020-02-18 NOTE — Telephone Encounter (Signed)
[facility]. Lab orders entered.  Message to A. Desantiago at PCP office to make aware.

## 2020-02-18 NOTE — Chronic Care Management (AMB) (Signed)
  Chronic Care Management   Outreach Note  02/18/2020 Name: Donna Manning MRN: [id number] DOB: 09/15/1950  Referred by: Burnis Medin, MD Reason for referral : Chronic Care Management   An unsuccessful telephone outreach was attempted today. Left HIPAA compliant message on voicemail. Patient to return call to: [phone].   Follow Up Plan:  Will attempt to call patient again within the week.   Anson Crofts, PharmD, BCACP Clinical Pharmacist [facility] [place] [phone]

## 2020-02-18 NOTE — Chronic Care Management (AMB) (Signed)
°  Chronic Care Management   Outreach Note  02/18/2020 Name: Donna Manning MRN: [id number] DOB: [birth year]  Referred by: Burnis Medin, MD Reason for referral : Chronic Care Management   Patient returned call. Patient was advised of message from Dr. 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 Dr. Harrington Challenger prescribed rosuvastatin 10mg , 1 tablet once daily and was [facility] [address]. Patient was instructed she will need blood work for lipid and hepatic panel in 8 weeks after starting rosuvastatin (around October) and can schedule at [facility] [place] or cardiology office whichever she prefers. Patient understood and will call to schedule lab visit.      Follow Up Plan:  CCM follow up on 08/18/2020 (per patient request).     Anson Crofts, PharmD, BCACP Clinical Pharmacist [facility] [place] [phone]

## 2020-02-19 ENCOUNTER — Other Ambulatory Visit: Payer: Self-pay

## 2020-02-19 DIAGNOSIS — I2510 Atherosclerotic heart disease of native coronary artery without angina pectoris: Secondary | ICD-10-CM

## 2020-02-19 MED ORDER — ROSUVASTATIN CALCIUM 10 MG PO TABS: 10.0000 mg | ORAL_TABLET | Freq: Every day | ORAL | 3 refills | Status: DC

## 2020-02-20 ENCOUNTER — Other Ambulatory Visit: Payer: Self-pay

## 2020-02-20 ENCOUNTER — Ambulatory Visit (AMBULATORY_SURGERY_CENTER): Payer: Self-pay

## 2020-02-20 VITALS — Ht 67.0 in | Wt 181.0 lb

## 2020-02-20 DIAGNOSIS — Z1211 Encounter for screening for malignant neoplasm of colon: Secondary | ICD-10-CM

## 2020-02-20 DIAGNOSIS — K2270 Barrett's esophagus without dysplasia: Secondary | ICD-10-CM

## 2020-02-20 MED ORDER — SUTAB 1479-225-188 MG PO TABS: 12.0000 | ORAL_TABLET | ORAL | 0 refills | Status: DC

## 2020-02-20 NOTE — Progress Notes (Signed)
No allergies to soy or egg Pt is not on blood thinners or diet pills Denies issues with sedation/intubation Denies atrial flutter/fib Denies constipation   Emmi instructions given to pt  Pt is aware of Covid safety and care partner requirements.  

## 2020-03-11 DIAGNOSIS — H52203 Unspecified astigmatism, bilateral: Secondary | ICD-10-CM | POA: Diagnosis not present

## 2020-03-11 DIAGNOSIS — H5212 Myopia, left eye: Secondary | ICD-10-CM | POA: Diagnosis not present

## 2020-03-11 DIAGNOSIS — H2513 Age-related nuclear cataract, bilateral: Secondary | ICD-10-CM | POA: Diagnosis not present

## 2020-03-13 ENCOUNTER — Ambulatory Visit (AMBULATORY_SURGERY_CENTER): Payer: Medicare Other | Admitting: Gastroenterology

## 2020-03-13 ENCOUNTER — Encounter: Payer: Self-pay | Admitting: Gastroenterology

## 2020-03-13 ENCOUNTER — Other Ambulatory Visit: Payer: Self-pay

## 2020-03-13 VITALS — BP 126/69 | HR 65 | Temp 97.1°F | Resp 18 | Ht 67.0 in | Wt 181.0 lb

## 2020-03-13 DIAGNOSIS — Z8719 Personal history of other diseases of the digestive system: Secondary | ICD-10-CM

## 2020-03-13 DIAGNOSIS — Z1211 Encounter for screening for malignant neoplasm of colon: Secondary | ICD-10-CM

## 2020-03-13 DIAGNOSIS — K449 Diaphragmatic hernia without obstruction or gangrene: Secondary | ICD-10-CM | POA: Diagnosis not present

## 2020-03-13 DIAGNOSIS — D122 Benign neoplasm of ascending colon: Secondary | ICD-10-CM | POA: Diagnosis not present

## 2020-03-13 DIAGNOSIS — K2100 Gastro-esophageal reflux disease with esophagitis, without bleeding: Secondary | ICD-10-CM | POA: Diagnosis not present

## 2020-03-13 DIAGNOSIS — K635 Polyp of colon: Secondary | ICD-10-CM | POA: Diagnosis not present

## 2020-03-13 DIAGNOSIS — K2210 Ulcer of esophagus without bleeding: Secondary | ICD-10-CM | POA: Diagnosis not present

## 2020-03-13 DIAGNOSIS — K2950 Unspecified chronic gastritis without bleeding: Secondary | ICD-10-CM | POA: Diagnosis not present

## 2020-03-13 MED ORDER — OMEPRAZOLE 40 MG PO CPDR: DELAYED_RELEASE_CAPSULE | ORAL | 2 refills | Status: DC

## 2020-03-13 MED ORDER — SUCRALFATE 1 G PO TABS: ORAL_TABLET | ORAL | 0 refills | Status: DC

## 2020-03-13 MED ORDER — SODIUM CHLORIDE 0.9 % IV SOLN: 500.0000 mL | Freq: Once | INTRAVENOUS | Status: DC

## 2020-03-13 NOTE — Op Note (Signed)
[place] Patient Name: Steele Ledonne Procedure Date: 03/13/2020 11:19 AM MRN: [id number] Endoscopist: Mauri Pole , MD Age: 69 Referring MD:  Date of Birth: 1951/04/20 Gender: Female Account #: [id number] Procedure:                Colonoscopy Indications:              Screening for colorectal malignant neoplasm Medicines:                Monitored Anesthesia Care Procedure:                Pre-Anesthesia Assessment:                           - Prior to the procedure, a History and Physical                            was performed, and patient medications and                            allergies were reviewed. The patient's tolerance of                            previous anesthesia was also reviewed. The risks                            and benefits of the procedure and the sedation                            options and risks were discussed with the patient.                            All questions were answered, and informed consent                            was obtained. Prior Anticoagulants: The patient has                            taken no previous anticoagulant or antiplatelet                            agents. ASA Grade Assessment: III - A patient with                            severe systemic disease. After reviewing the risks                            and benefits, the patient was deemed in                            satisfactory condition to undergo the procedure.                           After obtaining informed consent, the colonoscope  was passed under direct vision. Throughout the                            procedure, the patient's blood pressure, pulse, and                            oxygen saturations were monitored continuously. The                            Colonoscope was introduced through the anus and                            advanced to the the cecum, identified by                            appendiceal  orifice and ileocecal valve. The                            colonoscopy was performed without difficulty. The                            patient tolerated the procedure well. The quality                            of the bowel preparation was adequate. The                            ileocecal valve, appendiceal orifice, and rectum                            were photographed. Scope In: 11:40:52 AM Scope Out: 11:55:07 AM Scope Withdrawal Time: 0 hours 8 minutes 17 seconds  Total Procedure Duration: 0 hours 14 minutes 15 seconds  Findings:                 The perianal and digital rectal examinations were                            normal.                           A 10 mm polyp was found in the ascending colon. The                            polyp was sessile. The polyp was removed with a                            cold snare. Resection and retrieval were complete.                           Scattered small and large-mouthed diverticula were                            found in the sigmoid colon, descending colon,  transverse colon and ascending colon.                           Non-bleeding external and internal hemorrhoids were                            found during retroflexion. The hemorrhoids were                            medium-sized. Complications:            No immediate complications. Estimated Blood Loss:     Estimated blood loss was minimal. Impression:               - One 10 mm polyp in the ascending colon, removed                            with a cold snare. Resected and retrieved.                           - Moderate diverticulosis in the sigmoid colon, in                            the descending colon, in the transverse colon and                            in the ascending colon.                           - Non-bleeding external and internal hemorrhoids. Recommendation:           - Patient has a contact number available for                             emergencies. The signs and symptoms of potential                            delayed complications were discussed with the                            patient. Return to normal activities tomorrow.                            Written discharge instructions were provided to the                            patient.                           - Resume previous diet.                           - Continue present medications.                           - Await pathology results.                           -  Repeat colonoscopy in 3 - 5 years for                            surveillance based on pathology results. Mauri Pole, MD 03/13/2020 11:59:33 AM This report has been signed electronically.

## 2020-03-13 NOTE — Patient Instructions (Signed)
Upper Endoscopy:            Await pathology results on biopsies done  NO ASPIRIN IBUPROFEN,NAPROXEN,OR OTHER NON STEROIDAL ANTIINFLAMMATORY MEDICATIONS  FOLLOW ANTI REFLUX REGIMEN - ORANGE HANDOUT GIVEN TO YOU TODAY  2 [facility] TO [facility] ULCERS AND GASTRITIS & FOR REFLUX  RETURN TO DR NANDIGAM'S OFFICE IN 3 MONTHS - MAKE THIS APPOINTMENT [phone]  HANDOUTS ON REFLUX AND GASTRITIS GIVEN TO YOU TODAY   COLONOSCOPY:   AWAIT PATHOLOGY RESULTS ON POLYP REMOVED   HANDOUT ON POLYPS, DIVERTICULOSIS ,& HEMORRHOIDS  GIVEN TO YOU TODAY     YOU HAD AN ENDOSCOPIC PROCEDURE TODAY AT [place]:   Refer to the procedure report that was given to you for any specific questions about what was found during the examination.  If the procedure report does not answer your questions, please call your gastroenterologist to clarify.  If you requested that your care partner not be given the details of your procedure findings, then the procedure report has been included in a sealed envelope for you to review at your convenience later.  YOU SHOULD EXPECT: Some feelings of bloating in the abdomen. Passage of more gas than usual.  Walking can help get rid of the air that was put into your GI tract during the procedure and reduce the bloating. If you had a lower endoscopy (such as a colonoscopy or flexible sigmoidoscopy) you may notice spotting of blood in your stool or on the toilet paper. If you underwent a bowel prep for your procedure, you may not have a normal bowel movement for a few days.  Please Note:  You might notice some irritation and congestion in your nose or some drainage.  This is from the oxygen used during your procedure.  There is no need for concern and it should clear up in a day or so.  SYMPTOMS TO REPORT IMMEDIATELY:   Following lower endoscopy (colonoscopy or flexible sigmoidoscopy):  Excessive amounts of blood in the stool  Significant  tenderness or worsening of abdominal pains  Swelling of the abdomen that is new, acute  Fever of 100F or higher   Following upper endoscopy (EGD)  Vomiting of blood or coffee ground material  New chest pain or pain under the shoulder blades  Painful or persistently difficult swallowing  New shortness of breath  Fever of 100F or higher  Black, tarry-looking stools  For urgent or emergent issues, a gastroenterologist can be reached at any hour by calling [phone]. Do not use MyChart messaging for urgent concerns.    DIET:  We do recommend a small meal at first, but then you may proceed to your regular diet.  Drink plenty of fluids but you should avoid alcoholic beverages for 24 hours.  ACTIVITY:  You should plan to take it easy for the rest of today and you should NOT DRIVE or use heavy machinery until tomorrow (because of the sedation medicines used during the test).    FOLLOW UP: Our staff will call the number listed on your records 48-72 hours following your procedure to check on you and address any questions or concerns that you may have regarding the information given to you following your procedure. If we do not reach you, we will leave a message.  We will attempt to reach you two times.  During this call, we will ask if you have developed any symptoms of COVID 19. If you develop any symptoms (  ie: fever, flu-like symptoms, shortness of breath, cough etc.) before then, please call [phone].  If you test positive for Covid 19 in the 2 weeks post procedure, please call and report this information to [place].    If any biopsies were taken you will be contacted by phone or by letter within the next 1-3 weeks.  Please call us at [phone] if you have not heard about the biopsies in 3 weeks.    SIGNATURES/CONFIDENTIALITY: You and/or your care partner have signed paperwork which will be entered into your electronic medical record.  These signatures attest to the fact that that  the information above on your After Visit Summary has been reviewed and is understood.  Full responsibility of the confidentiality of this discharge information lies with you and/or your care-partner.

## 2020-03-13 NOTE — Progress Notes (Signed)
Called to room to assist during endoscopic procedure.  Patient ID and intended procedure confirmed with present staff. Received instructions for my participation in the procedure from the performing physician.  

## 2020-03-13 NOTE — Progress Notes (Signed)
Pt's states no medical or surgical changes since previsit or office visit.  Vs -J Kooy RN  Front desk-JB

## 2020-03-13 NOTE — Progress Notes (Signed)
pt tolerated well. VSS. awake and to recovery. Report given to RN. Bite block inserted while awake no complaint no trauma. Removed without trauma.

## 2020-03-13 NOTE — Op Note (Signed)
[place] Patient Name: Donna Manning Procedure Date: 03/13/2020 11:19 AM MRN: [id number] Endoscopist: Mauri Pole , MD Age: 69 Referring MD:  Date of Birth: 1951/06/25 Gender: Female Account #: [id number] Procedure:                Upper GI endoscopy Indications:              Surveillance for malignancy due to personal history                            of Barrett's esophagus, Esophageal reflux symptoms                            that persist despite appropriate therapy Medicines:                Monitored Anesthesia Care Procedure:                Pre-Anesthesia Assessment:                           - Prior to the procedure, a History and Physical                            was performed, and patient medications and                            allergies were reviewed. The patient's tolerance of                            previous anesthesia was also reviewed. The risks                            and benefits of the procedure and the sedation                            options and risks were discussed with the patient.                            All questions were answered, and informed consent                            was obtained. Prior Anticoagulants: The patient has                            taken no previous anticoagulant or antiplatelet                            agents. ASA Grade Assessment: III - A patient with                            severe systemic disease. After reviewing the risks                            and benefits, the patient was deemed in  satisfactory condition to undergo the procedure.                           After obtaining informed consent, the endoscope was                            passed under direct vision. Throughout the                            procedure, the patient's blood pressure, pulse, and                            oxygen saturations were monitored continuously. The                             Endoscope was introduced through the mouth, and                            advanced to the second part of duodenum. The upper                            GI endoscopy was accomplished without difficulty.                            The patient tolerated the procedure well. Scope In: Scope Out: Findings:                 Few cratered and superficial esophageal ulcers                            oozing blood were found 33 to 35 cm from the                            incisors. The largest lesion was 4 mm in largest                            dimension. Biopsies were taken with a cold forceps                            for histology.                           A hiatal hernia was present.                           Patchy moderately friable mucosa with contact                            bleeding was found in the cardia and in the gastric                            body. Biopsies were taken with a cold forceps for  histology.                           The examined duodenum was normal. Complications:            No immediate complications. Estimated Blood Loss:     Estimated blood loss was minimal. Impression:               - Esophageal ulcers oozing blood. Biopsied.                           - Hiatal hernia.                           - Friable gastric mucosa. Biopsied.                           - Normal examined duodenum. Recommendation:           - Resume previous diet.                           - Continue present medications.                           - Await pathology results.                           - No aspirin, ibuprofen, naproxen, or other                            non-steroidal anti-inflammatory drugs.                           - Follow an antireflux regimen.                           - Use Prilosec (omeprazole) 40 mg PO BID for 3                            months.                           - Use sucralfate tablets 1 gram PO QID for 1 month.                            - Return to GI office in 3 months, please call                            [phone] to schedule appointment. Mauri Pole, MD 03/13/2020 12:04:36 PM This report has been signed electronically.

## 2020-03-17 ENCOUNTER — Other Ambulatory Visit: Payer: Self-pay | Admitting: Internal Medicine

## 2020-03-17 ENCOUNTER — Telehealth: Payer: Self-pay | Admitting: *Deleted

## 2020-03-17 NOTE — Telephone Encounter (Signed)
°  Follow up Call-  Call back number 03/13/2020  Post procedure Call Back phone  # [phone]  Permission to leave phone message Yes  Some recent data might be hidden     Patient questions:  Do you have a fever, pain , or abdominal swelling? No. Pain Score  0 *  Have you tolerated food without any problems? Yes.    Have you been able to return to your normal activities? Yes.    Do you have any questions about your discharge instructions: Diet   No. Medications  No. Follow up visit  No.  Do you have questions or concerns about your Care? No.  Actions: * If pain score is 4 or above: No action needed, pain <4.  1. Have you developed a fever since your procedure? no  2.   Have you had an respiratory symptoms (SOB or cough) since your procedure? no  3.   Have you tested positive for COVID 19 since your procedure no  4.   Have you had any family members/close contacts diagnosed with the COVID 19 since your procedure?  no   If yes to any of these questions please route to Joylene John, RN and Joella Prince, RN

## 2020-03-26 ENCOUNTER — Encounter: Payer: Self-pay | Admitting: Gastroenterology

## 2020-04-05 ENCOUNTER — Other Ambulatory Visit: Payer: Self-pay | Admitting: Gastroenterology

## 2020-04-07 ENCOUNTER — Other Ambulatory Visit: Payer: Self-pay | Admitting: Internal Medicine

## 2020-04-07 DIAGNOSIS — Z1231 Encounter for screening mammogram for malignant neoplasm of breast: Secondary | ICD-10-CM

## 2020-04-10 ENCOUNTER — Other Ambulatory Visit: Payer: Self-pay | Admitting: Hematology and Oncology

## 2020-04-15 ENCOUNTER — Ambulatory Visit: Payer: Medicare Other

## 2020-04-15 ENCOUNTER — Other Ambulatory Visit: Payer: Self-pay

## 2020-04-15 DIAGNOSIS — Z0000 Encounter for general adult medical examination without abnormal findings: Secondary | ICD-10-CM

## 2020-04-15 NOTE — Progress Notes (Signed)
Virtual Visit via Telephone Note  I connected with  Donna Manning on 04/15/20 at  1:00 PM EDT by telephone and verified that I am speaking with the correct person using two identifiers.  Medicare Annual Wellness visit completed telephonically due to Covid-19 pandemic.   Persons participating in this call: This Health Coach and this patient.   Location: Patient: Home Provider: Office   I discussed the limitations, risks, security and privacy concerns of performing an evaluation and management service by telephone and the availability of in person appointments. The patient expressed understanding and agreed to proceed.  Unable to perform video visit due to video visit attempted and failed and/or patient does not have video capability.   Some vital signs may be absent or patient reported.   Willette Brace, LPN    Subjective:   Donna Manning is a 69 y.o. female who presents for Medicare Annual (Subsequent) preventive examination.  Review of Systems     Cardiac Risk Factors include: advanced age (>29men, >27 women);dyslipidemia     Objective:    There were no vitals filed for this visit. There is no height or weight on file to calculate BMI.  Advanced Directives 04/15/2020 04/24/2018 04/17/2018 09/08/2016 09/08/2015 01/30/2015 12/25/2014  Does Patient Have a Medical Advance Directive? No No No No Yes No No  Type of Advance Directive - - - - [place] in Chart? - - - - No - copy requested - -  Would patient like information on creating a medical advance directive? No - Patient declined No - Patient declined No - Patient declined - - No - patient declined information -    Current Medications (verified) Outpatient Encounter Medications as of 04/15/2020  Medication Sig  . diphenhydrAMINE (BENADRYL) 50 MG tablet Take 1 pill (50 mg) 1 hour prior to your coronary CT  . levothyroxine (SYNTHROID) 88 MCG tablet TAKE 1  TABLET BY MOUTH EVERY DAY  . Multiple Vitamins-Minerals (MULTIVITAMIN ADULTS 50+ PO) Take 1 tablet by mouth daily.  Marland Kitchen omeprazole (PRILOSEC) 40 MG capsule Omeprazole 40 mg po BID for 3 months  . rosuvastatin (CRESTOR) 10 MG tablet Take 1 tablet (10 mg total) by mouth daily.  . tamoxifen (NOLVADEX) 20 MG tablet TAKE 1 TABLET BY MOUTH EVERY DAY  . [DISCONTINUED] famotidine (PEPCID) 20 MG tablet TAKE 1 TABLET BY MOUTH TWICE A DAY (Patient not taking: Reported on 04/15/2020)  . [DISCONTINUED] fluocinonide cream (LIDEX) 4.40 % Apply 1 application topically 2 (two) times daily. For contact dermatitis Not on face (Patient not taking: Reported on 02/13/2020)  . [DISCONTINUED] sucralfate (CARAFATE) 1 g tablet TAKE 1 TABLET BY MOUTH FOUR TIMES A DAY FOR 1 MONTH (Patient not taking: Reported on 04/15/2020)  . [DISCONTINUED] valACYclovir (VALTREX) 1000 MG tablet Take 2 tablets (2,000 mg total) by mouth 2 (two) times daily as needed. (Patient not taking: Reported on 04/15/2020)   No facility-administered encounter medications on file as of 04/15/2020.    Allergies (verified) Contrast media [iodinated diagnostic agents], Actonel [risedronate sodium], and Starch   History: Past Medical History:  Diagnosis Date  . 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 Barrett's esophagus 2009  . Benign head tremor   . Breast cancer, right breast ([place]) 01/2014   "had 5 weeks radiation after lumpectomy" (04/24/2018)  . Dental bridge present    lower left  . Dental crowns present   . GERD (gastroesophageal reflux  disease)   . History of DVT (deep vein thrombosis) ~ 1980   after a pregnancy  . History of radiation therapy 2015  . Hyperlipidemia   . Hypothyroidism   . Irregular heart beat    "dx'd by 1 dr; not noted by the cardiologist" (04/24/2018)  . NSAID-induced gastric ulcer   . Osteoporosis   . PONV (postoperative nausea and vomiting)    Past Surgical History:  Procedure Laterality Date  .  BREAST BIOPSY Right 1991  . BREAST EXCISIONAL BIOPSY Left 2019  . BREAST LUMPECTOMY Right 1991  . BREAST LUMPECTOMY W/ NEEDLE LOCALIZATION Left 04/24/2018  . BREAST LUMPECTOMY WITH NEEDLE LOCALIZATION Left 04/24/2018   Procedure: LEFT BREAST LUMPECTOMY WITH NEEDLE LOCALIZATION;  Surgeon: [name], MD;  Location: [facility];  Service: General;  Laterality: Left;  . COLONOSCOPY     "w/hemorrhoid banding"  . COLOSTOMY  2009  . PARTIAL MASTECTOMY WITH NEEDLE LOCALIZATION AND AXILLARY SENTINEL LYMPH NODE BX Right 01/20/2014   Procedure: RIGHT PARTIAL MASTECTOMY WITH DOUBLE  NEEDLE LOCALIZATION (BRACKETED )AND AXILLARY SENTINEL LYMPH NODE BX;  Surgeon: [name], MD;  Location: [place];  Service: General;  Laterality: Right;  And [place].  Marland Kitchen RE-EXCISION OF BREAST CANCER,SUPERIOR MARGINS Right 02/05/2014   Procedure: RIGHT LUMPECTOMY, RE-EXCISION OF BREAST CANCER,SUPERIOR MARGINS;  Surgeon: [name], MD;  Location: [place];  Service: General;  Laterality: Right;  . UPPER GASTROINTESTINAL ENDOSCOPY  2016  . UPPER GI ENDOSCOPY  12/25/2014   with Propofol   Family History  Problem Relation Age of Onset  . Heart failure Mother 61  . Diabetes Mother   . Parkinsonism Father 58  . Stroke Father   . Thyroid disease Sister   . Prostate cancer Brother   . Stomach cancer Paternal Grandfather   . Colon polyps Neg Hx   . Crohn's disease Neg Hx   . Esophageal cancer Neg Hx   . Rectal cancer Neg Hx    Social History   Socioeconomic History  . Marital status: Married    Spouse name: Not on file  . Number of children: 5  . Years of education: Not on file  . Highest education level: Not on file  Occupational History  . Occupation: homemaker  Tobacco Use  . Smoking status: Never Smoker  . Smokeless tobacco: Never Used  Vaping Use  . Vaping Use: Never used  Substance and Sexual Activity  . Alcohol use: Not Currently    Comment:  04/24/2018 "drank a few beers in college"  . Drug use: Never  . Sexual activity: Not Currently  Other Topics Concern  . Not on file  Social History Narrative  . Not on file   Social Determinants of Health   Financial Resource Strain: Low Risk   . Difficulty of Paying Living Expenses: Not hard at all  Food Insecurity: No Food Insecurity  . Worried About Charity fundraiser in the Last Year: Never true  . Ran Out of Food in the Last Year: Never true  Transportation Needs: No Transportation Needs  . Lack of Transportation (Medical): No  . Lack of Transportation (Non-Medical): No  Physical Activity: Sufficiently Active  . Days of Exercise per Week: 5 days  . Minutes of Exercise per Session: 30 min  Stress: No Stress Concern Present  . Feeling of Stress : Not at all  Social Connections: Moderately Integrated  . Frequency of Communication with Friends and Family: More than three  times a week  . Frequency of Social Gatherings with Friends and Family: More than three times a week  . Attends Religious Services: Never  . Active Member of Clubs or Organizations: Yes  . Attends Archivist Meetings: 1 to 4 times per year  . Marital Status: Married    Tobacco Counseling Counseling given: Not Answered   Clinical Intake:  Pre-visit preparation completed: Yes  Pain : No/denies pain     BMI - recorded: 28.35 Nutritional Status: BMI 25 -29 Overweight Nutritional Risks: None Diabetes: No  How often do you need to have someone help you when you read instructions, pamphlets, or other written materials from your doctor or pharmacy?: 1 - Never  Diabetic?No  Interpreter Needed?: No  Information entered by :: Charlott Rakes, LPN   Activities of Daily Living In your present state of health, do you have any difficulty performing the following activities: 04/15/2020  Hearing? N  Vision? N  Difficulty concentrating or making decisions? N  Walking or climbing stairs? N    Dressing or bathing? N  Doing errands, shopping? N  Preparing Food and eating ? N  Using the Toilet? N  In the past six months, have you accidently leaked urine? Y  Comment wears pads at times  Do you have problems with loss of bowel control? N  Managing your Medications? N  Managing your Finances? N  Housekeeping or managing your Housekeeping? N  Some recent data might be hidden    Patient Care Team: [name], [name], MD as PCP - General Fay Records, MD as PCP - Cardiology (Cardiology) [name], MD as Attending Physician (Obstetrics and Gynecology) Sharyne Peach, MD (Ophthalmology) Fanny Skates, MD as Consulting Physician (General Surgery) Thea Silversmith, MD as Consulting Physician (Radiation Oncology) Mauri Pole, MD as Consulting Physician (Gastroenterology) Nicholas Lose, MD as Consulting Physician (Hematology and Oncology) Earnie Larsson, [facility] as Pharmacist (Pharmacist)  Indicate any recent Medical Services you may have received from other than [facility] providers in the past year (date may be approximate).     Assessment:   This is a routine wellness examination for Donna Manning.  Hearing/Vision screen  Hearing Screening   125Hz  250Hz  500Hz  1000Hz  2000Hz  3000Hz  4000Hz  6000Hz  8000Hz   Right ear:           Left ear:           Comments: Pt denies any difficulty hearing at this time  Vision Screening Comments: Follows up with Dr Delman Cheadle for eye exams  Dietary issues and exercise activities discussed: Current Exercise Habits: Home exercise routine, Type of exercise: walking, Time (Minutes): 30, Frequency (Times/Week): 5, Weekly Exercise (Minutes/Week): 150  Goals    . Pharmacy Care Plan     CARE PLAN ENTRY (see longitudinal plan of care for additional care plan information)  Current Barriers:  . Chronic Disease Management support, education, and care coordination needs related to Hyperlipidemia, Barrett's esophagus/GERD, Hypothyroidism, Osteopenia, and  Hyperglycemia  Hyperlipidemia Lab Results  Component Value Date/Time   LDLCALC 115 (H) 06/17/2019 10:07 AM   LDLDIRECT 147.2 11/23/2011 09:12 AM   . Pharmacist Clinical Goal(s): o Over the next 180 days, patient will work with PharmD and providers to maintain LDL goal < 70 . Current regimen:  o No medications  . Interventions: o Recommend initiation of rosuvastatin (Crestor) 10mg , 1 tablet once daily . Patient self care activities - Over the next 180 days, patient will: o Follow up with Dr. Harrington Challenger on statin initiation.  Hyperglycemia Lab Results  Component Value Date/Time   HGBA1C 6.2 09/06/2019 11:47 AM   HGBA1C 6.1 06/17/2019 10:07 AM   . Pharmacist Clinical Goal(s): o Over the next  days, patient will work with PharmD and providers to maintain A1c goal <6.5% . Current regimen:  o No medications  . Interventions: o We will focus on managing/monitoring CVD risk factors, including keeping blood pressure and lipids under goal. Recommend modifying lifestyle, including to participate in moderate physical activity (e.g., walking) at least 150 minutes per week. Recommend a Mediterranean eating plan with an emphasis on whole grains, legumes, nuts, fruits, and vegetables and minimal refined and processed foods.  . Patient self care activities - Over the next 180 days, patient will: o Continue to modify diet and exercise  Hypothyroidism TSH  Date Value Ref Range Status  06/17/2019 1.13 0.35 - 4.50 uIU/mL Final .  Pharmacist Clinical Goal(s) o Over the next 180 days, patient will work with PharmD and providers to maintain TSH: 0.35 to 4.50 uIU/mL . Current regimen:  o Levothyroxine 38mcg, 1 tablet once daily . Interventions: o We discussed:  consistent administration times of levothyroxine (at least 30 minutes before first meal)  . Patient self care activities - Over the next 180 days, patient will: o Continue current medications as directed by provider.   Barrett's esophagus/  GERD . Pharmacist Clinical Goal(s) o Over the next 180 days, patient will work with PharmD and providers to reduce acid reflux.  . Current regimen:  o Famotidine 20mg , 1 tablet twice daily  . Patient self care activities o Patient will continue current medications as directed by provider. Managed by gastroenterologist Silverio Decamp, Venia Minks, MD)  Osteopenia . Pharmacist Clinical Goal(s) o Over the next 180 days, patient will work with PharmD and providers to prevent bone fractures. . Current regimen:  o No medications . Interventions: o Recommend [phone] units of vitamin D daily. Recommend 1200 mg of calcium daily from dietary and supplemental sources. Recommend weight-bearing and muscle strengthening exercises for building and maintaining bone density . Patient self care activities o Patient will continue current medications as directed by provider.   Medication management . Pharmacist Clinical Goal(s): o Over the next 180 days, patient will work with PharmD and providers to maintain optimal medication adherence . Current pharmacy: [facility] . Interventions o Comprehensive medication review performed. o Continue current medication management strategy . Patient self care activities - Over the next 180 days, patient will: o Take medications as prescribed o Report any questions or concerns to PharmD and/or provider(s)  Initial goal documentation       Depression Screen PHQ 2/9 Scores 04/15/2020 08/05/2019 06/04/2018 05/24/2017 11/28/2016 06/24/2015 04/02/2014  PHQ - 2 Score 0 0 0 0 0 0 0    Fall Risk Fall Risk  08/05/2019 06/07/2018 06/04/2018 05/24/2017 11/28/2016  Falls in the past year? 0 0 0 No No  Comment - - - - -  Number falls in past yr: 0 0 - - -  Injury with Fall? 0 0 - - -  Risk for fall due to : - History of fall(s) - - -  Follow up Falls evaluation completed Falls evaluation completed - - -     Any stairs in or around the home? Yes  If so, are there any without handrails?  No  Home free of loose throw rugs in walkways, pet beds, electrical cords, etc? Yes  Adequate lighting in your home to reduce risk of falls? Yes   ASSISTIVE  DEVICES UTILIZED TO PREVENT FALLS:  Life alert? No  Use of a cane, walker or w/c? No  Grab bars in the bathroom? Yes  Shower chair or bench in shower? Yes  Elevated toilet seat or a handicapped toilet? No   TIMED UP AND GO:  Was the test performed? No .   Cognitive Function:        Immunizations Immunization History  Administered Date(s) Administered  . Influenza Split 05/27/2012  . Influenza, High Dose Seasonal PF 07/17/2015, 06/27/2016, 05/24/2017  . Influenza-Unspecified 05/24/2018  . Moderna SARS-COVID-2 Vaccination 07/31/2019, 08/28/2019  . Pneumococcal Conjugate-13 05/24/2017  . Pneumococcal Polysaccharide-23 06/04/2018  . Tdap 11/30/2011  . Zoster Recombinat (Shingrix) 04/12/2017, 08/23/2017    TDAP status: Up to date Flu Vaccine status: Declined, Education has been provided regarding the importance of this vaccine but patient still declined. Advised may receive this vaccine at local pharmacy or Health Dept. Aware to provide a copy of the vaccination record if obtained from local pharmacy or Health Dept. Verbalized acceptance and understanding. Pneumococcal vaccine status: Up to date Covid-19 vaccine status: Completed vaccines  Qualifies for Shingles Vaccine? Yes   Zostavax completed Yes   Shingrix Completed?: Yes  Screening Tests Health Maintenance  Topic Date Due  . INFLUENZA VACCINE  02/16/2020  . MAMMOGRAM  04/23/2021  . TETANUS/TDAP  11/29/2021  . COLONOSCOPY  03/14/2023  . DEXA SCAN  Completed  . COVID-19 Vaccine  Completed  . Hepatitis C Screening  Completed  . PNA vac Low Risk Adult  Completed    Health Maintenance  Health Maintenance Due  Topic Date Due  . INFLUENZA VACCINE  02/16/2020    Colorectal cancer screening: Completed 03/13/20. Repeat every 3 years Mammogram status: Completed  04/24/19. Repeat every year Bone Density status: Completed 06/16/14. Results reflect: Bone density results: OSTEOPENIA. Repeat every 2 years.    Additional Screening:  Hepatitis C Screening: Completed 06/24/15  Vision Screening: Recommended annual ophthalmology exams for early detection of glaucoma and other disorders of the eye. Is the patient up to date with their annual eye exam?  Yes  Who is the provider or what is the name of the office in which the patient attends annual eye exams? Dr Delman Cheadle If pt is not established with a provider, would they like to be referred  Dental Screening: Recommended annual dental exams for proper oral hygiene  Community Resource Referral / Chronic Care Management: CRR required this visit?  No   CCM required this visit?  No      Plan:     I have personally reviewed and noted the following in the patient's chart:   . Medical and social history . Use of alcohol, tobacco or illicit drugs  . Current medications and supplements . Functional ability and status . Nutritional status . Physical activity . Advanced directives . List of other physicians . Hospitalizations, surgeries, and ER visits in previous 12 months . Vitals . Screenings to include cognitive, depression, and falls . Referrals and appointments  In addition, I have reviewed and discussed with patient certain preventive protocols, quality metrics, and best practice recommendations. A written personalized care plan for preventive services as well as general preventive health recommendations were provided to patient.     Willette Brace, LPN   [id number]   Nurse Notes: None

## 2020-04-15 NOTE — Patient Instructions (Signed)
Donna Manning , Thank you for taking time to come for your Medicare Wellness Visit. I appreciate your ongoing commitment to your health goals. Please review the following plan we discussed and let me know if I can assist you in the future.   Screening recommendations/referrals: Colonoscopy: Done 03/13/20 Mammogram: Done 04/24/19 Bone Density: Done 06/16/14 Recommended yearly ophthalmology/optometry visit for glaucoma screening and checkup Recommended yearly dental visit for hygiene and checkup  Vaccinations: Influenza vaccine: Done 05/01/19 Due and discussed  Pneumococcal vaccine: Up to date Tdap vaccine: Up to date Shingles vaccine: Completed 04/12/17 & 08/23/17   Covid-19:Completed 07/31/19 & 08/28/19  Advanced directives: Advance directive discussed with you today. Even though you declined this today please call our office should you change your mind and we can give you the proper paperwork for you to fill out.    Next appointment: Follow up in one year for your annual wellness visit    Preventive Care 70 Years and Older, Female Preventive care refers to lifestyle choices and visits with your health care provider that can promote health and wellness. What does preventive care include?  A yearly physical exam. This is also called an annual well check.  Dental exams once or twice a year.  Routine eye exams. Ask your health care provider how often you should have your eyes checked.  Personal lifestyle choices, including:  Daily care of your teeth and gums.  Regular physical activity.  Eating a healthy diet.  Avoiding tobacco and drug use.  Limiting alcohol use.  Practicing safe sex.  Taking low-dose aspirin every day.  Taking vitamin and mineral supplements as recommended by your health care provider. What happens during an annual well check? The services and screenings done by your health care provider during your annual well check will depend on your age, overall health,  lifestyle risk factors, and family history of disease. Counseling  Your health care provider may ask you questions about your:  Alcohol use.  Tobacco use.  Drug use.  Emotional well-being.  Home and relationship well-being.  Sexual activity.  Eating habits.  History of falls.  Memory and ability to understand (cognition).  Work and work Statistician.  Reproductive health. Screening  You may have the following tests or measurements:  Height, weight, and BMI.  Blood pressure.  Lipid and cholesterol levels. These may be checked every 5 years, or more frequently if you are over 41 years old.  Skin check.  Lung cancer screening. You may have this screening every year starting at age 70 if you have a 30-pack-year history of smoking and currently smoke or have quit within the past 15 years.  Fecal occult blood test (FOBT) of the stool. You may have this test every year starting at age 70.  Flexible sigmoidoscopy or colonoscopy. You may have a sigmoidoscopy every 5 years or a colonoscopy every 10 years starting at age 70.  Hepatitis C blood test.  Hepatitis B blood test.  Sexually transmitted disease (STD) testing.  Diabetes screening. This is done by checking your blood sugar (glucose) after you have not eaten for a while (fasting). You may have this done every 1-3 years.  Bone density scan. This is done to screen for osteoporosis. You may have this done starting at age 70.  Mammogram. This may be done every 1-2 years. Talk to your health care provider about how often you should have regular mammograms. Talk with your health care provider about your test results, treatment options, and if necessary,  the need for more tests. Vaccines  Your health care provider may recommend certain vaccines, such as:  Influenza vaccine. This is recommended every year.  Tetanus, diphtheria, and acellular pertussis (Tdap, Td) vaccine. You may need a Td booster every 10 years.  Zoster  vaccine. You may need this after age 70.  Pneumococcal 13-valent conjugate (PCV13) vaccine. One dose is recommended after age 70.  Pneumococcal polysaccharide (PPSV23) vaccine. One dose is recommended after age 70. Talk to your health care provider about which screenings and vaccines you need and how often you need them. This information is not intended to replace advice given to you by your health care provider. Make sure you discuss any questions you have with your health care provider. Document Released: 07/31/2015 Document Revised: 03/23/2016 Document Reviewed: 05/05/2015 Elsevier Interactive Patient Education  2017 [employer] Prevention in the Home Falls can cause injuries. They can happen to people of all ages. There are many things you can do to make your home safe and to help prevent falls. What can I do on the outside of my home?  Regularly fix the edges of walkways and driveways and fix any cracks.  Remove anything that might make you trip as you walk through a door, such as a raised step or threshold.  Trim any bushes or trees on the path to your home.  Use bright outdoor lighting.  Clear any walking paths of anything that might make someone trip, such as rocks or tools.  Regularly check to see if handrails are loose or broken. Make sure that both sides of any steps have handrails.  Any raised decks and porches should have guardrails on the edges.  Have any leaves, snow, or ice cleared regularly.  Use sand or salt on walking paths during winter.  Clean up any spills in your garage right away. This includes oil or grease spills. What can I do in the bathroom?  Use night lights.  Install grab bars by the toilet and in the tub and shower. Do not use towel bars as grab bars.  Use non-skid mats or decals in the tub or shower.  If you need to sit down in the shower, use a plastic, non-slip stool.  Keep the floor dry. Clean up any water that spills on the  floor as soon as it happens.  Remove soap buildup in the tub or shower regularly.  Attach bath mats securely with double-sided non-slip rug tape.  Do not have throw rugs and other things on the floor that can make you trip. What can I do in the bedroom?  Use night lights.  Make sure that you have a light by your bed that is easy to reach.  Do not use any sheets or blankets that are too big for your bed. They should not hang down onto the floor.  Have a firm chair that has side arms. You can use this for support while you get dressed.  Do not have throw rugs and other things on the floor that can make you trip. What can I do in the kitchen?  Clean up any spills right away.  Avoid walking on wet floors.  Keep items that you use a lot in easy-to-reach places.  If you need to reach something above you, use a strong step stool that has a grab bar.  Keep electrical cords out of the way.  Do not use floor polish or wax that makes floors slippery. If you must use  wax, use non-skid floor wax.  Do not have throw rugs and other things on the floor that can make you trip. What can I do with my stairs?  Do not leave any items on the stairs.  Make sure that there are handrails on both sides of the stairs and use them. Fix handrails that are broken or loose. Make sure that handrails are as long as the stairways.  Check any carpeting to make sure that it is firmly attached to the stairs. Fix any carpet that is loose or worn.  Avoid having throw rugs at the top or bottom of the stairs. If you do have throw rugs, attach them to the floor with carpet tape.  Make sure that you have a light switch at the top of the stairs and the bottom of the stairs. If you do not have them, ask someone to add them for you. What else can I do to help prevent falls?  Wear shoes that:  Do not have high heels.  Have rubber bottoms.  Are comfortable and fit you well.  Are closed at the toe. Do not wear  sandals.  If you use a stepladder:  Make sure that it is fully opened. Do not climb a closed stepladder.  Make sure that both sides of the stepladder are locked into place.  Ask someone to hold it for you, if possible.  Clearly mark and make sure that you can see:  Any grab bars or handrails.  First and last steps.  Where the edge of each step is.  Use tools that help you move around (mobility aids) if they are needed. These include:  Canes.  Walkers.  Scooters.  Crutches.  Turn on the lights when you go into a dark area. Replace any light bulbs as soon as they burn out.  Set up your furniture so you have a clear path. Avoid moving your furniture around.  If any of your floors are uneven, fix them.  If there are any pets around you, be aware of where they are.  Review your medicines with your doctor. Some medicines can make you feel dizzy. This can increase your chance of falling. Ask your doctor what other things that you can do to help prevent falls. This information is not intended to replace advice given to you by your health care provider. Make sure you discuss any questions you have with your health care provider. Document Released: 04/30/2009 Document Revised: 12/10/2015 Document Reviewed: 08/08/2014 Elsevier Interactive Patient Education  2017 [employer].

## 2020-04-24 ENCOUNTER — Other Ambulatory Visit: Payer: Self-pay

## 2020-04-24 ENCOUNTER — Ambulatory Visit
Admission: RE | Admit: 2020-04-24 | Discharge: 2020-04-24 | Disposition: A | Discharge status: Home / Self Care | Payer: Medicare Other | Source: Ambulatory Visit | Attending: Internal Medicine | Admitting: Internal Medicine

## 2020-04-24 DIAGNOSIS — Z1231 Encounter for screening mammogram for malignant neoplasm of breast: Secondary | ICD-10-CM | POA: Diagnosis not present

## 2020-05-11 NOTE — Progress Notes (Signed)
Patient Care Team: [name], [name], MD as PCP - Desma Paganini, MD as PCP - Cardiology (Cardiology) [name], MD as Attending Physician (Obstetrics and Gynecology) Sharyne Peach, MD (Ophthalmology) Fanny Skates, MD as Consulting Physician (General Surgery) Thea Silversmith, MD as Consulting Physician (Radiation Oncology) Mauri Pole, MD as Consulting Physician (Gastroenterology) Nicholas Lose, MD as Consulting Physician (Hematology and Oncology) Earnie Larsson, [facility] as Pharmacist (Pharmacist)  DIAGNOSIS:    ICD-10-CM   1. Malignant neoplasm of upper-outer quadrant of right breast in female, estrogen receptor positive ([place])  C50.411    Z17.0     SUMMARY OF ONCOLOGIC HISTORY: Oncology History  Breast cancer of upper-outer quadrant of right female breast ([place])  12/24/2013 Initial Diagnosis   Breast cancer of upper-outer quadrant of right female breast: Initial biopsy showed DCIS with calcifications and atypical ductal hyperplasia with suspicion of stromal invasion ER 100% PR 100%   01/20/2014 Surgery   Right breast lumpectomy, IDC grade 1; 3.9 cm and 0.25 cm with DCIS int grade with ALH 3 SLN negative superior margin positive, ER 100% PR 100% HER-2 negative ratio 1.1 Ki-67 3% Oncotype 14 low risk    02/05/2014 Surgery   Reexcision of the superior margin no residual cancer   04/17/2014 - 05/16/2014 Radiation Therapy   Adjuvant radiation therapy   07/25/2014 -  Anti-estrogen oral therapy   Anastrozole 1 mg daily switched to tamoxifen 20 mg daily from 09/23/2014 due to osteoporosis   04/24/2018 Surgery   Mammogram on 03/27/18 showed a distortion in the left breast. Biopsy on 03/29/19 showed atypical lobular hyperplasia. Lumpectomy on 04/24/18 with Dr. Brantley Stage.     CHIEF COMPLIANT: Follow-up of right breast cancer on tamoxifen  INTERVAL HISTORY: Donna Manning is a 69 y.o. with above-mentioned history of right breast cancer treated with lumpectomy,  radiation, and who is currently on anti-estrogen therapy with tamoxifen. Mammogram on 04/29/20 showed no evidence of malignancy bilaterally. She presents to the clinic today for annual follow-up.   ALLERGIES:  is allergic to contrast media [iodinated diagnostic agents], actonel [risedronate sodium], and starch.  MEDICATIONS:  Current Outpatient Medications  Medication Sig Dispense Refill  . diphenhydrAMINE (BENADRYL) 50 MG tablet Take 1 pill (50 mg) 1 hour prior to your coronary CT 1 tablet 0  . levothyroxine (SYNTHROID) 88 MCG tablet TAKE 1 TABLET BY MOUTH EVERY DAY 90 tablet 0  . Multiple Vitamins-Minerals (MULTIVITAMIN ADULTS 50+ PO) Take 1 tablet by mouth daily.    Marland Kitchen omeprazole (PRILOSEC) 40 MG capsule Omeprazole 40 mg po BID for 3 months 90 capsule 2  . rosuvastatin (CRESTOR) 10 MG tablet Take 1 tablet (10 mg total) by mouth daily. 90 tablet 3  . tamoxifen (NOLVADEX) 20 MG tablet TAKE 1 TABLET BY MOUTH EVERY DAY 90 tablet 3   No current facility-administered medications for this visit.    PHYSICAL EXAMINATION: ECOG PERFORMANCE STATUS: 1 - Symptomatic but completely ambulatory  Vitals:   05/12/20 0900  BP: 130/70  Pulse: 73  Resp: 18  Temp: (!) 96.2 F (35.7 C)  SpO2: 100%   Filed Weights   05/12/20 0900  Weight: 180 lb (81.6 kg)    BREAST: No palpable masses or nodules in either right or left breasts. No palpable axillary supraclavicular or infraclavicular adenopathy no breast tenderness or nipple discharge. (exam performed in the presence of a chaperone)  LABORATORY DATA:  I have reviewed the data as listed CMP Latest Ref Rng & Units 09/06/2019 06/17/2019 06/04/2018  Glucose  70 - 99 mg/dL 106(H) 120(H) 103(H)  BUN 6 - 23 mg/dL '12 11 12  ' Creatinine 0.40 - 1.20 mg/dL 0.89 0.80 0.83  Sodium 135 - 145 mEq/L 140 139 141  Potassium 3.5 - 5.1 mEq/L 4.0 3.9 4.1  Chloride 96 - 112 mEq/L 105 105 105  CO2 19 - 32 mEq/L '28 27 27  ' Calcium 8.4 - 10.5 mg/dL 9.1 8.9 9.3  Total  Protein 6.0 - 8.3 g/dL - 6.6 6.8  Total Bilirubin 0.2 - 1.2 mg/dL - 0.6 0.4  Alkaline Phos 39 - 117 U/L - 31(L) 35(L)  AST 0 - 37 U/L - 23 16  ALT 0 - 35 U/L - 19 12    Lab Results  Component Value Date   WBC 5.6 09/06/2019   HGB 12.4 09/06/2019   HCT 37.6 09/06/2019   MCV 91.9 09/06/2019   PLT 259.0 09/06/2019   NEUTROABS 3.0 09/06/2019    ASSESSMENT & PLAN:  Breast cancer of upper-outer quadrant of right female breast Right breast invasive ductal carcinoma T2, N0, M0 stage IIA 0.9 cm and a separate nodule 0.25 cm ER/PR positive HER-2 negative, Oncotype DX recurrence score is 14 (9% risk of distant recurrence over 10 years with tamoxifen alone) status post radiation therapy, started antiestrogen therapy with anastrozole in January 2016 years switched to tamoxifen 09/23/2014 (due to osteoporosis), plan to treat for 10 years 03/28/2018: Atypical lobular hyperplasia left breast biopsy, left lumpectomy: Fibrocystic change  Tamoxifen toxicities:Denies any hot flashes or myalgias, complains of hair thinning. Endometrial thickening:She no longer has a white discharge.  Osteoporosis: DEXA scan November 2015 showed a T score of -2.6(because of which we switched treatment to tamoxifen):Prolia was offered but patient refused. I ordered another bone density test. 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 Breast exam10/26/2021: Normal 2. Mammogram10/13/2021no abnormalities. Postsurgical changes. Breast Density CategoryC.  I sent a prescription for 1 year refills of tamoxifen Return to clinic in 1 year for follow-up    No orders of the defined types were placed in this encounter.  The patient has a good understanding of the overall plan. she agrees with it. she will call with any problems that may develop before the next visit here.  Total time spent: 20 mins including face to face time  and time spent for planning, charting and coordination of care  Nicholas Lose, MD 05/12/2020  I, Cloyde Reams Dorshimer, am acting as scribe for Dr. Nicholas Lose.  I have reviewed the above documentation for accuracy and completeness, and I agree with the above.

## 2020-05-12 ENCOUNTER — Other Ambulatory Visit: Payer: Self-pay

## 2020-05-12 ENCOUNTER — Inpatient Hospital Stay: Payer: Medicare Other | Attending: Hematology and Oncology | Admitting: Hematology and Oncology

## 2020-05-12 VITALS — BP 130/70 | HR 73 | Temp 96.2°F | Resp 18 | Ht 67.0 in | Wt 180.0 lb

## 2020-05-12 DIAGNOSIS — Z780 Asymptomatic menopausal state: Secondary | ICD-10-CM | POA: Diagnosis not present

## 2020-05-12 DIAGNOSIS — Z79899 Other long term (current) drug therapy: Secondary | ICD-10-CM | POA: Diagnosis not present

## 2020-05-12 DIAGNOSIS — Z170 Estrogen receptor positive status [ER+]: Secondary | ICD-10-CM | POA: Diagnosis not present

## 2020-05-12 DIAGNOSIS — M818 Other osteoporosis without current pathological fracture: Secondary | ICD-10-CM | POA: Insufficient documentation

## 2020-05-12 DIAGNOSIS — Z923 Personal history of irradiation: Secondary | ICD-10-CM | POA: Diagnosis not present

## 2020-05-12 DIAGNOSIS — C50411 Malignant neoplasm of upper-outer quadrant of right female breast: Secondary | ICD-10-CM | POA: Insufficient documentation

## 2020-05-12 DIAGNOSIS — I2510 Atherosclerotic heart disease of native coronary artery without angina pectoris: Secondary | ICD-10-CM

## 2020-05-12 DIAGNOSIS — Z79810 Long term (current) use of selective estrogen receptor modulators (SERMs): Secondary | ICD-10-CM | POA: Insufficient documentation

## 2020-05-12 MED ORDER — TAMOXIFEN CITRATE 20 MG PO TABS: 20.0000 mg | ORAL_TABLET | Freq: Every day | ORAL | 3 refills | Status: DC

## 2020-05-12 NOTE — Assessment & Plan Note (Signed)
Right breast invasive ductal carcinoma T2, N0, M0 stage IIA 0.9 cm and a separate nodule 0.25 cm ER/PR positive HER-2 negative, Oncotype DX recurrence score is 14 (9% risk of distant recurrence over 10 years with tamoxifen alone) status post radiation therapy, started antiestrogen therapy with anastrozole in January 2016 years switched to tamoxifen 09/23/2014 (due to osteoporosis), plan to treat for 10 years 03/28/2018: Atypical lobular hyperplasia left breast biopsy, left lumpectomy: Fibrocystic change  Tamoxifen toxicities:Denies any hot flashes or myalgias Endometrial thickening:She no longer has a white discharge.  Osteoporosis: DEXA scan November 2015 showed a T score of -2.6(because of which we switched treatment to tamoxifen):Prolia was offered but patient refused. 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 Breast exam10/26/2021: Normal 2. Mammogram10/13/2021no abnormalities. Postsurgical changes. Breast Density CategoryC.  I sent a prescription for 1 year refills of tamoxifen Return to clinic in 1 year for follow-up

## 2020-05-18 DIAGNOSIS — Z23 Encounter for immunization: Secondary | ICD-10-CM | POA: Diagnosis not present

## 2020-05-18 LAB — NOVEL CORONAVIRUS, NAA

## 2020-06-02 ENCOUNTER — Encounter: Payer: Self-pay | Admitting: Internal Medicine

## 2020-06-02 DIAGNOSIS — Z23 Encounter for immunization: Secondary | ICD-10-CM | POA: Diagnosis not present

## 2020-06-04 DIAGNOSIS — Z124 Encounter for screening for malignant neoplasm of cervix: Secondary | ICD-10-CM | POA: Diagnosis not present

## 2020-06-04 DIAGNOSIS — Z01419 Encounter for gynecological examination (general) (routine) without abnormal findings: Secondary | ICD-10-CM | POA: Diagnosis not present

## 2020-06-15 ENCOUNTER — Ambulatory Visit (AMBULATORY_SURGERY_CENTER): Payer: Self-pay

## 2020-06-15 ENCOUNTER — Other Ambulatory Visit: Payer: Self-pay

## 2020-06-15 VITALS — Ht 67.0 in | Wt 180.0 lb

## 2020-06-15 DIAGNOSIS — K2270 Barrett's esophagus without dysplasia: Secondary | ICD-10-CM

## 2020-06-15 DIAGNOSIS — Z8719 Personal history of other diseases of the digestive system: Secondary | ICD-10-CM

## 2020-06-15 NOTE — Progress Notes (Signed)
No egg or soy allergy known to patient  No issues with past sedation with any surgeries or procedures No intubation problems in the past  No FH of Malignant Hyperthermia No diet pills per patient No home 02 use per patient  No blood thinners per patient  Pt denies issues with constipation  No A fib or A flutter  EMMI video via MyChart  COVID 19 guidelines implemented in PV today with Pt and RN  Coupon given to pt in PV today , Code to Pharmacy  COVID vaccines completed on 08/2019 per pt;  Due to the COVID-19 pandemic we are asking patients to follow these guidelines. Please only bring one care partner. Please be aware that your care partner may wait in the car in the parking lot or if they feel like they will be too hot to wait in the car, they may wait in the lobby on the 4th floor. All care partners are required to wear a mask the entire time (we do not have any that we can provide them), they need to practice social distancing, and we will do a Covid check for all patient's and care partners when you arrive. Also we will check their temperature and your temperature. If the care partner waits in their car they need to stay in the parking lot the entire time and we will call them on their cell phone when the patient is ready for discharge so they can bring the car to the front of the building. Also all patient's will need to wear a mask into building.  

## 2020-06-17 ENCOUNTER — Other Ambulatory Visit: Payer: Self-pay | Admitting: Internal Medicine

## 2020-06-18 NOTE — Telephone Encounter (Signed)
Is this okay to fill?    Last thyroid labs were 06/17/2019

## 2020-06-19 NOTE — Telephone Encounter (Signed)
Needs labs and yearly visit    Over due for thyroid check  Please arrange lab and   Visit  And can refill 30 days   Please order tsh ,lipid panel, BMP and LFT  Fasting  And then visit

## 2020-06-22 ENCOUNTER — Other Ambulatory Visit: Payer: Self-pay

## 2020-06-22 DIAGNOSIS — E785 Hyperlipidemia, unspecified: Secondary | ICD-10-CM

## 2020-06-22 DIAGNOSIS — D649 Anemia, unspecified: Secondary | ICD-10-CM

## 2020-06-22 DIAGNOSIS — E039 Hypothyroidism, unspecified: Secondary | ICD-10-CM

## 2020-06-22 MED ORDER — LEVOTHYROXINE SODIUM 88 MCG PO TABS: 88.0000 ug | ORAL_TABLET | Freq: Every day | ORAL | 0 refills | Status: DC

## 2020-06-22 NOTE — Telephone Encounter (Signed)
Lab appointment scheduled for 07-07-2020.  Refill for 30days levothyroxine sent to pharmacy

## 2020-06-24 ENCOUNTER — Other Ambulatory Visit: Payer: Self-pay | Admitting: Gastroenterology

## 2020-06-24 ENCOUNTER — Encounter: Payer: Self-pay | Admitting: Gastroenterology

## 2020-06-24 ENCOUNTER — Other Ambulatory Visit: Payer: Self-pay

## 2020-06-24 ENCOUNTER — Ambulatory Visit (AMBULATORY_SURGERY_CENTER): Payer: Medicare Other | Admitting: Gastroenterology

## 2020-06-24 VITALS — BP 122/63 | HR 67 | Temp 98.2°F | Resp 21 | Ht 67.0 in | Wt 180.0 lb

## 2020-06-24 DIAGNOSIS — K2270 Barrett's esophagus without dysplasia: Secondary | ICD-10-CM | POA: Diagnosis not present

## 2020-06-24 DIAGNOSIS — Z8719 Personal history of other diseases of the digestive system: Secondary | ICD-10-CM

## 2020-06-24 DIAGNOSIS — K208 Other esophagitis without bleeding: Secondary | ICD-10-CM | POA: Diagnosis not present

## 2020-06-24 MED ORDER — OMEPRAZOLE 40 MG PO CPDR: DELAYED_RELEASE_CAPSULE | ORAL | 3 refills | Status: DC

## 2020-06-24 MED ORDER — SODIUM CHLORIDE 0.9 % IV SOLN: 500.0000 mL | Freq: Once | INTRAVENOUS | Status: DC

## 2020-06-24 NOTE — Progress Notes (Signed)
To PACU, VSS. Report to [email or web address] 

## 2020-06-24 NOTE — Progress Notes (Signed)
Called to room to assist during endoscopic procedure.  Patient ID and intended procedure confirmed with present staff. Received instructions for my participation in the procedure from the performing physician.  

## 2020-06-24 NOTE — Progress Notes (Signed)
VS-JD  Pt's states no medical or surgical changes since previsit or office visit.  

## 2020-06-24 NOTE — Patient Instructions (Addendum)
Please read handouts provided. Continue present medications. Await pathology results. Follow an anti-reflux regimen.  Prilosec ( omeprazole ) 40 mg twice daily. Return to GI office in 6 months.     YOU HAD AN ENDOSCOPIC PROCEDURE TODAY AT [facility]:   Refer to the procedure report that was given to you for any specific questions about what was found during the examination.  If the procedure report does not answer your questions, please call your gastroenterologist to clarify.  If you requested that your care partner not be given the details of your procedure findings, then the procedure report has been included in a sealed envelope for you to review at your convenience later.  YOU SHOULD EXPECT: Some feelings of bloating in the abdomen. Passage of more gas than usual.  Walking can help get rid of the air that was put into your GI tract during the procedure and reduce the bloating. If you had a lower endoscopy (such as a colonoscopy or flexible sigmoidoscopy) you may notice spotting of blood in your stool or on the toilet paper. If you underwent a bowel prep for your procedure, you may not have a normal bowel movement for a few days.  Please Note:  You might notice some irritation and congestion in your nose or some drainage.  This is from the oxygen used during your procedure.  There is no need for concern and it should clear up in a day or so.  SYMPTOMS TO REPORT IMMEDIATELY:     Following upper endoscopy (EGD)  Vomiting of blood or coffee ground material  New chest pain or pain under the shoulder blades  Painful or persistently difficult swallowing  New shortness of breath  Fever of 100F or higher  Black, tarry-looking stools  For urgent or emergent issues, a gastroenterologist can be reached at any hour by calling [phone]. Do not use MyChart messaging for urgent concerns.    DIET:  We do recommend a small meal at first, but then you may proceed to your  regular diet.  Drink plenty of fluids but you should avoid alcoholic beverages for 24 hours.  ACTIVITY:  You should plan to take it easy for the rest of today and you should NOT DRIVE or use heavy machinery until tomorrow (because of the sedation medicines used during the test).    FOLLOW UP: Our staff will call the number listed on your records 48-72 hours following your procedure to check on you and address any questions or concerns that you may have regarding the information given to you following your procedure. If we do not reach you, we will leave a message.  We will attempt to reach you two times.  During this call, we will ask if you have developed any symptoms of COVID 19. If you develop any symptoms (ie: fever, flu-like symptoms, shortness of breath, cough etc.) before then, please call [phone].  If you test positive for Covid 19 in the 2 weeks post procedure, please call and report this information to [place].    If any biopsies were taken you will be contacted by phone or by letter within the next 1-3 weeks.  Please call us at [phone] if you have not heard about the biopsies in 3 weeks.    SIGNATURES/CONFIDENTIALITY: You and/or your care partner have signed paperwork which will be entered into your electronic medical record.  These signatures attest to the fact that that the information above on your After Visit Summary  has been reviewed and is understood.  Full responsibility of the confidentiality of this discharge information lies with you and/or your care-partner. 

## 2020-06-24 NOTE — Op Note (Signed)
[place] Patient Name: Donna Manning Procedure Date: 06/24/2020 8:51 AM MRN: [id number] Endoscopist: Mauri Pole , MD Age: 69 Referring MD:  Date of Birth: 1950-08-07 Gender: Female Account #: [id number] Procedure:                Upper GI endoscopy Indications:              Follow-up of reflux esophagitis, Follow-up of                            esophageal ulcer Medicines:                Monitored Anesthesia Care Procedure:                Pre-Anesthesia Assessment:                           - Prior to the procedure, a History and Physical                            was performed, and patient medications and                            allergies were reviewed. The patient's tolerance of                            previous anesthesia was also reviewed. The risks                            and benefits of the procedure and the sedation                            options and risks were discussed with the patient.                            All questions were answered, and informed consent                            was obtained. Prior Anticoagulants: The patient has                            taken no previous anticoagulant or antiplatelet                            agents. ASA Grade Assessment: II - A patient with                            mild systemic disease. After reviewing the risks                            and benefits, the patient was deemed in                            satisfactory condition to undergo the procedure.  After obtaining informed consent, the endoscope was                            passed under direct vision. Throughout the                            procedure, the patient's blood pressure, pulse, and                            oxygen saturations were monitored continuously. The                            Endoscope was introduced through the mouth, and                            advanced to the second part of  duodenum. The upper                            GI endoscopy was accomplished without difficulty.                            The patient tolerated the procedure well. Scope In: Scope Out: Findings:                 The Z-line was regular and was found 35 cm from the                            incisors.                           LA Grade B (one or more mucosal breaks greater than                            5 mm, not extending between the tops of two mucosal                            folds) esophagitis with no bleeding was found 34 to                            35 cm from the incisors. Biopsies were taken with a                            cold forceps for histology.                           The gastroesophageal flap valve was visualized                            endoscopically and classified as Hill Grade III                            (minimal fold, loose to endoscope, hiatal hernia  likely).                           The stomach was normal.                           The examined duodenum was normal. Complications:            No immediate complications. Estimated Blood Loss:     Estimated blood loss was minimal. Impression:               - Z-line regular, 35 cm from the incisors.                           - LA Grade B reflux esophagitis with no bleeding.                            Biopsied.                           - Gastroesophageal flap valve classified as Hill                            Grade III (minimal fold, loose to endoscope, hiatal                            hernia likely).                           - Normal stomach.                           - Normal examined duodenum. Recommendation:           - Resume previous diet.                           - Continue present medications.                           - Await pathology results.                           - Follow an antireflux regimen.                           - Use Prilosec (omeprazole) 40 mg PO  BID.                           - Return to GI office in 6 months. Mauri Pole, MD 06/24/2020 9:19:04 AM This report has been signed electronically.

## 2020-06-26 ENCOUNTER — Telehealth: Payer: Self-pay

## 2020-06-26 ENCOUNTER — Telehealth: Payer: Self-pay | Admitting: *Deleted

## 2020-06-26 NOTE — Telephone Encounter (Signed)
  Follow up Call-  Call back number 06/24/2020 03/13/2020  Post procedure Call Back phone  # [phone]  Permission to leave phone message Yes Yes  Some recent data might be hidden     Patient questions:  Do you have a fever, pain , or abdominal swelling? No. Pain Score  0 *  Have you tolerated food without any problems? Yes.    Have you been able to return to your normal activities? Yes.    Do you have any questions about your discharge instructions: Diet   No. Medications  No. Follow up visit  No.  Do you have questions or concerns about your Care? No.  Actions: * If pain score is 4 or above: No action needed, pain <4. 1. Have you developed a fever since your procedure? no  2.   Have you had an respiratory symptoms (SOB or cough) since your procedure? no  3.   Have you tested positive for COVID 19 since your procedure no  4.   Have you had any family members/close contacts diagnosed with the COVID 19 since your procedure?  no   If yes to any of these questions please route to Joylene John, RN and Joella Prince, RN

## 2020-06-26 NOTE — Telephone Encounter (Signed)
  Follow up Call-  Call back number 06/24/2020 03/13/2020  Post procedure Call Back phone  # [phone]  Permission to leave phone message Yes Yes  Some recent data might be hidden     No answer at 2nd attempt follow up phone call. No message left

## 2020-07-07 ENCOUNTER — Other Ambulatory Visit: Payer: Medicare Other

## 2020-07-07 ENCOUNTER — Other Ambulatory Visit: Payer: Self-pay

## 2020-07-07 DIAGNOSIS — D649 Anemia, unspecified: Secondary | ICD-10-CM

## 2020-07-07 DIAGNOSIS — E785 Hyperlipidemia, unspecified: Secondary | ICD-10-CM

## 2020-07-07 DIAGNOSIS — E039 Hypothyroidism, unspecified: Secondary | ICD-10-CM | POA: Diagnosis not present

## 2020-07-08 LAB — HEPATIC FUNCTION PANEL
AG Ratio: 2 (calc) (ref 1.0–2.5)
ALT: 14 U/L (ref 6–29)
AST: 19 U/L (ref 10–35)
Albumin: 4.4 g/dL (ref 3.6–5.1)
Alkaline phosphatase (APISO): 42 U/L (ref 37–153)
Bilirubin, Direct: 0.1 mg/dL (ref 0.0–0.2)
Globulin: 2.2 g/dL (calc) (ref 1.9–3.7)
Indirect Bilirubin: 0.3 mg/dL (calc) (ref 0.2–1.2)
Total Bilirubin: 0.4 mg/dL (ref 0.2–1.2)
Total Protein: 6.6 g/dL (ref 6.1–8.1)

## 2020-07-08 LAB — BASIC METABOLIC PANEL
BUN: 11 mg/dL (ref 7–25)
CO2: 28 mmol/L (ref 20–32)
Calcium: 9.3 mg/dL (ref 8.6–10.4)
Chloride: 106 mmol/L (ref 98–110)
Creat: 0.7 mg/dL (ref 0.60–0.93)
Glucose, Bld: 101 mg/dL — ABNORMAL HIGH (ref 65–99)
Potassium: 4.4 mmol/L (ref 3.5–5.3)
Sodium: 141 mmol/L (ref 135–146)

## 2020-07-08 LAB — LIPID PANEL
Cholesterol: 137 mg/dL (ref <200)
HDL: 58 mg/dL (ref >=50)
LDL Cholesterol (Calc): 58 mg/dL (calc)
Non-HDL Cholesterol (Calc): 79 mg/dL (calc) (ref <130)
Total CHOL/HDL Ratio: 2.4 (calc) (ref <5.0)
Triglycerides: 124 mg/dL (ref <150)

## 2020-07-08 LAB — TSH: TSH: 1.27 mIU/L (ref 0.40–4.50)

## 2020-07-13 ENCOUNTER — Encounter: Payer: Self-pay | Admitting: Gastroenterology

## 2020-07-14 NOTE — Progress Notes (Signed)
Blood results in range blood sugar borderline elevated

## 2020-07-15 ENCOUNTER — Telehealth: Payer: Self-pay | Admitting: Internal Medicine

## 2020-07-15 ENCOUNTER — Other Ambulatory Visit: Payer: Self-pay

## 2020-07-15 MED ORDER — LEVOTHYROXINE SODIUM 88 MCG PO TABS: 88.0000 ug | ORAL_TABLET | Freq: Every day | ORAL | 0 refills | Status: DC

## 2020-07-15 NOTE — Telephone Encounter (Signed)
    Pt is returning call to get lab results 

## 2020-07-15 NOTE — Telephone Encounter (Signed)
Spoke with patient regarding lab results. 

## 2020-08-01 ENCOUNTER — Other Ambulatory Visit: Payer: Self-pay | Admitting: Hematology and Oncology

## 2020-08-01 DIAGNOSIS — E2839 Other primary ovarian failure: Secondary | ICD-10-CM

## 2020-08-17 ENCOUNTER — Telehealth: Payer: Self-pay | Admitting: Pharmacist

## 2020-08-17 NOTE — Chronic Care Management (AMB) (Signed)
I left the patient a message about her upcoming appointment on 08/18/2020 @ 9:00 am with the clinical pharmacist. She was asked to please have all medication on hand to review with the pharmacist.  Donna Manning) [name], [place] Assistant [phone]

## 2020-08-18 ENCOUNTER — Ambulatory Visit (INDEPENDENT_AMBULATORY_CARE_PROVIDER_SITE_OTHER): Payer: Medicare Other | Admitting: Pharmacist

## 2020-08-18 DIAGNOSIS — E039 Hypothyroidism, unspecified: Secondary | ICD-10-CM | POA: Diagnosis not present

## 2020-08-18 DIAGNOSIS — E785 Hyperlipidemia, unspecified: Secondary | ICD-10-CM | POA: Diagnosis not present

## 2020-08-18 NOTE — Patient Instructions (Addendum)
Hi Juliann Pulse,  It was lovely to get to meet you today over the phone! As we discussed, I will get back in touch with you about the concerns with the statin medication. In the meantime, keep taking your medications as prescribed and give me a call if you have any questions or concerns!  Best, Maddie  [name], PharmD [facility] Clinical Pharmacist [place] at [place]   Visit Information  Goals Addressed            This Visit's Progress   . Pharmacy Care Plan       CARE PLAN ENTRY (see longitudinal plan of care for additional care plan information)  Current Barriers:  . Chronic Disease Management support, education, and care coordination needs related to Hyperlipidemia, Barrett's esophagus/GERD, Hypothyroidism, Osteopenia, and Hyperglycemia  Hyperlipidemia Lab Results  Component Value Date/Time   LDLCALC 58 07/07/2020 10:24 AM   LDLDIRECT 147.2 11/23/2011 09:12 AM   . Pharmacist Clinical Goal(s): o Over the next 180 days, patient will work with PharmD and providers to maintain LDL goal < 70 . Current regimen:  o Rosuvastatin 10 mg daily  . Interventions: o Discussed benefits of taking statin medications and other possible causes of muscle pain . Patient self care activities - Over the next 180 days, patient will: o Continue current medication  Hyperglycemia Lab Results  Component Value Date/Time   HGBA1C 6.2 09/06/2019 11:47 AM   HGBA1C 6.1 06/17/2019 10:07 AM   . Pharmacist Clinical Goal(s): o Over the next  days, patient will work with PharmD and providers to maintain A1c goal <6.5% . Current regimen:  o No medications  . Interventions: o Discussed following the healthy plate method which includes: . Fill half of your plate with nonstarchy vegetables, such as spinach, broccoli, carrots and tomatoes. Venida Jarvis a quarter of your plate with a protein, such as tuna, lean pork or chicken. Venida Jarvis the last quarter with a whole-grain item, such as  brown rice, or a starchy vegetable, such as green peas or potatoes. . Include "good" fats such as nuts or avocados in small amounts. . Patient self care activities - Over the next 180 days, patient will: o Continue to modify diet and exercise  Hypothyroidism TSH  Date Value Ref Range Status  07/07/2020 1.27 0.40 - 4.50 mIU/L Final .  Pharmacist Clinical Goal(s) o Over the next 180 days, patient will work with PharmD and providers to maintain TSH: 0.35 to 4.50 uIU/mL . Current regimen:  o Levothyroxine 71mcg, 1 tablet once daily . Interventions: o We discussed:  consistent administration times of levothyroxine (at least 30 minutes before first meal)  . Patient self care activities - Over the next 180 days, patient will: o Continue current medications as directed by provider.   Barrett's esophagus/ GERD . Pharmacist Clinical Goal(s) o Over the next 180 days, patient will work with PharmD and providers to reduce acid reflux.  . Current regimen:  o Omeprazole 40 mg, 1 capsule twice daily  . Patient self care activities o Patient will continue current medications as directed by provider. Managed by gastroenterologist Silverio Decamp, Venia Minks, MD)  Osteopenia . Pharmacist Clinical Goal(s) o Over the next 180 days, patient will work with PharmD and providers to prevent bone fractures. . Current regimen:  o Multivitamin with 1000 units of vitamin D and 300 mg of calcium . Interventions: o Recommend [phone] units of vitamin D daily. Recommend 1200 mg of calcium daily from dietary and supplemental sources. Recommend weight-bearing  and muscle strengthening exercises for building and maintaining bone density . Patient self care activities o Patient will continue current medications as directed by provider.  o Complete bone density scan in June 2022  Medication management . Pharmacist Clinical Goal(s): o Over the next 180 days, patient will work with PharmD and providers to maintain optimal  medication adherence . [facility] . Interventions o Comprehensive medication review performed. o Continue current medication management strategy . Patient self care activities - Over the next 180 days, patient will: o Take medications as prescribed o Report any questions or concerns to PharmD and/or provider(s)  Please see past updates related to this goal by clicking on the "Past Updates" button in the selected goal         Patient verbalizes understanding of instructions provided today and agrees to view in [place].   Telephone follow up appointment with pharmacy team member scheduled for: 6 months  Viona Gilmore, [facility]

## 2020-08-18 NOTE — Chronic Care Management (AMB) (Signed)
Chronic Care Management Pharmacy  Name: Donna Manning  MRN: [id number] DOB: 04/10/1951  Initial Questions: 1. Have you seen any other providers since your last visit? N/A 2. Any changes in your medicines or health? No   Chief Complaint/ HPI  Donna Manning,  70 y.o. , female presents for their Follow-Up CCM visit with the clinical pharmacist via telephone due to COVID-19 Pandemic.  PCP : Burnis Medin, MD  Their chronic conditions include: Breast cancer, HLD, Hypothyroidism, Hyperglycemia, Barrett's esophagus, GERD, herpes labialis,  osteopenia  Office Visits: 04/15/20 Ofilia Neas, LPN: Patient presented for medicare annual wellness visit.  12/25/2019- [name], MD- Patient presented for video visit for rash on hands and legs.  Patient prescribed fluocinonide 0.05% topical and prednisone 54m taper.   Consult Visit: 06/15/20 BFaythe Casa RN (GI): Patient presented for EGD for Barrett's esophagus.  06/04/20 KAloha Gell(OBGYN): Unable to access notes.  05/12/20 VNicholas Lose MD (heme/onc): Patient presented for follow up. No changes made. Follow up in 1 year.  03/11/20 JMelissa Noon(optometry): Patient presented for cataracts follow up.   08/05/2019- Cardiology- PDorris Carnes MD- Patient presented for visit for chest pain. Patient to obtain ECHO to evaluate LV systolic and diastolic function.   01/21/2019- Hematology and Oncology- GNicholas Lose MD- Patient presented for office visit for annual follow up. Plan to treat for 10 years; tamoxifen (started 09/21/2014). Patient to return in 1 year.   11/22/2018- Gastroenterology- KHarl Bowie MD- Patient presented for audio visit for GERD follow up. Patient to start omeprazole 236mdaily and continue famotidine 2060mt bedtime as needed. Follow up visit in 4 to 6 weeks.   06/07/2018- Neurology- RebAlonza BogusO- Patient presented for office visit for evaluation of head tremor. Discussed Botox injections for cervical  dystonia. Patient to return as needed. Patient to return on as needed basis.    Medications: Outpatient Encounter Medications as of 08/18/2020  Medication Sig Note  . levothyroxine (SYNTHROID) 88 MCG tablet Take 1 tablet (88 mcg total) by mouth daily.   . Multiple Vitamins-Minerals (MULTIVITAMIN ADULTS 50+ PO) Take 1 tablet by mouth daily.   . oMarland Kitcheneprazole (PRILOSEC) 40 MG capsule Omeprazole 40 mg po BID for 3 months   . rosuvastatin (CRESTOR) 10 MG tablet Take 1 tablet (10 mg total) by mouth daily. 03/13/2020: Have not started  . tamoxifen (NOLVADEX) 20 MG tablet Take 1 tablet (20 mg total) by mouth daily.    No facility-administered encounter medications on file as of 08/18/2020.   Patient has been having muscle aches with her arm and leg since starting rosuvastatin. Patient was having charlie horses a couple times a week as well and has continued to take rosuvastatin. Recommended for her to drink more water and will send a message to PauDorris CarnesD.  Current Diagnosis/Assessment:  Goals Addressed            This Visit's Progress   . Pharmacy Care Plan       CARE PLAN ENTRY (see longitudinal plan of care for additional care plan information)  Current Barriers:  . Chronic Disease Management support, education, and care coordination needs related to Hyperlipidemia, Barrett's esophagus/GERD, Hypothyroidism, Osteopenia, and Hyperglycemia  Hyperlipidemia Lab Results  Component Value Date/Time   LDLCALC 58 07/07/2020 10:24 AM   LDLDIRECT 147.2 11/23/2011 09:12 AM   . Pharmacist Clinical Goal(s): o Over the next 180 days, patient will work with PharmD and providers to maintain LDL goal < 70 . Current regimen:  o Rosuvastatin  10 mg daily  . Interventions: o Discussed benefits of taking statin medications and other possible causes of muscle pain . Patient self care activities - Over the next 180 days, patient will: o Continue current medication  Hyperglycemia Lab Results  Component  Value Date/Time   HGBA1C 6.2 09/06/2019 11:47 AM   HGBA1C 6.1 06/17/2019 10:07 AM   . Pharmacist Clinical Goal(s): o Over the next  days, patient will work with PharmD and providers to maintain A1c goal <6.5% . Current regimen:  o No medications  . Interventions: o Discussed following the healthy plate method which includes: . Fill half of your plate with nonstarchy vegetables, such as spinach, broccoli, carrots and tomatoes. Venida Jarvis a quarter of your plate with a protein, such as tuna, lean pork or chicken. Venida Jarvis the last quarter with a whole-grain item, such as brown rice, or a starchy vegetable, such as green peas or potatoes. . Include "good" fats such as nuts or avocados in small amounts. . Patient self care activities - Over the next 180 days, patient will: o Continue to modify diet and exercise  Hypothyroidism TSH  Date Value Ref Range Status  07/07/2020 1.27 0.40 - 4.50 mIU/L Final .  Pharmacist Clinical Goal(s) o Over the next 180 days, patient will work with PharmD and providers to maintain TSH: 0.35 to 4.50 uIU/mL . Current regimen:  o Levothyroxine 57mg, 1 tablet once daily . Interventions: o We discussed:  consistent administration times of levothyroxine (at least 30 minutes before first meal)  . Patient self care activities - Over the next 180 days, patient will: o Continue current medications as directed by provider.   Barrett's esophagus/ GERD . Pharmacist Clinical Goal(s) o Over the next 180 days, patient will work with PharmD and providers to reduce acid reflux.  . Current regimen:  o Omeprazole 40 mg, 1 capsule twice daily  . Patient self care activities o Patient will continue current medications as directed by provider. Managed by gastroenterologist (Silverio Decamp KVenia Minks MD)  Osteopenia . Pharmacist Clinical Goal(s) o Over the next 180 days, patient will work with PharmD and providers to prevent bone fractures. . Current regimen:  o Multivitamin with  1000 units of vitamin D and 300 mg of calcium . Interventions: o Recommend [phone] units of vitamin D daily. Recommend 1200 mg of calcium daily from dietary and supplemental sources. Recommend weight-bearing and muscle strengthening exercises for building and maintaining bone density . Patient self care activities o Patient will continue current medications as directed by provider.  o Complete bone density scan in June 2022  Medication management . Pharmacist Clinical Goal(s): o Over the next 180 days, patient will work with PharmD and providers to maintain optimal medication adherence . [facility] . Interventions o Comprehensive medication review performed. o Continue current medication management strategy . Patient self care activities - Over the next 180 days, patient will: o Take medications as prescribed o Report any questions or concerns to PharmD and/or provider(s)  Please see past updates related to this goal by clicking on the "Past Updates" button in the selected goal        Breast cancer   Patient is currently controlled on the following medications:  . Tamoxifen 246m 1 tablet once daily   Plan Managed by oncology (has annual visit) Continue current medications   Hyperlipidemia   LDL goal < 100  Lipid Panel     Component Value Date/Time   CHOL 137 07/07/2020 1024  TRIG 124 07/07/2020 1024   HDL 58 07/07/2020 1024   LDLCALC 58 07/07/2020 1024   LDLDIRECT 147.2 11/23/2011 0912    Hepatic Function Latest Ref Rng & Units 07/07/2020 06/17/2019 06/04/2018  Total Protein 6.1 - 8.1 g/dL 6.6 6.6 6.8  Albumin 3.5 - 5.2 g/dL - 4.0 4.3  AST 10 - 35 U/L '19 23 16  ' ALT 6 - 29 U/L '14 19 12  ' Alk Phosphatase 39 - 117 U/L - 31(L) 35(L)  Total Bilirubin 0.2 - 1.2 mg/dL 0.4 0.6 0.4  Bilirubin, Direct 0.0 - 0.2 mg/dL 0.1 0.1 0.1     The 10-year ASCVD risk score Mikey Bussing DC Jr., et al., 2013) is: 7.7%   Values used to calculate the score:     Age: 16 years      Sex: Female     Is Non-Hispanic African American: No     Diabetic: No     Tobacco smoker: No     Systolic Blood Pressure: 081 mmHg     Is BP treated: No     HDL Cholesterol: 58 mg/dL     Total Cholesterol: 137 mg/dL   Patient is currently uncontrolled on the following medications:  . Rosuvastatin 10 mg 1 tablet daily  We discussed:  specific components of lipid panel and her results for each  Plan Sent Dr. Harrington Challenger message regarding statin muscle aches and if patient would be ok to try every other day administration or switch to an alternative statin.  Hypothyroidism   Lab Results  Component Value Date/Time   TSH 1.27 07/07/2020 10:24 AM   TSH 1.13 06/17/2019 10:07 AM   FREET4 0.90 06/17/2019 10:07 AM   FREET4 0.96 06/24/2010 11:15 AM    Patient is currently controlled on the following medications:  . Levothyroxine 67mg, 1 tablet once daily   We discussed:  consistent administration times of levothyroxine (at least 30 minutes before first meal)   Plan Continue current medications   PreDiabetes   Recent Relevant Labs: Lab Results  Component Value Date/Time   HGBA1C 6.2 09/06/2019 11:47 AM   HGBA1C 6.1 06/17/2019 10:07 AM   EGFR 77 (L) 09/08/2016 10:07 AM     Patient is currently controlled on the following medications:   No medications  We discussed: diet and exercise extensively  --Following the healthy plate method which includes: . Fill half of your plate with nonstarchy vegetables, such as spinach, broccoli, carrots and tomatoes. .Venida Jarvisa quarter of your plate with a protein, such as tuna, lean pork or chicken. .Venida Jarvisthe last quarter with a whole-grain item, such as brown rice, or a starchy vegetable, such as green peas or potatoes. . Include "good" fats such as nuts or avocados in small amounts.  Plan Continue current medications  Barrett's esophagus, GERD   Patient has been on these meds in past: none  Patient is currently controlled on the following  medications:  . Omeprazole 40 mg 1 capsule twice daily  We discussed: long term risks of taking PPIs such as increased risk for bone fractures - patient plans to discuss her concerns with GI at next appt.  Plan Managed by GI.  Continue current medications  Herpes labialis    Patient is currently controlled on the following medications:  . Valacyclovir 10035m 2 tablets twice daily as needed  Plan  Continue current medications   Osteoporosis   Last DEXA Scan: 06/16/2014  T-Score femoral neck: -2.2 (left)  T-Score lumbar spine: -2.6  10-year probability  of major osteoporotic fracture: N/A  10-year probability of hip fracture: N/A  Vit D, 25-Hydroxy  Date Value Ref Range Status  06/24/2010 41 30 - 89 ng/mL Final    Comment:    See lab report for associated comment(s)    Patient is a candidate for pharmacologic treatment due to T-Score < -2.5 in lumbar spine  Patient is currently on the following medications:  Marland Kitchen Multivitamin with vitamin D 1000 units with calcium 300 mg  Per chart, patient refused in the past   We discussed:  Recommend [phone] units of vitamin D daily. Recommend 1200 mg of calcium daily from dietary and supplemental sources. Recommend weight-bearing and muscle strengthening exercises for building and maintaining bone density. -Calcium intake: 1-2 glasses of milk/day, cheese , ocassional yogurt   Plan DEXA scan scheduled for 12/16/2020 at [place].  Vaccines   Reviewed patient's vaccination history.    Immunization History  Administered Date(s) Administered  . Influenza Split 05/27/2012, 06/02/2020  . Influenza, High Dose Seasonal PF 07/17/2015, 06/27/2016, 05/24/2017  . Influenza-Unspecified 05/24/2018  . Moderna SARS-COV2 Booster Vaccination 05/18/2020  . Moderna Sars-Covid-2 Vaccination 07/31/2019, 08/28/2019  . Pneumococcal Conjugate-13 05/24/2017  . Pneumococcal Polysaccharide-23 06/04/2018  . Tdap 11/30/2011  .  Zoster Recombinat (Shingrix) 04/12/2017, 08/23/2017   Confirmed COVID booster with [facility] and updated immunization history.  Plan Patient is up to date on all immunizations.  Medication Management  Patient organizes medications:  - patient reports has own strategy and states remembers daily  [facility] Adherence: no gaps in refill history (per medication dispense history 08/16/19 to 02/12/20)     Follow up Follow up visit with PharmD in 6 months.   [name], PharmD [facility] Clinical Pharmacist [place] at [place]

## 2020-08-19 ENCOUNTER — Telehealth: Payer: Self-pay | Admitting: Pharmacist

## 2020-08-19 NOTE — Telephone Encounter (Signed)
Called patient to follow up on discussion about statin medication yesterday and after reaching out to Dr. Harrington Challenger to discuss.  Dr. Harrington Challenger recommended holding rosuvastatin for a few weeks to see if this resolves the muscle aches and will reassess from there.  Patient is aware of the plan to stop taking rosuvastatin. Will follow up in 3 weeks to determine if this was the culprit of the pain.

## 2020-09-07 NOTE — Progress Notes (Signed)
Cardiology Office Note   Date:  09/08/2020   ID:  Donna, Manning 1950-11-08, MRN [id number]  PCP:  Burnis Medin, MD  Cardiologist:   Dorris Carnes, MD   Pt presents for eval  of CP   History of Present Illness: Donna Manning is a 70 y.o. female with a history of chest pain.  She is followed by [name].  She was seen in clinic back in May 2020.  Per that clinic note, chest pain lasted minutes at night about 3 times per week.  Would awaken her.  On and off.  Was left-sided with occasional jaw discomfort.  Patient at the time thought the jaw discomfort was related to teeth clenching also had some left upper arm pain.  With activity the patient did not have spells. Felt to be noncardiac  I saw the pt in Jan 2021  AAt the time she was SOB   She had some wheezing on exam   Set up for echo This was normal   She was also set up for [place] CT angiogram done   CA score was 0  Coronary arteries normal  The pt called into Dr [name] office complaining of L sided CP   She was told to make appt with cardiology   She says about 1 wk ago she started having L sided CP   Constant   Worse at night   Denies injury   NO Fevers/ chills/cough   Worse with movment    Also some L arm pain       Current Meds  Medication Sig  . levothyroxine (SYNTHROID) 88 MCG tablet Take 1 tablet (88 mcg total) by mouth daily.  . Multiple Vitamins-Minerals (MULTIVITAMIN ADULTS 50+ PO) Take 1 tablet by mouth daily.  Marland Kitchen omeprazole (PRILOSEC) 40 MG capsule Take 40 mg by mouth in the morning and at bedtime.  . tamoxifen (NOLVADEX) 20 MG tablet Take 1 tablet (20 mg total) by mouth daily.     Allergies:   Contrast media [iodinated diagnostic agents], Actonel [risedronate sodium], and Starch   Past Medical History:  Diagnosis Date  . 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 Barrett's esophagus 2009  . Benign head tremor   . Breast cancer, right breast ([facility]) 01/2014   "had 5 weeks  radiation after lumpectomy" (04/24/2018)  . Dental bridge present    lower left  . Dental crowns present   . GERD (gastroesophageal reflux disease)   . History of DVT (deep vein thrombosis) ~ 1980   after a pregnancy  . History of radiation therapy 2015  . Hyperlipidemia   . Hypothyroidism   . Irregular heart beat    "dx'd by 1 dr; not noted by the cardiologist" (04/24/2018)  . NSAID-induced gastric ulcer   . Osteoporosis   . PONV (postoperative nausea and vomiting)     Past Surgical History:  Procedure Laterality Date  . BREAST BIOPSY Right 1991  . BREAST EXCISIONAL BIOPSY Left 2019  . BREAST LUMPECTOMY Right 1991  . BREAST LUMPECTOMY W/ NEEDLE LOCALIZATION Left 04/24/2018  . BREAST LUMPECTOMY WITH NEEDLE LOCALIZATION Left 04/24/2018   Procedure: LEFT BREAST LUMPECTOMY WITH NEEDLE LOCALIZATION;  Surgeon: [name], MD;  Location: [facility];  Service: General;  Laterality: Left;  . COLONOSCOPY     "w/hemorrhoid banding"  . COLOSTOMY  2009  . PARTIAL MASTECTOMY WITH NEEDLE LOCALIZATION AND AXILLARY SENTINEL LYMPH NODE BX Right 01/20/2014   Procedure: RIGHT  PARTIAL MASTECTOMY WITH DOUBLE  NEEDLE LOCALIZATION (BRACKETED )AND AXILLARY SENTINEL LYMPH NODE BX;  Surgeon: [name], MD;  Location: [facility];  Service: General;  Laterality: Right;  And [place].  Marland Kitchen RE-EXCISION OF BREAST CANCER,SUPERIOR MARGINS Right 02/05/2014   Procedure: RIGHT LUMPECTOMY, RE-EXCISION OF BREAST CANCER,SUPERIOR MARGINS;  Surgeon: [name], MD;  Location: [facility];  Service: General;  Laterality: Right;  . UPPER GASTROINTESTINAL ENDOSCOPY  2016  . UPPER GI ENDOSCOPY  12/25/2014   with Propofol     Social History:  The patient  reports that she has never smoked. She has never used smokeless tobacco. She reports previous alcohol use. She reports that she does not use drugs.   Family History:  The patient's family history includes Diabetes in  her mother; Heart failure (age of onset: 57) in her mother; Parkinsonism (age of onset: 65) in her father; Prostate cancer in her brother; Stomach cancer in her paternal grandfather; Stroke in her father; Thyroid disease in her sister.    ROS:  Please see the history of present illness. All other systems are reviewed and  Negative to the above problem except as noted.    PHYSICAL EXAM: VS:  BP 100/70   Pulse 82   Ht '5\' 7"'  (1.702 m)   Wt 179 lb 12.8 oz (81.6 kg)   SpO2 96%   BMI 28.16 kg/m   GEN: Well nourished, well developed, in no acute distress  HEENT: normal  Neck: no JVD, no carotid bruits, Cardiac: RRR; no murmurs, rubs, or gallops,no edema  Respiratory: Moving air  Pops bilaterally   Breathing is guarded due to pain   Chest very tender L anterior axillarry line below breast   GI: soft, nontender, nondistended, + BS  No hepatomegaly  MS: no deformity Moving all extremities   Skin: warm and dry, no rash Neuro:  Strength and sensation are intact Psych: euthymic mood, full affect   EKG:  EKG is ordered today.  Sinus rhythm 82 bpm   Early transion    Echo:   1. Left ventricular ejection fraction, by visual estimation, is 55 to 60%. The left ventricle has normal function. There is no left ventricular hypertrophy. 2. The left ventricle has no regional wall motion abnormalities. 3. Normal GLS -18.6. 4. Global right ventricle has normal systolic function.The right ventricular size is normal. No increase in right ventricular wall thickness. 5. Left atrial size was normal. 6. Right atrial size was normal. 7. Mild mitral annular calcification. 8. The mitral valve is normal in structure. Trivial mitral valve regurgitation. No evidence of mitral stenosis. 9. The tricuspid valve is normal in structure. 10. The tricuspid valve is normal in structure. Tricuspid valve regurgitation is trivial. 11. The aortic valve is tricuspid. Aortic valve regurgitation is not visualized. Mild aortic  valve sclerosis without stenosis. 12. The pulmonic valve was normal in structure. Pulmonic valve regurgitation is not visualized. 13. The inferior vena cava is normal in size with greater than 50% respiratory variability, suggesting right atrial pressure of 3 mmHg.  Aorta: Normal size. Ascending aorta 2.8 cm. No calcifications. No dissection.  Aortic Valve:  Trileaflet.  No calcifications.  Coronary Arteries: Left circumflex arises from D1.  Right dominance.  RCA is a large dominant artery that gives rise to PDA and PLVB. There is no plaque.  Left main is a large artery that gives rise to LAD and LCX arteries.  LAD is a large vessel that has no plaque.  There is a large, branching D1. The left circumflex is a branch of D1. D2 and D3 are small.  LCX is a non-dominant artery that gives rise to one large OM1 branch. There is no plaque.  Other findings:  Normal pulmonary vein drainage into the left atrium.  Normal let atrial appendage without a thrombus.  Normal size of the pulmonary artery.  IMPRESSION: 1. Coronary calcium score of 0. This was 0 percentile for age and sex matched control.  2.  Right dominance.  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  3. No evidence of CAD.  Skeet Latch, MD  Lipid Panel    Component Value Date/Time   CHOL 137 07/07/2020 1024   TRIG 124 07/07/2020 1024   HDL 58 07/07/2020 1024   CHOLHDL 2.4 07/07/2020 1024   VLDL 21.0 06/17/2019 1007   LDLCALC 58 07/07/2020 1024   LDLDIRECT 147.2 11/23/2011 0912      Wt Readings from Last 3 Encounters:  09/08/20 179 lb 12.8 oz (81.6 kg)  06/24/20 180 lb (81.6 kg)  06/15/20 180 lb (81.6 kg)      ASSESSMENT AND PLAN:  1.  Chest pain   Pain today consstent with musculoskel pai   Very tender to palpation along L ribs  No injury    Lung exam is also abnormal   SHe may have been guarding,taking small breaths due to pain   WIll order CBC, BMET, ESR   ALso set up for CXR with  attention to ribs  I would recommend for setting up for an echocardiogram to evaluate LV systolic and diastolic function.  Depending on the results further work-up with possible CT scan or other.  Take activities as tolerated.  2  Lipids  Excellent    IN Dec 2021:  LDL 58  HDL 58     Follow-up based on test results.   Current medicines are reviewed at length with the patient today.  The patient does not have concerns regarding medicines.  Signed, Dorris Carnes, MD  09/08/2020 2:54 PM    [facility] [address], [place], [place]  [address] Phone: [phone]; Fax: [phone]

## 2020-09-08 ENCOUNTER — Encounter: Payer: Self-pay | Admitting: Internal Medicine

## 2020-09-08 ENCOUNTER — Ambulatory Visit
Admission: RE | Admit: 2020-09-08 | Discharge: 2020-09-08 | Disposition: A | Discharge status: Home / Self Care | Payer: Medicare Other | Source: Ambulatory Visit | Attending: Internal Medicine | Admitting: Internal Medicine

## 2020-09-08 ENCOUNTER — Other Ambulatory Visit: Payer: Self-pay

## 2020-09-08 ENCOUNTER — Ambulatory Visit (INDEPENDENT_AMBULATORY_CARE_PROVIDER_SITE_OTHER): Payer: Medicare Other | Admitting: Internal Medicine

## 2020-09-08 VITALS — BP 100/70 | HR 82 | Ht 67.0 in | Wt 179.8 lb

## 2020-09-08 DIAGNOSIS — I2510 Atherosclerotic heart disease of native coronary artery without angina pectoris: Secondary | ICD-10-CM

## 2020-09-08 DIAGNOSIS — M40204 Unspecified kyphosis, thoracic region: Secondary | ICD-10-CM | POA: Diagnosis not present

## 2020-09-08 DIAGNOSIS — R079 Chest pain, unspecified: Secondary | ICD-10-CM

## 2020-09-08 DIAGNOSIS — J8489 Other specified interstitial pulmonary diseases: Secondary | ICD-10-CM | POA: Diagnosis not present

## 2020-09-08 DIAGNOSIS — M47814 Spondylosis without myelopathy or radiculopathy, thoracic region: Secondary | ICD-10-CM | POA: Diagnosis not present

## 2020-09-08 DIAGNOSIS — R062 Wheezing: Secondary | ICD-10-CM

## 2020-09-08 DIAGNOSIS — R0781 Pleurodynia: Secondary | ICD-10-CM | POA: Diagnosis not present

## 2020-09-08 NOTE — Patient Instructions (Signed)
Medication Instructions:  No changes *If you need a refill on your cardiac medications before your next appointment, please call your pharmacy*   Lab Work: Today: cbc, bmet, sed rate  If you have labs (blood work) drawn today and your tests are completely normal, you will receive your results only by: Marland Kitchen MyChart Message (if you have MyChart) OR . A paper copy in the mail If you have any lab test that is abnormal or we need to change your treatment, we will call you to review the results.   Testing/Procedures: A chest x-ray takes a picture of the organs and structures inside the chest, including the heart, lungs, and blood vessels. This test can show several things, including, whether the heart is enlarges; whether fluid is building up in the lungs; and whether pacemaker / defibrillator leads are still in place.   Follow-Up: Based on results  Other Instructions

## 2020-09-09 LAB — SEDIMENTATION RATE: Sed Rate: 2 mm/hr (ref 0–40)

## 2020-09-09 LAB — CBC
Hematocrit: 37.8 % (ref 34.0–46.6)
Hemoglobin: 12.5 g/dL (ref 11.1–15.9)
MCH: 30.9 pg (ref 26.6–33.0)
MCHC: 33.1 g/dL (ref 31.5–35.7)
MCV: 93 fL (ref 79–97)
Platelets: 257 10*3/uL (ref 150–450)
RBC: 4.05 x10E6/uL (ref 3.77–5.28)
RDW: 12.3 % (ref 11.7–15.4)
WBC: 6.1 10*3/uL (ref 3.4–10.8)

## 2020-09-09 LAB — BASIC METABOLIC PANEL
BUN/Creatinine Ratio: 18 (ref 12–28)
BUN: 15 mg/dL (ref 8–27)
CO2: 20 mmol/L (ref 20–29)
Calcium: 9.1 mg/dL (ref 8.7–10.3)
Chloride: 105 mmol/L (ref 96–106)
Creatinine, Ser: 0.84 mg/dL (ref 0.57–1.00)
GFR calc Af Amer: 81 mL/min/{1.73_m2} (ref >59)
GFR calc non Af Amer: 71 mL/min/{1.73_m2} (ref >59)
Glucose: 75 mg/dL (ref 65–99)
Potassium: 5 mmol/L (ref 3.5–5.2)
Sodium: 142 mmol/L (ref 134–144)

## 2020-09-11 ENCOUNTER — Telehealth: Payer: Self-pay | Admitting: Pharmacist

## 2020-09-11 ENCOUNTER — Telehealth: Payer: Self-pay | Admitting: *Deleted

## 2020-09-11 DIAGNOSIS — R0602 Shortness of breath: Secondary | ICD-10-CM

## 2020-09-11 DIAGNOSIS — R079 Chest pain, unspecified: Secondary | ICD-10-CM

## 2020-09-11 DIAGNOSIS — R918 Other nonspecific abnormal finding of lung field: Secondary | ICD-10-CM

## 2020-09-11 IMAGING — MG BREAST SURGICAL SPECIMEN
1 series · 1 of 1 positions shown · non-contrast
Comparison: Previous exam(s).

CLINICAL DATA: Status post mammographically guided needle
localization of a left breast lesion followed by surgical excision.

EXAM:
SPECIMEN RADIOGRAPH OF THE LEFT BREAST

[L]
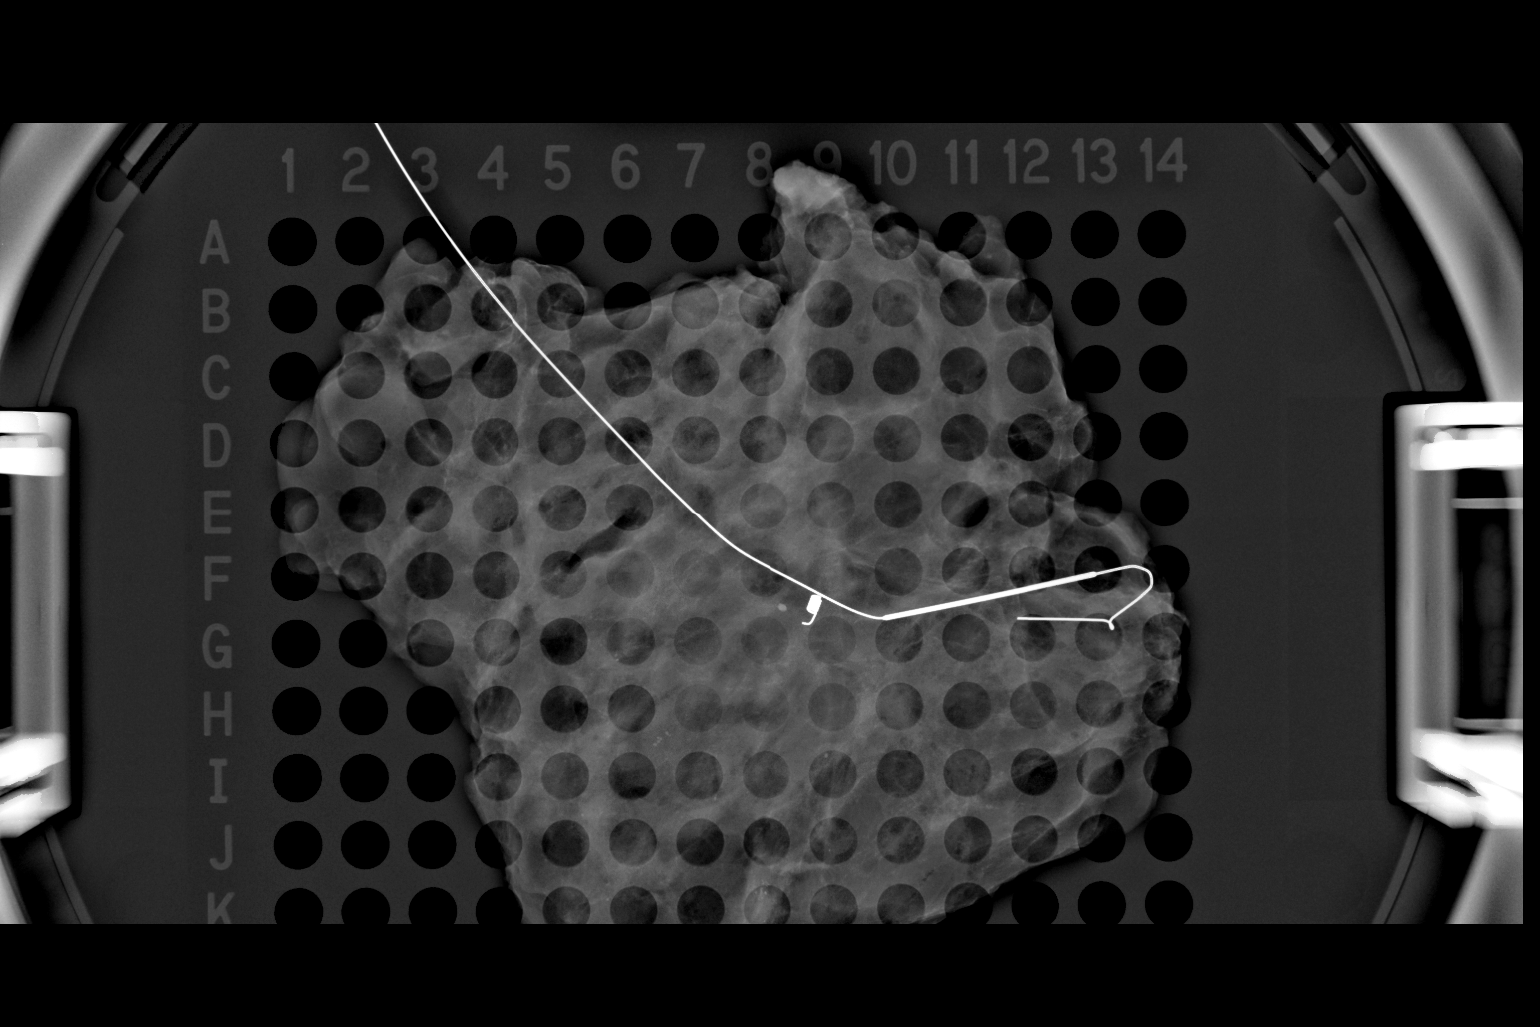

[1 of 1 positions shown; findings below may reference images not displayed]

FINDINGS: Status post excision of the left breast. The wire tip and biopsy
marker clip are present and are marked for pathology.
IMPRESSION: Specimen radiograph of the left breast.

## 2020-09-11 NOTE — Chronic Care Management (AMB) (Signed)
    Chronic Care Management Pharmacy Assistant   Name: Donna Manning  MRN: [id number] DOB: 1950/09/06  Reason for Encounter: General Adherence Call Patient Questions:  1.  Have you seen any other providers since your last visit? No  2.  Any changes in your medicines or health? No   PCP : Burnis Medin, MD  Allergies:   Allergies  Allergen Reactions  . Contrast Media [Iodinated Diagnostic Agents] Hives and Shortness Of Breath  . Actonel [Risedronate Sodium] Other (See Comments)    HIP PAIN  . Starch Rash    IN LATEX GLOVES    Medications: Outpatient Encounter Medications as of 09/11/2020  Medication Sig  . levothyroxine (SYNTHROID) 88 MCG tablet Take 1 tablet (88 mcg total) by mouth daily.  . Multiple Vitamins-Minerals (MULTIVITAMIN ADULTS 50+ PO) Take 1 tablet by mouth daily.  Marland Kitchen omeprazole (PRILOSEC) 40 MG capsule Take 40 mg by mouth in the morning and at bedtime.  . tamoxifen (NOLVADEX) 20 MG tablet Take 1 tablet (20 mg total) by mouth daily.   No facility-administered encounter medications on file as of 09/11/2020.    Current Diagnosis: Patient Active Problem List   Diagnosis Date Noted  . Aspiration into airway 04/24/2018  . Dyspepsia 10/27/2014  . Colon cancer screening 10/27/2014  . Breast cancer of upper-outer quadrant of right female breast ([facility] [place]) 12/31/2013  . Visit for preventive health examination 12/03/2011  . High triglycerides 12/03/2011  . Barrett's esophagus   . Osteopenia   . CAROTID BRUIT 07/16/2010  . WEIGHT LOSS 06/24/2010  . NONSPECIFIC ABNORMAL ELECTROCARDIOGRAM 06/24/2010  . SKIN LESION, UNCERTAIN SIGNIFICANCE 06/24/2010  . SOLAR KERATOSIS 06/24/2010  . FATIGUE 06/01/2009  . CHEST PAIN, ATYPICAL 06/01/2009  . HERPES LABIALIS 12/10/2008  . DYSFUNCTION OF EUSTACHIAN TUBE 12/10/2008  . ESOPHAGITIS 06/21/2008  . GASTRITIS 06/21/2008  . Hypothyroidism 05/12/2008  . ADVERSE REACTION TO MEDICATION 05/12/2008  . HYPERLIPIDEMIA 04/24/2007  .  GERD 04/24/2007  . OSTEOPENIA 04/24/2007  . DVT, HX OF 04/24/2007    Goals Addressed   None     Follow-Up:  Pharmacist Review  I spoke with the patient and discussed medication adherence with the patient. We spoke about the effects of the changes from her medications from her last appointment. She states that she is feeling much better period her leg cramps stopped about a week from discontinuing the Rosuvastatin . She stated that she spoke with Dr. Harrington Challenger about this medication and it was discontinued. No other changes to her medication currently. She denies ED visits since his last CPP follow-up. She also denies any side effects with his medication and any problems with his current pharmacy.  Maia Breslow, [place] Assistant [phone]

## 2020-09-11 NOTE — Telephone Encounter (Signed)
Patient feels the pain is resolving.   Does not want another CXR at this time.   She asked if she is getting a pulmonologist appointment.  We have not done a referral as of this time.  Will forward to Dr. Harrington Challenger to let her know.

## 2020-09-11 NOTE — Telephone Encounter (Signed)
-----   Message from Fay Records, MD sent at 09/11/2020 11:28 AM EST ----- REviewed with patient  I have also reviewed with radiology    Would recomm dedicated rib films on L side to rule out nondisplaced fracture

## 2020-09-13 NOTE — Telephone Encounter (Signed)
Please refer to pulmonary.  SOB, abnormal lung exam

## 2020-09-14 IMAGING — DX DG CHEST 2V
2 series · 2 of 2 positions shown · non-contrast
Comparison: Chest radiograph 04/25/2018

CLINICAL DATA: Patient with history of left lumpectomy. Aspiration
pneumonia.

EXAM:
CHEST - 2 VIEW

[chest pa]
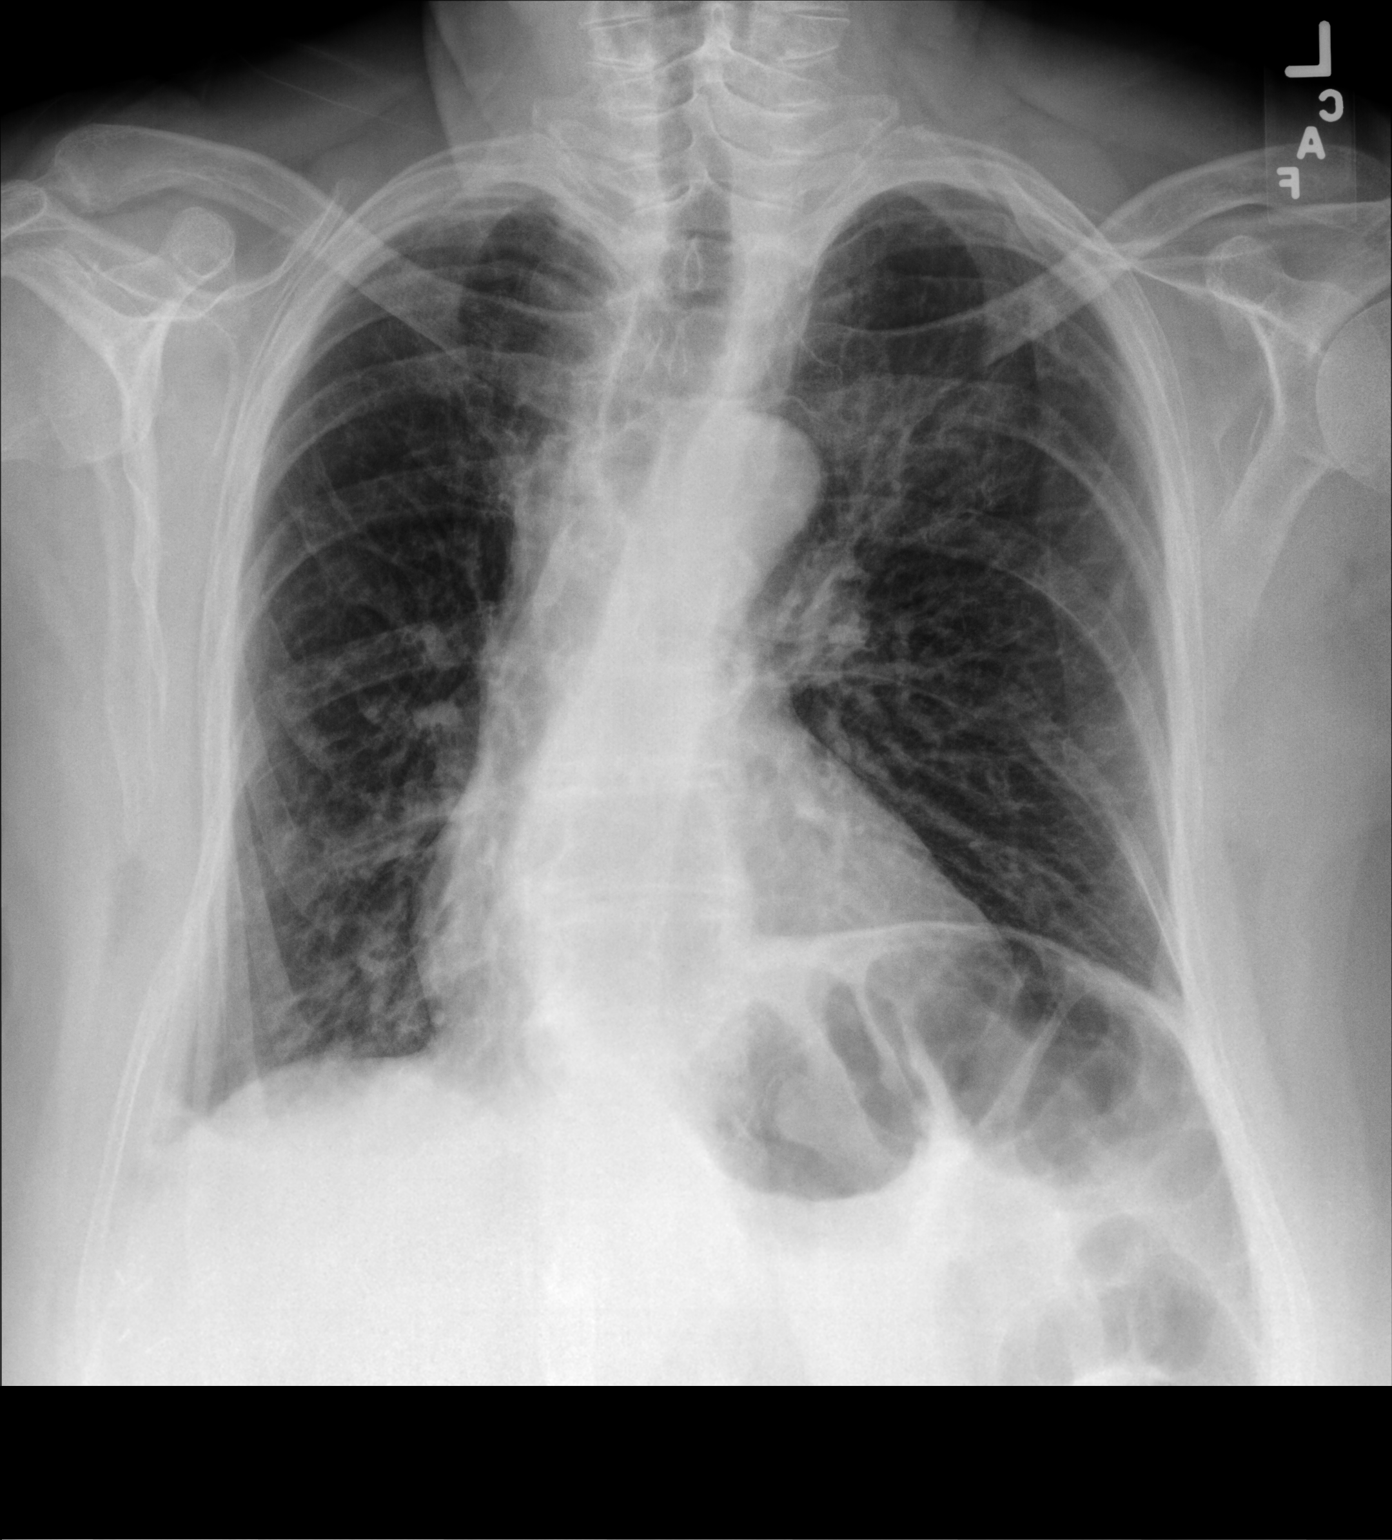

[chest lat]
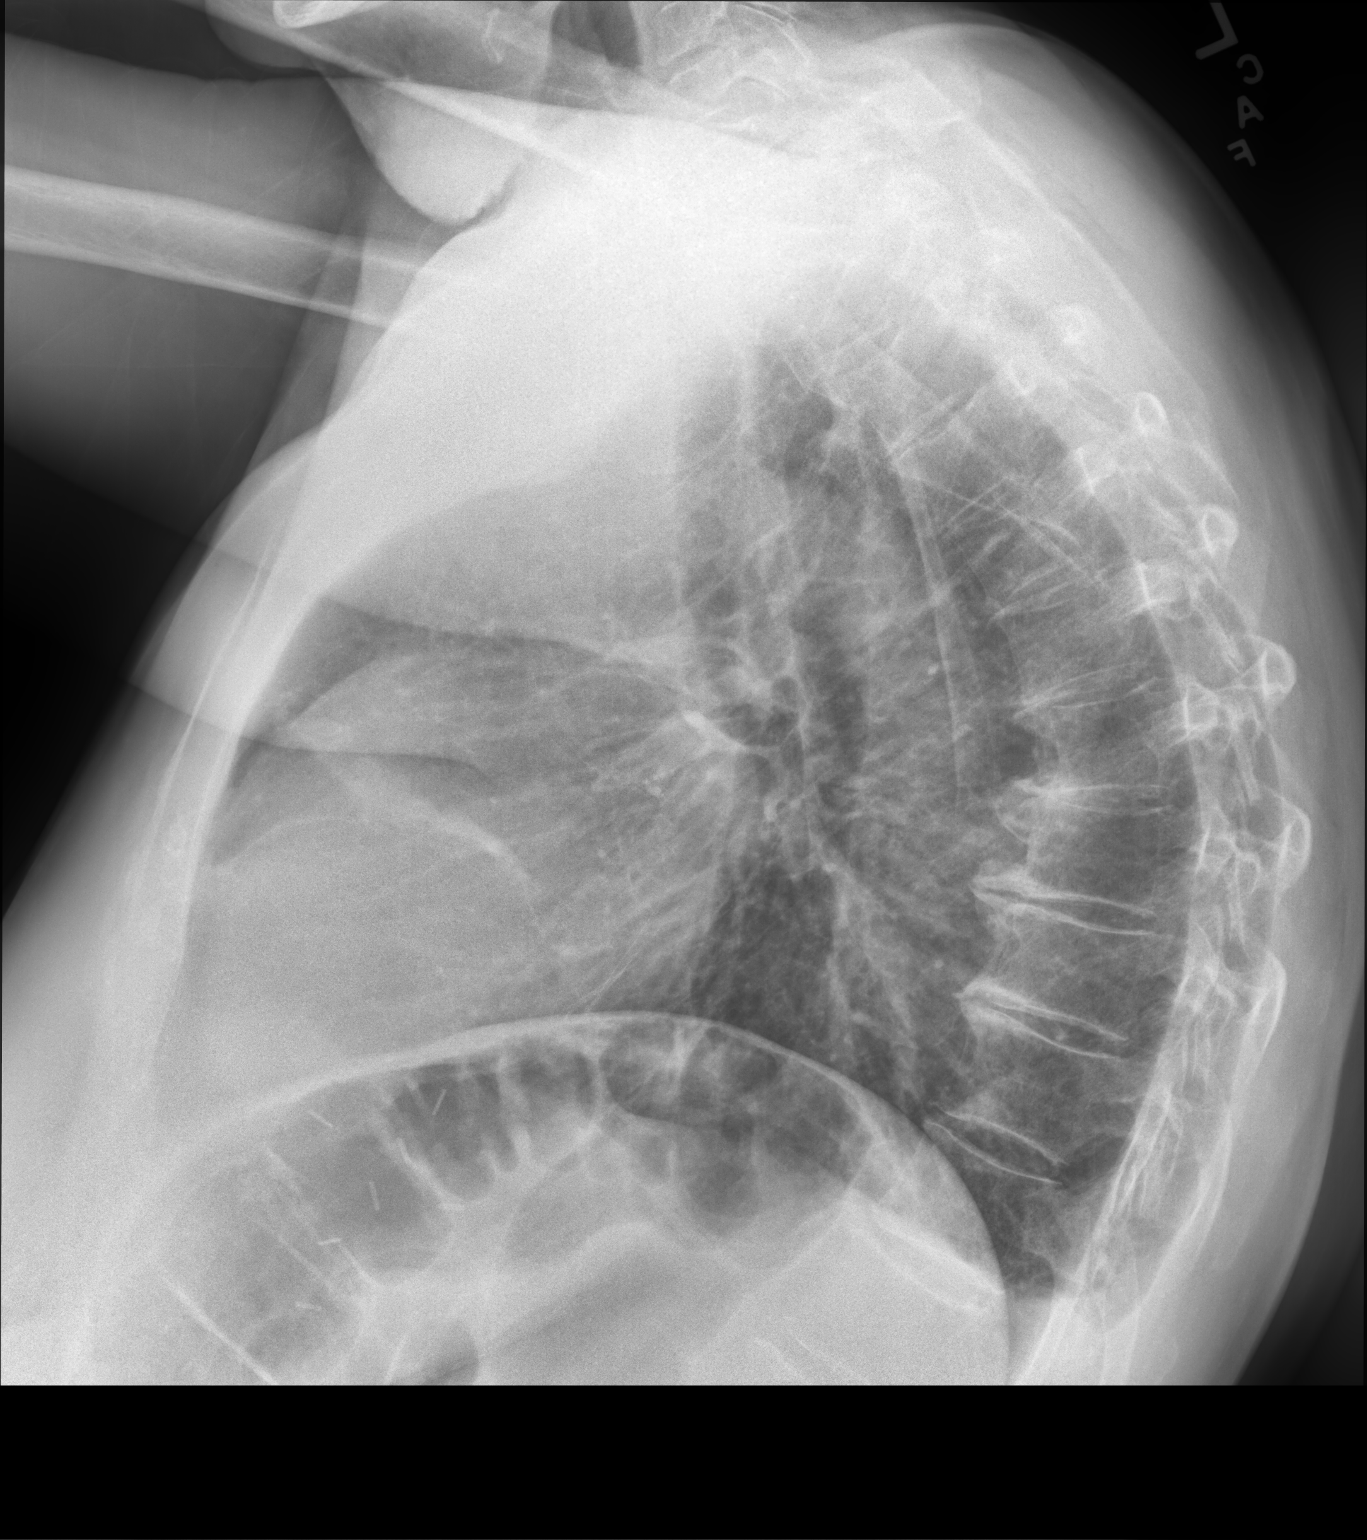

[2 of 2 positions shown; findings below may reference images not displayed]

FINDINGS: Stable enlarged cardiac and mediastinal contours. Significant
interval improvement in previously described masslike consolidation
within the right lower hemithorax. Minimal heterogeneous opacities
persist. No pleural effusion or pneumothorax. Thoracic spine
degenerative changes.
IMPRESSION: Significant interval improvement and near complete resolution of
previously described consolidation right lower hemithorax.

## 2020-10-07 ENCOUNTER — Ambulatory Visit (INDEPENDENT_AMBULATORY_CARE_PROVIDER_SITE_OTHER): Payer: Medicare Other | Admitting: Pulmonary Disease

## 2020-10-07 ENCOUNTER — Other Ambulatory Visit: Payer: Self-pay

## 2020-10-07 ENCOUNTER — Encounter: Payer: Self-pay | Admitting: Pulmonary Disease

## 2020-10-07 VITALS — BP 118/72 | HR 85 | Temp 97.9°F | Ht 67.0 in | Wt 182.4 lb

## 2020-10-07 DIAGNOSIS — R0689 Other abnormalities of breathing: Secondary | ICD-10-CM | POA: Diagnosis not present

## 2020-10-07 DIAGNOSIS — R0989 Other specified symptoms and signs involving the circulatory and respiratory systems: Secondary | ICD-10-CM

## 2020-10-07 DIAGNOSIS — R0609 Other forms of dyspnea: Secondary | ICD-10-CM

## 2020-10-07 DIAGNOSIS — R0600 Dyspnea, unspecified: Secondary | ICD-10-CM | POA: Diagnosis not present

## 2020-10-07 DIAGNOSIS — R079 Chest pain, unspecified: Secondary | ICD-10-CM | POA: Diagnosis not present

## 2020-10-07 NOTE — Progress Notes (Signed)
@Patient  ID: Donna Manning, female    DOB: [birth year], 70 y.o.   MRN: [id number]  Chief Complaint  Patient presents with  . Consult    Has occasional wheezing for past year, shortness of breath with exertion    Referring provider: Fay Records, MD  HPI:   70 year old woman whom we are seeing in consultation for evaluation of abnormal lung sounds.  Most recent PCP note reviewed.  Most recent cardiology note, referring provider, reviewed.  Patient denies history of respiratory issues.  Denies any cough.  Denies any exertional limitation to the shortness of breath.  She says occasionally she has a wheeze but is not very bothersome.  She does get dyspnea on exertion with longer distance walks, stairs or inclines.  She endorses seasonal allergy symptoms.  Denies heartburn or reflux symptoms.  She has been evaluated by cardiologist for some shortness of breath.  Work-up largely reassuring.  TTE 1/21 reviewed and reassuring.  CT coronary 08/2019 reviewed interpreted as clear lung fields acknowledging cannot view entirety of lungs given focused on heart.  States that last visit she had abnormal sounds, POP.  Patient unable to say where this lung sounds were heard.  Chest x-ray 09/09/2020 reviewed after my interpretation reveals elevated left hemidiaphragm suspicious for diaphragmatic paralysis, stomach bubble right under diaphragm, otherwise clear lungs.  PMH: Angina, sinus issues Surgical history: Breast surgery for cancer Family history: Coronary disease in mother, no respiratory illnesses in first-degree relatives Social history: Never smoker, retired, lives in [place] / Pulmonary Flowsheets:   ACT:  No flowsheet data found.  MMRC: mMRC Dyspnea Scale mMRC Score  10/07/2020 2    Epworth:  No flowsheet data found.  Tests:   FENO:  No results found for: NITRICOXIDE  PFT: No flowsheet data found.  WALK:  No flowsheet data found.  Imaging: Personally  reviewed and as per EMR discussion in this note DG Chest 2 View  Result Date: 09/09/2020 CLINICAL DATA:  70 year old female with tenderness at the left anterior ribs EXAM: CHEST - 2 VIEW COMPARISON:  11/26/2010 FINDINGS: Cardiomediastinal silhouette unchanged in size and contour. No pneumothorax. No pleural effusion. Mild coarsened interstitial markings with pleuroparenchymal thickening at the apices. No displaced fracture identified. Degenerative changes of the thoracic spine with accentuated kyphotic curvature. IMPRESSION: Negative for acute cardiopulmonary disease. No acute displaced fracture identified. Electronically Signed   By: Corrie Mckusick D.O.   On: 09/09/2020 15:52    Lab Results: Personally reviewed, notably eosinophils as high as 300 CBC    Component Value Date/Time   WBC 6.1 09/08/2020 1514   WBC 5.6 09/06/2019 1147   RBC 4.05 09/08/2020 1514   RBC 4.09 09/06/2019 1147   HGB 12.5 09/08/2020 1514   HGB 12.2 01/01/2014 0838   HCT 37.8 09/08/2020 1514   HCT 37.2 01/01/2014 0838   PLT 257 09/08/2020 1514   MCV 93 09/08/2020 1514   MCV 90.7 01/01/2014 0838   MCH 30.9 09/08/2020 1514   MCH 30.4 04/25/2018 0346   MCHC 33.1 09/08/2020 1514   MCHC 33.1 09/06/2019 1147   RDW 12.3 09/08/2020 1514   RDW 12.9 01/01/2014 0838   LYMPHSABS 1.9 09/06/2019 1147   LYMPHSABS 1.4 01/01/2014 0838   MONOABS 0.4 09/06/2019 1147   MONOABS 0.4 01/01/2014 0838   EOSABS 0.2 09/06/2019 1147   EOSABS 0.3 01/01/2014 0838   BASOSABS 0.1 09/06/2019 1147   BASOSABS 0.1 01/01/2014 0838    BMET    Component Value Date/Time  NA 142 09/08/2020 1515   NA 141 09/08/2016 1007   K 5.0 09/08/2020 1515   K 4.3 09/08/2016 1007   CL 105 09/08/2020 1515   CO2 20 09/08/2020 1515   CO2 26 09/08/2016 1007   GLUCOSE 75 09/08/2020 1515   GLUCOSE 101 (H) 07/07/2020 1024   GLUCOSE 104 09/08/2016 1007   BUN 15 09/08/2020 1515   BUN 12.4 09/08/2016 1007   CREATININE 0.84 09/08/2020 1515   CREATININE  0.70 07/07/2020 1024   CREATININE 0.8 09/08/2016 1007   CALCIUM 9.1 09/08/2020 1515   CALCIUM 9.1 09/08/2016 1007   GFRNONAA 71 09/08/2020 1515   GFRAA 81 09/08/2020 1515    BNP No results found for: BNP  ProBNP No results found for: PROBNP  Specialty Problems      Pulmonary Problems   Aspiration into airway      Allergies  Allergen Reactions  . Contrast Media [Iodinated Diagnostic Agents] Hives and Shortness Of Breath  . Actonel [Risedronate Sodium] Other (See Comments)    HIP PAIN  . Starch Rash    IN LATEX GLOVES    Immunization History  Administered Date(s) Administered  . Influenza Split 05/27/2012, 06/02/2020  . Influenza, High Dose Seasonal PF 07/17/2015, 06/27/2016, 05/24/2017  . Influenza-Unspecified 05/24/2018  . Moderna SARS-COV2 Booster Vaccination 05/18/2020  . Moderna Sars-Covid-2 Vaccination 07/31/2019, 08/28/2019  . Pneumococcal Conjugate-13 05/24/2017  . Pneumococcal Polysaccharide-23 06/04/2018  . Tdap 11/30/2011  . Zoster Recombinat (Shingrix) 04/12/2017, 08/23/2017    Past Medical History:  Diagnosis Date  . 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 Barrett's esophagus 2009  . Benign head tremor   . Breast cancer, right breast ([facility]) 01/2014   "had 5 weeks radiation after lumpectomy" (04/24/2018)  . Dental bridge present    lower left  . Dental crowns present   . GERD (gastroesophageal reflux disease)   . History of angina   . History of DVT (deep vein thrombosis) ~ 1980   after a pregnancy  . History of radiation therapy 2015  . Hyperlipidemia   . Hypothyroidism   . Irregular heart beat    "dx'd by 1 dr; not noted by the cardiologist" (04/24/2018)  . NSAID-induced gastric ulcer   . Osteoporosis   . PONV (postoperative nausea and vomiting)     Tobacco History: Social History   Tobacco Use  Smoking Status Never Smoker  Smokeless Tobacco Never Used   Counseling given: Not Answered   Continue to not  smoke  Outpatient Encounter Medications as of 10/07/2020  Medication Sig  . levothyroxine (SYNTHROID) 88 MCG tablet Take 1 tablet (88 mcg total) by mouth daily.  . Multiple Vitamins-Minerals (MULTIVITAMIN ADULTS 50+ PO) Take 1 tablet by mouth daily.  Marland Kitchen omeprazole (PRILOSEC) 40 MG capsule Take 40 mg by mouth in the morning and at bedtime.  . tamoxifen (NOLVADEX) 20 MG tablet Take 1 tablet (20 mg total) by mouth daily.   No facility-administered encounter medications on file as of 10/07/2020.     Review of Systems  Review of Systems  No chest pain with exertion.  No orthopnea or PND.  No lower extremity swelling.  Comprehensive review of systems otherwise negative. Physical Exam  BP 118/72 (BP Location: Left Arm, Cuff Size: Normal)   Pulse 85   Temp 97.9 F (36.6 C) (Temporal)   Ht 5\' 7"  (1.702 m)   Wt 182 lb 6.4 oz (82.7 kg)   SpO2 98% Comment: Room air  BMI 28.57 kg/m  Wt Readings from Last 5 Encounters:  10/07/20 182 lb 6.4 oz (82.7 kg)  09/08/20 179 lb 12.8 oz (81.6 kg)  06/24/20 180 lb (81.6 kg)  06/15/20 180 lb (81.6 kg)  05/12/20 180 lb (81.6 kg)    BMI Readings from Last 5 Encounters:  10/07/20 28.57 kg/m  09/08/20 28.16 kg/m  06/24/20 28.19 kg/m  06/15/20 28.19 kg/m  05/12/20 28.19 kg/m     Physical Exam General: Well-appearing, no acute distress Eyes: EOMI, no icterus Neck: Supple, no JVP Cardiovascular: Regular rhythm, no murmurs Pulmonary: Clear on right, intermittent gastric noises heard at different times during inspiration and expiration but not consistently either throughout all lung fields, multiple breath without this noise, noises heard while patient holding breath as well Abdomen: Nondistended, bowel sounds present MSK: No synovitis, joint effusion Neuro: Normal gait, no weakness Psych: Normal mood, full affect   Assessment & Plan:   Abnormal lung sounds: Suspect most likely gastric noises as only heard on left, heard independent of  tidal and deep breathing.  On serial imaging at least dating back to last year she has left elevated hemidiaphragm.  Most recent chest x-ray 08/2020 with gastric bubble seen just below the left diaphragm.  Suspect this is transmitted sounds from stomach.  She denies significant dyspnea.  Chest pain: Sounds MSK in nature given positional, worse with deep breaths.  Has improved spontaneously without intervention.  Almost gone.  No further work-up.  If returns would pursue cross-sectional imaging, CT scan to evaluate this, further evaluate abnormal lung sounds as above which most likely related to gastric noises.  Wheeze: Not present exam.  Reported by patient.  Does have atopic symptoms.  Do not see any rationale for pursuing treatment for possible asthma.  She will contact us if symptoms worsen we can consider this in the future.  DOE: Likely related to elevated left hemidiaphragm.  Possible asthma given atopic symptoms.  Return in about 3 months (around 01/07/2021).   Lanier Clam, MD 10/07/2020

## 2020-10-07 NOTE — Patient Instructions (Signed)
Nice to meet you  The sounds I hear sound like gastric noises, air passing through the stomach which is probably more noticeable given your left diaphragm is elevated and you have evidence of stomach bubble or air  right underneath the diaphragm on recent chest x-ray imaging.  I am glad your chest pain is better.  If the chest pain comes back or your breathing becomes an issue, please let me know and we will pursue a CT scan for further evaluation.  Return to clinic in 3 months for follow-up with Dr. Silas Flood or sooner as needed

## 2020-10-15 ENCOUNTER — Other Ambulatory Visit: Payer: Self-pay | Admitting: Internal Medicine

## 2020-10-16 ENCOUNTER — Other Ambulatory Visit: Payer: Self-pay | Admitting: Internal Medicine

## 2020-11-24 ENCOUNTER — Other Ambulatory Visit: Payer: Self-pay | Admitting: Internal Medicine

## 2020-12-08 ENCOUNTER — Telehealth: Payer: Self-pay | Admitting: Pharmacist

## 2020-12-08 NOTE — Chronic Care Management (AMB) (Signed)
    Chronic Care Management Pharmacy Assistant   Name: Donna Manning  MRN: [id number] DOB: 1950-10-03  Reason for Encounter: Disease State/ General Assessment Call.    Conditions to be addressed/monitored: HLD and and Hypothyroidism.   Recent office visits:  None.   Recent consult visits:  10/07/20 Larey Days MD (Pulmonary Disease) - presented for initial consult per Dr. Dorris Carnes for dyspnea on exertion and other chronic conditions. No medication changes. Follow up in 3 months.   Hospital visits:  None in previous 6 months  Medications: Outpatient Encounter Medications as of 12/08/2020  Medication Sig  . levothyroxine (SYNTHROID) 88 MCG tablet TAKE 1 TABLET BY MOUTH EVERY DAY  . Multiple Vitamins-Minerals (MULTIVITAMIN ADULTS 50+ PO) Take 1 tablet by mouth daily.  Marland Kitchen omeprazole (PRILOSEC) 40 MG capsule Take 40 mg by mouth in the morning and at bedtime.  . tamoxifen (NOLVADEX) 20 MG tablet Take 1 tablet (20 mg total) by mouth daily.   No facility-administered encounter medications on file as of 12/08/2020.   Comprehensive medication review performed; Spoke to patient regarding cholesterol  Lipid Panel    Component Value Date/Time   CHOL 137 07/07/2020 1024   TRIG 124 07/07/2020 1024   HDL 58 07/07/2020 1024   LDLCALC 58 07/07/2020 1024   LDLDIRECT 147.2 11/23/2011 0912    10-year ASCVD risk score: The 10-year ASCVD risk score Mikey Bussing DC Brooke Bonito., et al., 2013) is: 8.2%   Values used to calculate the score:     Age: 70 years     Sex: Female     Is Non-Hispanic African American: No     Diabetic: No     Tobacco smoker: No     Systolic Blood Pressure: 967 mmHg     Is BP treated: No     HDL Cholesterol: 58 mg/dL     Total Cholesterol: 137 mg/dL  . Current antihyperlipidemic regimen:  o None.  . Previous antihyperlipidemic medications tried: None.  . ASCVD risk enhancing conditions: age >44 . What recent interventions/DTPs have been made by any provider to improve  Cholesterol control since last CPP Visit: None.  . Any recent hospitalizations or ED visits since last visit with CPP? No . What diet changes have been made to improve Cholesterol?  o Patient states she tried to eat food lower in cholesterol and get more vegetables and fresh fruits in her diet. Patient states sometimes she does not always make good choices if she has to grab something quickly or on the go. Patient states she does drink a lot of water though and no sodas usually.   . What exercise is being done to improve Cholesterol?  o Patient states she walks outside when the weather is nice and she does all housework on her own with no help. Patient states she stays active and has no issues getting around.   Adherence Review: Does the patient have >5 day gap between last estimated fill dates? No  Notes: Spoke with patient and went over all medications as listed. patient reports taking all medications as listed and no issues at this time. Patient thanked me for my call.   Star Rating Drugs:  None.   [place]  Clinical Pharmacist Assistant [phone]

## 2020-12-09 NOTE — Telephone Encounter (Cosign Needed)
2nd attempt

## 2020-12-10 DIAGNOSIS — Z23 Encounter for immunization: Secondary | ICD-10-CM | POA: Diagnosis not present

## 2020-12-16 ENCOUNTER — Other Ambulatory Visit: Payer: Self-pay

## 2020-12-16 ENCOUNTER — Ambulatory Visit
Admission: RE | Admit: 2020-12-16 | Discharge: 2020-12-16 | Disposition: A | Discharge status: Home / Self Care | Payer: Medicare Other | Source: Ambulatory Visit | Attending: Adult Health | Admitting: Adult Health

## 2020-12-16 DIAGNOSIS — M8588 Other specified disorders of bone density and structure, other site: Secondary | ICD-10-CM | POA: Diagnosis not present

## 2020-12-16 DIAGNOSIS — E2839 Other primary ovarian failure: Secondary | ICD-10-CM

## 2020-12-16 DIAGNOSIS — M810 Age-related osteoporosis without current pathological fracture: Secondary | ICD-10-CM | POA: Diagnosis not present

## 2020-12-16 DIAGNOSIS — Z780 Asymptomatic menopausal state: Secondary | ICD-10-CM | POA: Diagnosis not present

## 2020-12-17 ENCOUNTER — Other Ambulatory Visit: Payer: Self-pay

## 2020-12-17 ENCOUNTER — Telehealth: Payer: Self-pay | Admitting: Hematology and Oncology

## 2020-12-17 NOTE — Telephone Encounter (Signed)
I discussed with her that her bone density showed a T score of -2.5 which is in the osteoporosis range.  This is a slight improvement compared to 2015.  She is currently on tamoxifen and is exercising and takes calcium and vitamin D.  We discussed the risks and benefits of bisphosphonate therapy.  For she took Actonel and could not tolerate it.  Therefore she is reluctant to take any bisphosphonates.  Therefore we decided to watch and monitor with the bone density in 2 years.

## 2020-12-18 ENCOUNTER — Ambulatory Visit (INDEPENDENT_AMBULATORY_CARE_PROVIDER_SITE_OTHER): Payer: Medicare Other | Admitting: Family Medicine

## 2020-12-18 ENCOUNTER — Encounter: Payer: Self-pay | Admitting: Family Medicine

## 2020-12-18 VITALS — BP 120/70 | HR 87 | Temp 97.8°F | Wt 177.8 lb

## 2020-12-18 DIAGNOSIS — M25461 Effusion, right knee: Secondary | ICD-10-CM

## 2020-12-18 DIAGNOSIS — I2510 Atherosclerotic heart disease of native coronary artery without angina pectoris: Secondary | ICD-10-CM

## 2020-12-18 NOTE — Patient Instructions (Signed)
We are setting up sports medicine referral  May continue with intermittent icing and knee elastic compression sleeve.

## 2020-12-18 NOTE — Progress Notes (Signed)
Established Patient Office Visit  Subjective:  Patient ID: Donna Manning, female    DOB: [birth year]  Age: 70 y.o. MRN: [id number]  CC:  Chief Complaint  Patient presents with  . Knee Pain    R knee, x 4 weeks, pain radiates down the side of the leg, gives out when walking    HPI Donna Manning presents for right knee pain.  Onset about 4 weeks ago.  She was visiting her daughter and helping take care of her grandchildren and going up and down stairs frequently but denies any specific injury.  Her pain is right lateral knee.  Occasional radiation up toward the thigh region.  She had some mild swelling.  No ecchymosis.  No redness or warmth.  No prior history of knee problems.  She has history of Barrett's esophagus and avoids nonsteroidals.  She had 3 episodes over the past couple weeks where she was walking on flat ground and states that her knee basically "gave out".  She fell once.  No locking of the knee.  No low back pain.  Past Medical History:  Diagnosis Date  . 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 Barrett's esophagus 2009  . Benign head tremor   . Breast cancer, right breast ([place]) 01/2014   "had 5 weeks radiation after lumpectomy" (04/24/2018)  . Dental bridge present    lower left  . Dental crowns present   . GERD (gastroesophageal reflux disease)   . History of angina   . History of DVT (deep vein thrombosis) ~ 1980   after a pregnancy  . History of radiation therapy 2015  . Hyperlipidemia   . Hypothyroidism   . Irregular heart beat    "dx'd by 1 dr; not noted by the cardiologist" (04/24/2018)  . NSAID-induced gastric ulcer   . Osteoporosis   . PONV (postoperative nausea and vomiting)     Past Surgical History:  Procedure Laterality Date  . BREAST BIOPSY Right 1991  . BREAST EXCISIONAL BIOPSY Left 2019  . BREAST LUMPECTOMY Right 1991  . BREAST LUMPECTOMY W/ NEEDLE LOCALIZATION Left 04/24/2018  . BREAST LUMPECTOMY WITH NEEDLE  LOCALIZATION Left 04/24/2018   Procedure: LEFT BREAST LUMPECTOMY WITH NEEDLE LOCALIZATION;  Surgeon: [name], MD;  Location: [facility];  Service: General;  Laterality: Left;  . COLONOSCOPY     "w/hemorrhoid banding"  . COLOSTOMY  2009  . PARTIAL MASTECTOMY WITH NEEDLE LOCALIZATION AND AXILLARY SENTINEL LYMPH NODE BX Right 01/20/2014   Procedure: RIGHT PARTIAL MASTECTOMY WITH DOUBLE  NEEDLE LOCALIZATION (BRACKETED )AND AXILLARY SENTINEL LYMPH NODE BX;  Surgeon: [name], MD;  Location: [facility];  Service: General;  Laterality: Right;  And [place].  Marland Kitchen RE-EXCISION OF BREAST CANCER,SUPERIOR MARGINS Right 02/05/2014   Procedure: RIGHT LUMPECTOMY, RE-EXCISION OF BREAST CANCER,SUPERIOR MARGINS;  Surgeon: [name], MD;  Location: [facility];  Service: General;  Laterality: Right;  . UPPER GASTROINTESTINAL ENDOSCOPY  2016  . UPPER GI ENDOSCOPY  12/25/2014   with Propofol    Family History  Problem Relation Age of Onset  . Heart failure Mother 1  . Diabetes Mother   . Parkinsonism Father 75  . Stroke Father   . Thyroid disease Sister   . Prostate cancer Brother   . Stomach cancer Paternal Grandfather   . Colon polyps Neg Hx   . Crohn's disease Neg Hx   . Esophageal cancer Neg Hx   . Rectal cancer  Neg Hx   . Colon cancer Neg Hx     Social History   Socioeconomic History  . Marital status: Married    Spouse name: Not on file  . Number of children: 5  . Years of education: Not on file  . Highest education level: Not on file  Occupational History  . Occupation: homemaker  Tobacco Use  . Smoking status: Never Smoker  . Smokeless tobacco: Never Used  Vaping Use  . Vaping Use: Never used  Substance and Sexual Activity  . Alcohol use: Not Currently    Comment: 04/24/2018 "drank a few beers in college"  . Drug use: Never  . Sexual activity: Not Currently  Other Topics Concern  . Not on file  Social History Narrative   . Not on file   Social Determinants of Health   Financial Resource Strain: Low Risk   . Difficulty of Paying Living Expenses: Not hard at all  Food Insecurity: No Food Insecurity  . Worried About Charity fundraiser in the Last Year: Never true  . Ran Out of Food in the Last Year: Never true  Transportation Needs: No Transportation Needs  . Lack of Transportation (Medical): No  . Lack of Transportation (Non-Medical): No  Physical Activity: Sufficiently Active  . Days of Exercise per Week: 5 days  . Minutes of Exercise per Session: 30 min  Stress: No Stress Concern Present  . Feeling of Stress : Not at all  Social Connections: Moderately Integrated  . Frequency of Communication with Friends and Family: More than three times a week  . Frequency of Social Gatherings with Friends and Family: More than three times a week  . Attends Religious Services: Never  . Active Member of Clubs or Organizations: Yes  . Attends Archivist Meetings: 1 to 4 times per year  . Marital Status: Married  Human resources officer Violence: Not At Risk  . Fear of Current or Ex-Partner: No  . Emotionally Abused: No  . Physically Abused: No  . Sexually Abused: No    Outpatient Medications Prior to Visit  Medication Sig Dispense Refill  . levothyroxine (SYNTHROID) 88 MCG tablet TAKE 1 TABLET BY MOUTH EVERY DAY 90 tablet 0  . Multiple Vitamins-Minerals (MULTIVITAMIN ADULTS 50+ PO) Take 1 tablet by mouth daily.    Marland Kitchen omeprazole (PRILOSEC) 40 MG capsule Take 40 mg by mouth in the morning and at bedtime.    . tamoxifen (NOLVADEX) 20 MG tablet Take 1 tablet (20 mg total) by mouth daily. 90 tablet 3   No facility-administered medications prior to visit.    Allergies  Allergen Reactions  . Contrast Media [Iodinated Diagnostic Agents] Hives and Shortness Of Breath  . Actonel [Risedronate Sodium] Other (See Comments)    HIP PAIN  . Starch Rash    IN LATEX GLOVES    ROS Review of Systems   Musculoskeletal: Negative for back pain.  Neurological: Negative for numbness.      Objective:    Physical Exam Vitals reviewed.  Constitutional:      Appearance: Normal appearance.  Cardiovascular:     Rate and Rhythm: Normal rate and regular rhythm.  Pulmonary:     Effort: Pulmonary effort is normal.     Breath sounds: Normal breath sounds.  Musculoskeletal:     Comments: Right knee reveals small effusion.  May have some very mild warmth.  No erythema.  No ecchymosis.  No medial joint line tenderness.  She does have some mild joint  line tenderness on the left side.  Ligament testing is normal  Neurological:     Mental Status: She is alert.     BP 120/70 (BP Location: Left Arm, Patient Position: Sitting, Cuff Size: Normal)   Pulse 87   Temp 97.8 F (36.6 C) (Oral)   Wt 177 lb 12.8 oz (80.6 kg)   SpO2 97%   BMI 27.85 kg/m  Wt Readings from Last 3 Encounters:  12/18/20 177 lb 12.8 oz (80.6 kg)  10/07/20 182 lb 6.4 oz (82.7 kg)  09/08/20 179 lb 12.8 oz (81.6 kg)     Health Maintenance Due  Topic Date Due  . Pneumococcal Vaccine 85-31 Years old (1 of 4 - PCV13) Never done    There are no preventive care reminders to display for this patient.  Lab Results  Component Value Date   TSH 1.27 07/07/2020   Lab Results  Component Value Date   WBC 6.1 09/08/2020   HGB 12.5 09/08/2020   HCT 37.8 09/08/2020   MCV 93 09/08/2020   PLT 257 09/08/2020   Lab Results  Component Value Date   NA 142 09/08/2020   K 5.0 09/08/2020   CHLORIDE 108 09/08/2016   CO2 20 09/08/2020   GLUCOSE 75 09/08/2020   BUN 15 09/08/2020   CREATININE 0.84 09/08/2020   BILITOT 0.4 07/07/2020   ALKPHOS 31 (L) 06/17/2019   AST 19 07/07/2020   ALT 14 07/07/2020   PROT 6.6 07/07/2020   ALBUMIN 4.0 06/17/2019   CALCIUM 9.1 09/08/2020   ANIONGAP 10 04/25/2018   EGFR 77 (L) 09/08/2016   GFR 62.72 09/06/2019   Lab Results  Component Value Date   CHOL 137 07/07/2020   Lab Results   Component Value Date   HDL 58 07/07/2020   Lab Results  Component Value Date   LDLCALC 58 07/07/2020   Lab Results  Component Value Date   TRIG 124 07/07/2020   Lab Results  Component Value Date   CHOLHDL 2.4 07/07/2020   Lab Results  Component Value Date   HGBA1C 6.2 09/06/2019      Assessment & Plan:   Right knee effusion and pain of 4 weeks duration.  She has had a couple episodes where she states her leg "gave out ".  Question lateral meniscal injury.  She does have some very mild tenderness near the attachment of the iliotibial band distally to the fibula but this does not correlate with her main area of pain.  -Given duration, set up sports medicine referral -In the meantime she will focus on some icing and she has elastic knee sleeve that she can wear for some compression  No orders of the defined types were placed in this encounter.   Follow-up: No follow-ups on file.    [name], MD

## 2020-12-22 NOTE — Progress Notes (Signed)
I, Donna Manning, Donna Manning, Donna Manning acting as a scribe for Donna Leader, Donna Manning. Subjective:    I'm seeing this patient as a consultation for: Dr. [name]. Note will be routed back to referring provider/PCP.  CC: Right knee pain/swelling  HPI: Pt is a 70 y/o female c/o R knee pain ongoing for approximately 5 weeks w/ radiating pain down the lateral aspect of her lower leg and lateral aspect of thigh. MOI: Pt was visiting her daughter and helping take care of her grandchildren, going up/down stairs a lot, but do not recall a specific mechanism. Pt c/o knee "giving out" when she is walking and is afraid of suffering a fall. Pt locates pain to the anterior and lateral aspect of R knee. Pt is concerned bc she takes care of her infant grandson.  R knee swelling: yes Radiates: yes- into R lower leg and thigh Mechanical symptoms: no Weakness: yes Aggravates: up steps, walking Treatments tried: Tylenol  Past medical history, Surgical history, Family history, Social history, Allergies, and medications have been entered into the medical record, reviewed.  She takes care of her infant granddaughter but also her husband who has Alzheimer's.  Review of Systems: No new headache, visual changes, nausea, vomiting, diarrhea, constipation, dizziness, abdominal pain, skin rash, fevers, chills, night sweats, weight loss, swollen lymph nodes, body aches, joint swelling, muscle aches, chest pain, shortness of breath, mood changes, visual or auditory hallucinations.   Objective:    Vitals:   12/23/20 1244  BP: 118/76  Manning: 88  SpO2: 98%   General: Well Developed, well nourished, and in no acute distress.  Neuro/Psych: Alert and oriented x3, extra-ocular muscles intact, able to move all 4 extremities, sensation grossly intact. Skin: Warm and dry, no rashes noted.  Respiratory: Not using accessory muscles, speaking in full sentences, trachea midline.  Cardiovascular: Pulses palpable, no extremity  edema. Abdomen: Does not appear distended. MSK: Knee moderate effusion range of motion 5-100 degrees.  Motion with crepitation. Intact strength. Stable ligamentous exam. Guarding with McMurray's testing nondiagnostic.   Lab and Radiology Results  X-ray images right knee obtained today personally and independently interpreted Lateral compartment DJD.  Possible old avulsion fracture at fibular head. Await formal radiology review  Procedure: Real-time Ultrasound Guided Injection of right knee superior lateral patellar space Device: Philips Affiniti 50G Images permanently stored and available for review in PACS Ultrasound evaluation prior to injection reveals mild effusion.  Medial and lateral joint line no Baker's cyst. Verbal informed consent obtained.  Discussed risks and benefits of procedure. Warned about infection bleeding damage to structures skin hypopigmentation and fat atrophy among others. Patient expresses understanding and agreement Time-out conducted.   Noted no overlying erythema, induration, or other signs of local infection.   Skin prepped in a sterile fashion.   Local anesthesia: Topical Ethyl chloride.   With sterile technique and under real time ultrasound guidance:  40 mg of Kenalog and 2 mL of Marcaine injected into knee joint. Fluid seen entering the joint capsule.   Completed without difficulty   Pain immediately resolved suggesting accurate placement of the medication.   Advised to call if fevers/chills, erythema, induration, drainage, or persistent bleeding.   Images permanently stored and available for review in the ultrasound unit.  Impression: Technically successful ultrasound guided injection.       Impression and Recommendations:    Assessment and Plan: 70 y.o. 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  Plan for steroid injection today.  Also recommend Voltaren gel and continue  Tylenol.  Recheck in 6 weeks.  Discussed that ultimately it is likely that Donna Manning will require total knee replacement.  However currently she is caring for her husband has Alzheimer's and I think would be very logistically challenging for her to have a knee replacement at this time.Marland Kitchen  PDMP not reviewed this encounter. Orders Placed This Encounter  Procedures  . DG Knee AP/Donna Manning W/[facility] Right    Standing Status:   Future    Standing Expiration Date:   12/23/2021    Order Specific Question:   Reason for Exam (SYMPTOM  OR DIAGNOSIS REQUIRED)    Answer:   right knee pain    Order Specific Question:   Preferred imaging location?    Answer:   [facility]  . [place] LIMITED JOINT SPACE STRUCTURES LOW RIGHT(NO LINKED CHARGES)    Standing Status:   Future    Number of Occurrences:   1    Standing Expiration Date:   06/24/2021    Order Specific Question:   Reason for Exam (SYMPTOM  OR DIAGNOSIS REQUIRED)    Answer:   right knee pain    Order Specific Question:   Preferred imaging location?    Answer:   [facility]   No orders of the defined types were placed in this encounter.   Discussed warning signs or symptoms. Please see discharge instructions. Patient expresses understanding.   The above documentation has been reviewed and is accurate and complete Donna Manning, M.D.

## 2020-12-23 ENCOUNTER — Other Ambulatory Visit: Payer: Self-pay

## 2020-12-23 ENCOUNTER — Ambulatory Visit: Payer: Self-pay

## 2020-12-23 ENCOUNTER — Ambulatory Visit (INDEPENDENT_AMBULATORY_CARE_PROVIDER_SITE_OTHER): Payer: Medicare Other

## 2020-12-23 ENCOUNTER — Ambulatory Visit (INDEPENDENT_AMBULATORY_CARE_PROVIDER_SITE_OTHER): Payer: Medicare Other | Admitting: Family Medicine

## 2020-12-23 VITALS — BP 118/76 | HR 88 | Ht 67.0 in | Wt 177.8 lb

## 2020-12-23 DIAGNOSIS — M25561 Pain in right knee: Secondary | ICD-10-CM

## 2020-12-23 DIAGNOSIS — I2510 Atherosclerotic heart disease of native coronary artery without angina pectoris: Secondary | ICD-10-CM

## 2020-12-23 NOTE — Patient Instructions (Addendum)
Thank you for coming in today.  Please get an Xray today before you leave  Please use Voltaren gel (Generic Diclofenac Gel) up to 4x daily for pain as needed.  This is available over-the-counter as both the name brand Voltaren gel and the generic diclofenac gel.  Recheck in 6 weeks.

## 2020-12-28 NOTE — Progress Notes (Signed)
Pt viewed Dr. Corey's result note via MyChart.  

## 2020-12-28 NOTE — Progress Notes (Signed)
Right knee x-ray shows mild arthritis.

## 2021-01-26 ENCOUNTER — Ambulatory Visit (INDEPENDENT_AMBULATORY_CARE_PROVIDER_SITE_OTHER): Payer: Medicare Other | Admitting: Adult Health

## 2021-01-26 ENCOUNTER — Other Ambulatory Visit: Payer: Self-pay

## 2021-01-26 ENCOUNTER — Encounter: Payer: Self-pay | Admitting: Adult Health

## 2021-01-26 VITALS — BP 116/74 | HR 97 | Temp 96.6°F | Wt 173.2 lb

## 2021-01-26 DIAGNOSIS — H1013 Acute atopic conjunctivitis, bilateral: Secondary | ICD-10-CM | POA: Diagnosis not present

## 2021-01-26 DIAGNOSIS — I2510 Atherosclerotic heart disease of native coronary artery without angina pectoris: Secondary | ICD-10-CM

## 2021-01-26 MED ORDER — OLOPATADINE HCL 0.2 % OP SOLN: 1.0000 [drp] | Freq: Two times a day (BID) | OPHTHALMIC | 0 refills | Status: DC

## 2021-01-26 MED ORDER — METHYLPREDNISOLONE ACETATE 80 MG/ML IJ SUSP
80.0000 mg | Freq: Once | INTRAMUSCULAR | Status: AC
  Administered 2021-01-26: 80 mg via INTRAMUSCULAR

## 2021-01-26 NOTE — Patient Instructions (Signed)
It was great meeting you today  Hopefully we can get you feeling better.   We gave you a steroid injection today and I have sent in some eye drops to use if needed  Please let us know if these symptoms do not improve.

## 2021-01-26 NOTE — Progress Notes (Signed)
Subjective:    Patient ID: Donna Manning, female    DOB: 1950-12-29, 70 y.o.   MRN: [id number]  HPI 70 year old female who  has a past medical history of Atypical lobular hyperplasia ([place]) of left breast (03/2018), Barrett's esophagus (2009), Benign head tremor, Breast cancer, right breast ([place]) (01/2014), Dental bridge present, Dental crowns present, GERD (gastroesophageal reflux disease), History of angina, History of DVT (deep vein thrombosis) (~ 1980), History of radiation therapy (2015), Hyperlipidemia, Hypothyroidism, Irregular heart beat, NSAID-induced gastric ulcer, Osteoporosis, and PONV (postoperative nausea and vomiting).  She presents to the office today for itchy eyes causes by seasonal allergies. She has had this in the past, but it has been a few years. She has been using OTC eye drops, benadryl, and cold compresses without relief. Symptoms have been present for the last 3-4 days.   Does not endorse matting, eye pain, or acute blurred vision. Has not had any drainage   Review of Systems See HPI   Past Medical History:  Diagnosis Date   Atypical lobular hyperplasia ([facility]) of left breast 03/2018   Barrett's esophagus 2009   Benign head tremor    Breast cancer, right breast ([place]) 01/2014   "had 5 weeks radiation after lumpectomy" (04/24/2018)   Dental bridge present    lower left   Dental crowns present    GERD (gastroesophageal reflux disease)    History of angina    History of DVT (deep vein thrombosis) ~ 1980   after a pregnancy   History of radiation therapy 2015   Hyperlipidemia    Hypothyroidism    Irregular heart beat    "dx'd by 1 dr; not noted by the cardiologist" (04/24/2018)   NSAID-induced gastric ulcer    Osteoporosis    PONV (postoperative nausea and vomiting)     Social History   Socioeconomic History   Marital status: Married    Spouse name: Not on file   Number of children: 5   Years of education: Not on file   Highest education level: Not  on file  Occupational History   Occupation: homemaker  Tobacco Use   Smoking status: Never   Smokeless tobacco: Never  Vaping Use   Vaping Use: Never used  Substance and Sexual Activity   Alcohol use: Not Currently    Comment: 04/24/2018 "drank a few beers in college"   Drug use: Never   Sexual activity: Not Currently  Other Topics Concern   Not on file  Social History Narrative   Not on file   Social Determinants of Health   Financial Resource Strain: Low Risk    Difficulty of Paying Living Expenses: Not hard at all  Food Insecurity: No Food Insecurity   Worried About Charity fundraiser in the Last Year: Never true   Ran Out of Food in the Last Year: Never true  Transportation Needs: No Transportation Needs   Lack of Transportation (Medical): No   Lack of Transportation (Non-Medical): No  Physical Activity: Sufficiently Active   Days of Exercise per Week: 5 days   Minutes of Exercise per Session: 30 min  Stress: No Stress Concern Present   Feeling of Stress : Not at all  Social Connections: Moderately Integrated   Frequency of Communication with Friends and Family: More than three times a week   Frequency of Social Gatherings with Friends and Family: More than three times a week   Attends Religious Services: Never   Active Member of [employer]  or Organizations: Yes   Attends Archivist Meetings: 1 to 4 times per year   Marital Status: Married  Human resources officer Violence: Not At Risk   Fear of Current or Ex-Partner: No   Emotionally Abused: No   Physically Abused: No   Sexually Abused: No    Past Surgical History:  Procedure Laterality Date   BREAST BIOPSY Right 1991   BREAST EXCISIONAL BIOPSY Left 2019   BREAST LUMPECTOMY Right 1991   BREAST LUMPECTOMY W/ NEEDLE LOCALIZATION Left 04/24/2018   BREAST LUMPECTOMY WITH NEEDLE LOCALIZATION Left 04/24/2018   Procedure: LEFT BREAST LUMPECTOMY WITH NEEDLE LOCALIZATION;  Surgeon: [name], MD;  Location: [facility];  Service: General;  Laterality: Left;   COLONOSCOPY     "w/hemorrhoid banding"   COLOSTOMY  2009   PARTIAL MASTECTOMY WITH NEEDLE LOCALIZATION AND AXILLARY SENTINEL LYMPH NODE BX Right 01/20/2014   Procedure: RIGHT PARTIAL MASTECTOMY WITH DOUBLE  NEEDLE LOCALIZATION (BRACKETED )AND AXILLARY SENTINEL LYMPH NODE BX;  Surgeon: [name], MD;  Location: [facility];  Service: General;  Laterality: Right;  And [place].   RE-EXCISION OF BREAST CANCER,SUPERIOR MARGINS Right 02/05/2014   Procedure: RIGHT LUMPECTOMY, RE-EXCISION OF BREAST CANCER,SUPERIOR MARGINS;  Surgeon: [name], MD;  Location: [facility];  Service: General;  Laterality: Right;   UPPER GASTROINTESTINAL ENDOSCOPY  2016   UPPER GI ENDOSCOPY  12/25/2014   with Propofol    Family History  Problem Relation Age of Onset   Heart failure Mother 32   Diabetes Mother    Parkinsonism Father 81   Stroke Father    Thyroid disease Sister    Prostate cancer Brother    Stomach cancer Paternal Grandfather    Colon polyps Neg Hx    Crohn's disease Neg Hx    Esophageal cancer Neg Hx    Rectal cancer Neg Hx    Colon cancer Neg Hx     Allergies  Allergen Reactions   Contrast Media [Iodinated Diagnostic Agents] Hives and Shortness Of Breath   Actonel [Risedronate Sodium] Other (See Comments)    HIP PAIN   Starch Rash    IN LATEX GLOVES    Current Outpatient Medications on File Prior to Visit  Medication Sig Dispense Refill   levothyroxine (SYNTHROID) 88 MCG tablet TAKE 1 TABLET BY MOUTH EVERY DAY 90 tablet 0   Multiple Vitamins-Minerals (MULTIVITAMIN ADULTS 50+ PO) Take 1 tablet by mouth daily.     omeprazole (PRILOSEC) 40 MG capsule Take 40 mg by mouth in the morning and at bedtime.     tamoxifen (NOLVADEX) 20 MG tablet Take 1 tablet (20 mg total) by mouth daily. 90 tablet 3   No current facility-administered medications on file prior to visit.    BP 116/74 (BP  Location: Left Arm, Patient Position: Sitting, Cuff Size: Normal)   Pulse 97   Temp (!) 96.6 F (35.9 C) (Temporal)   Wt 173 lb 3.2 oz (78.6 kg)   SpO2 97%   BMI 27.13 kg/m       Objective:   Physical Exam Vitals and nursing note reviewed.  Constitutional:      Appearance: Normal appearance.  Eyes:     General: Lids are normal. Lids are everted, no foreign bodies appreciated.        Right eye: No foreign body or discharge.        Left eye: Discharge (watery) present.No foreign body.     Extraocular Movements:  Right eye: Normal extraocular motion.     Left eye: Normal extraocular motion.     Conjunctiva/sclera: Conjunctivae normal.     Right eye: Right conjunctiva is not injected.     Left eye: Left conjunctiva is not injected.  Neurological:     Mental Status: She is alert.      Assessment & Plan:  1. Allergic conjunctivitis of both eyes  - Olopatadine HCl (PATADAY) 0.2 % SOLN; Apply 1 drop to eye in the morning and at bedtime.  Dispense: 2.5 mL; Refill: 0 - methylPREDNISolone acetate (DEPO-MEDROL) injection 80 mg - Follow up as needed  [name], NP

## 2021-02-01 ENCOUNTER — Ambulatory Visit: Payer: Medicare Other | Admitting: Family Medicine

## 2021-02-03 ENCOUNTER — Ambulatory Visit (INDEPENDENT_AMBULATORY_CARE_PROVIDER_SITE_OTHER): Payer: Medicare Other | Admitting: Family Medicine

## 2021-02-03 ENCOUNTER — Other Ambulatory Visit: Payer: Self-pay

## 2021-02-03 VITALS — BP 118/78 | HR 80 | Ht 67.0 in | Wt 174.0 lb

## 2021-02-03 DIAGNOSIS — M25561 Pain in right knee: Secondary | ICD-10-CM | POA: Diagnosis not present

## 2021-02-03 DIAGNOSIS — I2510 Atherosclerotic heart disease of native coronary artery without angina pectoris: Secondary | ICD-10-CM | POA: Diagnosis not present

## 2021-02-03 NOTE — Patient Instructions (Addendum)
Thank you for coming in today.   I will get the gel shots authorized.   If you knee hurts a lot after the beach let me know.   Ok to continue the Voltaren especially if you think it helps.   I can do the cortisone shot as soon at Sep 8th ish.

## 2021-02-03 NOTE — Progress Notes (Signed)
   I, Peterson Lombard, LAT, ATC acting as a scribe for [name], MD.  Donna Manning is a 70 y.o. female who presents to [place] at [facility] today for f/u R knee pain. MOI: Pt was visiting her daughter and helping take care of her grandchildren, going up/down stairs a lot, but did not recall a specific mechanism.Pt was last seen by Dr. Georgina Snell on 12/23/20 and was given a R knee steroid injection and advised to use Voltaren gel and Tylenol. Today, pt reports R knee is feeling a lot better, 80% improvement.  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   Exam:  BP 118/78 (BP Location: Right Arm, Patient Position: Sitting, Cuff Size: Normal)   Manning 79   Ht 5\' 7"  (1.702 m)   Wt 174 lb (78.9 kg)   SpO2 95%   BMI 27.25 kg/m  General: Well Developed, well nourished, and in no acute distress.   MSK: Right knee normal motion with crepitation.  Normal gait.    Lab and Radiology Results  EXAM: RIGHT KNEE 3 VIEWS   COMPARISON:  None.   FINDINGS: Mild tricompartmental degenerative changes. No fractures or dislocations. No bony lesions. No joint effusion. Enthesopathic changes associated with the superior patella.   IMPRESSION: Mild tricompartmental degenerative changes. Enthesopathic changes associated with the superior patella.     Electronically Signed   By: Dorise Bullion III M.D   On: 12/25/2020 21:00   I, [name], personally (independently) visualized and performed the interpretation of the images attached in this note.     Assessment and Plan: 70 y.o. 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  Patient had good benefit with steroid injection but not complete benefit.  We will go ahead and authorize hyaluronic acid injections now.  If the pain returns or the injections do  not last long enough certainly that is an option.  She is eligible for repeat steroid injection around September 8.  Recheck as needed.   Additionally Donna Manning mentioned that she is having a lot of stress with her husband who has dementia and is having some behavioral disturbances and seeing some challenging or at times hateful things.  She wonders if she could benefit from counseling.  I said this is probably a good idea and will mention this to her primary care provider.  Discussed warning signs or symptoms. Please see discharge instructions. Patient expresses understanding.   The above documentation has been reviewed and is accurate and complete [name], M.D. Total encounter time 20 minutes including face-to-face time with the patient and, reviewing past medical record, and charting on the date of service.   Treatment plan and options.

## 2021-02-10 ENCOUNTER — Telehealth: Payer: Medicare Other

## 2021-02-12 ENCOUNTER — Telehealth: Payer: Medicare Other

## 2021-02-20 ENCOUNTER — Other Ambulatory Visit: Payer: Self-pay | Admitting: Internal Medicine

## 2021-02-26 ENCOUNTER — Telehealth: Payer: Self-pay | Admitting: Pharmacist

## 2021-02-26 NOTE — Chronic Care Management (AMB) (Signed)
    Chronic Care Management Pharmacy Assistant   Name: Donna Manning  MRN: [id number] DOB: [birth year]  Reason for Encounter: Disease State/ General Assessment Call.   Conditions to be addressed/monitored: HLD and Hypothyroidism.   Recent office visits:  01/26/21 Donna Peng NP (PCP) - seen for allergic conjunctivitis of both eyes. Patient given methylprednisolone injection '80mg'$  in office. Patient started on olopatadine 0.2% two times daily in both eyes. Follow up as needed.   12/18/20 Donna Littler MD (PCP) - seen for effusion of right knee. Referral placed for sports medicine. No medication changes. No follow up noted.   Recent consult visits:  02/03/21 Donna Leader MD (Sports Medicine) - seen for right anterior knee pain. No medication changes. No follow up noted.   12/23/20 Donna Leader MD (Sports Medicine) - seen for right anterior knee pain. No medication changes. No follow up noted.    Hospital visits:  None in previous 6 months  Medications: Outpatient Encounter Medications as of 02/26/2021  Medication Sig   levothyroxine (SYNTHROID) 88 MCG tablet TAKE 1 TABLET BY MOUTH EVERY DAY   Multiple Vitamins-Minerals (MULTIVITAMIN ADULTS 50+ PO) Take 1 tablet by mouth daily.   Olopatadine HCl (PATADAY) 0.2 % SOLN Apply 1 drop to eye in the morning and at bedtime.   omeprazole (PRILOSEC) 40 MG capsule Take 40 mg by mouth in the morning and at bedtime.   tamoxifen (NOLVADEX) 20 MG tablet Take 1 tablet (20 mg total) by mouth daily.   No facility-administered encounter medications on file as of 02/26/2021.   Fill History: LEVOTHYROXINE 88 MCG TABLET 02/22/2021 30   OMEPRAZOLE DR '40MG'$  CAP 11/27/2020 90   TAMOXIFEN 20 MG TABLET 01/11/2021 90   02/26/2021 Name: Donna Manning MRN: [id number] DOB: [birth year] Donna Manning is a 70 y.o. year old female who is a primary care patient of [name], [name], MD.  Comprehensive medication review performed; Spoke to patient regarding  cholesterol  Lipid Panel    Component Value Date/Time   CHOL 137 07/07/2020 1024   TRIG 124 07/07/2020 1024   HDL 58 07/07/2020 1024   LDLCALC 58 07/07/2020 1024   LDLDIRECT 147.2 11/23/2011 0912    10-year ASCVD risk score: The 10-year ASCVD risk score Mikey Bussing DC Brooke Bonito., et al., 2013) is: 8.2%   Values used to calculate the score:     Age: 70 years     Sex: Female     Is Non-Hispanic African American: No     Diabetic: No     Tobacco smoker: No     Systolic Blood Pressure: 123456 mmHg     Is BP treated: No     HDL Cholesterol: 58 mg/dL     Total Cholesterol: 137 mg/dL  Current antihyperlipidemic regimen:  None. Previous antihyperlipidemic medications tried: Rosuvastatin  ASCVD risk enhancing conditions: age >44 What recent interventions/DTPs have been made by any provider to improve Cholesterol control since last CPP Visit: None. Any recent hospitalizations or ED visits since last visit with CPP? No What diet changes have been made to improve Cholesterol?   What exercise is being done to improve Cholesterol?    Adherence Review: Does the patient have >5 day gap between last estimated fill dates? No  Multiple unsuccessful attempts to reach patient by phone.   Care Gaps:  AWV - scheduled for 04/20/21 Influenza vaccine -  due  Star Rating Drugs:  None.  [place]  Clinical Pharmacist Assistant [phone]

## 2021-03-02 NOTE — Telephone Encounter (Cosign Needed)
2nd attempt

## 2021-03-04 NOTE — Telephone Encounter (Cosign Needed)
3rd attempt

## 2021-03-17 ENCOUNTER — Other Ambulatory Visit: Payer: Self-pay | Admitting: Internal Medicine

## 2021-04-06 ENCOUNTER — Other Ambulatory Visit: Payer: Self-pay | Admitting: Internal Medicine

## 2021-04-06 DIAGNOSIS — Z1231 Encounter for screening mammogram for malignant neoplasm of breast: Secondary | ICD-10-CM

## 2021-04-09 ENCOUNTER — Telehealth: Payer: Self-pay | Admitting: Pharmacist

## 2021-04-09 NOTE — Chronic Care Management (AMB) (Signed)
    Chronic Care Management Pharmacy Assistant   Name: Donna Manning  MRN: [id number] DOB: 12/16/1950   Reason for Encounter: Disease State/ General Assessment Call.   Conditions to be addressed/monitored: HLD and Hypothyroidism   Recent office visits:  01/26/21 [name] NP (PCP) - seen for allergic conjunctivitis of both eyes. Patient given methylprednisolone injection 80mg  in office. Patient started on olopatadine 0.2% two times daily in both eyes. Follow up as needed.    12/18/20 [name] MD (PCP) - seen for effusion of right knee. Referral placed for sports medicine. No medication changes. No follow up noted.     Recent consult visits:  02/03/21 [name] MD (Sports Medicine) - seen for right anterior knee pain. No medication changes. No follow up noted.    12/23/20 [name] MD (Sports Medicine) - seen for right anterior knee pain. No medication changes. No follow up noted.   Hospital visits:  None in previous 6 months  Medications: Outpatient Encounter Medications as of 04/09/2021  Medication Sig   levothyroxine (SYNTHROID) 88 MCG tablet TAKE 1 TABLET BY MOUTH EVERY DAY   Multiple Vitamins-Minerals (MULTIVITAMIN ADULTS 50+ PO) Take 1 tablet by mouth daily.   Olopatadine HCl (PATADAY) 0.2 % SOLN Apply 1 drop to eye in the morning and at bedtime.   omeprazole (PRILOSEC) 40 MG capsule Take 40 mg by mouth in the morning and at bedtime.   tamoxifen (NOLVADEX) 20 MG tablet Take 1 tablet (20 mg total) by mouth daily.   No facility-administered encounter medications on file as of 04/09/2021.   Fill History: LEVOTHYROXINE 88 MCG TABLET 03/17/2021 30   OMEPRAZOLE DR 40MG  CAP 11/27/2020 90   TAMOXIFEN 20 MG TABLET 01/11/2021 90   Comprehensive medication review performed; Spoke to patient regarding cholesterol  Lipid Panel    Component Value Date/Time   CHOL 137 07/07/2020 1024   TRIG 124 07/07/2020 1024   HDL 58 07/07/2020 1024   LDLCALC 58 07/07/2020 1024    LDLDIRECT 147.2 11/23/2011 0912    10-year ASCVD risk score: The 10-year ASCVD risk score (Arnett DK, et al., 2019) is: 8.2%   Values used to calculate the score:     Age: 89 years     Sex: Female     Is Non-Hispanic African American: No     Diabetic: No     Tobacco smoker: No     Systolic Blood Pressure: 193 mmHg     Is BP treated: No     HDL Cholesterol: 58 mg/dL     Total Cholesterol: 137 mg/dL  Current antihyperlipidemic regimen:  None. Previous antihyperlipidemic medications tried: Rosuvastatin  ASCVD risk enhancing conditions: age >44 What recent interventions/DTPs have been made by any provider to improve Cholesterol control since last CPP Visit: None. Any recent hospitalizations or ED visits since last visit with CPP? No  Adherence Review: Does the patient have >5 day gap between last estimated fill dates? No  Unsuccessful at reaching patient.   Care Gaps:  AWV - scheduled for 04/20/21 Influenza vaccine -  due    Star Rating Drugs:  None.  [place]  Clinical Pharmacist Assistant [phone]

## 2021-04-13 NOTE — Telephone Encounter (Cosign Needed)
2nd attempt

## 2021-04-15 ENCOUNTER — Other Ambulatory Visit: Payer: Self-pay | Admitting: Internal Medicine

## 2021-04-20 ENCOUNTER — Other Ambulatory Visit: Payer: Self-pay

## 2021-04-20 ENCOUNTER — Ambulatory Visit (INDEPENDENT_AMBULATORY_CARE_PROVIDER_SITE_OTHER): Payer: Medicare Other

## 2021-04-20 VITALS — BP 98/62 | HR 87 | Temp 98.5°F | Wt 176.5 lb

## 2021-04-20 DIAGNOSIS — Z0000 Encounter for general adult medical examination without abnormal findings: Secondary | ICD-10-CM

## 2021-04-20 NOTE — Progress Notes (Addendum)
Subjective:   Donna Manning is a 70 y.o. female who presents for Medicare Annual (Subsequent) preventive examination.  Review of Systems           Objective:    Today's Vitals   04/20/21 1438  BP: 98/62  Pulse: 87  Temp: 98.5 F (36.9 C)  SpO2: 97%  Weight: 176 lb 8 oz (80.1 kg)   Body mass index is 27.64 kg/m.  Advanced Directives 04/20/2021 04/15/2020 04/24/2018 04/17/2018 09/08/2016 09/08/2015 01/30/2015  Does Patient Have a Medical Advance Directive? Yes No No No No Yes No  Type of Advance Directive [place] in Chart? No - copy requested - - - - No - copy requested -  Would patient like information on creating a medical advance directive? - No - Patient declined No - Patient declined No - Patient declined - - No - patient declined information    Current Medications (verified) Outpatient Encounter Medications as of 04/20/2021  Medication Sig   levothyroxine (SYNTHROID) 88 MCG tablet TAKE 1 TABLET BY MOUTH EVERY DAY   Multiple Vitamins-Minerals (MULTIVITAMIN ADULTS 50+ PO) Take 1 tablet by mouth daily.   omeprazole (PRILOSEC) 40 MG capsule Take 40 mg by mouth in the morning and at bedtime.   tamoxifen (NOLVADEX) 20 MG tablet Take 1 tablet (20 mg total) by mouth daily.   [DISCONTINUED] Olopatadine HCl (PATADAY) 0.2 % SOLN Apply 1 drop to eye in the morning and at bedtime. (Patient not taking: Reported on 04/20/2021)   No facility-administered encounter medications on file as of 04/20/2021.    Allergies (verified) Contrast media [iodinated diagnostic agents], Actonel [risedronate sodium], and Starch   History: Past Medical History:  Diagnosis Date   Atypical lobular hyperplasia (ALH) of left breast 03/2018   Barrett's esophagus 2009   Benign head tremor    Breast cancer, right breast ([place]) 01/2014   "had 5 weeks radiation after lumpectomy" (04/24/2018)   Dental bridge  present    lower left   Dental crowns present    GERD (gastroesophageal reflux disease)    History of angina    History of DVT (deep vein thrombosis) ~ 1980   after a pregnancy   History of radiation therapy 2015   Hyperlipidemia    Hypothyroidism    Irregular heart beat    "dx'd by 1 dr; not noted by the cardiologist" (04/24/2018)   NSAID-induced gastric ulcer    Osteoporosis    PONV (postoperative nausea and vomiting)    Past Surgical History:  Procedure Laterality Date   BREAST BIOPSY Right 1991   BREAST EXCISIONAL BIOPSY Left 2019   BREAST LUMPECTOMY Right 1991   BREAST LUMPECTOMY W/ NEEDLE LOCALIZATION Left 04/24/2018   BREAST LUMPECTOMY WITH NEEDLE LOCALIZATION Left 04/24/2018   Procedure: LEFT BREAST LUMPECTOMY WITH NEEDLE LOCALIZATION;  Surgeon: [name], MD;  Location: [facility];  Service: General;  Laterality: Left;   COLONOSCOPY     "w/hemorrhoid banding"   COLOSTOMY  2009   PARTIAL MASTECTOMY WITH NEEDLE LOCALIZATION AND AXILLARY SENTINEL LYMPH NODE BX Right 01/20/2014   Procedure: RIGHT PARTIAL MASTECTOMY WITH DOUBLE  NEEDLE LOCALIZATION (BRACKETED )AND AXILLARY SENTINEL LYMPH NODE BX;  Surgeon: [name], MD;  Location: [facility];  Service: General;  Laterality: Right;  And [place].   RE-EXCISION OF BREAST CANCER,SUPERIOR MARGINS Right 02/05/2014   Procedure: RIGHT LUMPECTOMY, RE-EXCISION OF BREAST  CANCER,SUPERIOR MARGINS;  Surgeon: [name], MD;  Location: [facility];  Service: General;  Laterality: Right;   UPPER GASTROINTESTINAL ENDOSCOPY  2016   UPPER GI ENDOSCOPY  12/25/2014   with Propofol   Family History  Problem Relation Age of Onset   Heart failure Mother 29   Diabetes Mother    Parkinsonism Father 86   Stroke Father    Thyroid disease Sister    Prostate cancer Brother    Stomach cancer Paternal Grandfather    Colon polyps Neg Hx    Crohn's disease Neg Hx    Esophageal cancer Neg Hx     Rectal cancer Neg Hx    Colon cancer Neg Hx    Social History   Socioeconomic History   Marital status: Married    Spouse name: Not on file   Number of children: 5   Years of education: Not on file   Highest education level: Not on file  Occupational History   Occupation: homemaker  Tobacco Use   Smoking status: Never   Smokeless tobacco: Never  Vaping Use   Vaping Use: Never used  Substance and Sexual Activity   Alcohol use: Not Currently    Comment: 04/24/2018 "drank a few beers in college"   Drug use: Never   Sexual activity: Not Currently  Other Topics Concern   Not on file  Social History Narrative   Not on file   Social Determinants of Health   Financial Resource Strain: Low Risk    Difficulty of Paying Living Expenses: Not hard at all  Food Insecurity: No Food Insecurity   Worried About Charity fundraiser in the Last Year: Never true   [place] in the Last Year: Never true  Transportation Needs: No Transportation Needs   Lack of Transportation (Medical): No   Lack of Transportation (Non-Medical): No  Physical Activity: Insufficiently Active   Days of Exercise per Week: 5 days   Minutes of Exercise per Session: 20 min  Stress: No Stress Concern Present   Feeling of Stress : Not at all  Social Connections: Socially Integrated   Frequency of Communication with Friends and Family: More than three times a week   Frequency of Social Gatherings with Friends and Family: More than three times a week   Attends Religious Services: 1 to 4 times per year   Active Member of [employer] or Organizations: Yes   Attends Archivist Meetings: 1 to 4 times per year   Marital Status: Married    Tobacco Counseling Counseling given: Not Answered   Clinical Intake:  Pre-visit preparation completed: Yes  Pain : No/denies pain     BMI - recorded: 27.64 Nutritional Status: BMI 25 -29 Overweight Nutritional Risks: None Diabetes: No  How often do you need  to have someone help you when you read instructions, pamphlets, or other written materials from your doctor or pharmacy?: 1 - Never  Diabetic?NO  Interpreter Needed?: No  Information entered by :: Charlott Rakes, LPN   Activities of Daily Living In your present state of health, do you have any difficulty performing the following activities: 04/20/2021  Hearing? N  Vision? N  Difficulty concentrating or making decisions? N  Walking or climbing stairs? N  Dressing or bathing? N  Doing errands, shopping? N  Preparing Food and eating ? N  Using the Toilet? N  In the past six months, have you accidently leaked urine? Y  Comment poise pads  Do you have problems with loss of bowel control? N  Managing your Medications? N  Managing your Finances? N  Housekeeping or managing your Housekeeping? N  Some recent data might be hidden    Patient Care Team: [name], [name], MD as PCP - General Fay Records, MD as PCP - Cardiology (Cardiology) [name], MD as Attending Physician (Obstetrics and Gynecology) Sharyne Peach, MD (Ophthalmology) Fanny Skates, MD as Consulting Physician (General Surgery) Thea Silversmith, MD as Consulting Physician (Radiation Oncology) Mauri Pole, MD as Consulting Physician (Gastroenterology) Nicholas Lose, MD as Consulting Physician (Hematology and Oncology) Viona Gilmore, [facility] as Pharmacist (Pharmacist)  Indicate any recent Medical Services you may have received from other than [facility] providers in the past year (date may be approximate).     Assessment:   This is a routine wellness examination for [place].  Hearing/Vision screen Hearing Screening - Comments:: Pt denies any hearing issues  Vision Screening - Comments:: Pt follows up with Dr Delman Cheadle for annual eye exams   Dietary issues and exercise activities discussed: Current Exercise Habits: Home exercise routine, Type of exercise: walking, Time (Minutes): 30, Frequency (Times/Week):  5, Weekly Exercise (Minutes/Week): 150   Goals Addressed             This Visit's Progress    Patient Stated       Stay healthy       Depression Screen PHQ 2/9 Scores 04/20/2021 04/15/2020 08/05/2019 06/04/2018 05/24/2017 11/28/2016 06/24/2015  PHQ - 2 Score 0 0 0 0 0 0 0    Fall Risk Fall Risk  04/20/2021 08/05/2019 06/07/2018 06/04/2018 05/24/2017  Falls in the past year? 0 0 0 0 No  Comment - - - - -  Number falls in past yr: 0 0 0 - -  Injury with Fall? 0 0 0 - -  Risk for fall due to : Impaired vision - History of fall(s) - -  Follow up Falls prevention discussed Falls evaluation completed Falls evaluation completed - -    FALL RISK PREVENTION PERTAINING TO THE HOME:  Any stairs in or around the home? Yes  If so, are there any without handrails? No  Home free of loose throw rugs in walkways, pet beds, electrical cords, etc? Yes  Adequate lighting in your home to reduce risk of falls? Yes   ASSISTIVE DEVICES UTILIZED TO PREVENT FALLS:  Life alert? No  Use of a cane, walker or w/c? No  Grab bars in the bathroom? Yes  Shower chair or bench in shower? Yes  Elevated toilet seat or a handicapped toilet? Yes   TIMED UP AND GO:  Was the test performed? Yes .  Length of time to ambulate 10 feet: 10 sec.   Gait steady and fast without use of assistive device  Cognitive Function:     6CIT Screen 04/20/2021  What Year? 0 points  What month? 0 points  What time? 0 points  Count back from 20 0 points  Months in reverse 0 points  Repeat phrase 0 points  Total Score 0    Immunizations Immunization History  Administered Date(s) Administered   Influenza Split 05/27/2012, 06/02/2020   Influenza, High Dose Seasonal PF 07/17/2015, 06/27/2016, 05/24/2017   Influenza-Unspecified 05/24/2018   Moderna SARS-COV2 Booster Vaccination 05/18/2020, 12/10/2020   Moderna Sars-Covid-2 Vaccination 07/31/2019, 08/28/2019, 12/10/2020   Pneumococcal Conjugate-13 05/24/2017    Pneumococcal Polysaccharide-23 06/04/2018   Tdap 11/30/2011   Zoster Recombinat (Shingrix) 04/12/2017, 08/23/2017    TDAP status:  Up to date  Flu Vaccine status: Due, Education has been provided regarding the importance of this vaccine. Advised may receive this vaccine at local pharmacy or Health Dept. Aware to provide a copy of the vaccination record if obtained from local pharmacy or Health Dept. Verbalized acceptance and understanding.  Pneumococcal vaccine status: Up to date  Covid-19 vaccine status: Completed vaccines  Qualifies for Shingles Vaccine? Yes   Zostavax completed Yes   Shingrix Completed?: Yes  Screening Tests Health Maintenance  Topic Date Due   INFLUENZA VACCINE  02/15/2021   COVID-19 Vaccine (5 - Booster for Moderna series) 04/12/2021   TETANUS/TDAP  11/29/2021   MAMMOGRAM  04/24/2022   COLONOSCOPY (Pts 45-74yrs Insurance coverage will need to be confirmed)  03/14/2023   DEXA SCAN  Completed   Hepatitis C Screening  Completed   Zoster Vaccines- Shingrix  Completed   HPV VACCINES  Aged Out    Health Maintenance  Health Maintenance Due  Topic Date Due   INFLUENZA VACCINE  02/15/2021   COVID-19 Vaccine (5 - Booster for Moderna series) 04/12/2021    Colorectal cancer screening: Type of screening: Colonoscopy. Completed 03/13/20. Repeat every 3 years  Mammogram status: Completed 04/24/20. Repeat every year  Bone Density status: Completed 12/16/20. Results reflect: Bone density results: OSTEOPENIA. Repeat every 2 years.    Additional Screening:  Hepatitis C Screening:  Completed 06/24/15  Vision Screening: Recommended annual ophthalmology exams for early detection of glaucoma and other disorders of the eye. Is the patient up to date with their annual eye exam?  Yes  Who is the provider or what is the name of the office in which the patient attends annual eye exams? Dr Delman Cheadle  If pt is not established with a provider, would they like to be referred to a  provider to establish care? No .   Dental Screening: Recommended annual dental exams for proper oral hygiene  Community Resource Referral / Chronic Care Management: CRR required this visit?  No   CCM required this visit?  No      Plan:     I have personally reviewed and noted the following in the patient's chart:   Medical and social history Use of alcohol, tobacco or illicit drugs  Current medications and supplements including opioid prescriptions.  Functional ability and status Nutritional status Physical activity Advanced directives List of other physicians Hospitalizations, surgeries, and ER visits in previous 12 months Vitals Screenings to include cognitive, depression, and falls Referrals and appointments  In addition, I have reviewed and discussed with patient certain preventive protocols, quality metrics, and best practice recommendations. A written personalized care plan for preventive services as well as general preventive health recommendations were provided to patient.     Willette Brace, LPN   [id number]   Nurse Notes: pt request 90 day refill on levothyroxine 88 mcg tablet instead of 30 day supply pt also request her last labs for blood sugar and thyroid levels, pt was also concerned related to blood pressure at 98/62 x's 2, denies any lightheadedness, sob or pain. stated she has been tired but for her that's a normal just wanted to inform you of findings please advise.

## 2021-04-20 NOTE — Patient Instructions (Signed)
Donna Manning , Thank you for taking time to come for your Medicare Wellness Visit. I appreciate your ongoing commitment to your health goals. Please review the following plan we discussed and let me know if I can assist you in the future.   Screening recommendations/referrals: Colonoscopy: Done 03/13/20 repeat every 3 years due 03/14/23 Mammogram: Done 04/24/20 repeat every year Bone Density: Done 12/16/20 repeat every 2 years  Recommended yearly ophthalmology/optometry visit for glaucoma screening and checkup Recommended yearly dental visit for hygiene and checkup  Vaccinations: Influenza vaccine: Due Pneumococcal vaccine: Completed Tdap vaccine: Done 11/30/11 repeat every 10 years due 11/29/21 Shingles vaccine: Completed 04/12/17 & 08/23/17   Covid-19:Completed 1/13, 2/10, 05/18/20 & 12/10/20  Advanced directives: Please bring a copy of your health care power of attorney and living will to the office at your convenience.  Conditions/risks identified: stay healthy   Next appointment: Follow up in one year for your annual wellness visit    Preventive Care 65 Years and Older, Female Preventive care refers to lifestyle choices and visits with your health care provider that can promote health and wellness. What does preventive care include? A yearly physical exam. This is also called an annual well check. Dental exams once or twice a year. Routine eye exams. Ask your health care provider how often you should have your eyes checked. Personal lifestyle choices, including: Daily care of your teeth and gums. Regular physical activity. Eating a healthy diet. Avoiding tobacco and drug use. Limiting alcohol use. Practicing safe sex. Taking low-dose aspirin every day. Taking vitamin and mineral supplements as recommended by your health care provider. What happens during an annual well check? The services and screenings done by your health care provider during your annual well check will depend on  your age, overall health, lifestyle risk factors, and family history of disease. Counseling  Your health care provider may ask you questions about your: Alcohol use. Tobacco use. Drug use. Emotional well-being. Home and relationship well-being. Sexual activity. Eating habits. History of falls. Memory and ability to understand (cognition). Work and work Statistician. Reproductive health. Screening  You may have the following tests or measurements: Height, weight, and BMI. Blood pressure. Lipid and cholesterol levels. These may be checked every 5 years, or more frequently if you are over 24 years old. Skin check. Lung cancer screening. You may have this screening every year starting at age 52 if you have a 30-pack-year history of smoking and currently smoke or have quit within the past 15 years. Fecal occult blood test (FOBT) of the stool. You may have this test every year starting at age 58. Flexible sigmoidoscopy or colonoscopy. You may have a sigmoidoscopy every 5 years or a colonoscopy every 10 years starting at age 18. Hepatitis C blood test. Hepatitis B blood test. Sexually transmitted disease (STD) testing. Diabetes screening. This is done by checking your blood sugar (glucose) after you have not eaten for a while (fasting). You may have this done every 1-3 years. Bone density scan. This is done to screen for osteoporosis. You may have this done starting at age 36. Mammogram. This may be done every 1-2 years. Talk to your health care provider about how often you should have regular mammograms. Talk with your health care provider about your test results, treatment options, and if necessary, the need for more tests. Vaccines  Your health care provider may recommend certain vaccines, such as: Influenza vaccine. This is recommended every year. Tetanus, diphtheria, and acellular pertussis (Tdap, Td) vaccine.  You may need a Td booster every 10 years. Zoster vaccine. You may need this  after age 56. Pneumococcal 13-valent conjugate (PCV13) vaccine. One dose is recommended after age 61. Pneumococcal polysaccharide (PPSV23) vaccine. One dose is recommended after age 81. Talk to your health care provider about which screenings and vaccines you need and how often you need them. This information is not intended to replace advice given to you by your health care provider. Make sure you discuss any questions you have with your health care provider. Document Released: 07/31/2015 Document Revised: 03/23/2016 Document Reviewed: 05/05/2015 Elsevier Interactive Patient Education  2017 [place] [employer] Prevention in the Home Falls can cause injuries. They can happen to people of all ages. There are many things you can do to make your home safe and to help prevent falls. What can I do on the outside of my home? Regularly fix the edges of walkways and driveways and fix any cracks. Remove anything that might make you trip as you walk through a door, such as a raised step or threshold. Trim any bushes or trees on the path to your home. Use bright outdoor lighting. Clear any walking paths of anything that might make someone trip, such as rocks or tools. Regularly check to see if handrails are loose or broken. Make sure that both sides of any steps have handrails. Any raised decks and porches should have guardrails on the edges. Have any leaves, snow, or ice cleared regularly. Use sand or salt on walking paths during winter. Clean up any spills in your garage right away. This includes oil or grease spills. What can I do in the bathroom? Use night lights. Install grab bars by the toilet and in the tub and shower. Do not use towel bars as grab bars. Use non-skid mats or decals in the tub or shower. If you need to sit down in the shower, use a plastic, non-slip stool. Keep the floor dry. Clean up any water that spills on the floor as soon as it happens. Remove soap buildup in the tub or  shower regularly. Attach bath mats securely with double-sided non-slip rug tape. Do not have throw rugs and other things on the floor that can make you trip. What can I do in the bedroom? Use night lights. Make sure that you have a light by your bed that is easy to reach. Do not use any sheets or blankets that are too big for your bed. They should not hang down onto the floor. Have a firm chair that has side arms. You can use this for support while you get dressed. Do not have throw rugs and other things on the floor that can make you trip. What can I do in the kitchen? Clean up any spills right away. Avoid walking on wet floors. Keep items that you use a lot in easy-to-reach places. If you need to reach something above you, use a strong step stool that has a grab bar. Keep electrical cords out of the way. Do not use floor polish or wax that makes floors slippery. If you must use wax, use non-skid floor wax. Do not have throw rugs and other things on the floor that can make you trip. What can I do with my stairs? Do not leave any items on the stairs. Make sure that there are handrails on both sides of the stairs and use them. Fix handrails that are broken or loose. Make sure that handrails are as long as  the stairways. Check any carpeting to make sure that it is firmly attached to the stairs. Fix any carpet that is loose or worn. Avoid having throw rugs at the top or bottom of the stairs. If you do have throw rugs, attach them to the floor with carpet tape. Make sure that you have a light switch at the top of the stairs and the bottom of the stairs. If you do not have them, ask someone to add them for you. What else can I do to help prevent falls? Wear shoes that: Do not have high heels. Have rubber bottoms. Are comfortable and fit you well. Are closed at the toe. Do not wear sandals. If you use a stepladder: Make sure that it is fully opened. Do not climb a closed stepladder. Make  sure that both sides of the stepladder are locked into place. Ask someone to hold it for you, if possible. Clearly mark and make sure that you can see: Any grab bars or handrails. First and last steps. Where the edge of each step is. Use tools that help you move around (mobility aids) if they are needed. These include: Canes. Walkers. Scooters. Crutches. Turn on the lights when you go into a dark area. Replace any light bulbs as soon as they burn out. Set up your furniture so you have a clear path. Avoid moving your furniture around. If any of your floors are uneven, fix them. If there are any pets around you, be aware of where they are. Review your medicines with your doctor. Some medicines can make you feel dizzy. This can increase your chance of falling. Ask your doctor what other things that you can do to help prevent falls. This information is not intended to replace advice given to you by your health care provider. Make sure you discuss any questions you have with your health care provider. Document Released: 04/30/2009 Document Revised: 12/10/2015 Document Reviewed: 08/08/2014 Elsevier Interactive Patient Education  2017 [employer].

## 2021-04-29 ENCOUNTER — Telehealth: Payer: Self-pay | Admitting: Pharmacist

## 2021-04-29 NOTE — Chronic Care Management (AMB) (Signed)
    Chronic Care Management Pharmacy Assistant   Name: Donna Manning  MRN: [id number] DOB: 12/31/1950  Reason for Encounter: Disease State / Hyperlipidemia and Hypothyroidism Assessment Call   Conditions to be addressed/monitored: HLD and Hypothyroidism  Recent office visits:  None  Recent consult visits:  None  Hospital visits:  None in previous 6 months  Medications: Outpatient Encounter Medications as of 04/29/2021  Medication Sig   levothyroxine (SYNTHROID) 88 MCG tablet TAKE 1 TABLET BY MOUTH EVERY DAY   Multiple Vitamins-Minerals (MULTIVITAMIN ADULTS 50+ PO) Take 1 tablet by mouth daily.   omeprazole (PRILOSEC) 40 MG capsule Take 40 mg by mouth in the morning and at bedtime.   tamoxifen (NOLVADEX) 20 MG tablet Take 1 tablet (20 mg total) by mouth daily.   No facility-administered encounter medications on file as of 04/29/2021.   Fill History:  LEVOTHYROXINE 88 MCG TABLET 04/15/2021 30   TAMOXIFEN 20 MG TABLET 04/14/2021 90   OMEPRAZOLE DR 40MG  CAP 11/27/2020 90    04/29/2021 Name: MAEVA DANT MRN: [id number] DOB: [birth year] NALLELY YOST is a 70 y.o. year old female who is a primary care patient of [name], [name], MD.  Comprehensive medication review performed; Spoke to patient regarding cholesterol  Lipid Panel    Component Value Date/Time   CHOL 137 07/07/2020 1024   TRIG 124 07/07/2020 1024   HDL 58 07/07/2020 1024   LDLCALC 58 07/07/2020 1024   LDLDIRECT 147.2 11/23/2011 0912    10-year ASCVD risk score: The 10-year ASCVD risk score (Arnett DK, et al., 2019) is: 5.8%   Values used to calculate the score:     Age: 57 years     Sex: Female     Is Non-Hispanic African American: No     Diabetic: No     Tobacco smoker: No     Systolic Blood Pressure: 98 mmHg     Is BP treated: No     HDL Cholesterol: 58 mg/dL     Total Cholesterol: 137 mg/dL  Current antihyperlipidemic regimen:  None  Previous antihyperlipidemic medications  tried: Rosuvastatin  ASCVD risk enhancing conditions: age >30  What recent interventions/DTPs have been made by any provider to improve Cholesterol control since last CPP Visit: None  Any recent hospitalizations or ED visits since last visit with CPP? No  What diet changes have been made to improve Cholesterol?  None, patient has cereal for breakfast, nothing most of the time for lunch and will eat a variety of chicken, pork and beef with a vegetable, starch and fruit for dinner.  What exercise is being done to improve Cholesterol?  Patient watches her grandchildren, no other structured activity.  Adherence Review: Does the patient have >5 day gap between last estimated fill dates? No  Patient scheduled for follow up 08/16/2020  Care Gaps: AWV - scheduled for 05/03/2021 Covid vaccine - overdue Flu vaccine - due  Star Rating Drugs: None  [place] Pharmacist Assistant [phone]

## 2021-05-10 ENCOUNTER — Other Ambulatory Visit: Payer: Self-pay | Admitting: Internal Medicine

## 2021-05-12 ENCOUNTER — Other Ambulatory Visit: Payer: Self-pay

## 2021-05-12 ENCOUNTER — Ambulatory Visit
Admission: RE | Admit: 2021-05-12 | Discharge: 2021-05-12 | Disposition: A | Discharge status: Home / Self Care | Payer: Medicare Other | Source: Ambulatory Visit | Attending: Internal Medicine | Admitting: Internal Medicine

## 2021-05-12 DIAGNOSIS — Z1231 Encounter for screening mammogram for malignant neoplasm of breast: Secondary | ICD-10-CM

## 2021-05-12 NOTE — Progress Notes (Signed)
Patient Care Team: [name], [name], MD as PCP - Desma Paganini, MD as PCP - Cardiology (Cardiology) [name], MD as Attending Physician (Obstetrics and Gynecology) Sharyne Peach, MD (Ophthalmology) Fanny Skates, MD as Consulting Physician (General Surgery) Thea Silversmith, MD as Consulting Physician (Radiation Oncology) Mauri Pole, MD as Consulting Physician (Gastroenterology) Nicholas Lose, MD as Consulting Physician (Hematology and Oncology) Viona Gilmore, [facility] as Pharmacist (Pharmacist)  DIAGNOSIS:    ICD-10-CM   1. Malignant neoplasm of upper-outer quadrant of right breast in female, estrogen receptor positive ([place])  C50.411    Z17.0       SUMMARY OF ONCOLOGIC HISTORY: Oncology History  Breast cancer of upper-outer quadrant of right female breast ([place])  12/24/2013 Initial Diagnosis   Breast cancer of upper-outer quadrant of right female breast: Initial biopsy showed DCIS with calcifications and atypical ductal hyperplasia with suspicion of stromal invasion ER 100% PR 100%   01/20/2014 Surgery   Right breast lumpectomy, IDC grade 1; 3.9 cm and 0.25 cm with DCIS int grade with ALH 3 SLN negative superior margin positive, ER 100% PR 100% HER-2 negative ratio 1.1 Ki-67 3% Oncotype 14 low risk    02/05/2014 Surgery   Reexcision of the superior margin no residual cancer   04/17/2014 - 05/16/2014 Radiation Therapy   Adjuvant radiation therapy   07/25/2014 -  Anti-estrogen oral therapy   Anastrozole 1 mg daily switched to tamoxifen 20 mg daily from 09/23/2014 due to osteoporosis   04/24/2018 Surgery   Mammogram on 03/27/18 showed a distortion in the left breast. Biopsy on 03/29/19 showed atypical lobular hyperplasia. Lumpectomy on 04/24/18 with Dr. Brantley Stage.     CHIEF COMPLIANT: Follow-up of right breast cancer on tamoxifen  INTERVAL HISTORY: Donna Manning is a 70 y.o. with above-mentioned history of right breast cancer treated with lumpectomy,  radiation, and who is currently on anti-estrogen therapy with tamoxifen. She presents to the clinic today for annual follow-up. She is tolerating the Tamoxifen well.  She sees Dr. Pamala Hurry in gynecology annually.  She remains active and notes she has torn her right meniscus and has been seeing Dr. Georgina Snell for injections.  It is feeling better.    REVIEW OF SYSTEMS: Review of Systems  Constitutional:  Negative for appetite change, chills, fatigue, fever and unexpected weight change.  HENT:   Negative for hearing loss, lump/mass and trouble swallowing.   Eyes:  Negative for eye problems and icterus.  Respiratory:  Negative for chest tightness, cough and shortness of breath.   Cardiovascular:  Negative for chest pain, leg swelling and palpitations.  Gastrointestinal:  Negative for abdominal distention, abdominal pain, constipation, diarrhea, nausea and vomiting.  Endocrine: Negative for hot flashes.  Genitourinary:  Negative for difficulty urinating.   Musculoskeletal:  Positive for arthralgias.  Skin:  Negative for itching and rash.  Neurological:  Negative for dizziness, extremity weakness, headaches and numbness.  Hematological:  Negative for adenopathy. Does not bruise/bleed easily.  Psychiatric/Behavioral:  Negative for depression. The patient is not nervous/anxious.     ALLERGIES:  is allergic to contrast media [iodinated diagnostic agents], actonel [risedronate sodium], and starch.  MEDICATIONS:  Current Outpatient Medications  Medication Sig Dispense Refill   calcium carbonate (OS-CAL - DOSED IN MG OF ELEMENTAL CALCIUM) 1250 (500 Ca) MG tablet Take 1 tablet by mouth. Takes every other day     levothyroxine (SYNTHROID) 88 MCG tablet TAKE 1 TABLET BY MOUTH EVERY DAY 30 tablet 0   Multiple Vitamins-Minerals (MULTIVITAMIN ADULTS  50+ PO) Take 1 tablet by mouth daily.     omeprazole (PRILOSEC) 40 MG capsule Take 40 mg by mouth daily.     tamoxifen (NOLVADEX) 20 MG tablet Take 1 tablet (20 mg  total) by mouth daily. 90 tablet 3   No current facility-administered medications for this visit.    PHYSICAL EXAMINATION: ECOG PERFORMANCE STATUS: 1 - Symptomatic but completely ambulatory  Vitals:   05/13/21 0913  BP: (!) 133/56  Pulse: 84  Resp: 18  Temp: 97.8 F (36.6 C)  SpO2: 99%   Filed Weights   05/13/21 0913  Weight: 175 lb 11.2 oz (79.7 kg)    GENERAL: Patient is a well appearing female in no acute distress HEENT:  Sclerae anicteric.  Oropharynx clear and moist. No ulcerations or evidence of oropharyngeal candidiasis. Neck is supple.  NODES:  No cervical, supraclavicular, or axillary lymphadenopathy palpated.  BREAST EXAM:  Declined breast exam LUNGS:  Clear to auscultation bilaterally.  No wheezes or rhonchi. HEART:  Regular rate and rhythm. No murmur appreciated. ABDOMEN:  Soft, nontender.  Positive, normoactive bowel sounds. No organomegaly palpated. MSK:  No focal spinal tenderness to palpation. Full range of motion bilaterally in the upper extremities. EXTREMITIES:  No peripheral edema.   SKIN:  Clear with no obvious rashes or skin changes. No nail dyscrasia. NEURO:  Nonfocal. Well oriented.  Appropriate affect.   LABORATORY DATA:  I have reviewed the data as listed CMP Latest Ref Rng & Units 09/08/2020 07/07/2020 09/06/2019  Glucose 65 - 99 mg/dL 75 101(H) 106(H)  BUN 8 - 27 mg/dL '15 11 12  ' Creatinine 0.57 - 1.00 mg/dL 0.84 0.70 0.89  Sodium 134 - 144 mmol/L 142 141 140  Potassium 3.5 - 5.2 mmol/L 5.0 4.4 4.0  Chloride 96 - 106 mmol/L 105 106 105  CO2 20 - 29 mmol/L '20 28 28  ' Calcium 8.7 - 10.3 mg/dL 9.1 9.3 9.1  Total Protein 6.1 - 8.1 g/dL - 6.6 -  Total Bilirubin 0.2 - 1.2 mg/dL - 0.4 -  Alkaline Phos 39 - 117 U/L - - -  AST 10 - 35 U/L - 19 -  ALT 6 - 29 U/L - 14 -    Lab Results  Component Value Date   WBC 6.1 09/08/2020   HGB 12.5 09/08/2020   HCT 37.8 09/08/2020   MCV 93 09/08/2020   PLT 257 09/08/2020   NEUTROABS 3.0 09/06/2019     ASSESSMENT & PLAN:  Breast cancer of upper-outer quadrant of right female breast ([place]) Right breast invasive ductal carcinoma T2, N0, M0 stage IIA 0.9 cm and a separate nodule 0.25 cm ER/PR positive HER-2 negative, Oncotype DX recurrence score is 14 (9% risk of distant recurrence over 10 years with tamoxifen alone) status post radiation therapy, started antiestrogen therapy with anastrozole in January 2016 years switched to tamoxifen 09/23/2014 (due to osteoporosis), plan to treat for 10 years 03/28/2018: Atypical lobular hyperplasia left breast biopsy, left lumpectomy: Fibrocystic change   Tamoxifen toxicities: Denies any hot flashes or myalgias, complains of hair thinning. Endometrial thickening: She no longer has a white discharge.   Osteoporosis: DEXA scan November 2015 showed a T score of -2.6 (because of which we switched treatment to tamoxifen): Prolia was offered but patient refused. Bone density 12/16/2020: T score -2.5: Osteoporosis (given previous adverse effects to bisphosphonates, patient wants to watch and monitor)--T score of spine improved from -2.6 to -2.2.  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 Breast exam-declined exam today 2. Mammogram 05/12/2021 results are pending   She will continue tamoxifen.  I reviewed that it will help reduce the risk of recurrence and also strengthen her bones.  I explained the difference between pap smears and pelvic exams.  Our recommendation is for annual pelvic exams with gynecology.    We will see Juliann Pulse in 1 year for continued f/u and surveillance.       The patient has a good understanding of the overall plan. she agrees with it. she will call with any problems that may develop before the next visit here.  Total time spent: 20 mins including face to face time and chart review, lab review, order entry, care coordination and documentation of  the encounter.    Wilber Bihari, NP 05/13/21 10:00 AM Medical Oncology and Hematology [facility] [place], [place] [address] Tel. [phone]    Fax. [phone]  *Total Encounter Time as defined by the Centers for Medicare and Medicaid Services includes, in addition to the face-to-face time of a patient visit (documented in the note above) non-face-to-face time: obtaining and reviewing outside history, ordering and reviewing medications, tests or procedures, care coordination (communications with other health care professionals or caregivers) and documentation in the medical record.   I, Thana Ates, am acting as scribe for Dr. Nicholas Lose.  I, Wilber Bihari, NP  have reviewed the above documentation for accuracy and completeness, and I agree with the above

## 2021-05-13 ENCOUNTER — Encounter: Payer: Self-pay | Admitting: Adult Health

## 2021-05-13 ENCOUNTER — Other Ambulatory Visit: Payer: Self-pay

## 2021-05-13 ENCOUNTER — Inpatient Hospital Stay: Payer: Medicare Other | Attending: Hematology and Oncology | Admitting: Adult Health

## 2021-05-13 DIAGNOSIS — C50411 Malignant neoplasm of upper-outer quadrant of right female breast: Secondary | ICD-10-CM

## 2021-05-13 DIAGNOSIS — Z79810 Long term (current) use of selective estrogen receptor modulators (SERMs): Secondary | ICD-10-CM | POA: Diagnosis not present

## 2021-05-13 DIAGNOSIS — M810 Age-related osteoporosis without current pathological fracture: Secondary | ICD-10-CM | POA: Insufficient documentation

## 2021-05-13 DIAGNOSIS — Z170 Estrogen receptor positive status [ER+]: Secondary | ICD-10-CM | POA: Diagnosis not present

## 2021-05-13 NOTE — Assessment & Plan Note (Addendum)
Right breast invasive ductal carcinoma T2, N0, M0 stage IIA 0.9 cm and a separate nodule 0.25 cm ER/PR positive HER-2 negative, Oncotype DX recurrence score is 14 (9% risk of distant recurrence over 10 years with tamoxifen alone) status post radiation therapy, started antiestrogen therapy with anastrozole in January 2016 years switched to tamoxifen 09/23/2014 (due to osteoporosis), plan to treat for 10 years 03/28/2018: Atypical lobular hyperplasia left breast biopsy, left lumpectomy: Fibrocystic change  Tamoxifen toxicities:Denies any hot flashes or myalgias, complains of hair thinning. Endometrial thickening:She no longer has a white discharge.  Osteoporosis: DEXA scan November 2015 showed a T score of -2.6(because of which we switched treatment to tamoxifen):Prolia was offered but patient refused. Bone density 12/16/2020: T score -2.5: Osteoporosis (given previous adverse effects to bisphosphonates, patient wants to watch and monitor)--T score of spine improved from -2.6 to -2.2.  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 Breast exam-declined exam today 2. Mammogram10/26/2022 results are pending  She will continue tamoxifen.  I reviewed that it will help reduce the risk of recurrence and also strengthen her bones.  I explained the difference between pap smears and pelvic exams.  Our recommendation is for annual pelvic exams with gynecology.    We will see Donna Manning in 1 year for continued f/u and surveillance.

## 2021-05-21 DIAGNOSIS — Z23 Encounter for immunization: Secondary | ICD-10-CM | POA: Diagnosis not present

## 2021-05-22 DIAGNOSIS — Z23 Encounter for immunization: Secondary | ICD-10-CM | POA: Diagnosis not present

## 2021-06-12 ENCOUNTER — Other Ambulatory Visit: Payer: Self-pay | Admitting: Internal Medicine

## 2021-06-20 DIAGNOSIS — J029 Acute pharyngitis, unspecified: Secondary | ICD-10-CM | POA: Diagnosis not present

## 2021-06-20 DIAGNOSIS — R6883 Chills (without fever): Secondary | ICD-10-CM | POA: Diagnosis not present

## 2021-06-20 DIAGNOSIS — K2270 Barrett's esophagus without dysplasia: Secondary | ICD-10-CM | POA: Insufficient documentation

## 2021-06-23 ENCOUNTER — Other Ambulatory Visit: Payer: Self-pay | Admitting: Internal Medicine

## 2021-07-12 ENCOUNTER — Other Ambulatory Visit: Payer: Self-pay | Admitting: Hematology and Oncology

## 2021-07-14 NOTE — Telephone Encounter (Signed)
Per last clinic note from [place], tamoxifen should be continued. Gardiner Rhyme, RN

## 2021-07-16 ENCOUNTER — Other Ambulatory Visit: Payer: Self-pay | Admitting: Internal Medicine

## 2021-07-21 ENCOUNTER — Ambulatory Visit (INDEPENDENT_AMBULATORY_CARE_PROVIDER_SITE_OTHER): Payer: Medicare Other | Admitting: Internal Medicine

## 2021-07-21 ENCOUNTER — Encounter: Payer: Self-pay | Admitting: Internal Medicine

## 2021-07-21 VITALS — BP 120/74 | HR 77 | Temp 98.7°F | Ht 67.0 in | Wt 169.2 lb

## 2021-07-21 DIAGNOSIS — Z79899 Other long term (current) drug therapy: Secondary | ICD-10-CM | POA: Diagnosis not present

## 2021-07-21 DIAGNOSIS — E785 Hyperlipidemia, unspecified: Secondary | ICD-10-CM | POA: Diagnosis not present

## 2021-07-21 DIAGNOSIS — Z170 Estrogen receptor positive status [ER+]: Secondary | ICD-10-CM

## 2021-07-21 DIAGNOSIS — C50411 Malignant neoplasm of upper-outer quadrant of right female breast: Secondary | ICD-10-CM | POA: Diagnosis not present

## 2021-07-21 DIAGNOSIS — E039 Hypothyroidism, unspecified: Secondary | ICD-10-CM

## 2021-07-21 DIAGNOSIS — R5383 Other fatigue: Secondary | ICD-10-CM | POA: Diagnosis not present

## 2021-07-21 DIAGNOSIS — R7301 Impaired fasting glucose: Secondary | ICD-10-CM | POA: Diagnosis not present

## 2021-07-21 LAB — TSH: TSH: 0.82 u[IU]/mL (ref 0.35–5.50)

## 2021-07-21 LAB — CBC WITH DIFFERENTIAL/PLATELET
Basophils Absolute: 0 10*3/uL (ref 0.0–0.1)
Basophils Relative: 1.1 % (ref 0.0–3.0)
Eosinophils Absolute: 0.1 10*3/uL (ref 0.0–0.7)
Eosinophils Relative: 3.2 % (ref 0.0–5.0)
HCT: 35.6 % — ABNORMAL LOW (ref 36.0–46.0)
Hemoglobin: 11.8 g/dL — ABNORMAL LOW (ref 12.0–15.0)
Lymphocytes Relative: 28.5 % (ref 12.0–46.0)
Lymphs Abs: 1.1 10*3/uL (ref 0.7–4.0)
MCHC: 33.2 g/dL (ref 30.0–36.0)
MCV: 92.8 fl (ref 78.0–100.0)
Monocytes Absolute: 0.4 10*3/uL (ref 0.1–1.0)
Monocytes Relative: 10.3 % (ref 3.0–12.0)
Neutro Abs: 2.2 10*3/uL (ref 1.4–7.7)
Neutrophils Relative %: 56.9 % (ref 43.0–77.0)
Platelets: 215 10*3/uL (ref 150.0–400.0)
RBC: 3.84 Mil/uL — ABNORMAL LOW (ref 3.87–5.11)
RDW: 12.4 % (ref 11.5–15.5)
WBC: 3.9 10*3/uL — ABNORMAL LOW (ref 4.0–10.5)

## 2021-07-21 LAB — BASIC METABOLIC PANEL
BUN: 13 mg/dL (ref 6–23)
CO2: 27 mEq/L (ref 19–32)
Calcium: 9 mg/dL (ref 8.4–10.5)
Chloride: 104 mEq/L (ref 96–112)
Creatinine, Ser: 0.81 mg/dL (ref 0.40–1.20)
GFR: 72.83 mL/min (ref >60.00)
Glucose, Bld: 100 mg/dL — ABNORMAL HIGH (ref 70–99)
Potassium: 3.9 mEq/L (ref 3.5–5.1)
Sodium: 139 mEq/L (ref 135–145)

## 2021-07-21 LAB — HEPATIC FUNCTION PANEL
ALT: 18 U/L (ref 0–35)
AST: 23 U/L (ref 0–37)
Albumin: 4.1 g/dL (ref 3.5–5.2)
Alkaline Phosphatase: 40 U/L (ref 39–117)
Bilirubin, Direct: 0.1 mg/dL (ref 0.0–0.3)
Total Bilirubin: 0.4 mg/dL (ref 0.2–1.2)
Total Protein: 6.8 g/dL (ref 6.0–8.3)

## 2021-07-21 LAB — LIPID PANEL
Cholesterol: 171 mg/dL (ref 0–200)
HDL: 47.9 mg/dL (ref >39.00)
LDL Cholesterol: 101 mg/dL — ABNORMAL HIGH (ref 0–99)
NonHDL: 123.13
Total CHOL/HDL Ratio: 4
Triglycerides: 112 mg/dL (ref 0.0–149.0)
VLDL: 22.4 mg/dL (ref 0.0–40.0)

## 2021-07-21 LAB — T4, FREE: Free T4: 0.95 ng/dL (ref 0.60–1.60)

## 2021-07-21 LAB — HEMOGLOBIN A1C: Hgb A1c MFr Bld: 6.2 % (ref 4.6–6.5)

## 2021-07-21 MED ORDER — LEVOTHYROXINE SODIUM 88 MCG PO TABS: 88.0000 ug | ORAL_TABLET | Freq: Every day | ORAL | 3 refills | Status: DC

## 2021-07-21 NOTE — Progress Notes (Signed)
Chief Complaint  Patient presents with   Annual Exam    HPI: Patient  Donna Manning  71 y.o. comes in today for yearly visit   Breast cancer  : on lifetime tamoxifen if tolerates  no bleeding  Thyroid diseaase on   med running out. More fatigue  no bleeding but check for anemia . Sees gyne every ? 3 years.  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 Carles Collet as if there is other options. Stressed and fatigue related to husband's decreased memory short-term difficulties. She has lost 10 pounds over the last 3 to 6 months.  Has not been trying eating fine.  And denies illness. Went off statin medicine when she had leg and joint pain as p[er Dr Harrington Challenger  was never put back on.  So she is not taking a statin.  Still has some symptoms so not sure if was the statin medicine. Health Maintenance  Topic Date Due   TETANUS/TDAP  11/29/2021   COLONOSCOPY (Pts 45-64yrs Insurance coverage will need to be confirmed)  03/14/2023   MAMMOGRAM  05/13/2023   Pneumonia Vaccine 86+ Years old  Completed   INFLUENZA VACCINE  Completed   DEXA SCAN  Completed   COVID-19 Vaccine  Completed   Hepatitis C Screening  Completed   Zoster Vaccines- Shingrix  Completed   HPV VACCINES  Aged Out   Health Maintenance Review LIFESTYLE:   HH  of 2 many grandchildren care taking busy   stress with husband  memory decline  askking ?s over and over.  Sleep 7 hours   ROS:  REST of 12 system review negative except as per HPI cericl dystonia get worse at tnights at times  saw dr Tat in 2019  she declined botox at that time   Past Medical History:  Diagnosis Date   Atypical lobular hyperplasia [facility]) of left breast 03/2018   Barrett's esophagus 2009   Benign head tremor    Breast cancer ([place])    Breast cancer, right breast ([place]) 01/2014   "had 5 weeks radiation after lumpectomy" (04/24/2018)   Dental bridge present    lower  left   Dental crowns present    GERD (gastroesophageal reflux disease)    History of angina    History of DVT (deep vein thrombosis) ~ 1980   after a pregnancy   History of radiation therapy 2015   Hyperlipidemia    Hypothyroidism    Irregular heart beat    "dx'd by 1 dr; not noted by the cardiologist" (04/24/2018)   NSAID-induced gastric ulcer    Osteoporosis    Personal history of radiation therapy    PONV (postoperative nausea and vomiting)     Past Surgical History:  Procedure Laterality Date   BREAST BIOPSY Right 1991   BREAST EXCISIONAL BIOPSY Left 2019   BREAST LUMPECTOMY Right 1991   BREAST LUMPECTOMY W/ NEEDLE LOCALIZATION Left 04/24/2018   BREAST LUMPECTOMY WITH NEEDLE LOCALIZATION Left 04/24/2018   Procedure: LEFT BREAST LUMPECTOMY WITH NEEDLE LOCALIZATION;  Surgeon: [name], MD;  Location: [place];  Service: General;  Laterality: Left;   COLONOSCOPY     "w/hemorrhoid banding"   COLOSTOMY  2009   PARTIAL MASTECTOMY WITH NEEDLE LOCALIZATION AND AXILLARY SENTINEL LYMPH NODE BX Right 01/20/2014   Procedure: RIGHT PARTIAL MASTECTOMY WITH DOUBLE  NEEDLE LOCALIZATION (BRACKETED )AND AXILLARY SENTINEL LYMPH NODE BX;  Surgeon: Renelda Loma  Alyssa Grove, MD;  Location: [place];  Service: General;  Laterality: Right;  And [place].   RE-EXCISION OF BREAST CANCER,SUPERIOR MARGINS Right 02/05/2014   Procedure: RIGHT LUMPECTOMY, RE-EXCISION OF BREAST CANCER,SUPERIOR MARGINS;  Surgeon: [name], MD;  Location: [place];  Service: General;  Laterality: Right;   UPPER GASTROINTESTINAL ENDOSCOPY  2016   UPPER GI ENDOSCOPY  12/25/2014   with Propofol    Family History  Problem Relation Age of Onset   Heart failure Mother 52   Diabetes Mother    Parkinsonism Father 37   Stroke Father    Thyroid disease Sister    Prostate cancer Brother    Stomach cancer Paternal Grandfather    Colon polyps Neg Hx    Crohn's disease Neg Hx     Esophageal cancer Neg Hx    Rectal cancer Neg Hx    Colon cancer Neg Hx     Social History   Socioeconomic History   Marital status: Married    Spouse name: Not on file   Number of children: 5   Years of education: Not on file   Highest education level: Not on file  Occupational History   Occupation: homemaker  Tobacco Use   Smoking status: Never   Smokeless tobacco: Never  Vaping Use   Vaping Use: Never used  Substance and Sexual Activity   Alcohol use: Not Currently    Comment: 04/24/2018 "drank a few beers in college"   Drug use: Never   Sexual activity: Not Currently  Other Topics Concern   Not on file  Social History Narrative   Not on file   Social Determinants of Health   Financial Resource Strain: Low Risk    Difficulty of Paying Living Expenses: Not hard at all  Food Insecurity: No Food Insecurity   Worried About Charity fundraiser in the Last Year: Never true   [place] in the Last Year: Never true  Transportation Needs: No Transportation Needs   Lack of Transportation (Medical): No   Lack of Transportation (Non-Medical): No  Physical Activity: Insufficiently Active   Days of Exercise per Week: 5 days   Minutes of Exercise per Session: 20 min  Stress: No Stress Concern Present   Feeling of Stress : Not at all  Social Connections: Socially Integrated   Frequency of Communication with Friends and Family: More than three times a week   Frequency of Social Gatherings with Friends and Family: More than three times a week   Attends Religious Services: 1 to 4 times per year   Active Member of [employer] or Organizations: Yes   Attends Archivist Meetings: 1 to 4 times per year   Marital Status: Married    Outpatient Medications Prior to Visit  Medication Sig Dispense Refill   calcium carbonate (OS-CAL - DOSED IN MG OF ELEMENTAL CALCIUM) 1250 (500 Ca) MG tablet Take 1 tablet by mouth. Takes every other day     Multiple Vitamins-Minerals  (MULTIVITAMIN ADULTS 50+ PO) Take 1 tablet by mouth daily.     omeprazole (PRILOSEC) 40 MG capsule Take 40 mg by mouth daily.     tamoxifen (NOLVADEX) 20 MG tablet TAKE 1 TABLET BY MOUTH EVERY DAY 90 tablet 3   levothyroxine (SYNTHROID) 88 MCG tablet TAKE 1 TABLET BY MOUTH EVERY DAY 30 tablet 0   No facility-administered medications prior to visit.     EXAM:  BP 120/74 (BP Location: Left Arm,  Patient Position: Sitting, Cuff Size: Normal)    Pulse 77    Temp 98.7 F (37.1 C) (Oral)    Ht 5\' 7"  (1.702 m)    Wt 169 lb 3.2 oz (76.7 kg)    SpO2 97%    BMI 26.50 kg/m   Body mass index is 26.5 kg/m. Wt Readings from Last 3 Encounters:  07/21/21 169 lb 3.2 oz (76.7 kg)  05/13/21 175 lb 11.2 oz (79.7 kg)  04/20/21 176 lb 8 oz (80.1 kg)    Physical Exam: Vital signs reviewed STM:HDQQ is a well-developed well-nourished alert cooperative    who appearsr stated age in no acute distress.  HEENT: normocephalic atraumatic , Eyes: PERRL EOM's full, conjunctiva clear, Nares: paten,t no deformity discharge or tenderness., Ears: no deformity EAC's clear TMs with normal landmarks. Mouth: masked  NECK: supple without masses, thyromegaly or bruits. CHEST/PULM:  Clear to auscultation and percussion breath sounds equal no wheeze , rales or rhonchi. No chest wall deformities or tenderness. Breast: normal by inspection . No masses CV: PMI is nondisplaced, S1 S2 no gallops, murmurs, rubs. Peripheral pulses are present without delay.No JVD .  ABDOMEN: Bowel sounds normal nontender  No guard or rebound, no hepato splenomegal no CVA tenderness.   Extremtities:  No clubbing cyanosis or edema, no acute joint swelling or redness no focal atrophy NEURO:  Oriented x3, cranial nerves 3-12 appear to be intact, no obvious focal weakness,gait within normal limits no abnormal reflexes or asymmetrical SKIN: No acute rashes normal turgor, color, no bruising or petechiae. PSYCH: Oriented, good eye contact, no obvious  depression anxiety, cognition and judgment appear normal. LN: no cervical axillary inguinal adenopathy  Lab Results  Component Value Date   WBC 3.9 (L) 07/21/2021   HGB 11.8 (L) 07/21/2021   HCT 35.6 (L) 07/21/2021   PLT 215.0 07/21/2021   GLUCOSE 100 (H) 07/21/2021   CHOL 171 07/21/2021   TRIG 112.0 07/21/2021   HDL 47.90 07/21/2021   LDLDIRECT 147.2 11/23/2011   LDLCALC 101 (H) 07/21/2021   ALT 18 07/21/2021   AST 23 07/21/2021   NA 139 07/21/2021   K 3.9 07/21/2021   CL 104 07/21/2021   CREATININE 0.81 07/21/2021   BUN 13 07/21/2021   CO2 27 07/21/2021   TSH 0.82 07/21/2021   HGBA1C 6.2 07/21/2021    BP Readings from Last 3 Encounters:  07/21/21 120/74  05/13/21 (!) 133/56  04/20/21 98/62    Lab plan reviewed with patient   ASSESSMENT AND PLAN:  Discussed the following assessment and plan:    ICD-10-CM   1. Hypothyroidism, unspecified type  E03.9 TSH    T4, free    Lipid panel    CBC with Differential/Platelet    Basic metabolic panel    Hepatic function panel    Hemoglobin A1c    Hemoglobin A1c    Hepatic function panel    Basic metabolic panel    CBC with Differential/Platelet    Lipid panel    T4, free    TSH    2. Other fatigue  R53.83 TSH    T4, free    Lipid panel    CBC with Differential/Platelet    Basic metabolic panel    Hepatic function panel    Hemoglobin A1c    Hemoglobin A1c    Hepatic function panel    Basic metabolic panel    CBC with Differential/Platelet    Lipid panel    T4, free    TSH  3. Hyperlipidemia, unspecified hyperlipidemia type  E78.5 TSH    T4, free    Lipid panel    CBC with Differential/Platelet    Basic metabolic panel    Hepatic function panel    Hemoglobin A1c    Hemoglobin A1c    Hepatic function panel    Basic metabolic panel    CBC with Differential/Platelet    Lipid panel    T4, free    TSH    4. Fasting hyperglycemia  R73.01 TSH    T4, free    Lipid panel    CBC with  Differential/Platelet    Basic metabolic panel    Hepatic function panel    Hemoglobin A1c    Hemoglobin A1c    Hepatic function panel    Basic metabolic panel    CBC with Differential/Platelet    Lipid panel    T4, free    TSH    5. Medication management  Z79.899 TSH    T4, free    Lipid panel    CBC with Differential/Platelet    Basic metabolic panel    Hepatic function panel    Hemoglobin A1c    Hemoglobin A1c    Hepatic function panel    Basic metabolic panel    CBC with Differential/Platelet    Lipid panel    T4, free    TSH    6. Malignant neoplasm of upper-outer quadrant of right breast in female, estrogen receptor positive ([place])  C50.411 TSH   Z17.0 T4, free    Lipid panel    CBC with Differential/Platelet    Basic metabolic panel    Hepatic function panel    Hemoglobin A1c    Hemoglobin A1c    Hepatic function panel    Basic metabolic panel    CBC with Differential/Platelet    Lipid panel    T4, free    TSH     Breast cancer under suppression tamoxifen Due for lab monitoring  Fatigue  multiple factors but no loss of exercise tolerance  Exam un revealing .   Check with gyne for gu sx if needed. Plan fu if progressive unexpected weight loss  Return in about 1 year (around 07/21/2022) for depending on results.  Patient Care Team: Infinity Jeffords, [name], MD as PCP - General Fay Records, MD as PCP - Cardiology (Cardiology) [name], MD as Attending Physician (Obstetrics and Gynecology) Sharyne Peach, MD (Ophthalmology) Fanny Skates, MD as Consulting Physician (General Surgery) Thea Silversmith, MD as Consulting Physician (Radiation Oncology) Mauri Pole, MD as Consulting Physician (Gastroenterology) Nicholas Lose, MD as Consulting Physician (Hematology and Oncology) Viona Gilmore, [facility] as Pharmacist (Pharmacist) Patient Instructions  Good to see you today .  Checking  for anemai thyroid and other causes of fatigue   LIPID panel off of  the atorvastatin.  Will forward info to Dr Harrington Challenger.  Consider check with Dr tat office about any other intervention x botox.   Check with gyne about the urinary sx .  ? Kegels ? Physical therapy.  Will refill thyroid  med today   [name]. Jaciel Diem M.D.

## 2021-07-21 NOTE — Patient Instructions (Signed)
Good to see you today .  Checking  for anemai thyroid and other causes of fatigue   LIPID panel off of the atorvastatin.  Will forward info to Dr Harrington Challenger.  Consider check with Dr tat office about any other intervention x botox.   Check with gyne about the urinary sx .  ? Kegels ? Physical therapy.  Will refill thyroid  med today

## 2021-07-25 NOTE — Progress Notes (Signed)
Results show mild amenia .   Marland Kitchen  Rest of labs are normal or stable   .    Arrange    further lab  ibc and ferritin  b12 level and cbcdiff , esr , crp.   Dx mild anemia    (your colonoscopy is UTD . )  then plan follow up depending on results .

## 2021-07-29 ENCOUNTER — Telehealth: Payer: Self-pay | Admitting: Internal Medicine

## 2021-07-29 NOTE — Telephone Encounter (Signed)
Pt has viewed her blood work results on [employer] and has questions about some of blood work abnormal readings. Please advise

## 2021-07-30 NOTE — Telephone Encounter (Signed)
Patient has questions about the red exclamation marks beside some of the lab results and wants to know if she should be taking iron supplements or eating more spinach, I informed patient eating more spinach would not hurt and I would asked Dr. Regis Bill about iron supplement.

## 2021-08-12 ENCOUNTER — Telehealth: Payer: Self-pay | Admitting: Pharmacist

## 2021-08-12 NOTE — Chronic Care Management (AMB) (Signed)
° ° °  Chronic Care Management Pharmacy Assistant   Name: Donna Manning  MRN: [id number] DOB: 10-30-1950  08/16/2021 APPOINTMENT REMINDER   Called [name], No answer, left message of appointment on 08/16/2021 at 3:45 via telephone visit with [name], Pharm D. Notified to have all medications, supplements, blood pressure and/or blood sugar logs available during appointment and to return call if need to reschedule.  Care Gaps: AWV - scheduled for 05/03/2022 Last BP - 120/74 on 07/21/2021  Star Rating Drug: None  Any gaps in medications fill history? No  [name]  Catering manager [phone]

## 2021-08-16 ENCOUNTER — Ambulatory Visit (INDEPENDENT_AMBULATORY_CARE_PROVIDER_SITE_OTHER): Payer: Medicare Other | Admitting: Pharmacist

## 2021-08-16 DIAGNOSIS — E039 Hypothyroidism, unspecified: Secondary | ICD-10-CM

## 2021-08-16 DIAGNOSIS — E785 Hyperlipidemia, unspecified: Secondary | ICD-10-CM

## 2021-08-16 DIAGNOSIS — E538 Deficiency of other specified B group vitamins: Secondary | ICD-10-CM

## 2021-08-16 DIAGNOSIS — K2270 Barrett's esophagus without dysplasia: Secondary | ICD-10-CM

## 2021-08-16 DIAGNOSIS — D649 Anemia, unspecified: Secondary | ICD-10-CM

## 2021-08-16 DIAGNOSIS — R5383 Other fatigue: Secondary | ICD-10-CM

## 2021-08-16 DIAGNOSIS — M810 Age-related osteoporosis without current pathological fracture: Secondary | ICD-10-CM

## 2021-08-16 NOTE — Progress Notes (Addendum)
Chronic Care Management Pharmacy Note  08/19/2021 Name:  Donna Manning MRN:  [id number] DOB:  [birth year]  Summary: TSH not optimal given osteoporosis Pt inquired about restarting cholesterol medication  Recommendations/Changes made from today's visit: -Recommend decreasing levothyroxine to target higher TSH level -Consider follow up with PCP to discuss osteoporosis medication -Reached out to cardiology to discuss restarting cholesterol medication  Plan: Follow up after discussion with PCP and cardiology   Subjective: Donna Manning is an 71 y.o. year old female who is a primary patient of [name], [name], MD.  The CCM team was consulted for assistance with disease management and care coordination needs.    Engaged with patient by telephone for follow up visit in response to provider referral for pharmacy case management and/or care coordination services.   Consent to Services:  The patient was given information about Chronic Care Management services, agreed to services, and gave verbal consent prior to initiation of services.  Please see initial visit note for detailed documentation.   Patient Care Team: [name], [name], MD as PCP - General Fay Records, MD as PCP - Cardiology (Cardiology) [name], MD as Attending Physician (Obstetrics and Gynecology) Sharyne Peach, MD (Ophthalmology) Fanny Skates, MD as Consulting Physician (General Surgery) Thea Silversmith, MD as Consulting Physician (Radiation Oncology) Mauri Pole, MD as Consulting Physician (Gastroenterology) Nicholas Lose, MD as Consulting Physician (Hematology and Oncology) Viona Gilmore, [facility] as Pharmacist (Pharmacist)  Recent office visits: 07/21/21 [name], MD: Patient presented for annual exam.   04/20/21 Randel Pigg, LPN: Patient presented for AWV.  Recent consult visits: 05/13/21 Wilber Bihari, NP (hem/onc): Patient presented for breast cancer follow up.   02/03/21 [name] MD (Sports Medicine) - seen for right anterior knee pain. No medication changes. No follow up noted.   Hospital visits: 06/20/21 Patient presented to [facility] express care in [place] for acute pharyngitis.   Objective:  Lab Results  Component Value Date   CREATININE 0.81 07/21/2021   BUN 13 07/21/2021   GFR 72.83 07/21/2021   EGFR 77 (L) 09/08/2016   GFRNONAA 71 09/08/2020   GFRAA 81 09/08/2020   NA 139 07/21/2021   K 3.9 07/21/2021   CALCIUM 9.0 07/21/2021   CO2 27 07/21/2021   GLUCOSE 100 (H) 07/21/2021    Lab Results  Component Value Date/Time   HGBA1C 6.2 07/21/2021 11:57 AM   HGBA1C 6.2 09/06/2019 11:47 AM   GFR 72.83 07/21/2021 11:57 AM   GFR 62.72 09/06/2019 11:47 AM    Last diabetic Eye exam: No results found for: HMDIABEYEEXA  Last diabetic Foot exam: No results found for: HMDIABFOOTEX   Lab Results  Component Value Date   CHOL 171 07/21/2021   HDL 47.90 07/21/2021   LDLCALC 101 (H) 07/21/2021   LDLDIRECT 147.2 11/23/2011   TRIG 112.0 07/21/2021   CHOLHDL 4 07/21/2021    Hepatic Function Latest Ref Rng & Units 07/21/2021 07/07/2020 06/17/2019  Total Protein 6.0 - 8.3 g/dL 6.8 6.6 6.6  Albumin 3.5 - 5.2 g/dL 4.1 - 4.0  AST 0 - 37 U/L '23 19 23  ' ALT 0 - 35 U/L '18 14 19  ' Alk Phosphatase 39 - 117 U/L 40 - 31(L)  Total Bilirubin 0.2 - 1.2 mg/dL 0.4 0.4 0.6  Bilirubin, Direct 0.0 - 0.3 mg/dL 0.1 0.1 0.1    Lab Results  Component Value Date/Time   TSH 0.82 07/21/2021 11:57 AM   TSH 1.27 07/07/2020 10:24 AM   FREET4 0.95  07/21/2021 11:57 AM   FREET4 0.90 06/17/2019 10:07 AM    CBC Latest Ref Rng & Units 07/21/2021 09/08/2020 09/06/2019  WBC 4.0 - 10.5 K/uL 3.9(L) 6.1 5.6  Hemoglobin 12.0 - 15.0 g/dL 11.8(L) 12.5 12.4  Hematocrit 36.0 - 46.0 % 35.6(L) 37.8 37.6  Platelets 150.0 - 400.0 K/uL 215.0 257 259.0    Lab Results  Component Value Date/Time   VD25OH 41 06/24/2010 09:35 PM    Clinical ASCVD: No  The 10-year ASCVD risk score (Arnett  DK, et al., 2019) is: 9.2%   Values used to calculate the score:     Age: 71 years     Sex: Female     Is Non-Hispanic African American: No     Diabetic: No     Tobacco smoker: No     Systolic Blood Pressure: 124 mmHg     Is BP treated: No     HDL Cholesterol: 47.9 mg/dL     Total Cholesterol: 171 mg/dL    Depression screen [facility] 2/9 07/21/2021 04/20/2021 04/15/2020  Decreased Interest 0 0 0  Down, Depressed, Hopeless 0 0 0  PHQ - 2 Score 0 0 0  Altered sleeping 0 - -  Tired, decreased energy 1 - -  Change in appetite 0 - -  Feeling bad or failure about yourself  0 - -  Trouble concentrating 0 - -  Moving slowly or fidgety/restless 0 - -  Suicidal thoughts 0 - -  PHQ-9 Score 1 - -  Difficult doing work/chores Not difficult at all - -  Some recent data might be hidden      Social History   Tobacco Use  Smoking Status Never  Smokeless Tobacco Never   BP Readings from Last 3 Encounters:  07/21/21 120/74  05/13/21 (!) 133/56  04/20/21 98/62   Pulse Readings from Last 3 Encounters:  07/21/21 77  05/13/21 84  04/20/21 87   Wt Readings from Last 3 Encounters:  07/21/21 169 lb 3.2 oz (76.7 kg)  05/13/21 175 lb 11.2 oz (79.7 kg)  04/20/21 176 lb 8 oz (80.1 kg)   BMI Readings from Last 3 Encounters:  07/21/21 26.50 kg/m  05/13/21 27.52 kg/m  04/20/21 27.64 kg/m    Assessment/Interventions: Review of patient past medical history, allergies, medications, health status, including review of consultants reports, laboratory and other test data, was performed as part of comprehensive evaluation and provision of chronic care management services.   SDOH:  (Social Determinants of Health) assessments and interventions performed: No  SDOH Screenings   Alcohol Screen: Not on file  Depression (PHQ2-9): Low Risk    PHQ-2 Score: 1  Financial Resource Strain: Low Risk    Difficulty of Paying Living Expenses: Not hard at all  Food Insecurity: No Food Insecurity   Worried About  Charity fundraiser in the Last Year: Never true   Ran Out of Food in the Last Year: Never true  Housing: Low Risk    Last Housing Risk Score: 0  Physical Activity: Insufficiently Active   Days of Exercise per Week: 5 days   Minutes of Exercise per Session: 20 min  Social Connections: Engineer, building services of Communication with Friends and Family: More than three times a week   Frequency of Social Gatherings with Friends and Family: More than three times a week   Attends Religious Services: 1 to 4 times per year   Active Member of [employer] or Organizations: Yes   Attends Club or  Organization Meetings: 1 to 4 times per year   Marital Status: Married  Stress: No Stress Concern Present   Feeling of Stress : Not at all  Tobacco Use: Low Risk    Smoking Tobacco Use: Never   Smokeless Tobacco Use: Never   Passive Exposure: Not on file  Transportation Needs: No Transportation Needs   Lack of Transportation (Medical): No   Lack of Transportation (Non-Medical): No    CCM Care Plan  Allergies  Allergen Reactions   Contrast Media [Iodinated Contrast Media] Hives and Shortness Of Breath   Actonel [Risedronate Sodium] Other (See Comments)    HIP PAIN   Starch Rash    IN LATEX GLOVES    Medications Reviewed Today     Reviewed by Burnis Medin, MD (Physician) on 07/21/21 at [place] List Status: <None>   Medication Order Taking? Sig Documenting Provider Last Dose Status Informant  calcium carbonate (OS-CAL - DOSED IN MG OF ELEMENTAL CALCIUM) 1250 (500 Ca) MG tablet [id number] Yes Take 1 tablet by mouth. Takes every other day [provider] Taking Active Self  levothyroxine (SYNTHROID) 88 MCG tablet [id number]  Take 1 tablet (88 mcg total) by mouth daily. [name], [name], MD  Active   Multiple Vitamins-Minerals (MULTIVITAMIN ADULTS 50+ PO) [id number] Yes Take 1 tablet by mouth daily. [provider] Taking Active   omeprazole (PRILOSEC) 40 MG capsule [id number]  Yes Take 40 mg by mouth daily. [provider] Taking Active   tamoxifen (NOLVADEX) 20 MG tablet [id number] Yes TAKE 1 TABLET BY MOUTH EVERY DAY Causey, Charlestine Massed, NP Taking Active             Patient Active Problem List   Diagnosis Date Noted   Visit for preventive health examination 12/03/2011    Priority: High   Breast cancer of upper-outer quadrant of right female breast ([place]) 12/31/2013    Priority: Medium    HYPERLIPIDEMIA 04/24/2007    Priority: Medium    Barrett's esophagus 06/20/2021    Priority: Low   Hypothyroidism 05/12/2008    Priority: Low   DVT, HX OF 04/24/2007    Priority: Low   Aspiration into airway 04/24/2018   Dyspepsia 10/27/2014   Colon cancer screening 10/27/2014   High triglycerides 12/03/2011   Osteopenia    CAROTID BRUIT 07/16/2010   Carotid bruit 07/16/2010   WEIGHT LOSS 06/24/2010   NONSPECIFIC ABNORMAL ELECTROCARDIOGRAM 06/24/2010   SKIN LESION, UNCERTAIN SIGNIFICANCE 06/24/2010   Solar keratosis 06/24/2010   FATIGUE 06/01/2009   CHEST PAIN, ATYPICAL 06/01/2009   HERPES LABIALIS 12/10/2008   DYSFUNCTION OF EUSTACHIAN TUBE 12/10/2008   ESOPHAGITIS 06/21/2008   Esophagitis 06/21/2008   ADVERSE REACTION TO MEDICATION 05/12/2008   GERD 04/24/2007   OSTEOPENIA 04/24/2007    Immunization History  Administered Date(s) Administered   Fluad Quad(high Dose 65+) 05/21/2021   Influenza Split 05/27/2012, 06/02/2020   Influenza, High Dose Seasonal PF 07/17/2015, 06/27/2016, 05/24/2017   Influenza-Unspecified 05/24/2018   Moderna SARS-COV2 Booster Vaccination 05/18/2020, 12/10/2020   Moderna Sars-Covid-2 Vaccination 07/31/2019, 08/28/2019, 12/10/2020   Pfizer Covid-19 Vaccine Bivalent Booster 5y-11y 05/22/2021   Pneumococcal Conjugate-13 05/24/2017   Pneumococcal Polysaccharide-23 06/04/2018   Tdap 11/30/2011   Zoster Recombinat (Shingrix) 04/12/2017, 08/23/2017   Patient thought she was having pain in arm and leg because of the  rosuvastatin and then it was stopped and she continued to have symptoms. She inquired about  Patient is not trying to lose weight and is concerned about this and  is having less of an appetite.  Conditions to be addressed/monitored:  Hyperlipidemia, GERD, Osteopenia, and Breast cancer  Conditions addressed this visit: Hyperlipidemia, GERD  There are no care plans that you recently modified to display for this patient.    Medication Assistance: None required.  Patient affirms current coverage meets needs.  Compliance/Adherence/Medication fill history: Care Gaps: Last BP - 120/74 on 07/21/2021  Star-Rating Drugs: None  Patient's [facility] pharmacy [facility]:  [id number] [address]Phone: [phone]: [phone] Uses pill box? No - patient has her own system Pt endorses 100% compliance  We discussed: Current pharmacy is preferred with insurance plan and patient is satisfied with pharmacy services Patient decided to: Continue current medication management strategy  Care Plan and Follow Up Patient Decision:  Patient agrees to Care Plan and Follow-up.  Plan: The care management team will reach out to the patient again over the next 30 days.  MJeni Salles PharmD, [place] 07/25/2021  8:21 PM EST     Results show mild amenia .   .Marland Kitchen Rest of labs are normal or stable   .    Arrange    further lab  ibc and ferritin  b12 level and cbcdiff , esr , crp.   Dx mild anemia    (your colonoscopy is UTD . )  then plan follow up depending on results .     It appears that orders  I  advised from last visit were not placed in the record.  Nor arranged   Which should have included IBC ferritin B12 CBC differential sed rate and CRP This directive was requested January 8. At this time would just repeat her TSH level before adjusting dose.in addition to above  labs  and add vit D level. I will place the orders for future labs  have her get a lab draw appointment and then make a follow-up visit in person or virtual to discuss the results.  WShanon Ace MD

## 2021-08-16 NOTE — Patient Instructions (Signed)
Hi Donna Manning,  It was great to catch up with you again!  Please reach out to me if you have any questions or need anything before our follow up!  Best, Donna Manning  Donna Manning, PharmD, [place] [facility] [place]   Visit Information   Goals Addressed   None    Patient Care Plan: CCM Pharmacy Care Plan     Problem Identified: Problem: Hyperlipidemia, GERD, Osteopenia, and Breast cancer      Long-Range Goal: Patient-Specific Goal   Start Date: 08/16/2021  Expected End Date: 08/16/2022  This Visit's Progress: On track  Priority: High  Note:   Current Barriers:  Unable to independently monitor therapeutic efficacy  Pharmacist Clinical Goal(s):  Patient will achieve adherence to monitoring guidelines and medication adherence to achieve therapeutic efficacy through collaboration with PharmD and provider.   Interventions: 1:1 collaboration with [name], [name], MD regarding development and update of comprehensive plan of care as evidenced by provider attestation and co-signature Inter-disciplinary care team collaboration (see longitudinal plan of care) Comprehensive medication review performed; medication list updated in electronic medical record  Hyperlipidemia: (LDL goal < 100) -Not ideally controlled -Current treatment: No medications -Medications previously tried: rosuvastatin (leg cramps)  -Current dietary patterns: limiting fried foods -Current exercise habits: did not discuss -Educated on Cholesterol goals;  Importance of limiting foods high in cholesterol; -Collaborated with Dr. Harrington Challenger about restarting statin therapy.  Osteoporosis (Goal prevent fractures) -Controlled -Last DEXA Scan: 12/2020   T-Score femoral neck: -2.5  T-Score total hip: n/a  T-Score lumbar spine: -2.2  T-Score forearm radius: n/a  10-year probability of major osteoporotic fracture: n/a  10-year probability of hip fracture: n/a -Patient is a  candidate for pharmacologic treatment due to T-Score < -2.5 in femoral neck -Current treatment  Multivitamin (1000 units of vitamin D and 300 mg of calcium) Calcium with vitamin D daily -Medications previously tried: none  -Recommend [id number] units of vitamin D daily. Recommend 1200 mg of calcium daily from dietary and supplemental sources. Recommend weight-bearing and muscle strengthening exercises for building and maintaining bone density. -Counseled on diet and exercise extensively  Hypothyroidism (Goal: TSH 2.5-4.5 based on osteoporosis) -Not ideally controlled -Current treatment  Levothyroxine 88 mcg 1 tablet daily - Appropriate, Effective, Query Safe, Accessible -Medications previously tried: none  -Recommended decreasing dose based on low TSH with osteopenia.  Breast cancer (Goal: remission) -Controlled -Current treatment  Tamoxifen 20 mg 1 tablet daily - Appropriate, Effective, Safe, Accessible -Medications previously tried: none  -Recommended to continue current medication  GERD/Barrett's esophagus  (Goal: minimize symptoms) -Controlled -Current treatment  Omeprazole 40 mg 1 capsule twice daily  - Appropriate, Effective, Safe, Accessible -Medications previously tried: none  -Recommended to continue current medication   Health Maintenance -Vaccine gaps: none -Current therapy:  Multivitamin daily -Educated on Cost vs benefit of each product must be carefully weighed by individual consumer -Patient is satisfied with current therapy and denies issues -Recommended to continue current medication  Patient Goals/Self-Care Activities Patient will:  - take medications as prescribed as evidenced by patient report and record review  Follow Up Plan: The care management team will reach out to the patient again over the next 30 days.        Patient verbalizes understanding of instructions and care plan provided today and agrees to view in [place]. Active MyChart status  confirmed with patient.   The pharmacy team will reach out to the patient again over the next 30 days.   Donna Manning  Donna Manning, Donna Manning

## 2021-08-17 DIAGNOSIS — D649 Anemia, unspecified: Secondary | ICD-10-CM | POA: Diagnosis not present

## 2021-08-17 DIAGNOSIS — M810 Age-related osteoporosis without current pathological fracture: Secondary | ICD-10-CM | POA: Diagnosis not present

## 2021-08-17 DIAGNOSIS — E039 Hypothyroidism, unspecified: Secondary | ICD-10-CM | POA: Diagnosis not present

## 2021-08-17 DIAGNOSIS — E785 Hyperlipidemia, unspecified: Secondary | ICD-10-CM | POA: Diagnosis not present

## 2021-08-19 NOTE — Addendum Note (Signed)
Addended byShanon Ace K on: 08/19/2021 11:56 AM   Modules accepted: Orders

## 2021-08-19 NOTE — Telephone Encounter (Signed)
See threads  attached to CDM addnedum . (Plan advised was not  addressed)  I placed the lab orders today requested from Jan 8  .( See Maddie's note ). And then plan fu

## 2021-09-03 NOTE — Telephone Encounter (Signed)
Called patient and set up lab visit for next week. Patient will set up follow up with PCP pending results.

## 2021-09-08 ENCOUNTER — Other Ambulatory Visit (INDEPENDENT_AMBULATORY_CARE_PROVIDER_SITE_OTHER): Payer: Medicare Other

## 2021-09-08 ENCOUNTER — Telehealth: Payer: Self-pay | Admitting: Internal Medicine

## 2021-09-08 DIAGNOSIS — D649 Anemia, unspecified: Secondary | ICD-10-CM | POA: Diagnosis not present

## 2021-09-08 DIAGNOSIS — E039 Hypothyroidism, unspecified: Secondary | ICD-10-CM | POA: Diagnosis not present

## 2021-09-08 DIAGNOSIS — R5383 Other fatigue: Secondary | ICD-10-CM | POA: Diagnosis not present

## 2021-09-08 DIAGNOSIS — M810 Age-related osteoporosis without current pathological fracture: Secondary | ICD-10-CM

## 2021-09-08 DIAGNOSIS — E538 Deficiency of other specified B group vitamins: Secondary | ICD-10-CM | POA: Diagnosis not present

## 2021-09-08 LAB — CBC WITH DIFFERENTIAL/PLATELET
Basophils Absolute: 0.1 10*3/uL (ref 0.0–0.1)
Basophils Relative: 1.2 % (ref 0.0–3.0)
Eosinophils Absolute: 0.3 10*3/uL (ref 0.0–0.7)
Eosinophils Relative: 6.6 % — ABNORMAL HIGH (ref 0.0–5.0)
HCT: 37.2 % (ref 36.0–46.0)
Hemoglobin: 12.2 g/dL (ref 12.0–15.0)
Lymphocytes Relative: 33.1 % (ref 12.0–46.0)
Lymphs Abs: 1.7 10*3/uL (ref 0.7–4.0)
MCHC: 32.7 g/dL (ref 30.0–36.0)
MCV: 91.9 fl (ref 78.0–100.0)
Monocytes Absolute: 0.5 10*3/uL (ref 0.1–1.0)
Monocytes Relative: 9.7 % (ref 3.0–12.0)
Neutro Abs: 2.6 10*3/uL (ref 1.4–7.7)
Neutrophils Relative %: 49.4 % (ref 43.0–77.0)
Platelets: 213 10*3/uL (ref 150.0–400.0)
RBC: 4.05 Mil/uL (ref 3.87–5.11)
RDW: 13 % (ref 11.5–15.5)
WBC: 5.2 10*3/uL (ref 4.0–10.5)

## 2021-09-08 LAB — IBC + FERRITIN
Ferritin: 91.6 ng/mL (ref 10.0–291.0)
Iron: 85 ug/dL (ref 42–145)
Saturation Ratios: 28.5 % (ref 20.0–50.0)
TIBC: 298.2 ug/dL (ref 250.0–450.0)
Transferrin: 213 mg/dL (ref 212.0–360.0)

## 2021-09-08 LAB — TSH: TSH: 0.89 u[IU]/mL (ref 0.35–5.50)

## 2021-09-08 LAB — VITAMIN D 25 HYDROXY (VIT D DEFICIENCY, FRACTURES): VITD: 44.68 ng/mL (ref 30.00–100.00)

## 2021-09-08 LAB — C-REACTIVE PROTEIN: CRP: <1 mg/dL (ref 0.5–20.0)

## 2021-09-08 LAB — SEDIMENTATION RATE: Sed Rate: 11 mm/hr (ref 0–30)

## 2021-09-08 LAB — VITAMIN B12: Vitamin B-12: 428 pg/mL (ref 211–911)

## 2021-09-08 NOTE — Telephone Encounter (Signed)
Spoke to pharmacy   Pt felt no different after stopping Crestor   Would resume   10 mg daily    Check lipids and CK in 8 wks

## 2021-09-14 ENCOUNTER — Ambulatory Visit: Payer: Medicare Other | Admitting: Internal Medicine

## 2021-09-14 DIAGNOSIS — E7849 Other hyperlipidemia: Secondary | ICD-10-CM

## 2021-09-14 DIAGNOSIS — Z79899 Other long term (current) drug therapy: Secondary | ICD-10-CM

## 2021-09-14 DIAGNOSIS — R52 Pain, unspecified: Secondary | ICD-10-CM

## 2021-09-14 MED ORDER — ROSUVASTATIN CALCIUM 10 MG PO TABS: 10.0000 mg | ORAL_TABLET | Freq: Every day | ORAL | 3 refills | Status: DC

## 2021-09-14 NOTE — Telephone Encounter (Signed)
Sent My Chart message to the pt... [facility] until she needs to fill it and labs ordered for 8 weeks... pt to send message back re: a lab date.

## 2021-09-15 ENCOUNTER — Telehealth: Payer: Self-pay | Admitting: Internal Medicine

## 2021-09-15 ENCOUNTER — Encounter: Payer: Self-pay | Admitting: Internal Medicine

## 2021-09-15 ENCOUNTER — Ambulatory Visit (INDEPENDENT_AMBULATORY_CARE_PROVIDER_SITE_OTHER): Payer: Medicare Other | Admitting: Internal Medicine

## 2021-09-15 VITALS — BP 104/62 | HR 89 | Temp 97.9°F | Ht 67.0 in | Wt 169.4 lb

## 2021-09-15 DIAGNOSIS — Z170 Estrogen receptor positive status [ER+]: Secondary | ICD-10-CM | POA: Diagnosis not present

## 2021-09-15 DIAGNOSIS — Z91041 Radiographic dye allergy status: Secondary | ICD-10-CM | POA: Diagnosis not present

## 2021-09-15 DIAGNOSIS — C50411 Malignant neoplasm of upper-outer quadrant of right female breast: Secondary | ICD-10-CM | POA: Diagnosis not present

## 2021-09-15 DIAGNOSIS — R1030 Lower abdominal pain, unspecified: Secondary | ICD-10-CM

## 2021-09-15 DIAGNOSIS — K2270 Barrett's esophagus without dysplasia: Secondary | ICD-10-CM | POA: Diagnosis not present

## 2021-09-15 DIAGNOSIS — R32 Unspecified urinary incontinence: Secondary | ICD-10-CM

## 2021-09-15 LAB — POCT URINALYSIS DIPSTICK
Bilirubin, UA: POSITIVE
Glucose, UA: NEGATIVE
Nitrite, UA: NEGATIVE
Spec Grav, UA: >=1.03 — AB (ref 1.010–1.025)
Urobilinogen, UA: NEGATIVE E.U./dL — AB
pH, UA: 5.5 (ref 5.0–8.0)

## 2021-09-15 MED ORDER — CEPHALEXIN 500 MG PO CAPS: 500.0000 mg | ORAL_CAPSULE | Freq: Three times a day (TID) | ORAL | 0 refills | Status: DC

## 2021-09-15 MED ORDER — CEFDINIR 300 MG PO CAPS: 300.0000 mg | ORAL_CAPSULE | Freq: Two times a day (BID) | ORAL | 0 refills | Status: DC

## 2021-09-15 NOTE — Telephone Encounter (Signed)
I will send in a different antibiotic as we discussed ?Keflex instead of omnicef ?

## 2021-09-15 NOTE — Patient Instructions (Addendum)
Blood work now basically  normal ( insignificant  findings)  ?Treating for UTI  ?Will plan scan of abdomen  in interim .   You should be contact.  about that .  ? ? ? ? ? ? ? ? ? ? ? ? ?

## 2021-09-15 NOTE — Progress Notes (Signed)
? ?Chief Complaint  ?Patient presents with  ? Abdominal Pain  ?  Discuss lab results   ? ? ?HPI: ?Donna Manning 71 y.o. come in for fu    last visit jan 23  lab results   showed mild ane,mia and low iron   on repeat    cbc and iron studies nl as well as b12  ?Also trial off crestor     and to restart as per dr Harrington Challenger.  ? ?New  constant  discomfort  mid and right .  Lower pelvid abd area  no radiation  and no change in bowel habits   ?Onset after cruise     about 5 weeks  constant.   No change with eating .   More urinary leaking  recnetly  but no fever dysuria  ? ?No constipated   uncertain  ? Sx  predated  pain ?  ?ROS: See pertinent positives and negatives per HPI. No fever dysphagia  hematuria noted .   ? ?Past Medical History:  ?Diagnosis Date  ? Atypical lobular hyperplasia [facility]) of left breast 03/2018  ? Barrett's esophagus 2009  ? Benign head tremor   ? Breast cancer ([place])   ? Breast cancer, right breast ([place]) 01/2014  ? "had 5 weeks radiation after lumpectomy" (04/24/2018)  ? Dental bridge present   ? lower left  ? Dental crowns present   ? GERD (gastroesophageal reflux disease)   ? History of angina   ? History of DVT (deep vein thrombosis) ~ 1980  ? after a pregnancy  ? History of radiation therapy 2015  ? Hyperlipidemia   ? Hypothyroidism   ? Irregular heart beat   ? "dx'd by 1 dr; not noted by the cardiologist" (04/24/2018)  ? NSAID-induced gastric ulcer   ? Osteoporosis   ? Personal history of radiation therapy   ? PONV (postoperative nausea and vomiting)   ? ? ?Family History  ?Problem Relation Age of Onset  ? Heart failure Mother 70  ? Diabetes Mother   ? Parkinsonism Father 75  ? Stroke Father   ? Thyroid disease Sister   ? Prostate cancer Brother   ? Stomach cancer Paternal Grandfather   ? Colon polyps Neg Hx   ? Crohn's disease Neg Hx   ? Esophageal cancer Neg Hx   ? Rectal cancer Neg Hx   ? Colon cancer Neg Hx   ? ? ?Social History  ? ?Socioeconomic History  ? Marital status: Married  ?  Spouse  name: Not on file  ? Number of children: 5  ? Years of education: Not on file  ? Highest education level: Not on file  ?Occupational History  ? Occupation: homemaker  ?Tobacco Use  ? Smoking status: Never  ? Smokeless tobacco: Never  ?Vaping Use  ? Vaping Use: Never used  ?Substance and Sexual Activity  ? Alcohol use: Not Currently  ?  Comment: 04/24/2018 "drank a few beers in college"  ? Drug use: Never  ? Sexual activity: Not Currently  ?Other Topics Concern  ? Not on file  ?Social History Narrative  ? Not on file  ? ?Social Determinants of Health  ? ?Financial Resource Strain: Low Risk   ? Difficulty of Paying Living Expenses: Not hard at all  ?Food Insecurity: No Food Insecurity  ? Worried About Charity fundraiser in the Last Year: Never true  ? Ran Out of Food in the Last Year: Never true  ?Transportation Needs: No  Transportation Needs  ? Lack of Transportation (Medical): No  ? Lack of Transportation (Non-Medical): No  ?Physical Activity: Insufficiently Active  ? Days of Exercise per Week: 5 days  ? Minutes of Exercise per Session: 20 min  ?Stress: No Stress Concern Present  ? Feeling of Stress : Not at all  ?Social Connections: Socially Integrated  ? Frequency of Communication with Friends and Family: More than three times a week  ? Frequency of Social Gatherings with Friends and Family: More than three times a week  ? Attends Religious Services: 1 to 4 times per year  ? Active Member of Clubs or Organizations: Yes  ? Attends Archivist Meetings: 1 to 4 times per year  ? Marital Status: Married  ? ? ?Outpatient Medications Prior to Visit  ?Medication Sig Dispense Refill  ? calcium carbonate (OS-CAL - DOSED IN MG OF ELEMENTAL CALCIUM) 1250 (500 Ca) MG tablet Take 1 tablet by mouth. Takes every other day    ? levothyroxine (SYNTHROID) 88 MCG tablet Take 1 tablet (88 mcg total) by mouth daily. 90 tablet 3  ? Multiple Vitamins-Minerals (MULTIVITAMIN ADULTS 50+ PO) Take 1 tablet by mouth daily.    ?  omeprazole (PRILOSEC) 40 MG capsule Take 40 mg by mouth daily.    ? rosuvastatin (CRESTOR) 10 MG tablet Take 1 tablet (10 mg total) by mouth daily. 90 tablet 3  ? tamoxifen (NOLVADEX) 20 MG tablet TAKE 1 TABLET BY MOUTH EVERY DAY 90 tablet 3  ? ?No facility-administered medications prior to visit.  ? ? ? ?EXAM: ? ?BP 104/62 (BP Location: Left Arm, Patient Position: Sitting, Cuff Size: Normal)   Pulse 89   Temp 97.9 ?F (36.6 ?C) (Oral)   Ht 5\' 7"  (1.702 m)   Wt 169 lb 6.4 oz (76.8 kg)   SpO2 98%   BMI 26.53 kg/m?  ? ?Body mass index is 26.53 kg/m?. ? ?GENERAL: vitals reviewed and listed above, alert, oriented, appears well hydrated and in no acute distress ?HEENT: atraumatic, conjunctiva  clear, no obvious abnormalities on inspection of external nose and ears OP : nmasked   ?NECK: no obvious masses on inspection palpation  ?CV: HRRR, no clubbing cyanosis or  peripheral edema nl cap refill  ?Abdomen:  Sof,t normal bowel sounds without hepatosplenomegaly, no guarding rebound or masses no CVA tenderness  area or tenderness right  of midline lower abd peliv area no mass G or r noted  ? ?MS: moves all extremities without noticeable focal  abnormality ?PSYCH: pleasant and cooperative, no obvious depression or anxiety ?Lab Results  ?Component Value Date  ? WBC 5.2 09/08/2021  ? HGB 12.2 09/08/2021  ? HCT 37.2 09/08/2021  ? PLT 213.0 09/08/2021  ? GLUCOSE 100 (H) 07/21/2021  ? CHOL 171 07/21/2021  ? TRIG 112.0 07/21/2021  ? HDL 47.90 07/21/2021  ? LDLDIRECT 147.2 11/23/2011  ? LDLCALC 101 (H) 07/21/2021  ? ALT 18 07/21/2021  ? AST 23 07/21/2021  ? NA 139 07/21/2021  ? K 3.9 07/21/2021  ? CL 104 07/21/2021  ? CREATININE 0.81 07/21/2021  ? BUN 13 07/21/2021  ? CO2 27 07/21/2021  ? TSH 0.89 09/08/2021  ? HGBA1C 6.2 07/21/2021  ? ?BP Readings from Last 3 Encounters:  ?09/15/21 104/62  ?07/21/21 120/74  ?05/13/21 (!) 133/56  ?Labs reviewed and last endo colon 2019  ?UA pos leuk and blood  neg nitrites  send for cx   ? ?ASSESSMENT AND PLAN: ? ?Discussed the following assessment and plan: ? ?  Lower abdominal pain - Plan: POC Urinalysis Dipstick, Culture, Urine, CT Abdomen Pelvis Wo Contrast ? ?Urinary incontinence, unspecified type - possible uti aggravated  - Plan: CT Abdomen Pelvis Wo Contrast ? ?Allergy to intravenous contrast - Plan: CT Abdomen Pelvis Wo Contrast ? ?Malignant neoplasm of upper-outer quadrant of right breast in female, estrogen receptor positive ([place]) - Plan: CT Abdomen Pelvis Wo Contrast ? ?Barrett's esophagus without dysplasia ?Anemia resolbed no diabets but a1c 6.1  ?Thyroid in range  ( tsh below 1 .   ? ?New sx   constant lower abd pain ? Pelvic just r of midline     and inc UI recently    empiric rx for  uti pending  u cx   ?Wants to proceed with  further work up  ?Had recent weigh toss of uncertain cause .  Previously .  ?Under  gi surveillance for barrett's and esophageal  ulcers in past .  ?No change in bowel habits  ?Has gyne dr Valentino Saxon seen every 2 years  . No bleeding on tamoxifen  if undetermined cause  plan gyne  check also  ? Pelvic tv ultrasound? And ivolve GI as indicated  ?-Patient advised to return or notify health care team  if  new concerns arise. ? ?Patient Instructions  ?Blood work now basically  normal ( insignificant  findings)  ?Treating for UTI  ?Will plan scan of abdomen  in interim .   You should be contact.  about that .  ? ? ? ? ? ? ? ? ? ? ? ?[name]. Special Ranes M.D. ?

## 2021-09-15 NOTE — Telephone Encounter (Signed)
Patient called in stating that cefdinir (OMNICEF) 300 MG capsule [[id number]]  is too high for her to afford. ? ?Please advise. ?

## 2021-09-15 NOTE — Telephone Encounter (Signed)
Pt informed and verbalized understanding

## 2021-09-17 LAB — URINE CULTURE
MICRO NUMBER:: 13073868
SPECIMEN QUALITY:: ADEQUATE

## 2021-09-19 ENCOUNTER — Other Ambulatory Visit: Payer: Self-pay | Admitting: Gastroenterology

## 2021-09-20 NOTE — Progress Notes (Signed)
Urine culture shows confirmation of UTI .   Antibiotic given should adequately treat this infection.   Hopefully  symptoms with get better But you should be notified about ct scan  appt ( without iv contrast)  also

## 2021-09-22 ENCOUNTER — Other Ambulatory Visit: Payer: Self-pay

## 2021-09-22 ENCOUNTER — Telehealth: Payer: Self-pay | Admitting: Internal Medicine

## 2021-09-22 MED ORDER — FLUCONAZOLE 150 MG PO TABS: 150.0000 mg | ORAL_TABLET | Freq: Once | ORAL | 0 refills | Status: AC

## 2021-09-22 NOTE — Telephone Encounter (Signed)
Patient last Ov 09/15/21 ?Please advise ?

## 2021-09-22 NOTE — Telephone Encounter (Signed)
Pt has finished abx and now having vaginal itching in external area and thinks she may have yeast infection and would like cream or something send into  ?[id number] [place] [place]:  [phone] ?Fax:  [phone] ?  ? ?

## 2021-09-22 NOTE — Telephone Encounter (Signed)
Please send in diflucan 150 mg take one po x 1  and she can also use OTC miconazole vaginal cream or supp for  3 days .

## 2021-09-23 ENCOUNTER — Other Ambulatory Visit: Payer: Self-pay

## 2021-09-23 MED ORDER — FLUCONAZOLE 150 MG PO TABS: 150.0000 mg | ORAL_TABLET | Freq: Every day | ORAL | 0 refills | Status: DC

## 2021-09-23 NOTE — Progress Notes (Signed)
Error/ Duplicate

## 2021-09-23 NOTE — Telephone Encounter (Signed)
Patient informed of message and will pick up miconazole vaginal cream from pharmacy  ?

## 2021-10-04 NOTE — Telephone Encounter (Signed)
Pt to have repeat labs 12/01/21 fasting.  ?

## 2021-10-07 ENCOUNTER — Ambulatory Visit
Admission: RE | Admit: 2021-10-07 | Discharge: 2021-10-07 | Disposition: A | Discharge status: Home / Self Care | Payer: Medicare Other | Source: Ambulatory Visit | Attending: Internal Medicine | Admitting: Internal Medicine

## 2021-10-07 ENCOUNTER — Other Ambulatory Visit: Payer: Self-pay

## 2021-10-07 DIAGNOSIS — R32 Unspecified urinary incontinence: Secondary | ICD-10-CM

## 2021-10-07 DIAGNOSIS — Z91041 Radiographic dye allergy status: Secondary | ICD-10-CM

## 2021-10-07 DIAGNOSIS — R109 Unspecified abdominal pain: Secondary | ICD-10-CM | POA: Diagnosis not present

## 2021-10-07 DIAGNOSIS — K5730 Diverticulosis of large intestine without perforation or abscess without bleeding: Secondary | ICD-10-CM | POA: Diagnosis not present

## 2021-10-07 DIAGNOSIS — R1030 Lower abdominal pain, unspecified: Secondary | ICD-10-CM

## 2021-10-07 DIAGNOSIS — K760 Fatty (change of) liver, not elsewhere classified: Secondary | ICD-10-CM | POA: Diagnosis not present

## 2021-10-07 DIAGNOSIS — K8020 Calculus of gallbladder without cholecystitis without obstruction: Secondary | ICD-10-CM | POA: Diagnosis not present

## 2021-10-07 DIAGNOSIS — C50411 Malignant neoplasm of upper-outer quadrant of right female breast: Secondary | ICD-10-CM

## 2021-10-10 NOTE — Progress Notes (Signed)
Ct  shows  some gall stones  and some fat in the liver . No obvious cause of sx  although I suppose the gall stones could cause some right sided sx .  If  on going  pain  make appt virtual ok to discuss.

## 2021-10-13 ENCOUNTER — Telehealth (INDEPENDENT_AMBULATORY_CARE_PROVIDER_SITE_OTHER): Payer: Medicare Other | Admitting: Internal Medicine

## 2021-10-13 DIAGNOSIS — K2270 Barrett's esophagus without dysplasia: Secondary | ICD-10-CM

## 2021-10-13 DIAGNOSIS — R1030 Lower abdominal pain, unspecified: Secondary | ICD-10-CM | POA: Diagnosis not present

## 2021-10-13 NOTE — Progress Notes (Signed)
?Virtual Visit via Video Note ? ?I connected with Donna Manning on 10/13/21 at  9:30 AM EDT by a video enabled telemedicine application and verified that I am speaking with the correct person using two identifiers. ?Location patient: home ?Location provider:work  office ?Persons participating in the virtual visit: patient, provider ?Quality of video was t impaired frequent freezing but we did finish the visitwith audio and video ?[place] national recommendations  regarding COVID 19 pandemic   video visit is advised over in office visit for this patient.  ?Patient aware  of the limitations of evaluation and management by telemedicine and  availability of in person appointments. and agreed to proceed. ? ? ?HPI: ?Donna Manning presents for video visit to discuss results of her CT scan ?She continues to have right lower quadrant sharp transient pain most commonly after she is laying down a while but can happen anytime it is not worse and not associated with change in bowel habits eating position movement. ?We treated her for UTI and she noted no difference. ?Only other new thing was a severe episode of reflux 1 evening that made her throat sore the next day not associated with dysphagia vomiting or triggers. ? ?She is up-to-date on her colonoscopy although is supposed to have upper endoscopy every couple years last one done 06/24/2020 last colon 03/13/2020.  Polyp surveillance advised every 3 to 5 years. ? ?ROS: See pertinent positives and negatives per HPI. ? ?Past Medical History:  ?Diagnosis Date  ? Atypical lobular hyperplasia [facility]) of left breast 03/2018  ? Barrett's esophagus 2009  ? Benign head tremor   ? Breast cancer ([place])   ? Breast cancer, right breast ([place]) 01/2014  ? "had 5 weeks radiation after lumpectomy" (04/24/2018)  ? Dental bridge present   ? lower left  ? Dental crowns present   ? GERD (gastroesophageal reflux disease)   ? History of angina   ? History of DVT (deep vein thrombosis) ~ 1980  ?  after a pregnancy  ? History of radiation therapy 2015  ? Hyperlipidemia   ? Hypothyroidism   ? Irregular heart beat   ? "dx'd by 1 dr; not noted by the cardiologist" (04/24/2018)  ? NSAID-induced gastric ulcer   ? Osteoporosis   ? Personal history of radiation therapy   ? PONV (postoperative nausea and vomiting)   ? ? ?Past Surgical History:  ?Procedure Laterality Date  ? BREAST BIOPSY Right 1991  ? BREAST EXCISIONAL BIOPSY Left 2019  ? BREAST LUMPECTOMY Right 1991  ? BREAST LUMPECTOMY W/ NEEDLE LOCALIZATION Left 04/24/2018  ? BREAST LUMPECTOMY WITH NEEDLE LOCALIZATION Left 04/24/2018  ? Procedure: LEFT BREAST LUMPECTOMY WITH NEEDLE LOCALIZATION;  Surgeon: [name], MD;  Location: [place];  Service: General;  Laterality: Left;  ? COLONOSCOPY    ? "w/hemorrhoid banding"  ? COLOSTOMY  2009  ? PARTIAL MASTECTOMY WITH NEEDLE LOCALIZATION AND AXILLARY SENTINEL LYMPH NODE BX Right 01/20/2014  ? Procedure: RIGHT PARTIAL MASTECTOMY WITH DOUBLE  NEEDLE LOCALIZATION (BRACKETED )AND AXILLARY SENTINEL LYMPH NODE BX;  Surgeon: [name], MD;  Location: [place];  Service: General;  Laterality: Right;  And [place].  ? RE-EXCISION OF BREAST CANCER,SUPERIOR MARGINS Right 02/05/2014  ? Procedure: RIGHT LUMPECTOMY, RE-EXCISION OF BREAST CANCER,SUPERIOR MARGINS;  Surgeon: [name], MD;  Location: [place];  Service: General;  Laterality: Right;  ? UPPER GASTROINTESTINAL ENDOSCOPY  2016  ? UPPER GI ENDOSCOPY  12/25/2014  ? with Propofol  ? ? ?  Family History  ?Problem Relation Age of Onset  ? Heart failure Mother 68  ? Diabetes Mother   ? Parkinsonism Father 60  ? Stroke Father   ? Thyroid disease Sister   ? Prostate cancer Brother   ? Stomach cancer Paternal Grandfather   ? Colon polyps Neg Hx   ? Crohn's disease Neg Hx   ? Esophageal cancer Neg Hx   ? Rectal cancer Neg Hx   ? Colon cancer Neg Hx   ? ? ?Social History  ? ?Tobacco Use  ? Smoking status: Never  ?  Smokeless tobacco: Never  ?Vaping Use  ? Vaping Use: Never used  ?Substance Use Topics  ? Alcohol use: Not Currently  ?  Comment: 04/24/2018 "drank a few beers in college"  ? Drug use: Never  ? ? ? ? ?Current Outpatient Medications:  ?  calcium carbonate (OS-CAL - DOSED IN MG OF ELEMENTAL CALCIUM) 1250 (500 Ca) MG tablet, Take 1 tablet by mouth. Takes every other day, Disp: , Rfl:  ?  cephALEXin (KEFLEX) 500 MG capsule, Take 1 capsule (500 mg total) by mouth 3 (three) times daily. UTI, Disp: 21 capsule, Rfl: 0 ?  fluconazole (DIFLUCAN) 150 MG tablet, Take 1 tablet (150 mg total) by mouth daily., Disp: 1 tablet, Rfl: 0 ?  levothyroxine (SYNTHROID) 88 MCG tablet, Take 1 tablet (88 mcg total) by mouth daily., Disp: 90 tablet, Rfl: 3 ?  Multiple Vitamins-Minerals (MULTIVITAMIN ADULTS 50+ PO), Take 1 tablet by mouth daily., Disp: , Rfl:  ?  omeprazole (PRILOSEC) 40 MG capsule, TAKE 1 CAPSULE BY MOUTH TWICE A DAY, Disp: 180 capsule, Rfl: 3 ?  rosuvastatin (CRESTOR) 10 MG tablet, Take 1 tablet (10 mg total) by mouth daily., Disp: 90 tablet, Rfl: 3 ?  tamoxifen (NOLVADEX) 20 MG tablet, TAKE 1 TABLET BY MOUTH EVERY DAY, Disp: 90 tablet, Rfl: 3 ? ?EXAM: ?BP Readings from Last 3 Encounters:  ?09/15/21 104/62  ?07/21/21 120/74  ?05/13/21 (!) 133/56  ? ? ?GENERAL: alert, oriented, appears well and in no acute distress ?HEENT: atraumatic, conjunttiva clear, no obvious abnormalities on inspection of external nose and ears ?NECK: normal movements of the head and neck ? ?LUNGS: on inspection no signs of respiratory distress, breathing rate appears normal, no obvious gross SOB, gasping or wheezing ?CV: no obvious cyanosis ? ?PSYCH/NEURO: pleasant and cooperative, no obvious depression or anxiety, speech and thought processing grossly intact ?Lab Results  ?Component Value Date  ? WBC 5.2 09/08/2021  ? HGB 12.2 09/08/2021  ? HCT 37.2 09/08/2021  ? PLT 213.0 09/08/2021  ? GLUCOSE 100 (H) 07/21/2021  ? CHOL 171 07/21/2021  ? TRIG 112.0  07/21/2021  ? HDL 47.90 07/21/2021  ? LDLDIRECT 147.2 11/23/2011  ? LDLCALC 101 (H) 07/21/2021  ? ALT 18 07/21/2021  ? AST 23 07/21/2021  ? NA 139 07/21/2021  ? K 3.9 07/21/2021  ? CL 104 07/21/2021  ? CREATININE 0.81 07/21/2021  ? BUN 13 07/21/2021  ? CO2 27 07/21/2021  ? TSH 0.89 09/08/2021  ? HGBA1C 6.2 07/21/2021  ? ?FINDINGS: ?Lower chest: No acute abnormality. ?  ?Hepatobiliary: Mild cholelithiasis is noted. No biliary dilatation ?is noted. Hepatic steatosis. ?  ?Pancreas: Unremarkable. No pancreatic ductal dilatation or ?surrounding inflammatory changes. ?  ?Spleen: Normal in size without focal abnormality. ?  ?Adrenals/Urinary Tract: Adrenal glands appear normal. Right renal ?cyst is noted. No hydronephrosis or renal obstruction is noted. ?Urinary bladder is unremarkable. ?  ?Stomach/Bowel: Stomach is within normal limits. Appendix  appears ?normal. No evidence of bowel wall thickening, distention, or ?inflammatory changes. Sigmoid diverticulosis without inflammation. ?  ?Vascular/Lymphatic: No significant vascular findings are present. No ?enlarged abdominal or pelvic lymph nodes. ?  ?Reproductive: Uterus and bilateral adnexa are unremarkable. ?  ?Other: No abdominal wall hernia or abnormality. No abdominopelvic ?ascites. ?  ?Musculoskeletal: No acute or significant osseous findings. ?  ?IMPRESSION: ?Hepatic steatosis. ?  ?Mild cholelithiasis. ?  ?Sigmoid diverticulosis without inflammation. ?  ?  ?Electronically Signed ?  By: Marijo Conception M.D. ?  On: 10/10/2021 11:03 ?ASSESSMENT AND PLAN: ? ?Discussed the following assessment and plan: ? ?  ICD-10-CM   ?1. Lower abdominal pain  R10.30   ?  ?2. Barrett's esophagus without dysplasia  K22.70   ? one episode of  nocturnal  reflux   ?  ?CT scan findings reviewed do not think the renal cyst is significant although it was not described most are benign. ?No evidence of symptoms consistent with cholelithiasis. ?Pain is could be consistent with nerve or scar  tissue but is not associated with moving and no other associated symptoms. ?We will follow at this time options discussed ?She can make a follow-up appointment with Dr. Nori Riis and ask her advice also is when sh

## 2021-12-01 ENCOUNTER — Other Ambulatory Visit: Payer: Medicare Other

## 2021-12-01 DIAGNOSIS — R52 Pain, unspecified: Secondary | ICD-10-CM | POA: Diagnosis not present

## 2021-12-01 DIAGNOSIS — Z79899 Other long term (current) drug therapy: Secondary | ICD-10-CM

## 2021-12-01 DIAGNOSIS — E7849 Other hyperlipidemia: Secondary | ICD-10-CM

## 2021-12-01 LAB — LIPID PANEL
Chol/HDL Ratio: 2.1 ratio (ref 0.0–4.4)
Cholesterol, Total: 122 mg/dL (ref 100–199)
HDL: 58 mg/dL (ref >39)
LDL Chol Calc (NIH): 44 mg/dL (ref 0–99)
Triglycerides: 113 mg/dL (ref 0–149)
VLDL Cholesterol Cal: 20 mg/dL (ref 5–40)

## 2021-12-01 LAB — CK: Total CK: 47 U/L (ref 32–182)

## 2021-12-28 ENCOUNTER — Telehealth: Payer: Self-pay | Admitting: Pharmacist

## 2021-12-28 NOTE — Chronic Care Management (AMB) (Signed)
    Chronic Care Management Pharmacy Assistant   Name: Donna Manning  MRN: [id number] DOB: September 23, 1950  Reason for Encounter: Disease State   Conditions to be addressed/monitored: General Assessment  Recent office visits:  10/13/21 Burnis Medin, MD - Patient presented via video for Lower abdominal pain and other concerns. No medication changes.  09/15/21  [name], [name], MD - Patient presented for Lower abdominal pain and other concerns. Prescribed Cefdinir.   Recent consult visits:  None  Hospital visits:  None in previous 6 months  Medications: Outpatient Encounter Medications as of 12/28/2021  Medication Sig   calcium carbonate (OS-CAL - DOSED IN MG OF ELEMENTAL CALCIUM) 1250 (500 Ca) MG tablet Take 1 tablet by mouth. Takes every other day   cephALEXin (KEFLEX) 500 MG capsule Take 1 capsule (500 mg total) by mouth 3 (three) times daily. UTI   fluconazole (DIFLUCAN) 150 MG tablet Take 1 tablet (150 mg total) by mouth daily.   levothyroxine (SYNTHROID) 88 MCG tablet Take 1 tablet (88 mcg total) by mouth daily.   Multiple Vitamins-Minerals (MULTIVITAMIN ADULTS 50+ PO) Take 1 tablet by mouth daily.   omeprazole (PRILOSEC) 40 MG capsule TAKE 1 CAPSULE BY MOUTH TWICE A DAY   rosuvastatin (CRESTOR) 10 MG tablet Take 1 tablet (10 mg total) by mouth daily.   tamoxifen (NOLVADEX) 20 MG tablet TAKE 1 TABLET BY MOUTH EVERY DAY   No facility-administered encounter medications on file as of 12/28/2021.  [place] for General Review Call   Chart Review:  Have there been any documented new, changed, or discontinued medications since last visit? No  Has there been any documented recent hospitalizations or ED visits since last visit with Clinical Pharmacist? No    Adherence Review:  Does the Clinical Pharmacist Assistant have access to adherence rates? Yes Adherence rates for STAR metric medications  Rosuvastatin 10 mg  [facility] Does the patient have >5 day gap between last estimated fill dates for any of the above medications or other medication gaps? No    Disease State Questions:  Able to connect with Patient? No  Unable to reach patient for assessment   Care Gaps: TDAP - Overdue CCM- 1/24 AWV- 10/22  Star Rating Drugs: Rosuvastatin 10 mg -  Last filled 12/18/21 90 DS at [place] Pharmacist Assistant [phone]

## 2022-01-27 DIAGNOSIS — H04123 Dry eye syndrome of bilateral lacrimal glands: Secondary | ICD-10-CM | POA: Diagnosis not present

## 2022-01-27 DIAGNOSIS — H52203 Unspecified astigmatism, bilateral: Secondary | ICD-10-CM | POA: Diagnosis not present

## 2022-01-27 DIAGNOSIS — H25813 Combined forms of age-related cataract, bilateral: Secondary | ICD-10-CM | POA: Diagnosis not present

## 2022-01-27 DIAGNOSIS — H18593 Other hereditary corneal dystrophies, bilateral: Secondary | ICD-10-CM | POA: Diagnosis not present

## 2022-02-21 ENCOUNTER — Telehealth: Payer: Self-pay | Admitting: Pharmacist

## 2022-02-21 NOTE — Chronic Care Management (AMB) (Signed)
    Chronic Care Management Pharmacy Assistant   Name: LUVIA ORZECHOWSKI  MRN: [id number] DOB: [birth year]  Reason for Encounter: Disease State   Conditions to be addressed/monitored: General Assessment  Recent office visits:  10/13/21 Burnis Medin, MD - Patient presented via video for Lower abdominal pain and other concerns. No medication changes.   09/15/21  [name], [name], MD - Patient presented for Lower abdominal pain and other concerns. Prescribed Cefdinir.   Recent consult visits:  None  Hospital visits:  None in previous 6 months  Medications: Outpatient Encounter Medications as of 02/21/2022  Medication Sig   calcium carbonate (OS-CAL - DOSED IN MG OF ELEMENTAL CALCIUM) 1250 (500 Ca) MG tablet Take 1 tablet by mouth. Takes every other day   cephALEXin (KEFLEX) 500 MG capsule Take 1 capsule (500 mg total) by mouth 3 (three) times daily. UTI   fluconazole (DIFLUCAN) 150 MG tablet Take 1 tablet (150 mg total) by mouth daily.   levothyroxine (SYNTHROID) 88 MCG tablet Take 1 tablet (88 mcg total) by mouth daily.   Multiple Vitamins-Minerals (MULTIVITAMIN ADULTS 50+ PO) Take 1 tablet by mouth daily.   omeprazole (PRILOSEC) 40 MG capsule TAKE 1 CAPSULE BY MOUTH TWICE A DAY   rosuvastatin (CRESTOR) 10 MG tablet Take 1 tablet (10 mg total) by mouth daily.   tamoxifen (NOLVADEX) 20 MG tablet TAKE 1 TABLET BY MOUTH EVERY DAY   No facility-administered encounter medications on file as of 02/21/2022.  [place] for General Review Call   Chart Review:  Have there been any documented new, changed, or discontinued medications since last visit? No  Has there been any documented recent hospitalizations or ED visits since last visit with Clinical Pharmacist? No    Adherence Review:  Does the Clinical Pharmacist Assistant have access to adherence rates? Yes  Adherence rates for STAR metric medications  Rosuvastatin 10 mg  [facility]  Does the patient have >5 day gap between last estimated fill dates for any of the above medications or other medication gaps? No  Disease State Questions:  Able to connect with Patient? No  Care Gaps: TDAP - Overdue CCM- 1/24 AWV- 10/22  Star Rating Drugs: Rosuvastatin 10 mg -  Last filled 12/18/21 90 DS at [place] Pharmacist Assistant [phone]

## 2022-03-04 DIAGNOSIS — H10413 Chronic giant papillary conjunctivitis, bilateral: Secondary | ICD-10-CM | POA: Diagnosis not present

## 2022-03-04 DIAGNOSIS — H18593 Other hereditary corneal dystrophies, bilateral: Secondary | ICD-10-CM | POA: Diagnosis not present

## 2022-04-14 DIAGNOSIS — Z23 Encounter for immunization: Secondary | ICD-10-CM | POA: Diagnosis not present

## 2022-04-28 ENCOUNTER — Other Ambulatory Visit: Payer: Self-pay | Admitting: Hematology and Oncology

## 2022-04-28 DIAGNOSIS — Z1231 Encounter for screening mammogram for malignant neoplasm of breast: Secondary | ICD-10-CM

## 2022-04-28 DIAGNOSIS — Z23 Encounter for immunization: Secondary | ICD-10-CM | POA: Diagnosis not present

## 2022-05-03 ENCOUNTER — Ambulatory Visit (INDEPENDENT_AMBULATORY_CARE_PROVIDER_SITE_OTHER): Payer: Medicare Other

## 2022-05-03 ENCOUNTER — Ambulatory Visit: Payer: Medicare Other

## 2022-05-03 VITALS — BP 108/50 | HR 76 | Temp 98.2°F | Ht 66.5 in | Wt 169.0 lb

## 2022-05-03 DIAGNOSIS — Z0000 Encounter for general adult medical examination without abnormal findings: Secondary | ICD-10-CM

## 2022-05-03 NOTE — Patient Instructions (Signed)
Donna Manning , Thank you for taking time to come for your Medicare Wellness Visit. I appreciate your ongoing commitment to your health goals. Please review the following plan we discussed and let me know if I can assist you in the future.   Screening recommendations/referrals: Colonoscopy: completed 03/13/2020, due 03/14/2023 Mammogram: scheduled for 06/08/2022 Bone Density: completed 12/16/2020 Recommended yearly ophthalmology/optometry visit for glaucoma screening and checkup Recommended yearly dental visit for hygiene and checkup  Vaccinations: Influenza vaccine: completed per patient Pneumococcal vaccine: completed 06/04/2018 Tdap vaccine: due Shingles vaccine: completed   Covid-19: 04/14/2022, 05/22/2021, 12/10/2020, 05/18/2020, 08/28/2019, 07/31/2019  Advanced directives: Advance directive discussed with you today. Even though you declined this today please call our office should you change your mind and we can give you the proper paperwork for you to fill out.  Conditions/risks identified: none  Next appointment: Follow up in one year for your annual wellness visit    Preventive Care 65 Years and Older, Female Preventive care refers to lifestyle choices and visits with your health care provider that can promote health and wellness. What does preventive care include? A yearly physical exam. This is also called an annual well check. Dental exams once or twice a year. Routine eye exams. Ask your health care provider how often you should have your eyes checked. Personal lifestyle choices, including: Daily care of your teeth and gums. Regular physical activity. Eating a healthy diet. Avoiding tobacco and drug use. Limiting alcohol use. Practicing safe sex. Taking low-dose aspirin every day. Taking vitamin and mineral supplements as recommended by your health care provider. What happens during an annual well check? The services and screenings done by your health care provider during  your annual well check will depend on your age, overall health, lifestyle risk factors, and family history of disease. Counseling  Your health care provider may ask you questions about your: Alcohol use. Tobacco use. Drug use. Emotional well-being. Home and relationship well-being. Sexual activity. Eating habits. History of falls. Memory and ability to understand (cognition). Work and work Statistician. Reproductive health. Screening  You may have the following tests or measurements: Height, weight, and BMI. Blood pressure. Lipid and cholesterol levels. These may be checked every 5 years, or more frequently if you are over 58 years old. Skin check. Lung cancer screening. You may have this screening every year starting at age 51 if you have a 30-pack-year history of smoking and currently smoke or have quit within the past 15 years. Fecal occult blood test (FOBT) of the stool. You may have this test every year starting at age 20. Flexible sigmoidoscopy or colonoscopy. You may have a sigmoidoscopy every 5 years or a colonoscopy every 10 years starting at age 24. Hepatitis C blood test. Hepatitis B blood test. Sexually transmitted disease (STD) testing. Diabetes screening. This is done by checking your blood sugar (glucose) after you have not eaten for a while (fasting). You may have this done every 1-3 years. Bone density scan. This is done to screen for osteoporosis. You may have this done starting at age 16. Mammogram. This may be done every 1-2 years. Talk to your health care provider about how often you should have regular mammograms. Talk with your health care provider about your test results, treatment options, and if necessary, the need for more tests. Vaccines  Your health care provider may recommend certain vaccines, such as: Influenza vaccine. This is recommended every year. Tetanus, diphtheria, and acellular pertussis (Tdap, Td) vaccine. You may need a Td  booster every 10  years. Zoster vaccine. You may need this after age 67. Pneumococcal 13-valent conjugate (PCV13) vaccine. One dose is recommended after age 42. Pneumococcal polysaccharide (PPSV23) vaccine. One dose is recommended after age 39. Talk to your health care provider about which screenings and vaccines you need and how often you need them. This information is not intended to replace advice given to you by your health care provider. Make sure you discuss any questions you have with your health care provider. Document Released: 07/31/2015 Document Revised: 03/23/2016 Document Reviewed: 05/05/2015 Elsevier Interactive Patient Education  2017 [place] Prevention in the Home Falls can cause injuries. They can happen to people of all ages. There are many things you can do to make your home safe and to help prevent falls. What can I do on the outside of my home? Regularly fix the edges of walkways and driveways and fix any cracks. Remove anything that might make you trip as you walk through a door, such as a raised step or threshold. Trim any bushes or trees on the path to your home. Use bright outdoor lighting. Clear any walking paths of anything that might make someone trip, such as rocks or tools. Regularly check to see if handrails are loose or broken. Make sure that both sides of any steps have handrails. Any raised decks and porches should have guardrails on the edges. Have any leaves, snow, or ice cleared regularly. Use sand or salt on walking paths during winter. Clean up any spills in your garage right away. This includes oil or grease spills. What can I do in the bathroom? Use night lights. Install grab bars by the toilet and in the tub and shower. Do not use towel bars as grab bars. Use non-skid mats or decals in the tub or shower. If you need to sit down in the shower, use a plastic, non-slip stool. Keep the floor dry. Clean up any water that spills on the floor as soon as it  happens. Remove soap buildup in the tub or shower regularly. Attach bath mats securely with double-sided non-slip rug tape. Do not have throw rugs and other things on the floor that can make you trip. What can I do in the bedroom? Use night lights. Make sure that you have a light by your bed that is easy to reach. Do not use any sheets or blankets that are too big for your bed. They should not hang down onto the floor. Have a firm chair that has side arms. You can use this for support while you get dressed. Do not have throw rugs and other things on the floor that can make you trip. What can I do in the kitchen? Clean up any spills right away. Avoid walking on wet floors. Keep items that you use a lot in easy-to-reach places. If you need to reach something above you, use a strong step stool that has a grab bar. Keep electrical cords out of the way. Do not use floor polish or wax that makes floors slippery. If you must use wax, use non-skid floor wax. Do not have throw rugs and other things on the floor that can make you trip. What can I do with my stairs? Do not leave any items on the stairs. Make sure that there are handrails on both sides of the stairs and use them. Fix handrails that are broken or loose. Make sure that handrails are as long as the stairways. Check any carpeting  to make sure that it is firmly attached to the stairs. Fix any carpet that is loose or worn. Avoid having throw rugs at the top or bottom of the stairs. If you do have throw rugs, attach them to the floor with carpet tape. Make sure that you have a light switch at the top of the stairs and the bottom of the stairs. If you do not have them, ask someone to add them for you. What else can I do to help prevent falls? Wear shoes that: Do not have high heels. Have rubber bottoms. Are comfortable and fit you well. Are closed at the toe. Do not wear sandals. If you use a stepladder: Make sure that it is fully opened.  Do not climb a closed stepladder. Make sure that both sides of the stepladder are locked into place. Ask someone to hold it for you, if possible. Clearly mark and make sure that you can see: Any grab bars or handrails. First and last steps. Where the edge of each step is. Use tools that help you move around (mobility aids) if they are needed. These include: Canes. Walkers. Scooters. Crutches. Turn on the lights when you go into a dark area. Replace any light bulbs as soon as they burn out. Set up your furniture so you have a clear path. Avoid moving your furniture around. If any of your floors are uneven, fix them. If there are any pets around you, be aware of where they are. Review your medicines with your doctor. Some medicines can make you feel dizzy. This can increase your chance of falling. Ask your doctor what other things that you can do to help prevent falls. This information is not intended to replace advice given to you by your health care provider. Make sure you discuss any questions you have with your health care provider. Document Released: 04/30/2009 Document Revised: 12/10/2015 Document Reviewed: 08/08/2014 Elsevier Interactive Patient Education  2017 [employer].

## 2022-05-03 NOTE — Progress Notes (Signed)
Subjective:   Donna Manning is a 71 y.o. female who presents for Medicare Annual (Subsequent) preventive examination.  Review of Systems     Cardiac Risk Factors include: advanced age (>69mn, >>27women);dyslipidemia     Objective:    Today's Vitals   05/03/22 1119  BP: (!) 108/50  Pulse: 76  Temp: 98.2 F (36.8 C)  TempSrc: Oral  SpO2: 96%  Weight: 169 lb (76.7 kg)  Height: 5' 6.5" (1.689 m)   Body mass index is 26.87 kg/m.     05/03/2022   11:30 AM 04/20/2021    2:45 PM 04/15/2020    1:34 PM 04/24/2018   11:19 AM 04/17/2018    2:03 PM 09/08/2016   12:07 PM 09/08/2015    9:23 AM  Advanced Directives  Does Patient Have a Medical Advance Directive? No Yes No No No No Yes  Type of AStatistician Copy of Healthcare Power of Attorney in Chart?  No - copy requested     No - copy requested  Would patient like information on creating a medical advance directive? No - Patient declined  No - Patient declined No - Patient declined No - Patient declined      Current Medications (verified) Outpatient Encounter Medications as of 05/03/2022  Medication Sig   levothyroxine (SYNTHROID) 88 MCG tablet Take 1 tablet (88 mcg total) by mouth daily.   Multiple Vitamins-Minerals (MULTIVITAMIN ADULTS 50+ PO) Take 1 tablet by mouth daily.   omeprazole (PRILOSEC) 40 MG capsule TAKE 1 CAPSULE BY MOUTH TWICE A DAY   rosuvastatin (CRESTOR) 10 MG tablet Take 1 tablet (10 mg total) by mouth daily.   tamoxifen (NOLVADEX) 20 MG tablet TAKE 1 TABLET BY MOUTH EVERY DAY   [DISCONTINUED] calcium carbonate (OS-CAL - DOSED IN MG OF ELEMENTAL CALCIUM) 1250 (500 Ca) MG tablet Take 1 tablet by mouth. Takes every other day (Patient not taking: Reported on 05/03/2022)   [DISCONTINUED] cephALEXin (KEFLEX) 500 MG capsule Take 1 capsule (500 mg total) by mouth 3 (three) times daily. UTI   [DISCONTINUED] fluconazole (DIFLUCAN) 150 MG tablet Take  1 tablet (150 mg total) by mouth daily.   No facility-administered encounter medications on file as of 05/03/2022.    Allergies (verified) Contrast media [iodinated contrast media], Actonel [risedronate sodium], and Starch   History: Past Medical History:  Diagnosis Date   Atypical lobular hyperplasia (ALH) of left breast 03/2018   Barrett's esophagus 2009   Benign head tremor    [place]    [place]   "had 5 weeks radiation after lumpectomy" (04/24/2018)   Dental bridge present    lower left   Dental crowns present    GERD (gastroesophageal reflux disease)    History of angina    History of DVT (deep vein thrombosis) ~ 1980   after a pregnancy   History of radiation therapy 2015   Hyperlipidemia    Hypothyroidism    Irregular heart beat    "dx'd by 1 dr; not noted by the cardiologist" (04/24/2018)   NSAID-induced gastric ulcer    Osteoporosis    Personal history of radiation therapy    PONV (postoperative nausea and vomiting)    Past Surgical History:  Procedure Laterality Date   BREAST BIOPSY Right 1991   BREAST EXCISIONAL BIOPSY Left 2019   BREAST LUMPECTOMY Right 1991   BREAST LUMPECTOMY W/ NEEDLE LOCALIZATION  Left 04/24/2018   BREAST LUMPECTOMY WITH NEEDLE LOCALIZATION Left 04/24/2018   Procedure: LEFT BREAST LUMPECTOMY WITH NEEDLE LOCALIZATION;  Surgeon: [name], MD;  Location: [place];  Service: General;  Laterality: Left;   COLONOSCOPY     "w/hemorrhoid banding"   COLOSTOMY  2009   PARTIAL MASTECTOMY WITH NEEDLE LOCALIZATION AND AXILLARY SENTINEL LYMPH NODE BX Right 01/20/2014   Procedure: RIGHT PARTIAL MASTECTOMY WITH DOUBLE  NEEDLE LOCALIZATION (BRACKETED )AND AXILLARY SENTINEL LYMPH NODE BX;  Surgeon: [name], MD;  Location: [place];  Service: General;  Laterality: Right;  And [place].   RE-EXCISION OF BREAST CANCER,SUPERIOR MARGINS Right 02/05/2014   Procedure: RIGHT  LUMPECTOMY, RE-EXCISION OF BREAST CANCER,SUPERIOR MARGINS;  Surgeon: [name], MD;  Location: [facility];  Service: General;  Laterality: Right;   UPPER GASTROINTESTINAL ENDOSCOPY  2016   UPPER GI ENDOSCOPY  12/25/2014   with Propofol   Family History  Problem Relation Age of Onset   Heart failure Mother 89   Diabetes Mother    Parkinsonism Father 54   Stroke Father    Thyroid disease Sister    Prostate cancer Brother    Stomach cancer Paternal Grandfather    Colon polyps Neg Hx    Crohn's disease Neg Hx    Esophageal cancer Neg Hx    Rectal cancer Neg Hx    Colon cancer Neg Hx    Social History   Socioeconomic History   Marital status: Married    Spouse name: Not on file   Number of children: 5   Years of education: Not on file   Highest education level: Not on file  Occupational History   Occupation: homemaker  Tobacco Use   Smoking status: Never   Smokeless tobacco: Never  Vaping Use   Vaping Use: Never used  Substance and Sexual Activity   Alcohol use: Not Currently    Comment: 04/24/2018 "drank a few beers in college"   Drug use: Never   Sexual activity: Not Currently  Other Topics Concern   Not on file  Social History Narrative   Not on file   Social Determinants of Health   Financial Resource Strain: Low Risk  (05/03/2022)   Overall Financial Resource Strain (CARDIA)    Difficulty of Paying Living Expenses: Not hard at all  Food Insecurity: No Food Insecurity (05/03/2022)   Hunger Vital Sign    Worried About Running Out of Food in the Last Year: Never true    Ran Out of Food in the Last Year: Never true  Transportation Needs: No Transportation Needs (05/03/2022)   PRAPARE - Hydrologist (Medical): No    Lack of Transportation (Non-Medical): No  Physical Activity: Sufficiently Active (05/03/2022)   Exercise Vital Sign    Days of Exercise per Week: 5 days    Minutes of Exercise per Session: 30 min   Stress: No Stress Concern Present (05/03/2022)   [place]    Feeling of Stress : Not at all  Social Connections: Ages (04/20/2021)   Social Connection and Isolation Panel [NHANES]    Frequency of Communication with Friends and Family: More than three times a week    Frequency of Social Gatherings with Friends and Family: More than three times a week    Attends Religious Services: 1 to 4 times per year    Active Member of [employer] or Organizations: Yes  Attends Archivist Meetings: 1 to 4 times per year    Marital Status: Married    Tobacco Counseling Counseling given: Not Answered   Clinical Intake:  Pre-visit preparation completed: Yes  Pain : No/denies pain     Nutritional Status: BMI 25 -29 Overweight Nutritional Risks: None Diabetes: No  How often do you need to have someone help you when you read instructions, pamphlets, or other written materials from your doctor or pharmacy?: 1 - Never What is the last grade level you completed in school?: bachelors degree  Diabetic? no  Interpreter Needed?: No  Information entered by :: NAllen LPN   Activities of Daily Living    05/03/2022   11:30 AM  In your present state of health, do you have any difficulty performing the following activities:  Hearing? 0  Vision? 1  Comment some blurriness at times  Difficulty concentrating or making decisions? 0  Walking or climbing stairs? 0  Dressing or bathing? 0  Doing errands, shopping? 0  Preparing Food and eating ? N  Using the Toilet? N  In the past six months, have you accidently leaked urine? Y  Do you have problems with loss of bowel control? N  Managing your Medications? N  Managing your Finances? N  Housekeeping or managing your Housekeeping? N    Patient Care Team: [name], [name], MD as PCP - General Fay Records, MD as PCP - Cardiology (Cardiology) [name],  MD as Attending Physician (Obstetrics and Gynecology) Sharyne Peach, MD (Ophthalmology) Fanny Skates, MD as Consulting Physician (General Surgery) Thea Silversmith, MD as Consulting Physician (Radiation Oncology) Mauri Pole, MD as Consulting Physician (Gastroenterology) Nicholas Lose, MD as Consulting Physician (Hematology and Oncology) Viona Gilmore, [facility] as Pharmacist (Pharmacist)  Indicate any recent Medical Services you may have received from other than [facility] providers in the past year (date may be approximate).     Assessment:   This is a routine wellness examination for Donna Manning.  Hearing/Vision screen Vision Screening - Comments:: Regular eye exams, [facility]  Dietary issues and exercise activities discussed: Current Exercise Habits: Home exercise routine, Type of exercise: walking, Time (Minutes): 30, Frequency (Times/Week): 5, Weekly Exercise (Minutes/Week): 150   Goals Addressed             This Visit's Progress    Patient Stated       05/03/2022, keep weight under 170 pounds       Depression Screen    05/03/2022   11:30 AM 09/15/2021   10:43 AM 07/21/2021   11:19 AM 04/20/2021    2:42 PM 04/15/2020    1:36 PM 08/05/2019    3:34 PM 06/04/2018    9:44 AM  PHQ 2/9 Scores  PHQ - 2 Score 0 0 0 0 0 0 0  PHQ- 9 Score  3 1        Fall Risk    05/03/2022   11:30 AM 09/15/2021   10:43 AM 07/21/2021   11:18 AM 04/20/2021    2:46 PM 08/05/2019    3:34 PM  [place] in the past year? 0 1 0 0 0  Number falls in past yr: 0 1 0 0 0  Injury with Fall? 0 0 0 0 0  Risk for fall due to : No Fall Risks;Medication side effect No Fall Risks  Impaired vision   Follow up Falls prevention discussed;Education provided;Falls evaluation completed Falls evaluation completed  Falls prevention discussed Falls evaluation  completed    FALL RISK PREVENTION PERTAINING TO THE HOME:  Any stairs in or around the home? Yes  If so, are there any without handrails?  No  Home free of loose throw rugs in walkways, pet beds, electrical cords, etc? Yes  Adequate lighting in your home to reduce risk of falls? Yes   ASSISTIVE DEVICES UTILIZED TO PREVENT FALLS:  Life alert? No  Use of a cane, walker or w/c? No  Grab bars in the bathroom? Yes  Shower chair or bench in shower? Yes  Elevated toilet seat or a handicapped toilet? No   TIMED UP AND GO:  Was the test performed? Yes .  Length of time to ambulate 10 feet: 5 sec.   Gait steady and fast without use of assistive device  Cognitive Function:        05/03/2022   11:31 AM 04/20/2021    2:48 PM  6CIT Screen  What Year? 0 points 0 points  What month? 0 points 0 points  What time? 0 points 0 points  Count back from 20 0 points 0 points  Months in reverse 0 points 0 points  Repeat phrase 2 points 0 points  Total Score 2 points 0 points    Immunizations Immunization History  Administered Date(s) Administered   Fluad Quad(high Dose 65+) 05/21/2021   Influenza Split 05/27/2012, 06/02/2020   Influenza, High Dose Seasonal PF 07/17/2015, 06/27/2016, 05/24/2017   Influenza-Unspecified 05/24/2018   MODERNA COVID-19 SARS-COV-2 PEDS BIVALENT BOOSTER 6Y-11Y 04/14/2022   Moderna SARS-COV2 Booster Vaccination 05/18/2020, 12/10/2020   Moderna Sars-Covid-2 Vaccination 07/31/2019, 08/28/2019, 12/10/2020   Pfizer Covid-19 Vaccine Bivalent Booster 5y-11y 05/22/2021   Pneumococcal Conjugate-13 05/24/2017   Pneumococcal Polysaccharide-23 06/04/2018   Tdap 11/30/2011   Zoster Recombinat (Shingrix) 04/12/2017, 08/23/2017    TDAP status: Due, Education has been provided regarding the importance of this vaccine. Advised may receive this vaccine at local pharmacy or Health Dept. Aware to provide a copy of the vaccination record if obtained from local pharmacy or Health Dept. Verbalized acceptance and understanding.  Flu Vaccine status: Up to date  Pneumococcal vaccine status: Up to date  Covid-19 vaccine  status: Completed vaccines  Qualifies for Shingles Vaccine? Yes   Zostavax completed Yes   Shingrix Completed?: Yes  Screening Tests Health Maintenance  Topic Date Due   TETANUS/TDAP  11/29/2021   INFLUENZA VACCINE  02/15/2022   COVID-19 Vaccine (6 - Moderna risk series) 06/09/2022   COLONOSCOPY (Pts 45-45yr Insurance coverage will need to be confirmed)  03/14/2023   MAMMOGRAM  05/13/2023   Pneumonia Vaccine 72 Years old  Completed   DEXA SCAN  Completed   Hepatitis C Screening  Completed   Zoster Vaccines- Shingrix  Completed   HPV VACCINES  Aged Out    Health Maintenance  Health Maintenance Due  Topic Date Due   TETANUS/TDAP  11/29/2021   INFLUENZA VACCINE  02/15/2022    Colorectal cancer screening: Type of screening: Colonoscopy. Completed 03/13/2020. Repeat every 3 years  Mammogram status: scheduled for 06/08/2022  Bone Density status: Completed 12/16/2020.   Lung Cancer Screening: (Low Dose CT Chest recommended if Age 72-80years, 30 pack-year currently smoking OR have quit w/in 15years.) does not qualify.   Lung Cancer Screening Referral: no  Additional Screening:  Hepatitis C Screening: does qualify; Completed 06/24/2015  Vision Screening: Recommended annual ophthalmology exams for early detection of glaucoma and other disorders of the eye. Is the patient up to date with their annual eye exam?  Yes  Who is the provider or what is the name of the office in which the patient attends annual eye exams? [facility] If pt is not established with a provider, would they like to be referred to a provider to establish care? No .   Dental Screening: Recommended annual dental exams for proper oral hygiene  Community Resource Referral / Chronic Care Management: CRR required this visit?  No   CCM required this visit?  No      Plan:     I have personally reviewed and noted the following in the patient's chart:   Medical and social history Use of alcohol,  tobacco or illicit drugs  Current medications and supplements including opioid prescriptions. Patient is not currently taking opioid prescriptions. Functional ability and status Nutritional status Physical activity Advanced directives List of other physicians Hospitalizations, surgeries, and ER visits in previous 12 months Vitals Screenings to include cognitive, depression, and falls Referrals and appointments  In addition, I have reviewed and discussed with patient certain preventive protocols, quality metrics, and best practice recommendations. A written personalized care plan for preventive services as well as general preventive health recommendations were provided to patient.     Kellie Simmering, LPN   [id number]   Nurse Notes: none

## 2022-05-13 ENCOUNTER — Inpatient Hospital Stay: Payer: Medicare Other | Admitting: Hematology and Oncology

## 2022-05-27 ENCOUNTER — Telehealth: Payer: Self-pay | Admitting: Pharmacist

## 2022-05-27 NOTE — Progress Notes (Signed)
    Chronic Care Management Pharmacy Assistant   Name: NYKIAH MA  MRN: [id number] DOB: 1951/02/06  Reason for Encounter: Disease State/ General Assessment   Recent office visits:  05/03/22 Kellie Simmering, LPN - Patient presented for Medicare Annual Wellness Exam. No medication changes.   Recent consult visits:  06/13/22 Nicholas Lose, MD  - Patient presented for Postmenopause and other concerns. No medication changes.  Hospital visits:  None in previous 6 months  Medications: Outpatient Encounter Medications as of 05/27/2022  Medication Sig   levothyroxine (SYNTHROID) 88 MCG tablet Take 1 tablet (88 mcg total) by mouth daily.   Multiple Vitamins-Minerals (MULTIVITAMIN ADULTS 50+ PO) Take 1 tablet by mouth daily.   omeprazole (PRILOSEC) 40 MG capsule TAKE 1 CAPSULE BY MOUTH TWICE A DAY   rosuvastatin (CRESTOR) 10 MG tablet Take 1 tablet (10 mg total) by mouth daily.   tamoxifen (NOLVADEX) 20 MG tablet TAKE 1 TABLET BY MOUTH EVERY DAY   No facility-administered encounter medications on file as of 05/27/2022.  [place] for General Review Call  Adherence Review:  Does the Clinical Pharmacist Assistant have access to adherence rates? Yes Adherence rates for STAR metric medications  Rosuvastatin 10 mg -  [facility]  Rosuvastatin 10 mg -  Last filled [facility]/09/22 [facility]   Does the patient have >5 day gap between last estimated fill dates for any of the above medications or other medication gaps? No  Disease State Questions:  Able to connect with Patient? No  Care Gaps: TDAP - Overdue CCM-  1/24 BP- 104/62 09/15/21 AWV- 05/03/22 Lab Results  Component Value Date   HGBA1C 6.2 07/21/2021    Star Rating Drugs: Rosuvastatin 10 mg -  Last filled 05/19/22 90 DS at [facility] Pharmacist Assistant [phone]

## 2022-06-08 ENCOUNTER — Ambulatory Visit
Admission: RE | Admit: 2022-06-08 | Discharge: 2022-06-08 | Disposition: A | Discharge status: Home / Self Care | Payer: Medicare Other | Source: Ambulatory Visit | Attending: Hematology and Oncology | Admitting: Hematology and Oncology

## 2022-06-08 DIAGNOSIS — Z1231 Encounter for screening mammogram for malignant neoplasm of breast: Secondary | ICD-10-CM

## 2022-06-08 NOTE — Progress Notes (Signed)
Patient Care Team: [name], [name], MD as PCP - General Fay Records, MD as PCP - Cardiology (Cardiology) [name], MD as Attending Physician (Obstetrics and Gynecology) Sharyne Peach, MD (Ophthalmology) Fanny Skates, MD as Consulting Physician (General Surgery) Thea Silversmith, MD as Consulting Physician (Radiation Oncology) Mauri Pole, MD as Consulting Physician (Gastroenterology) Nicholas Lose, MD as Consulting Physician (Hematology and Oncology) Viona Gilmore, [facility] as Pharmacist (Pharmacist)  DIAGNOSIS:  Encounter Diagnoses  Name Primary?   Malignant neoplasm of upper-outer quadrant of right breast in female, estrogen receptor positive ([place])    Postmenopausal Yes    SUMMARY OF ONCOLOGIC HISTORY: Oncology History  Breast cancer of upper-outer quadrant of right female breast ([place])  12/24/2013 Initial Diagnosis   Breast cancer of upper-outer quadrant of right female breast: Initial biopsy showed DCIS with calcifications and atypical ductal hyperplasia with suspicion of stromal invasion ER 100% PR 100%   01/20/2014 Surgery   Right breast lumpectomy, IDC grade 1; 3.9 cm and 0.25 cm with DCIS int grade with ALH 3 SLN negative superior margin positive, ER 100% PR 100% HER-2 negative ratio 1.1 Ki-67 3% Oncotype 14 low risk    02/05/2014 Surgery   Reexcision of the superior margin no residual cancer   04/17/2014 - 05/16/2014 Radiation Therapy   Adjuvant radiation therapy   07/25/2014 -  Anti-estrogen oral therapy   Anastrozole 1 mg daily switched to tamoxifen 20 mg daily from 09/23/2014 due to osteoporosis   04/24/2018 Surgery   Mammogram on 03/27/18 showed a distortion in the left breast. Biopsy on 03/29/19 showed atypical lobular hyperplasia. Lumpectomy on 04/24/18 with Dr. Brantley Stage.     CHIEF COMPLIANT:  Follow-up of right breast cancer on tamoxifen   INTERVAL HISTORY: Donna Manning is a  71 y.o. with above-mentioned history of right breast cancer treated  with lumpectomy, radiation, and who is currently on anti-estrogen therapy with tamoxifen. She presents to the clinic for a follow-up. She reports that she is tolerating the tamoxifen extremely well with no side effects or symptoms. Denies hot flashes. She says she is irritable. Denies any pain or discomfort in breast.    ALLERGIES:  is allergic to contrast media [iodinated contrast media], actonel [risedronate sodium], and starch.  MEDICATIONS:  Current Outpatient Medications  Medication Sig Dispense Refill   levothyroxine (SYNTHROID) 88 MCG tablet Take 1 tablet (88 mcg total) by mouth daily. 90 tablet 3   Multiple Vitamins-Minerals (MULTIVITAMIN ADULTS 50+ PO) Take 1 tablet by mouth daily.     omeprazole (PRILOSEC) 40 MG capsule TAKE 1 CAPSULE BY MOUTH TWICE A DAY 180 capsule 3   prednisoLONE acetate (PRED FORTE) 1 % ophthalmic suspension 1 drop 2 (two) times daily.     rosuvastatin (CRESTOR) 10 MG tablet Take 1 tablet (10 mg total) by mouth daily. 90 tablet 3   tamoxifen (NOLVADEX) 20 MG tablet Take 1 tablet (20 mg total) by mouth daily. 90 tablet 3   No current facility-administered medications for this visit.    PHYSICAL EXAMINATION: ECOG PERFORMANCE STATUS: 1 - Symptomatic but completely ambulatory  Vitals:   06/13/22 1519  BP: 135/64  Pulse: 89  Resp: 18  Temp: (!) 97.3 F (36.3 C)  SpO2: 98%   Filed Weights   06/13/22 1519  Weight: 169 lb 9.6 oz (76.9 kg)      LABORATORY DATA:  I have reviewed the data as listed    Latest Ref Rng & Units 07/21/2021   11:57 AM 09/08/2020  3:15 PM 07/07/2020   10:24 AM  CMP  Glucose 70 - 99 mg/dL 100  75  101   BUN 6 - 23 mg/dL _0 Creatinine 0.40 - 1.20 mg/dL 0.81  0.84  0.70   Sodium 135 - 145 mEq/L 139  142  141   Potassium 3.5 - 5.1 mEq/L 3.9  5.0  4.4   Chloride 96 - 112 mEq/L 104  105  106   CO2 19 - 32 mEq/L _1 Calcium 8.4 - 10.5 mg/dL 9.0  9.1  9.3   Total Protein 6.0 - 8.3 g/dL 6.8   6.6   Total  Bilirubin 0.2 - 1.2 mg/dL 0.4   0.4   Alkaline Phos 39 - 117 U/L 40     AST 0 - 37 U/L 23   19   ALT 0 - 35 U/L 18   14     Lab Results  Component Value Date   WBC 5.2 09/08/2021   HGB 12.2 09/08/2021   HCT 37.2 09/08/2021   MCV 91.9 09/08/2021   PLT 213.0 09/08/2021   NEUTROABS 2.6 09/08/2021    ASSESSMENT & PLAN:  Breast cancer of upper-outer quadrant of right female breast ([place]) Right breast invasive ductal carcinoma T2, N0, M0 stage IIA 0.9 cm and a separate nodule 0.25 cm ER/PR positive HER-2 negative, Oncotype DX recurrence score is 14 (9% risk of distant recurrence over 10 years with tamoxifen alone) status post radiation therapy, started antiestrogen therapy with anastrozole in January 2016 years switched to tamoxifen 09/23/2014 (due to osteoporosis), plan to treat for 10 years 03/28/2018: Atypical lobular hyperplasia left breast biopsy, left lumpectomy: Fibrocystic change   Tamoxifen toxicities: Denies any hot flashes or myalgias, complains of hair thinning. Endometrial thickening: She no longer has a white discharge.   Osteoporosis: DEXA scan November 2015 showed a T score of -2.6 (because of which we switched treatment to tamoxifen): Prolia was offered but patient refused. Bone density 12/16/2020: T score -2.5: Osteoporosis (given previous adverse effects to bisphosphonates, patient wants to watch and monitor)--T score of spine improved from -2.6 to -2.2.   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    Answer:   [facility]   The patient has a good understanding of the overall plan. she agrees with it. she will call with any problems that may develop before the next visit here. Total time spent: 30 mins including face to face time and time spent for planning, charting and co-ordination of care   Harriette Ohara, MD 06/13/22    I Gardiner Coins am scribing for Dr. Lindi Adie  I have reviewed the above documentation for accuracy and completeness, and I agree with the above.

## 2022-06-13 ENCOUNTER — Other Ambulatory Visit: Payer: Self-pay

## 2022-06-13 ENCOUNTER — Inpatient Hospital Stay: Payer: Medicare Other | Attending: Hematology and Oncology | Admitting: Hematology and Oncology

## 2022-06-13 VITALS — BP 135/64 | HR 89 | Temp 97.3°F | Resp 18 | Ht 66.5 in | Wt 169.6 lb

## 2022-06-13 DIAGNOSIS — Z780 Asymptomatic menopausal state: Secondary | ICD-10-CM

## 2022-06-13 DIAGNOSIS — Z79811 Long term (current) use of aromatase inhibitors: Secondary | ICD-10-CM | POA: Diagnosis not present

## 2022-06-13 DIAGNOSIS — Z170 Estrogen receptor positive status [ER+]: Secondary | ICD-10-CM | POA: Diagnosis not present

## 2022-06-13 DIAGNOSIS — C50411 Malignant neoplasm of upper-outer quadrant of right female breast: Secondary | ICD-10-CM

## 2022-06-13 MED ORDER — TAMOXIFEN CITRATE 20 MG PO TABS: 20.0000 mg | ORAL_TABLET | Freq: Every day | ORAL | 3 refills | Status: DC

## 2022-06-13 NOTE — Assessment & Plan Note (Addendum)
Right breast invasive ductal carcinoma T2, N0, M0 stage IIA 0.9 cm and a separate nodule 0.25 cm ER/PR positive HER-2 negative, Oncotype DX recurrence score is 14 (9% risk of distant recurrence over 10 years with tamoxifen alone) status post radiation therapy, started antiestrogen therapy with anastrozole in January 2016 years switched to tamoxifen 09/23/2014 (due to osteoporosis), plan to treat for 10 years 03/28/2018: Atypical lobular hyperplasia left breast biopsy, left lumpectomy: Fibrocystic change   Tamoxifen toxicities: Denies any hot flashes or myalgias, complains of hair thinning. Endometrial thickening: She no longer has a white discharge.   Osteoporosis: DEXA scan November 2015 showed a T score of -2.6 (because of which we switched treatment to tamoxifen): Prolia was offered but patient refused. Bone density 12/16/2020: T score -2.5: Osteoporosis (given previous adverse effects to bisphosphonates, patient wants to watch and monitor)--T score of spine improved from -2.6 to -2.2.   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

## 2022-06-22 ENCOUNTER — Other Ambulatory Visit: Payer: Self-pay | Admitting: Internal Medicine

## 2022-08-15 ENCOUNTER — Telehealth: Payer: Medicare Other

## 2022-08-16 DIAGNOSIS — R7303 Prediabetes: Secondary | ICD-10-CM | POA: Diagnosis not present

## 2022-08-16 DIAGNOSIS — H524 Presbyopia: Secondary | ICD-10-CM | POA: Diagnosis not present

## 2022-08-16 DIAGNOSIS — H2513 Age-related nuclear cataract, bilateral: Secondary | ICD-10-CM | POA: Diagnosis not present

## 2022-08-19 ENCOUNTER — Telehealth: Payer: Self-pay

## 2022-08-19 NOTE — Progress Notes (Signed)
Patient ID: PALOMA GRANGE, female   DOB: [birth year], 71 y.o.   MRN: [id number]  Care Management & Coordination Services Pharmacy Team  Reason for Encounter:  Offer follow up with Pharmacist  08/19/22 Call to patient to offer to reschedule follow up with Pharmacist. Did not reach left a detailed voicemail with my contact information for a return call.  What concerns do you have about your medications?   Chart Updates:  Recent office visits:  None  Recent consult visits:  06/13/22 Nicholas Lose, MD (Oncology) - Patient presented for Postmenopausal and other concerns. No medication changes.   Hospital visits:  None in previous 6 months  Medications: Outpatient Encounter Medications as of 08/19/2022  Medication Sig   levothyroxine (SYNTHROID) 88 MCG tablet TAKE 1 TABLET BY MOUTH EVERY DAY   Multiple Vitamins-Minerals (MULTIVITAMIN ADULTS 50+ PO) Take 1 tablet by mouth daily.   omeprazole (PRILOSEC) 40 MG capsule TAKE 1 CAPSULE BY MOUTH TWICE A DAY   prednisoLONE acetate (PRED FORTE) 1 % ophthalmic suspension 1 drop 2 (two) times daily.   rosuvastatin (CRESTOR) 10 MG tablet Take 1 tablet (10 mg total) by mouth daily.   tamoxifen (NOLVADEX) 20 MG tablet Take 1 tablet (20 mg total) by mouth daily.   No facility-administered encounter medications on file as of 08/19/2022.    Recent vitals BP Readings from Last 3 Encounters:  06/13/22 135/64  05/03/22 (!) 108/50  09/15/21 104/62   Pulse Readings from Last 3 Encounters:  06/13/22 89  05/03/22 76  09/15/21 89   Wt Readings from Last 3 Encounters:  06/13/22 169 lb 9.6 oz (76.9 kg)  05/03/22 169 lb (76.7 kg)  09/15/21 169 lb 6.4 oz (76.8 kg)   BMI Readings from Last 3 Encounters:  06/13/22 26.96 kg/m  05/03/22 26.87 kg/m  09/15/21 26.53 kg/m    Recent lab results    Component Value Date/Time   NA 139 07/21/2021 1157   NA 142 09/08/2020 1515   NA 141 09/08/2016 1007   K 3.9 07/21/2021 1157   K 4.3 09/08/2016 1007    CL 104 07/21/2021 1157   CO2 27 07/21/2021 1157   CO2 26 09/08/2016 1007   GLUCOSE 100 (H) 07/21/2021 1157   GLUCOSE 104 09/08/2016 1007   BUN 13 07/21/2021 1157   BUN 15 09/08/2020 1515   BUN 12.4 09/08/2016 1007   CREATININE 0.81 07/21/2021 1157   CREATININE 0.70 07/07/2020 1024   CREATININE 0.8 09/08/2016 1007   CALCIUM 9.0 07/21/2021 1157   CALCIUM 9.1 09/08/2016 1007    Lab Results  Component Value Date   CREATININE 0.81 07/21/2021   GFR 72.83 07/21/2021   EGFR 77 (L) 09/08/2016   GFRNONAA 71 09/08/2020   GFRAA 81 09/08/2020   Lab Results  Component Value Date/Time   HGBA1C 6.2 07/21/2021 11:57 AM   HGBA1C 6.2 09/06/2019 11:47 AM    Lab Results  Component Value Date   CHOL 122 12/01/2021   HDL 58 12/01/2021   LDLCALC 44 12/01/2021   LDLDIRECT 147.2 11/23/2011   TRIG 113 12/01/2021   CHOLHDL 2.1 12/01/2021    Care Gaps: TDAP - Overdue COVID Booster - Overdue AWV- 10/23  Star Rating Drugs:  Rosuvastatin 10 mg -  Last filled 05/19/22 90 DS at [facility] Pharmacist Assistant [phone]

## 2022-08-31 DIAGNOSIS — H2512 Age-related nuclear cataract, left eye: Secondary | ICD-10-CM | POA: Diagnosis not present

## 2022-08-31 DIAGNOSIS — H269 Unspecified cataract: Secondary | ICD-10-CM | POA: Diagnosis not present

## 2022-08-31 DIAGNOSIS — H259 Unspecified age-related cataract: Secondary | ICD-10-CM | POA: Diagnosis not present

## 2022-09-12 ENCOUNTER — Other Ambulatory Visit: Payer: Self-pay | Admitting: Internal Medicine

## 2022-10-07 ENCOUNTER — Other Ambulatory Visit: Payer: Self-pay | Admitting: Internal Medicine

## 2022-10-28 ENCOUNTER — Other Ambulatory Visit: Payer: Self-pay | Admitting: Internal Medicine

## 2022-10-31 ENCOUNTER — Other Ambulatory Visit: Payer: Self-pay | Admitting: Internal Medicine

## 2022-11-04 ENCOUNTER — Encounter: Payer: Self-pay | Admitting: Family Medicine

## 2022-11-04 ENCOUNTER — Ambulatory Visit (INDEPENDENT_AMBULATORY_CARE_PROVIDER_SITE_OTHER): Payer: Medicare Other | Admitting: Family Medicine

## 2022-11-04 VITALS — BP 120/70 | HR 88 | Temp 98.6°F | Resp 16 | Ht 66.5 in | Wt 173.5 lb

## 2022-11-04 DIAGNOSIS — K219 Gastro-esophageal reflux disease without esophagitis: Secondary | ICD-10-CM

## 2022-11-04 DIAGNOSIS — R221 Localized swelling, mass and lump, neck: Secondary | ICD-10-CM | POA: Diagnosis not present

## 2022-11-04 DIAGNOSIS — E785 Hyperlipidemia, unspecified: Secondary | ICD-10-CM | POA: Diagnosis not present

## 2022-11-04 DIAGNOSIS — R1310 Dysphagia, unspecified: Secondary | ICD-10-CM

## 2022-11-04 DIAGNOSIS — E039 Hypothyroidism, unspecified: Secondary | ICD-10-CM

## 2022-11-04 MED ORDER — ROSUVASTATIN CALCIUM 10 MG PO TABS: 10.0000 mg | ORAL_TABLET | Freq: Every day | ORAL | 0 refills | Status: DC

## 2022-11-04 NOTE — Patient Instructions (Addendum)
A few things to remember from today's visit:  Neck mass - Plan: Ambulatory referral to ENT, CBC with Differential/Platelet, US Soft Tissue Head/Neck (NON-THYROID)  Odynophagia - Plan: Ambulatory referral to ENT  Gastroesophageal reflux disease without esophagitis  No changes in medications today. You need fasting labs next visit. Cholesterol medication sent.  If you need refills for medications you take chronically, please call your pharmacy. Do not use My Chart to request refills or for acute issues that need immediate attention. If you send a my chart message, it may take a few days to be addressed, specially if I am not in the office.  Please be sure medication list is accurate. If a new problem present, please set up appointment sooner than planned today.

## 2022-11-04 NOTE — Progress Notes (Signed)
ACUTE VISIT Chief Complaint  Patient presents with   Sore Throat    Since Easter, pain comes and goes, irritation is constant. Denies cough.    HPI: [name] is a 72 y.o. female with PMHx significant for hypothyroidism,HLD,cervical dystonia, GERD with barrett's esophagus,and right breast cancer on Tamoxifen here today complaining of sore throat as described above. She has had sore throat persisting since Easter.  It does not hurt when she swallows.  Sore Throat  This is a new problem. The current episode started 1 to 4 weeks ago. The problem has been unchanged. The pain is worse on the right side. There has been no fever. The pain is moderate. Associated symptoms include a hoarse voice. Pertinent negatives include no abdominal pain, congestion, drooling, ear discharge, ear pain, headaches, plugged ear sensation, neck pain, shortness of breath, stridor, swollen glands, trouble swallowing or vomiting. She has had no exposure to strep or mono. She has tried nothing for the symptoms.   The pain is described as sharp and mainly occurs most of the time during talking and sometimes when she is not. It is daily pain throughout the day.   She reports intermittent soreness on the roof of her mouth. She has not noted oral lesions. She also reports changes in her voice but denies any cough or wheezing.  GERD and Barrett's esophagus, she experiences heartburn intermittently. She is omeprazole 40 mg daily, concerned about possible long term side effects of PPI's, specifically dementia risk.  She  chills, abnormal weight loss, night sweats, or swollen glands.  She reports no difficulty swallowing pills or food.  She expresses concerns about potential side effects from radiation treatment as part of treatment for right breast cancer.  She mentions that she has been calling the pharmacy for her Rosuvastatin 10 mg. She reports having enough medication for two more days before running  out. She follows with cardiologist.  Lab Results  Component Value Date   CHOL 122 12/01/2021   HDL 58 12/01/2021   LDLCALC 44 12/01/2021   LDLDIRECT 147.2 11/23/2011   TRIG 113 12/01/2021   CHOLHDL 2.1 12/01/2021   Hypothyroidism on Levothyroxine 88 mcg daily. She has tolerated medication well.  Lab Results  Component Value Date   TSH 0.89 09/08/2021   Review of Systems  Constitutional:  Positive for fatigue.  HENT:  Positive for hoarse voice. Negative for congestion, drooling, ear discharge, ear pain, mouth sores, postnasal drip and trouble swallowing.   Respiratory:  Negative for shortness of breath and stridor.   Cardiovascular:  Negative for chest pain and palpitations.  Gastrointestinal:  Negative for abdominal pain, nausea and vomiting.  Endocrine: Negative for cold intolerance and heat intolerance.  Genitourinary:  Negative for decreased urine volume, dysuria and hematuria.  Musculoskeletal:  Negative for neck pain.  Skin:  Negative for rash.  Neurological:  Negative for syncope and headaches.  Psychiatric/Behavioral:  Negative for confusion. The patient is nervous/anxious.   See other pertinent positives and negatives in HPI.  Current Outpatient Medications on File Prior to Visit  Medication Sig Dispense Refill   levothyroxine (SYNTHROID) 88 MCG tablet TAKE 1 TABLET BY MOUTH EVERY DAY 90 tablet 0   Multiple Vitamins-Minerals (MULTIVITAMIN ADULTS 50+ PO) Take 1 tablet by mouth daily.     omeprazole (PRILOSEC) 40 MG capsule TAKE 1 CAPSULE BY MOUTH TWICE A DAY 180 capsule 3   prednisoLONE acetate (PRED FORTE) 1 % ophthalmic suspension 1 drop 2 (two) times daily.  rosuvastatin (CRESTOR) 10 MG tablet Take 1 tablet (10 mg total) by mouth daily. Pt.needs an appt.to receive anymore refills.Thank You. 3rd and final Attempt. 15 tablet 0   tamoxifen (NOLVADEX) 20 MG tablet Take 1 tablet (20 mg total) by mouth daily. 90 tablet 3   No current facility-administered medications  on file prior to visit.   Past Medical History:  Diagnosis Date   Atypical lobular hyperplasia (ALH) of left breast 03/2018   Barrett's esophagus 2009   Benign head tremor    Breast cancer    Breast cancer, right breast 01/2014   "had 5 weeks radiation after lumpectomy" (04/24/2018)   Dental bridge present    lower left   Dental crowns present    GERD (gastroesophageal reflux disease)    History of angina    History of DVT (deep vein thrombosis) ~ 1980   after a pregnancy   History of radiation therapy 2015   Hyperlipidemia    Hypothyroidism    Irregular heart beat    "dx'd by 1 dr; not noted by the cardiologist" (04/24/2018)   NSAID-induced gastric ulcer    Osteoporosis    Personal history of radiation therapy    PONV (postoperative nausea and vomiting)    Allergies  Allergen Reactions   Contrast Media [Iodinated Contrast Media] Hives and Shortness Of Breath   Actonel [Risedronate Sodium] Other (See Comments)    HIP PAIN   Starch Rash    IN LATEX GLOVES    Social History   Socioeconomic History   Marital status: Married    Spouse name: Not on file   Number of children: 5   Years of education: Not on file   Highest education level: Not on file  Occupational History   Occupation: homemaker  Tobacco Use   Smoking status: Never   Smokeless tobacco: Never  Vaping Use   Vaping Use: Never used  Substance and Sexual Activity   Alcohol use: Not Currently    Comment: 04/24/2018 "drank a few beers in college"   Drug use: Never   Sexual activity: Not Currently  Other Topics Concern   Not on file  Social History Narrative   Not on file   Social Determinants of Health   Financial Resource Strain: Low Risk  (05/03/2022)   Overall Financial Resource Strain (CARDIA)    Difficulty of Paying Living Expenses: Not hard at all  Food Insecurity: No Food Insecurity (05/03/2022)   Hunger Vital Sign    Worried About Running Out of Food in the Last Year: Never true    Ran Out  of Food in the Last Year: Never true  Transportation Needs: No Transportation Needs (05/03/2022)   PRAPARE - Administrator, Civil Service (Medical): No    Lack of Transportation (Non-Medical): No  Physical Activity: Sufficiently Active (05/03/2022)   Exercise Vital Sign    Days of Exercise per Week: 5 days    Minutes of Exercise per Session: 30 min  Stress: No Stress Concern Present (05/03/2022)   [employer] of Occupational Health - Occupational Stress Questionnaire    Feeling of Stress : Not at all  Social Connections: Socially Integrated (04/20/2021)   Social Connection and Isolation Panel [NHANES]    Frequency of Communication with Friends and Family: More than three times a week    Frequency of Social Gatherings with Friends and Family: More than three times a week    Attends Religious Services: 1 to 4 times per year  Active Member of Clubs or Organizations: Yes    Attends Banker Meetings: 1 to 4 times per year    Marital Status: Married   Today's Vitals   11/04/22 1529  BP: 120/70  Pulse: 88  Resp: 16  Temp: 98.6 F (37 C)  TempSrc: Oral  SpO2: 97%  Weight: 173 lb 8 oz (78.7 kg)  Height: 5' 6.5" (1.689 m)   Body mass index is 27.58 kg/m.  Physical Exam Vitals and nursing note reviewed.  Constitutional:      General: She is not in acute distress.    Appearance: She is well-developed. She is not ill-appearing.  HENT:     Head: Normocephalic and atraumatic.     Mouth/Throat:     Lips: No lesions.     Mouth: Mucous membranes are moist.     Pharynx: Oropharynx is clear. No pharyngeal swelling, oropharyngeal exudate or posterior oropharyngeal erythema.     Tonsils: No tonsillar exudate.  Eyes:     Conjunctiva/sclera: Conjunctivae normal.  Neck:     Thyroid: No thyroid mass.   Cardiovascular:     Rate and Rhythm: Normal rate and regular rhythm.     Heart sounds: No murmur heard. Pulmonary:     Effort: Pulmonary effort is normal.  No respiratory distress.     Breath sounds: Normal breath sounds. No stridor.  Musculoskeletal:     Cervical back: No edema or erythema. No muscular tenderness.  Lymphadenopathy:     Head:     Right side of head: No submandibular adenopathy.     Left side of head: No submandibular adenopathy.     Cervical: Cervical adenopathy present.     Right cervical: Superficial cervical adenopathy present.  Skin:    General: Skin is warm.     Findings: No erythema or rash.  Neurological:     Mental Status: She is alert and oriented to person, place, and time.  Psychiatric:        Mood and Affect: Affect normal. Mood is anxious.   ASSESSMENT AND PLAN:  Ms. Kinnidi was seen today for sore throat.  Diagnoses and all orders for this visit: Lab Results  Component Value Date   WBC 5.8 11/04/2022   HGB 11.7 11/04/2022   HCT 34.5 11/04/2022   MCV 91 11/04/2022   PLT 213 11/04/2022   Lab Results  Component Value Date   TSH 0.464 11/04/2022   Odynophagia Problem has been persistent, exacerbated by talking and reporting some voice changes. It could be related to GERD. Other possible causes discussed. Instructed about warning signs. ENT appt will be arranged.  -     Ambulatory referral to ENT  Neck mass ? Lymphadenopathy. We discussed possible etiologies. Monitor for changes.  -     Ambulatory referral to ENT -     CBC with Differential/Platelet; Future -     US SOFT TISSUE HEAD & NECK (NON-THYROID); Future  Gastroesophageal reflux disease without esophagitis She is still having symptoms. She does not want to change to a different PPI. We discussed Omeprazole side effects. She follows with GI and planning on arranging f/u appt.  Hyperlipidemia, unspecified hyperlipidemia type Continue Rosuvastatin 10 mg daily and low fat diet. She needs to follow with her cardiologist pr PCP before next refill is needed.  -     Rosuvastatin Calcium; Take 1 tablet (10 mg total) by mouth daily.  Pt.needs an appt.to receive anymore refills.Thank You. 3rd and final Attempt.  Dispense: 90  tablet; Refill: 0  Hypothyroidism, unspecified type Problem has been well controlled. No changes in Levothyroxine dose.  -     TSH; Future  Return if symptoms worsen or fail to improve.  Isahi Godwin G. [place], MD  [facility]. [place] office.

## 2022-11-05 LAB — CBC WITH DIFFERENTIAL/PLATELET
Basophils Absolute: 0.1 10*3/uL (ref 0.0–0.2)
Basos: 1 %
EOS (ABSOLUTE): 0.3 10*3/uL (ref 0.0–0.4)
Eos: 5 %
Hematocrit: 34.5 % (ref 34.0–46.6)
Hemoglobin: 11.7 g/dL (ref 11.1–15.9)
Immature Grans (Abs): 0 10*3/uL (ref 0.0–0.1)
Immature Granulocytes: 0 %
Lymphocytes Absolute: 2.1 10*3/uL (ref 0.7–3.1)
Lymphs: 36 %
MCH: 30.9 pg (ref 26.6–33.0)
MCHC: 33.9 g/dL (ref 31.5–35.7)
MCV: 91 fL (ref 79–97)
Monocytes Absolute: 0.6 10*3/uL (ref 0.1–0.9)
Monocytes: 10 %
Neutrophils Absolute: 2.8 10*3/uL (ref 1.4–7.0)
Neutrophils: 48 %
Platelets: 213 10*3/uL (ref 150–450)
RBC: 3.79 x10E6/uL (ref 3.77–5.28)
RDW: 12 % (ref 11.7–15.4)
WBC: 5.8 10*3/uL (ref 3.4–10.8)

## 2022-11-05 LAB — TSH: TSH: 0.464 u[IU]/mL (ref 0.450–4.500)

## 2022-11-07 ENCOUNTER — Telehealth: Payer: Self-pay | Admitting: Internal Medicine

## 2022-11-07 NOTE — Telephone Encounter (Signed)
*  STAT* If patient is at the pharmacy, call can be transferred to refill team.   1. Which medications need to be refilled? (please list name of each medication and dose if known) rosuvastatin (CRESTOR) 10 MG tablet   2. Which pharmacy/location (including street and city if local pharmacy) is medication to be sent to?   [id number] [place], [place] - [address]    3. Do they need a 30 day or 90 day supply? 30  Patient has appt made for 06/25

## 2022-11-07 NOTE — Telephone Encounter (Signed)
Pt's medication was sent to pt's pharmacy by pt's PCP. Confirmation received.

## 2022-11-08 ENCOUNTER — Other Ambulatory Visit: Payer: Self-pay | Admitting: Family Medicine

## 2022-11-08 ENCOUNTER — Ambulatory Visit (HOSPITAL_BASED_OUTPATIENT_CLINIC_OR_DEPARTMENT_OTHER)
Admission: RE | Admit: 2022-11-08 | Discharge: 2022-11-08 | Disposition: A | Discharge status: Home / Self Care | Payer: Medicare Other | Source: Ambulatory Visit | Attending: Family Medicine | Admitting: Family Medicine

## 2022-11-08 DIAGNOSIS — K219 Gastro-esophageal reflux disease without esophagitis: Secondary | ICD-10-CM

## 2022-11-08 DIAGNOSIS — R1310 Dysphagia, unspecified: Secondary | ICD-10-CM

## 2022-11-08 DIAGNOSIS — R221 Localized swelling, mass and lump, neck: Secondary | ICD-10-CM

## 2022-11-08 DIAGNOSIS — E039 Hypothyroidism, unspecified: Secondary | ICD-10-CM

## 2022-11-08 DIAGNOSIS — E041 Nontoxic single thyroid nodule: Secondary | ICD-10-CM | POA: Diagnosis not present

## 2022-11-08 DIAGNOSIS — E785 Hyperlipidemia, unspecified: Secondary | ICD-10-CM

## 2022-11-09 DIAGNOSIS — H2511 Age-related nuclear cataract, right eye: Secondary | ICD-10-CM | POA: Diagnosis not present

## 2022-11-09 DIAGNOSIS — H269 Unspecified cataract: Secondary | ICD-10-CM | POA: Diagnosis not present

## 2022-11-16 ENCOUNTER — Other Ambulatory Visit: Payer: Self-pay | Admitting: Internal Medicine

## 2022-11-16 MED ORDER — LEVOTHYROXINE SODIUM 88 MCG PO TABS: 88.0000 ug | ORAL_TABLET | Freq: Every day | ORAL | 0 refills | Status: DC

## 2022-11-17 DIAGNOSIS — R490 Dysphonia: Secondary | ICD-10-CM | POA: Diagnosis not present

## 2022-11-17 DIAGNOSIS — E041 Nontoxic single thyroid nodule: Secondary | ICD-10-CM | POA: Diagnosis not present

## 2022-11-23 ENCOUNTER — Other Ambulatory Visit: Payer: Self-pay | Admitting: Otolaryngology

## 2022-11-23 ENCOUNTER — Other Ambulatory Visit: Payer: Medicare Other

## 2022-11-23 ENCOUNTER — Telehealth: Payer: Self-pay

## 2022-11-23 DIAGNOSIS — E041 Nontoxic single thyroid nodule: Secondary | ICD-10-CM

## 2022-11-23 NOTE — Progress Notes (Signed)
Patient ID: Donna Manning, female   DOB: [birth year], 72 y.o.   MRN: [id number]   Care Management & Coordination Services Pharmacy Team  Reason for Encounter: General adherence update   Contacted patient for general health update and medication adherence call.  Spoke with patient on 11/23/2022    What concerns do you have about your medications? Patient reports none at present  The patient denies side effects with their medications.   How often do you forget or accidentally miss a dose? Never  Do you use a pillbox? Yes  Are you having any problems getting your medications from your pharmacy? No  Has the cost of your medications been a concern? No  The patient has not had an ED visit since last contact.   The patient denies problems with their health.   Patient denies concerns or questions for Donna Manning, PharmD at this time. Patient inquired on whom is the prescriber for her omeprazole as she is almost out of it and will be needing a refill soon, advised her of prescribers information and she was in agreement. She is also in agreement to follow up with Pharmacist via phone, scheduled for Aug/2024  Counseled patient on: Great job taking medications and Access to carecoordination team for any cost, medication or pharmacy concerns.   Chart Updates:  Recent office visits:  None  Recent consult visits:  11/17/22 Carylon Perches, DO (ENT) - Patient presented for Flexible Laryngoscopy. No medication changes.   11/08/22 Patient presented to [facility] for US Soft Tissue Head/Neck  11/04/22 [place], Betty G, MD - Patient presented for Odynophagia and other concerns. No medication changes.  Hospital visits:  None in previous 6 months  Medications: Outpatient Encounter Medications as of 11/23/2022  Medication Sig   levothyroxine (SYNTHROID) 88 MCG tablet Take 1 tablet (88 mcg total) by mouth daily.   Multiple Vitamins-Minerals (MULTIVITAMIN ADULTS 50+ PO) Take 1  tablet by mouth daily.   omeprazole (PRILOSEC) 40 MG capsule TAKE 1 CAPSULE BY MOUTH TWICE A DAY   prednisoLONE acetate (PRED FORTE) 1 % ophthalmic suspension 1 drop 2 (two) times daily.   rosuvastatin (CRESTOR) 10 MG tablet Take 1 tablet (10 mg total) by mouth daily. Pt.needs an appt.to receive anymore refills.Thank You. 3rd and final Attempt.   tamoxifen (NOLVADEX) 20 MG tablet Take 1 tablet (20 mg total) by mouth daily.   No facility-administered encounter medications on file as of 11/23/2022.    Recent vitals BP Readings from Last 3 Encounters:  11/04/22 120/70  06/13/22 135/64  05/03/22 (!) 108/50   Pulse Readings from Last 3 Encounters:  11/04/22 88  06/13/22 89  05/03/22 76   Wt Readings from Last 3 Encounters:  11/04/22 173 lb 8 oz (78.7 kg)  06/13/22 169 lb 9.6 oz (76.9 kg)  05/03/22 169 lb (76.7 kg)   BMI Readings from Last 3 Encounters:  11/04/22 27.58 kg/m  06/13/22 26.96 kg/m  05/03/22 26.87 kg/m    Recent lab results    Component Value Date/Time   NA 139 07/21/2021 1157   NA 142 09/08/2020 1515   NA 141 09/08/2016 1007   K 3.9 07/21/2021 1157   K 4.3 09/08/2016 1007   CL 104 07/21/2021 1157   CO2 27 07/21/2021 1157   CO2 26 09/08/2016 1007   GLUCOSE 100 (H) 07/21/2021 1157   GLUCOSE 104 09/08/2016 1007   BUN 13 07/21/2021 1157   BUN 15 09/08/2020 1515   BUN 12.4 09/08/2016 1007  CREATININE 0.81 07/21/2021 1157   CREATININE 0.70 07/07/2020 1024   CREATININE 0.8 09/08/2016 1007   CALCIUM 9.0 07/21/2021 1157   CALCIUM 9.1 09/08/2016 1007    Lab Results  Component Value Date   CREATININE 0.81 07/21/2021   GFR 72.83 07/21/2021   EGFR 77 (L) 09/08/2016   GFRNONAA 71 09/08/2020   GFRAA 81 09/08/2020   Lab Results  Component Value Date/Time   HGBA1C 6.2 07/21/2021 11:57 AM   HGBA1C 6.2 09/06/2019 11:47 AM    Lab Results  Component Value Date   CHOL 122 12/01/2021   HDL 58 12/01/2021   LDLCALC 44 12/01/2021   LDLDIRECT 147.2 11/23/2011    TRIG 113 12/01/2021   CHOLHDL 2.1 12/01/2021    Care Gaps: TDAP - Overdue COVID Booster - Overdue AWV - 05/03/22  Star Rating Drugs:  Rosuvastatin 10 mg -  Last filled 11/12/22 90 DS at [facility]     Pamala Duffel CMA Clinical Pharmacist Assistant [phone]

## 2022-11-23 NOTE — Progress Notes (Signed)
A user error has taken place: encounter opened in error, closed for administrative reasons.

## 2022-12-05 ENCOUNTER — Ambulatory Visit (INDEPENDENT_AMBULATORY_CARE_PROVIDER_SITE_OTHER): Payer: Medicare Other | Admitting: Physician Assistant

## 2022-12-05 ENCOUNTER — Encounter: Payer: Self-pay | Admitting: Physician Assistant

## 2022-12-05 DIAGNOSIS — R070 Pain in throat: Secondary | ICD-10-CM

## 2022-12-05 DIAGNOSIS — D126 Benign neoplasm of colon, unspecified: Secondary | ICD-10-CM | POA: Diagnosis not present

## 2022-12-05 DIAGNOSIS — K219 Gastro-esophageal reflux disease without esophagitis: Secondary | ICD-10-CM

## 2022-12-05 DIAGNOSIS — E041 Nontoxic single thyroid nodule: Secondary | ICD-10-CM

## 2022-12-05 MED ORDER — NA SULFATE-K SULFATE-MG SULF 17.5-3.13-1.6 GM/177ML PO SOLN: 1.0000 | ORAL | 0 refills | Status: DC

## 2022-12-05 MED ORDER — OMEPRAZOLE 40 MG PO CPDR: 40.0000 mg | DELAYED_RELEASE_CAPSULE | Freq: Two times a day (BID) | ORAL | 6 refills | Status: DC

## 2022-12-05 NOTE — Progress Notes (Signed)
Subjective:    Patient ID: Donna Manning, female    DOB: 01-03-1951, 72 y.o.   MRN: [id number]  HPI  Donna Manning is a pleasant 72 year old white female, established with Dr. Lavon Paganini who comes in today to discuss possible endoscopy. Patient has history of chronic GERD, and prior diagnosis of Barrett's esophagus made elsewhere.  She last had endoscopy here in December 2021 and was found to have grade B esophagitis, biopsies from the GE junction showed no evidence of intestinal metaplasia/Barrett's esophagus.  Changes were consistent with reflux. She also has history of adenomatous colon polyps.  Last colonoscopy August 2021 with scattered diverticulosis, external and internal hemorrhoids and she had removal of a 10 mm polyp from the ascending colon which path showed to be a sessile serrated polyp without high-grade dysplasia and was indicated for 3-year interval follow-up.  Patient has history of breast cancer stage IIa.  She has been on omeprazole 40 mg at bedtime chronically. She describes onset of sharp pain in her throat which started about 6 weeks ago.  She says the pain was sharp and uncomfortable for 7 to 10 days and then became more dull.  It persisted for about 3 weeks and then she saw her primary care doctor who scheduled a thyroid ultrasound which did show a thyroid nodule on the right, and per report recommendation was for follow-up imaging in 1 year.  Nodule described as a TI RADS 3 nodule on the right isthmus.  She was referred to ENT and saw Dr. Peggye Pitt on 11/17/2022-I reviewed her notes..  Patient had mentioned that she has difficulty projecting her voice and that she has a sore palate, had been at times having an increased discomfort in the throat with talking. 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  Patient says she is  scheduled to see speech therapy tomorrow, but she feels uncertain that muscle tension dysphonia is the etiology of her symptoms.  She made appointment here because she felt perhaps her reflux was causing problems.  She is not having any specific reflux symptoms at present, unaware of any sour brash, no nocturnal symptoms.  She denies any dysphagia or odynophagia.  Review of Systems Pertinent positive and negative review of systems were noted in the above HPI section.  All other review of systems was otherwise negative.   Outpatient Encounter Medications as of 12/05/2022  Medication Sig   levothyroxine (SYNTHROID) 88 MCG tablet Take 1 tablet (88 mcg total) by mouth daily.   Multiple Vitamins-Minerals (MULTIVITAMIN ADULTS 50+ PO) Take 1 tablet by mouth daily.   Na Sulfate-K Sulfate-Mg Sulf (SUPREP BOWEL PREP KIT) 17.5-3.13-1.6 GM/177ML SOLN Take 1 kit by mouth as directed.   prednisoLONE acetate (PRED FORTE) 1 % ophthalmic suspension 1 drop 2 (two) times daily.   rosuvastatin (CRESTOR) 10 MG tablet Take 1 tablet (10 mg total) by mouth daily. Pt.needs an appt.to receive anymore refills.Thank You. 3rd and final Attempt.   tamoxifen (NOLVADEX) 20 MG tablet Take 1 tablet (20 mg total) by mouth daily.   [DISCONTINUED] omeprazole (PRILOSEC) 40 MG capsule TAKE 1 CAPSULE BY MOUTH TWICE A DAY   omeprazole (PRILOSEC) 40 MG capsule Take 1 capsule (40 mg total) by mouth 2 (two) times daily.   No facility-administered encounter medications on file as of 12/05/2022.   Allergies  Allergen Reactions   Contrast Media [Iodinated Contrast Media] Hives and Shortness Of  Breath   Actonel [Risedronate Sodium] Other (See Comments)    HIP PAIN   Starch Rash    IN LATEX GLOVES   Patient Active Problem List   Diagnosis Date Noted   Barrett's esophagus 06/20/2021   Aspiration into airway 04/24/2018   Dyspepsia 10/27/2014   Colon cancer screening 10/27/2014   Breast cancer of upper-outer quadrant of right female  breast (HCC) 12/31/2013   Visit for preventive health examination 12/03/2011   High triglycerides 12/03/2011   Osteopenia    CAROTID BRUIT 07/16/2010   Carotid bruit 07/16/2010   WEIGHT LOSS 06/24/2010   NONSPECIFIC ABNORMAL ELECTROCARDIOGRAM 06/24/2010   SKIN LESION, UNCERTAIN SIGNIFICANCE 06/24/2010   Solar keratosis 06/24/2010   FATIGUE 06/01/2009   CHEST PAIN, ATYPICAL 06/01/2009   HERPES LABIALIS 12/10/2008   DYSFUNCTION OF EUSTACHIAN TUBE 12/10/2008   ESOPHAGITIS 06/21/2008   Esophagitis 06/21/2008   Hypothyroidism 05/12/2008   ADVERSE REACTION TO MEDICATION 05/12/2008   Hyperlipidemia 04/24/2007   GERD 04/24/2007   OSTEOPENIA 04/24/2007   DVT, HX OF 04/24/2007   Social History   Socioeconomic History   Marital status: Married    Spouse name: Not on file   Number of children: 5   Years of education: Not on file   Highest education level: Not on file  Occupational History   Occupation: homemaker  Tobacco Use   Smoking status: Never   Smokeless tobacco: Never  Vaping Use   Vaping Use: Never used  Substance and Sexual Activity   Alcohol use: Not Currently    Comment: 04/24/2018 "drank a few beers in college"   Drug use: Never   Sexual activity: Not Currently  Other Topics Concern   Not on file  Social History Narrative   Not on file   Social Determinants of Health   Financial Resource Strain: Low Risk  (05/03/2022)   Overall Financial Resource Strain (CARDIA)    Difficulty of Paying Living Expenses: Not hard at all  Food Insecurity: No Food Insecurity (05/03/2022)   Hunger Vital Sign    Worried About Radiation protection practitioner of Food in the Last Year: Never true    Ran Out of Food in the Last Year: Never true  Transportation Needs: No Transportation Needs (05/03/2022)   PRAPARE - Administrator, Civil Service (Medical): No    Lack of Transportation (Non-Medical): No  Physical Activity: Sufficiently Active (05/03/2022)   Exercise Vital Sign    Days of  Exercise per Week: 5 days    Minutes of Exercise per Session: 30 min  Stress: No Stress Concern Present (05/03/2022)   [employer] of Occupational Health - Occupational Stress Questionnaire    Feeling of Stress : Not at all  Social Connections: Socially Integrated (04/20/2021)   Social Connection and Isolation Panel [NHANES]    Frequency of Communication with Friends and Family: More than three times a week    Frequency of Social Gatherings with Friends and Family: More than three times a week    Attends Religious Services: 1 to 4 times per year    Active Member of [employer] or Organizations: Yes    Attends Banker Meetings: 1 to 4 times per year    Marital Status: Married  Catering manager Violence: Not At Risk (04/20/2021)   Humiliation, Afraid, Rape, and Kick questionnaire    Fear of Current or Ex-Partner: No    Emotionally Abused: No    Physically Abused: No    Sexually Abused: No    Ms.  Brandel's family history includes Diabetes in her mother; Heart failure (age of onset: 20) in her mother; Parkinsonism (age of onset: 87) in her father; Prostate cancer in her brother; Stomach cancer in her paternal grandfather; Stroke in her father; Thyroid disease in her sister.      Objective:    Vitals:   12/05/22 1516  Pulse: 95    Physical Exam Well-developed well-nourished early white female in no acute distress.  Height, Weight 176, BMI 27.5  HEENT; nontraumatic normocephalic, EOMI, PE R LA, sclera anicteric. Oropharynx; tongue unremarkable, upper palate unremarkable Neck; supple, no JVD, thyroid nodule palpable on the right, nontender to palpation. Cardiovascular; regular rate and rhythm with S1-S2, no murmur rub or gallop Pulmonary; Clear bilaterally Abdomen; soft, nontender, nondistended, no palpable mass or hepatosplenomegaly, bowel sounds are active Rectal; not done Skin; benign exam, no jaundice rash or appreciable lesions Extremities; no clubbing cyanosis or  edema skin warm and dry Neuro/Psych; alert and oriented x4, grossly nonfocal mood and affect appropriate        Assessment & Plan:   #2 72 year old white female with history of chronic GERD, maintained on omeprazole 40 mg nightly. Last EGD December 2021 with finding of grade B esophagitis, biopsies from the GE junction showed changes consistent with reflux but no intestinal metaplasia to suggest Barrett's. Patient had a prior diagnosis of Barrett's esophagus made elsewhere.  #2 new throat pain onset about 6 weeks ago initially sharp, now still present but dull and sometimes exacerbated by talking She also describes a soreness of her upper palate. 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  I am not convinced by her symptoms that her new throat pain is related to GERD and suspect this is more related to finding of muscle tension dysphonia.  However she has had chronic GERD and did have grade B esophagitis at prior EGD.  #3 history of colon polyps-last colonoscopy August 2021 with a 10 mm sessile serrated polyp removed-indicated for 3-year interval follow-up 4.  Diverticulosis 5.  History of breast cancer stage IIa  Plan; will increase omeprazole to 40 mg p.o. twice daily AC x 8 weeks, then reassess if no change likely reduce to 40 mg p.o. nightly.  Suggest that she follow through with speech therapy consultation, and discussed with her PCP Dr. Fabian Sharp if she is unhappy with that diagnosis  We will plan for colonoscopy and EGD to be done August 2024,  both procedures scheduled today with Dr. Lavon Paganini .Both procedures were discussed in detail with the patient including indications risk and benefits and she is agreeable to proceed.  Sammuel Cooper PA-C 12/05/2022   Cc: Madelin Headings, MD

## 2022-12-05 NOTE — Patient Instructions (Addendum)
_______________________________________________________  If your blood pressure at your visit was 140/90 or greater, please contact your primary care physician to follow up on this. _______________________________________________________  If you are age 73 or older, your body mass index should be between 23-30. Your Body mass index is 27.57 kg/m. If this is out of the aforementioned range listed, please consider follow up with your Primary Care Provider. ________________________________________________________  The [facility] providers would like to encourage you to use [facility] to communicate with providers for non-urgent requests or questions.  Due to long hold times on the telephone, sending your provider a message by [facility] may be a faster and more efficient way to get a response.  Please allow 48 business hours for a response.  Please remember that this is for non-urgent requests.  _______________________________________________________  INCREASE: Omeprazole 40mg  one capsule two times daily 30 minutes prior each meal.  You have been scheduled for an endoscopy and colonoscopy. Please follow the written instructions given to you at your visit today. Please pick up your prep supplies at the pharmacy within the next 1-3 days. If you use inhalers (even only as needed), please bring them with you on the day of your procedure.  Due to recent changes in healthcare laws, you may see the results of your imaging and laboratory studies on MyChart before your provider has had a chance to review them.  We understand that in some cases there may be results that are confusing or concerning to you. Not all laboratory results come back in the same time frame and the provider may be waiting for multiple results in order to interpret others.  Please give [place] 48 hours in order for your provider to thoroughly review all the results before contacting the office for clarification of your results.   Thank you for  entrusting me with your care and choosing [facility].  Amy Esterwood, PA-C

## 2022-12-06 ENCOUNTER — Telehealth: Payer: Self-pay | Admitting: Internal Medicine

## 2022-12-06 ENCOUNTER — Ambulatory Visit: Payer: Medicare Other

## 2022-12-06 ENCOUNTER — Telehealth: Payer: Self-pay | Admitting: Physician Assistant

## 2022-12-06 DIAGNOSIS — R490 Dysphonia: Secondary | ICD-10-CM

## 2022-12-06 DIAGNOSIS — R1310 Dysphagia, unspecified: Secondary | ICD-10-CM

## 2022-12-06 NOTE — Telephone Encounter (Signed)
Left message for pt to call back.  Pt states yesterday she was told she did not have a dx of barretts esophagus. Pt went back to her mychart and from her EGD done in 2021 pt states she saw where it said Barretts esophagus without dysplasia. Pt states she is confused and wants to clarify. Please advise.,

## 2022-12-06 NOTE — Telephone Encounter (Signed)
Inbound call from patient requesting a call back from a nurse. Patient has some question regarding yesterday appt.Please.advise.

## 2022-12-06 NOTE — Therapy (Signed)
[facility] [address], [address] [place], [place], [address] Phone: [phone]   Fax:  [phone]  Patient Details  Name: MARSELLA SZOTT MRN: [id number] Date of Birth: Aug 06, 1950 Referring Provider:  Laren Boom, DO  Encounter Date: 12/06/2022  ST - Arrive/Cancel During pt case hx/interview, pt introduced the thought of getting a second opinion about the pain in her throat since late March, stating that she hasn't thought there has been much change in vocal quality compared to before sharp intense throat pain in late March. Pt asked who SLP would recommend for second opinion. SLP told pt about [facility] and [facility], and provided information on [facility] per [facility] request. She and SLP decided to wait on the ST evaluation until pt obtains information from a second opinion.  Letica Giaimo, CCC-SLP 12/06/2022, 10:50 AM  [facility] [address], [address] [place], [place], [address] Phone: [phone]   Fax:  [phone]

## 2022-12-06 NOTE — Telephone Encounter (Signed)
Pt came in and stated Dr. Verdie Mosher had recommended she get a second opinion for her Dysphonia. Pt saw Dr. Don Perking at [facility]. Pt states she would like to be referred to Dr. Maryfrances Bunnell or Dr. Rayford Halsted, at [facility]. Call back number with questions: [phone].

## 2022-12-07 NOTE — Telephone Encounter (Signed)
Ok with me  to get  second opinion referral

## 2022-12-07 NOTE — Telephone Encounter (Signed)
Spoke with patient and she is aware of comments per Mike Gip PA. She will have EGD done in August.

## 2022-12-08 NOTE — Telephone Encounter (Signed)
A referral is placed 

## 2022-12-08 NOTE — Telephone Encounter (Signed)
Spoke to pt and inform updated information. Pt is aware someone will contact her to schedule an appt. Verbalized understanding.

## 2022-12-26 ENCOUNTER — Telehealth: Payer: Self-pay

## 2022-12-26 ENCOUNTER — Ambulatory Visit (HOSPITAL_BASED_OUTPATIENT_CLINIC_OR_DEPARTMENT_OTHER)
Admission: RE | Admit: 2022-12-26 | Discharge: 2022-12-26 | Disposition: A | Discharge status: Home / Self Care | Payer: Medicare Other | Source: Ambulatory Visit | Attending: Hematology and Oncology | Admitting: Hematology and Oncology

## 2022-12-26 DIAGNOSIS — M810 Age-related osteoporosis without current pathological fracture: Secondary | ICD-10-CM | POA: Diagnosis not present

## 2022-12-26 DIAGNOSIS — Z170 Estrogen receptor positive status [ER+]: Secondary | ICD-10-CM | POA: Diagnosis not present

## 2022-12-26 DIAGNOSIS — Z780 Asymptomatic menopausal state: Secondary | ICD-10-CM | POA: Diagnosis not present

## 2022-12-26 DIAGNOSIS — C50411 Malignant neoplasm of upper-outer quadrant of right female breast: Secondary | ICD-10-CM | POA: Diagnosis not present

## 2022-12-26 NOTE — Telephone Encounter (Signed)
Called pt per NP and reviewed results. Pt was educated on importance of Calcium & Vitamin D supplement. She denies taking ca & vit D. Encouraged supplement and weight bearing exercises. She will f/u with MD in November,

## 2022-12-26 NOTE — Telephone Encounter (Signed)
-----   Message from Loa Socks, NP sent at 12/26/2022 12:43 PM EDT ----- Bone density is slightly worse.  She can wait and talk to dr. Pamelia Hoit about this in November, or we can see her sooner at next available opening if she wishes.   ----- Message ----- From: Interface, Rad Results In Sent: 12/26/2022  10:13 AM EDT To: Serena Croissant, MD

## 2023-01-09 NOTE — Progress Notes (Unsigned)
Cardiology Office Note:  .   Date:  01/10/2023  ID:  Donna Manning, DOB 1951-03-14, MRN [id number] PCP: Madelin Headings, MD  [facility] Providers Cardiologist:  Dietrich Pates, MD {    History of Present Illness: Donna Manning is a 72 y.o. female with a past medical history of chest pain, GERD, history of DVT after pregnancy (1980), hyperlipidemia, breast cancer 2017, hypothyroidism, irregular heartbeat who presents for follow-up appointment.  Was seen May 2020 and prior history was experiencing chest pain that lasted minutes at night about 3 times a week.  Would awaken her.  Off-and-on.  Was left-sided with occasional jaw discomfort.  Patient at that time thought the jaw discomfort was related to teeth clenching also has some left arm pain.  With activity the patient did not have any spells.  Felt to be noncardiac.  Patient was seen January 2021.  At that time she had some shortness of breath.  Had some wheezing on exam.  Was set up for an echocardiogram and this was normal.  She was also set up for coronary CT angio which was done with calcium score of 0 and normal coronary arteries.  Patient called into PCPs office complaining of left-sided chest pain and was told to make an appoint with cardiology.  She says about a week prior she started having left-sided chest pain which was constant and worse at night.  Denied injury, fevers, chills, cough.  Worse with movement and with associated left arm pain.  She was last seen by Dr. Tenny Craw 09/08/2020 and at that time chest pain was consistent with musculoskeletal.  Very tender to palpate along the left ribs.  No injury.  Lung exam also abnormal.  Labs ordered with CXR.  Echocardiogram also ordered at that time.  Today, she tells that she has sharp pains in her chest.  Nonexertional.  No shortness of breath, nausea/vomiting, no racing heartbeat.  She is under a lot of stress currently since her husband has Alzheimer's and she is the sole  caregiver.  She is getting labs drawn in November and lipid panel will be checked at this time.  Last LDL was 44.  She was worked up back in 2022 for chest pain and it was consistent with musculoskeletal discomfort.  At that time CTA of the chest was ordered and she had a calcium score of 0.  Reports no shortness of breath nor dyspnea on exertion. Reports no chest pain, pressure, or tightness. No edema, orthopnea, PND. Reports no palpitations.     ROS: Pertinent ROS is in HPI  Studies Reviewed: Marland Kitchen       EKG showed normal sinus rhythm, LVH, heart rate 85 bpm  Coronary CT scan 09/12/2019 IMPRESSION: 1. Coronary calcium score of 0. This was 0 percentile for age and sex matched control.   2.  Right dominance.  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   3. No evidence of CAD.       Physical Exam:   VS:  BP (!) 126/58   Pulse 85   Ht 5\' 7"  (1.702 m)   Wt 176 lb (79.8 kg)   SpO2 95%   BMI 27.57 kg/m    Wt Readings from Last 3 Encounters:  01/10/23 176 lb (79.8 kg)  12/05/22 176 lb (79.8 kg)  11/04/22 173 lb 8 oz (78.7 kg)    GEN: Well nourished, well developed in no acute distress NECK: No JVD; No carotid bruits CARDIAC: RRR, no  murmurs, rubs, gallops RESPIRATORY:  Clear to auscultation without rales, wheezing or rhonchi  ABDOMEN: Soft, non-tender, non-distended EXTREMITIES:  No edema; No deformity   ASSESSMENT AND PLAN: .   1.  Chest pain -Recent CTA reviewed with the patient -Will update echocardiogram, if any major changes then we will pursue an ischemic workup -Continue Crestor 10 mg daily -LDL 44, will redraw a lipid panel with PCP this fall  2.  Hyperlipidemia -Continue Crestor 10 mg daily -Update lipid panel in the fall -Continue low-sodium, heart healthy diet  3.  Hypertension -Well-controlled today, 126/58 -Continue current medication regimen -Continue to track blood pressure at home  4. Breast CA -Remains on tamoxifen -No recent issues, follows with  oncology  5. Caregiver burden -Husband with advanced Alzheimer's -She does have a support group that she attends       Dispo: Follow-up in 1 year with Dr. Tenny Craw  Signed, Sharlene Dory, PA-C

## 2023-01-10 ENCOUNTER — Ambulatory Visit: Payer: Medicare Other | Attending: Physician Assistant | Admitting: Physician Assistant

## 2023-01-10 ENCOUNTER — Encounter: Payer: Self-pay | Admitting: Physician Assistant

## 2023-01-10 VITALS — BP 126/58 | HR 85 | Ht 67.0 in | Wt 176.0 lb

## 2023-01-10 DIAGNOSIS — I10 Essential (primary) hypertension: Secondary | ICD-10-CM | POA: Insufficient documentation

## 2023-01-10 DIAGNOSIS — I2510 Atherosclerotic heart disease of native coronary artery without angina pectoris: Secondary | ICD-10-CM | POA: Diagnosis not present

## 2023-01-10 DIAGNOSIS — E785 Hyperlipidemia, unspecified: Secondary | ICD-10-CM | POA: Insufficient documentation

## 2023-01-10 DIAGNOSIS — R079 Chest pain, unspecified: Secondary | ICD-10-CM | POA: Insufficient documentation

## 2023-01-10 MED ORDER — ROSUVASTATIN CALCIUM 10 MG PO TABS
10.0000 mg | ORAL_TABLET | Freq: Every day | ORAL | 3 refills | Status: DC
Start: 2023-01-10 — End: 2024-01-22

## 2023-01-10 NOTE — Patient Instructions (Addendum)
Medication Instructions:   Your physician recommends that you continue on your current medications as directed. Please refer to the Current Medication list given to you today.  *If you need a refill on your cardiac medications before your next appointment, please call your pharmacy*   Lab Work: NONE ORDERED  TODAY   If you have labs (blood work) drawn today and your tests are completely normal, you will receive your results only by: MyChart Message (if you have MyChart) OR A paper copy in the mail If you have any lab test that is abnormal or we need to change your treatment, we will call you to review the results.   Testing/Procedures:  Your physician has requested that you have an echocardiogram. Echocardiography is a painless test that uses sound waves to create images of your heart. It provides your doctor with information about the size and shape of your heart and how well your heart's chambers and valves are working. This procedure takes approximately one hour. There are no restrictions for this procedure. Please do NOT wear cologne, perfume, aftershave, or lotions (deodorant is allowed). Please arrive 15 minutes prior to your appointment time.      Follow-Up: At [facility], you and your health needs are our priority.  As part of our continuing mission to provide you with exceptional heart care, we have created designated Provider Care Teams.  These Care Teams include your primary Cardiologist (physician) and Advanced Practice Providers (APPs -  Physician Assistants and Nurse Practitioners) who all work together to provide you with the care you need, when you need it.  We recommend signing up for the patient portal called "MyChart".  Sign up information is provided on this After Visit Summary.  MyChart is used to connect with patients for Virtual Visits (Telemedicine).  Patients are able to view lab/test results, encounter notes, upcoming appointments, etc.  Non-urgent  messages can be sent to your provider as well.   To learn more about what you can do with MyChart, go to [email or web address].    Your next appointment:   1 year(s)  Provider:   Dietrich Pates, MD     Other Instructions    Low-Sodium Eating Plan Salt (sodium) helps you keep a healthy balance of fluids in your body. Too much sodium can raise your blood pressure. It can also cause fluid and waste to be held in your body. Your health care provider or dietitian may recommend a low-sodium eating plan if you have high blood pressure (hypertension), kidney disease, liver disease, or heart failure. Eating less sodium can help lower your blood pressure and reduce swelling. It can also protect your heart, liver, and kidneys. What are tips for following this plan? Reading food labels  Check food labels for the amount of sodium per serving. If you eat more than one serving, you must multiply the listed amount by the number of servings. Choose foods with less than 140 milligrams (mg) of sodium per serving. Avoid foods with 300 mg of sodium or more per serving. Always check how much sodium is in a product, even if the label says "unsalted" or "no salt added." Shopping  Buy products labeled as "low-sodium" or "no salt added." Buy fresh foods. Avoid canned foods and pre-made or frozen meals. Avoid canned, cured, or processed meats. Buy breads that have less than 80 mg of sodium per slice. Cooking  Eat more home-cooked food. Try to eat less restaurant, buffet, and fast food. Try not to add salt  when you cook. Use salt-free seasonings or herbs instead of table salt or sea salt. Check with your provider or pharmacist before using salt substitutes. Cook with plant-based oils, such as canola, sunflower, or olive oil. Meal planning When eating at a restaurant, ask if your food can be made with less salt or no salt. Avoid dishes labeled as brined, pickled, cured, or smoked. Avoid dishes made with soy  sauce, miso, or teriyaki sauce. Avoid foods that have monosodium glutamate (MSG) in them. MSG may be added to some restaurant food, sauces, soups, bouillon, and canned foods. Make meals that can be grilled, baked, poached, roasted, or steamed. These are often made with less sodium. General information Try to limit your sodium intake to 1,500-2,300 mg each day, or the amount told by your provider. What foods should I eat? Fruits Fresh, frozen, or canned fruit. Fruit juice. Vegetables Fresh or frozen vegetables. "No salt added" canned vegetables. "No salt added" tomato sauce and paste. Low-sodium or reduced-sodium tomato and vegetable juice. Grains Low-sodium cereals, such as oats, puffed wheat and rice, and shredded wheat. Low-sodium crackers. Unsalted rice. Unsalted pasta. Low-sodium bread. Whole grain breads and whole grain pasta. Meats and other proteins Fresh or frozen meat, poultry, seafood, and fish. These should have no added salt. Low-sodium canned tuna and salmon. Unsalted nuts. Dried peas, beans, and lentils without added salt. Unsalted canned beans. Eggs. Unsalted nut butters. Dairy Milk. Soy milk. Cheese that is naturally low in sodium, such as ricotta cheese, fresh mozzarella, or Swiss cheese. Low-sodium or reduced-sodium cheese. Cream cheese. Yogurt. Seasonings and condiments Fresh and dried herbs and spices. Salt-free seasonings. Low-sodium mustard and ketchup. Sodium-free salad dressing. Sodium-free light mayonnaise. Fresh or refrigerated horseradish. Lemon juice. Vinegar. Other foods Homemade, reduced-sodium, or low-sodium soups. Unsalted popcorn and pretzels. Low-salt or salt-free chips. The items listed above may not be all the foods and drinks you can have. Talk to a dietitian to learn more. What foods should I avoid? Vegetables Sauerkraut, pickled vegetables, and relishes. Olives. [place] fries. Onion rings. Regular canned vegetables, except low-sodium or reduced-sodium  items. Regular canned tomato sauce and paste. Regular tomato and vegetable juice. Frozen vegetables in sauces. Grains Instant hot cereals. Bread stuffing, pancake, and biscuit mixes. Croutons. Seasoned rice or pasta mixes. Noodle soup cups. Boxed or frozen macaroni and cheese. Regular salted crackers. Self-rising flour. Meats and other proteins Meat or fish that is salted, canned, smoked, spiced, or pickled. Precooked or cured meat, such as sausages or meat loaves. Tomasa Blase. Ham. Pepperoni. Hot dogs. Corned beef. Chipped beef. Salt pork. Jerky. Pickled herring, anchovies, and sardines. Regular canned tuna. Salted nuts. Dairy Processed cheese and cheese spreads. Hard cheeses. Cheese curds. Blue cheese. Feta cheese. String cheese. Regular cottage cheese. Buttermilk. Canned milk. Fats and oils Salted butter. Regular margarine. Ghee. Bacon fat. Seasonings and condiments Onion salt, garlic salt, seasoned salt, table salt, and sea salt. Canned and packaged gravies. Worcestershire sauce. Tartar sauce. Barbecue sauce. Teriyaki sauce. Soy sauce, including reduced-sodium soy sauce. Steak sauce. Fish sauce. Oyster sauce. Cocktail sauce. Horseradish that you find on the shelf. Regular ketchup and mustard. Meat flavorings and tenderizers. Bouillon cubes. Hot sauce. Pre-made or packaged marinades. Pre-made or packaged taco seasonings. Relishes. Regular salad dressings. Salsa. Other foods Salted popcorn and pretzels. Corn chips and puffs. Potato and tortilla chips. Canned or dried soups. Pizza. Frozen entrees and pot pies. The items listed above may not be all the foods and drinks you should avoid. Talk to a dietitian to  learn more. This information is not intended to replace advice given to you by your health care provider. Make sure you discuss any questions you have with your health care provider. Document Revised: 07/21/2022 Document Reviewed: 07/21/2022 Elsevier Patient Education  2024 Elsevier  Inc.   Heart-Healthy Eating Plan Eating a healthy diet is important for the health of your heart. A heart-healthy eating plan includes: Eating less unhealthy fats. Eating more healthy fats. Eating less salt in your food. Salt is also called sodium. Making other changes in your diet. Talk with your doctor or a diet specialist (dietitian) to create an eating plan that is right for you. What is my plan? Your doctor may recommend an eating plan that includes: Total fat: ______% or less of total calories a day. Saturated fat: ______% or less of total calories a day. Cholesterol: less than _________mg a day. Sodium: less than _________mg a day. What are tips for following this plan? Cooking Avoid frying your food. Try to bake, boil, grill, or broil it instead. You can also reduce fat by: Removing the skin from poultry. Removing all visible fats from meats. Steaming vegetables in water or broth. Meal planning  At meals, divide your plate into four equal parts: Fill one-half of your plate with vegetables and green salads. Fill one-fourth of your plate with whole grains. Fill one-fourth of your plate with lean protein foods. Eat 2-4 cups of vegetables per day. One cup of vegetables is: 1 cup (91 g) broccoli or cauliflower florets. 2 medium carrots. 1 large bell pepper. 1 large sweet potato. 1 large tomato. 1 medium white potato. 2 cups (150 g) raw leafy greens. Eat 1-2 cups of fruit per day. One cup of fruit is: 1 small apple 1 large banana 1 cup (237 g) mixed fruit, 1 large orange,  cup (82 g) dried fruit, 1 cup (240 mL) 100% fruit juice. Eat more foods that have soluble fiber. These are apples, broccoli, carrots, beans, peas, and barley. Try to get 20-30 g of fiber per day. Eat 4-5 servings of nuts, legumes, and seeds per week: 1 serving of dried beans or legumes equals  cup (90 g) cooked. 1 serving of nuts is  oz (12 almonds, 24 pistachios, or 7 walnut halves). 1  serving of seeds equals  oz (8 g). General information Eat more home-cooked food. Eat less restaurant, buffet, and fast food. Limit or avoid alcohol. Limit foods that are high in starch and sugar. Avoid fried foods. Lose weight if you are overweight. Keep track of how much salt (sodium) you eat. This is important if you have high blood pressure. Ask your doctor to tell you more about this. Try to add vegetarian meals each week. Fats Choose healthy fats. These include olive oil and canola oil, flaxseeds, walnuts, almonds, and seeds. Eat more omega-3 fats. These include salmon, mackerel, sardines, tuna, flaxseed oil, and ground flaxseeds. Try to eat fish at least 2 times each week. Check food labels. Avoid foods with trans fats or high amounts of saturated fat. Limit saturated fats. These are often found in animal products, such as meats, butter, and cream. These are also found in plant foods, such as palm oil, palm kernel oil, and coconut oil. Avoid foods with partially hydrogenated oils in them. These have trans fats. Examples are stick margarine, some tub margarines, cookies, crackers, and other baked goods. What foods should I eat? Fruits All fresh, canned (in natural juice), or frozen fruits. Vegetables Fresh or frozen vegetables (raw,  steamed, roasted, or grilled). Green salads. Grains Most grains. Choose whole wheat and whole grains most of the time. Rice and pasta, including brown rice and pastas made with whole wheat. Meats and other proteins Lean, well-trimmed beef, veal, pork, and lamb. Chicken and [place] without skin. All fish and shellfish. Wild duck, rabbit, pheasant, and venison. Egg whites or low-cholesterol egg substitutes. Dried beans, peas, lentils, and tofu. Seeds and most nuts. Dairy Low-fat or nonfat cheeses, including ricotta and mozzarella. Skim or 1% milk that is liquid, powdered, or evaporated. Buttermilk that is made with low-fat milk. Nonfat or low-fat  yogurt. Fats and oils Non-hydrogenated (trans-free) margarines. Vegetable oils, including soybean, sesame, sunflower, olive, peanut, safflower, corn, canola, and cottonseed. Salad dressings or mayonnaise made with a vegetable oil. Beverages Mineral water. Coffee and tea. Diet carbonated beverages. Sweets and desserts Sherbet, gelatin, and fruit ice. Small amounts of dark chocolate. Limit all sweets and desserts. Seasonings and condiments All seasonings and condiments. The items listed above may not be a complete list of foods and drinks you can eat. Contact a dietitian for more options. What foods should I avoid? Fruits Canned fruit in heavy syrup. Fruit in cream or butter sauce. Fried fruit. Limit coconut. Vegetables Vegetables cooked in cheese, cream, or butter sauce. Fried vegetables. Grains Breads that are made with saturated or trans fats, oils, or whole milk. Croissants. Sweet rolls. Donuts. High-fat crackers, such as cheese crackers. Meats and other proteins Fatty meats, such as hot dogs, ribs, sausage, bacon, rib-eye roast or steak. High-fat deli meats, such as salami and bologna. Caviar. Domestic duck and goose. Organ meats, such as liver. Dairy Cream, sour cream, cream cheese, and creamed cottage cheese. Whole-milk cheeses. Whole or 2% milk that is liquid, evaporated, or condensed. Whole buttermilk. Cream sauce or high-fat cheese sauce. Yogurt that is made from whole milk. Fats and oils Meat fat, or shortening. Cocoa butter, hydrogenated oils, palm oil, coconut oil, palm kernel oil. Solid fats and shortenings, including bacon fat, salt pork, lard, and butter. Nondairy cream substitutes. Salad dressings with cheese or sour cream. Beverages Regular sodas and juice drinks with added sugar. Sweets and desserts Frosting. Pudding. Cookies. Cakes. Pies. Milk chocolate or white chocolate. Buttered syrups. Full-fat ice cream or ice cream drinks. The items listed above may not be a  complete list of foods and drinks to avoid. Contact a dietitian for more information. Summary Heart-healthy meal planning includes eating less unhealthy fats, eating more healthy fats, and making other changes in your diet. Eat a balanced diet. This includes fruits and vegetables, low-fat or nonfat dairy, lean protein, nuts and legumes, whole grains, and heart-healthy oils and fats. This information is not intended to replace advice given to you by your health care provider. Make sure you discuss any questions you have with your health care provider. Document Revised: 08/09/2021 Document Reviewed: 08/09/2021 Elsevier Patient Education  2024 [employer].

## 2023-01-31 ENCOUNTER — Ambulatory Visit (HOSPITAL_COMMUNITY): Payer: Medicare Other | Attending: Cardiology

## 2023-01-31 DIAGNOSIS — R079 Chest pain, unspecified: Secondary | ICD-10-CM | POA: Diagnosis not present

## 2023-01-31 LAB — ECHOCARDIOGRAM COMPLETE
Area-P 1/2: 3.06 cm2
S' Lateral: 3.2 cm

## 2023-03-02 ENCOUNTER — Telehealth: Payer: Self-pay | Admitting: Physician Assistant

## 2023-03-02 NOTE — Telephone Encounter (Signed)
I left Olegario Messier a detailed message to call us back. Looks like suprep sent in but instructions are for sutab. I will confirm when she calls back what she has.

## 2023-03-02 NOTE — Telephone Encounter (Signed)
Patient called stated the prep medication is too expensive.

## 2023-03-03 MED ORDER — PLENVU 140 G PO SOLR: 1.0000 | ORAL | Status: DC

## 2023-03-03 MED ORDER — SUTAB 1479-225-188 MG PO TABS: 1.0000 | ORAL_TABLET | ORAL | 0 refills | Status: DC

## 2023-03-03 NOTE — Telephone Encounter (Signed)
I spoke with Olegario Messier and sent in the sutab. Then called [facility] and it was going to cost $178. I told her we have a sample of Plenvu. She will come by 03/06/2023 to pick it up along with the new instructions which we will go over. She hopes to come around 1pm and ask for me.

## 2023-03-06 NOTE — Telephone Encounter (Signed)
Donna Manning came and we went over the instructions and I gave her the plenvu prep.

## 2023-03-09 ENCOUNTER — Ambulatory Visit (AMBULATORY_SURGERY_CENTER): Payer: Medicare Other | Admitting: Gastroenterology

## 2023-03-09 ENCOUNTER — Encounter: Payer: Self-pay | Admitting: Gastroenterology

## 2023-03-09 VITALS — BP 116/53 | HR 79 | Temp 97.0°F | Resp 13 | Ht 67.0 in | Wt 176.0 lb

## 2023-03-09 DIAGNOSIS — D123 Benign neoplasm of transverse colon: Secondary | ICD-10-CM | POA: Diagnosis not present

## 2023-03-09 DIAGNOSIS — D126 Benign neoplasm of colon, unspecified: Secondary | ICD-10-CM

## 2023-03-09 DIAGNOSIS — K219 Gastro-esophageal reflux disease without esophagitis: Secondary | ICD-10-CM

## 2023-03-09 DIAGNOSIS — K2100 Gastro-esophageal reflux disease with esophagitis, without bleeding: Secondary | ICD-10-CM

## 2023-03-09 DIAGNOSIS — Z09 Encounter for follow-up examination after completed treatment for conditions other than malignant neoplasm: Secondary | ICD-10-CM | POA: Diagnosis not present

## 2023-03-09 DIAGNOSIS — Z860100 Personal history of colon polyps, unspecified: Secondary | ICD-10-CM

## 2023-03-09 DIAGNOSIS — E039 Hypothyroidism, unspecified: Secondary | ICD-10-CM | POA: Diagnosis not present

## 2023-03-09 MED ORDER — SODIUM CHLORIDE 0.9 % IV SOLN: 500.0000 mL | Freq: Once | INTRAVENOUS | Status: AC

## 2023-03-09 MED ORDER — OMEPRAZOLE 40 MG PO CPDR: 40.0000 mg | DELAYED_RELEASE_CAPSULE | Freq: Every day | ORAL | 3 refills | Status: DC

## 2023-03-09 NOTE — Progress Notes (Signed)
Sedate, gd SR, tolerated procedure well, VSS, report to RN 

## 2023-03-09 NOTE — Progress Notes (Signed)
[facility] History and Physical   Primary Care Physician:  Madelin Headings, MD   Reason for Procedure:  GERD with erosive esophagitis, Barrett's and h/o colon polyps  Plan:    Surveillance EGD and colonoscopy with possible interventions as needed     HPI: Donna Manning is a very pleasant 72 y.o. female here for GERD with erosive esophagitis, Barrett's and h/o colon polyps.   The risks and benefits as well as alternatives of endoscopic procedure(s) have been discussed and reviewed. All questions answered. The patient agrees to proceed.    Past Medical History:  Diagnosis Date   Atypical lobular hyperplasia [facility]) of left breast 03/2018   Barrett's esophagus 2009   Benign head tremor    Breast cancer (HCC)    Breast cancer, right breast (HCC) 01/2014   "had 5 weeks radiation after lumpectomy" (04/24/2018)   Dental bridge present    lower left   Dental crowns present    GERD (gastroesophageal reflux disease)    History of angina    History of DVT (deep vein thrombosis) ~ 1980   after a pregnancy   History of radiation therapy 2015   Hyperlipidemia    Hypothyroidism    Irregular heart beat    "dx'd by 1 dr; not noted by the cardiologist" (04/24/2018)   NSAID-induced gastric ulcer    Osteoporosis    Personal history of radiation therapy    PONV (postoperative nausea and vomiting)     Past Surgical History:  Procedure Laterality Date   BREAST BIOPSY Right 1991   BREAST EXCISIONAL BIOPSY Left 2019   BREAST LUMPECTOMY Right 1991   BREAST LUMPECTOMY W/ NEEDLE LOCALIZATION Left 04/24/2018   BREAST LUMPECTOMY WITH NEEDLE LOCALIZATION Left 04/24/2018   Procedure: LEFT BREAST LUMPECTOMY WITH NEEDLE LOCALIZATION;  Surgeon: Harriette Bouillon, MD;  Location: [facility];  Service: General;  Laterality: Left;   COLONOSCOPY     "w/hemorrhoid banding"   COLOSTOMY  2009   PARTIAL MASTECTOMY WITH NEEDLE LOCALIZATION AND AXILLARY SENTINEL LYMPH NODE BX  Right 01/20/2014   Procedure: RIGHT PARTIAL MASTECTOMY WITH DOUBLE  NEEDLE LOCALIZATION (BRACKETED )AND AXILLARY SENTINEL LYMPH NODE BX;  Surgeon: Ernestene Mention, MD;  Location: [facility];  Service: General;  Laterality: Right;  And [place].   RE-EXCISION OF BREAST CANCER,SUPERIOR MARGINS Right 02/05/2014   Procedure: RIGHT LUMPECTOMY, RE-EXCISION OF BREAST CANCER,SUPERIOR MARGINS;  Surgeon: Ernestene Mention, MD;  Location: [facility];  Service: General;  Laterality: Right;   UPPER GASTROINTESTINAL ENDOSCOPY  2016   UPPER GI ENDOSCOPY  12/25/2014   with Propofol    Prior to Admission medications   Medication Sig Start Date End Date Taking? Authorizing Provider  Calcium Carbonate-Vit D-Min (CALCIUM 1200 PO) Take by mouth daily at 6 (six) AM.   Yes [provider]  cetirizine (ZYRTEC) 5 MG tablet Take by mouth as needed for allergies.   Yes [provider]  Cholecalciferol (VITAMIN D) 50 MCG (2000 UT) CAPS Take by mouth daily at 6 (six) AM.   Yes [provider]  levothyroxine (SYNTHROID) 88 MCG tablet Take 1 tablet (88 mcg total) by mouth daily. 11/16/22  Yes [name], Neta Mends, MD  Multiple Vitamins-Minerals (MULTIVITAMIN ADULTS 50+ PO) Take 1 tablet by mouth daily.   Yes [provider]  rosuvastatin (CRESTOR) 10 MG tablet Take 1 tablet (10 mg total) by mouth daily. 01/10/23  Yes Conte, Tessa N, PA-C  tamoxifen (NOLVADEX) 20 MG tablet Take  1 tablet (20 mg total) by mouth daily. 06/13/22  Yes Serena Croissant, MD  omeprazole (PRILOSEC) 40 MG capsule Take 1 capsule (40 mg total) by mouth 2 (two) times daily. 12/05/22   Esterwood, Amy S, PA-C    Current Outpatient Medications  Medication Sig Dispense Refill   Calcium Carbonate-Vit D-Min (CALCIUM 1200 PO) Take by mouth daily at 6 (six) AM.     cetirizine (ZYRTEC) 5 MG tablet Take by mouth as needed for allergies.     Cholecalciferol (VITAMIN D) 50 MCG (2000 UT) CAPS Take by mouth daily at 6  (six) AM.     levothyroxine (SYNTHROID) 88 MCG tablet Take 1 tablet (88 mcg total) by mouth daily. 90 tablet 0   Multiple Vitamins-Minerals (MULTIVITAMIN ADULTS 50+ PO) Take 1 tablet by mouth daily.     rosuvastatin (CRESTOR) 10 MG tablet Take 1 tablet (10 mg total) by mouth daily. 90 tablet 3   tamoxifen (NOLVADEX) 20 MG tablet Take 1 tablet (20 mg total) by mouth daily. 90 tablet 3   omeprazole (PRILOSEC) 40 MG capsule Take 1 capsule (40 mg total) by mouth 2 (two) times daily. 60 capsule 6   Current Facility-Administered Medications  Medication Dose Route Frequency Provider Last Rate Last Admin   0.9 %  sodium chloride infusion  500 mL Intravenous Once Napoleon Form, MD        Allergies as of 03/09/2023 - Review Complete 03/09/2023  Allergen Reaction Noted   Contrast media [iodinated contrast media] Hives and Shortness Of Breath 01/13/2014   Actonel [risedronate sodium] Other (See Comments) 12/03/2011   Starch Rash 01/01/2014    Family History  Problem Relation Age of Onset   Heart failure Mother 36   Diabetes Mother    Parkinsonism Father 71   Stroke Father    Thyroid disease Sister    Prostate cancer Brother    Stomach cancer Paternal Grandfather    Colon polyps Neg Hx    Crohn's disease Neg Hx    Esophageal cancer Neg Hx    Rectal cancer Neg Hx    Colon cancer Neg Hx     Social History   Socioeconomic History   Marital status: Married    Spouse name: Not on file   Number of children: 5   Years of education: Not on file   Highest education level: Not on file  Occupational History   Occupation: homemaker  Tobacco Use   Smoking status: Never   Smokeless tobacco: Never  Vaping Use   Vaping status: Never Used  Substance and Sexual Activity   Alcohol use: Not Currently    Comment: 04/24/2018 "drank a few beers in college"   Drug use: Never   Sexual activity: Not Currently  Other Topics Concern   Not on file  Social History Narrative   Not on file    Social Determinants of Health   Financial Resource Strain: Low Risk  (05/03/2022)   Overall Financial Resource Strain (CARDIA)    Difficulty of Paying Living Expenses: Not hard at all  Food Insecurity: No Food Insecurity (05/03/2022)   Hunger Vital Sign    Worried About Running Out of Food in the Last Year: Never true    Ran Out of Food in the Last Year: Never true  Transportation Needs: No Transportation Needs (05/03/2022)   PRAPARE - Administrator, Civil Service (Medical): No    Lack of Transportation (Non-Medical): No  Physical Activity: Sufficiently Active (05/03/2022)   Exercise  Vital Sign    Days of Exercise per Week: 5 days    Minutes of Exercise per Session: 30 min  Stress: No Stress Concern Present (05/03/2022)   [employer] of Occupational Health - Occupational Stress Questionnaire    Feeling of Stress : Not at all  Social Connections: Unknown (11/30/2021)   [facility] from [facility]   Social Network    Social Network: Not on file  Intimate Partner Violence: Unknown (10/22/2021)   Received from [facility]   HITS    Physically Hurt: Not on file    Insult or Talk Down To: Not on file    Threaten Physical Harm: Not on file    Scream or Curse: Not on file    Review of Systems:  All other review of systems negative except as mentioned in the HPI.  Physical Exam: Vital signs in last 24 hours: BP 129/72   Pulse 81   Temp (!) 97 F (36.1 C)   Ht 5\' 7"  (1.702 m)   Wt 176 lb (79.8 kg)   SpO2 95%   BMI 27.57 kg/m  General:   Alert, NAD Lungs:  Clear .   Heart:  Regular rate and rhythm Abdomen:  Soft, nontender and nondistended. Neuro/Psych:  Alert and cooperative. Normal mood and affect. A and O x 3  Reviewed labs, radiology imaging, old records and pertinent past GI work up  Patient is appropriate for planned procedure(s) and anesthesia in an ambulatory setting   K. Scherry Ran , MD [phone]

## 2023-03-09 NOTE — Progress Notes (Signed)
Called to room to assist during endoscopic procedure.  Patient ID and intended procedure confirmed with present staff. Received instructions for my participation in the procedure from the performing physician.  

## 2023-03-09 NOTE — Op Note (Signed)
[facility] Patient Name: Donna Manning Procedure Date: 03/09/2023 8:33 AM MRN: [id number] Endoscopist: Napoleon Form , MD, [id number] Age: 72 Referring MD:  Date of Birth: 09-Oct-1950 Gender: Female Account #: [id number] Procedure:                Upper GI endoscopy Indications:              Follow-up of reflux esophagitis, Exclusion of                            Barrett's esophagus Medicines:                Monitored Anesthesia Care Procedure:                Pre-Anesthesia Assessment:                           - Prior to the procedure, a History and Physical                            was performed, and patient medications and                            allergies were reviewed. The patient's tolerance of                            previous anesthesia was also reviewed. The risks                            and benefits of the procedure and the sedation                            options and risks were discussed with the patient.                            All questions were answered, and informed consent                            was obtained. Prior Anticoagulants: The patient has                            taken no anticoagulant or antiplatelet agents. ASA                            Grade Assessment: II - A patient with mild systemic                            disease. After reviewing the risks and benefits,                            the patient was deemed in satisfactory condition to                            undergo the procedure.  After obtaining informed consent, the endoscope was                            passed under direct vision. Throughout the                            procedure, the patient's blood pressure, pulse, and                            oxygen saturations were monitored continuously. The                            Olympus Scope [id number] was introduced through the                            mouth, and advanced to the  second part of duodenum.                            The upper GI endoscopy was accomplished without                            difficulty. The patient tolerated the procedure                            well. Scope In: Scope Out: Findings:                 The Z-line was regular and was found 38 cm from the                            incisors.                           The gastroesophageal flap valve was visualized                            endoscopically and classified as Hill Grade III                            (minimal fold, loose to endoscope, hiatal hernia                            likely).                           A 2 cm hiatal hernia was present.                           The exam of the stomach was otherwise normal.                           The examined duodenum was normal. Complications:            No immediate complications. Estimated Blood Loss:     Estimated blood loss was minimal. Impression:               -  Z-line regular, 38 cm from the incisors.                           - Gastroesophageal flap valve classified as Hill                            Grade III (minimal fold, loose to endoscope, hiatal                            hernia likely).                           - 2 cm hiatal hernia.                           - Normal examined duodenum.                           - No specimens collected. Recommendation:           - Patient has a contact number available for                            emergencies. The signs and symptoms of potential                            delayed complications were discussed with the                            patient. Return to normal activities tomorrow.                            Written discharge instructions were provided to the                            patient.                           - Resume previous diet.                           - Continue present medications.                           - No repeat upper endoscopy for surveillance  of                            Barrett's esophagus due to today's normal exam.                           - Follow an antireflux regimen.                           - Use Prilosec (omeprazole) 40 mg PO daily. Napoleon Form, MD 03/09/2023 9:17:10 AM This report has been signed electronically.

## 2023-03-09 NOTE — Op Note (Signed)
[facility] Patient Name: Donna Manning Procedure Date: 03/09/2023 8:33 AM MRN: [id number] Endoscopist: Napoleon Form , MD, [id number] Age: 71 Referring MD:  Date of Birth: 08-Jul-1951 Gender: Female Account #: [id number] Procedure:                Colonoscopy Indications:              High risk colon cancer surveillance: Personal                            history of sessile serrated colon polyp (10 mm or                            greater in size) Medicines:                Monitored Anesthesia Care Procedure:                Pre-Anesthesia Assessment:                           - Prior to the procedure, a History and Physical                            was performed, and patient medications and                            allergies were reviewed. The patient's tolerance of                            previous anesthesia was also reviewed. The risks                            and benefits of the procedure and the sedation                            options and risks were discussed with the patient.                            All questions were answered, and informed consent                            was obtained. Prior Anticoagulants: The patient has                            taken no anticoagulant or antiplatelet agents. ASA                            Grade Assessment: II - A patient with mild systemic                            disease. After reviewing the risks and benefits,                            the patient was deemed in satisfactory condition to  undergo the procedure.                           After obtaining informed consent, the colonoscope                            was passed under direct vision. Throughout the                            procedure, the patient's blood pressure, pulse, and                            oxygen saturations were monitored continuously. The                            [id number]-[id number] Colonoscope [id number] was  introduced                            through the anus and advanced to the the cecum,                            identified by appendiceal orifice and ileocecal                            valve. The colonoscopy was performed without                            difficulty. The patient tolerated the procedure                            well. The quality of the bowel preparation was good. Scope In: 8:46:11 AM Scope Out: 9:04:30 AM Scope Withdrawal Time: 0 hours 10 minutes 49 seconds  Total Procedure Duration: 0 hours 18 minutes 19 seconds  Findings:                 The perianal and digital rectal examinations were                            normal.                           A 6 mm polyp was found in the transverse colon. The                            polyp was sessile. The polyp was removed with a                            cold snare. Resection and retrieval were complete.                           Scattered large-mouthed, medium-mouthed and                            small-mouthed diverticula were found in the sigmoid  colon, descending colon, transverse colon and                            ascending colon.                           Non-bleeding external and internal hemorrhoids were                            found during retroflexion. The hemorrhoids were                            medium-sized. Complications:            No immediate complications. Estimated Blood Loss:     Estimated blood loss was minimal. Impression:               - One 6 mm polyp in the transverse colon, removed                            with a cold snare. Resected and retrieved.                           - Diverticulosis in the sigmoid colon, in the                            descending colon, in the transverse colon and in                            the ascending colon.                           - Non-bleeding external and internal hemorrhoids. Recommendation:           - Patient has a  contact number available for                            emergencies. The signs and symptoms of potential                            delayed complications were discussed with the                            patient. Return to normal activities tomorrow.                            Written discharge instructions were provided to the                            patient.                           - Resume previous diet.                           - Continue present medications.                           -  Await pathology results.                           - Repeat colonoscopy in 5 years for surveillance                            based on pathology results. Napoleon Form, MD 03/09/2023 9:14:03 AM This report has been signed electronically.

## 2023-03-09 NOTE — Patient Instructions (Addendum)
YOU HAD AN ENDOSCOPIC PROCEDURE TODAY AT THE [facility]:   Refer to the procedure report that was given to you for any specific questions about what was found during the examination.  If the procedure report does not answer your questions, please call your gastroenterologist to clarify.  If you requested that your care partner not be given the details of your procedure findings, then the procedure report has been included in a sealed envelope for you to review at your convenience later.  YOU SHOULD EXPECT: Some feelings of bloating in the abdomen. Passage of more gas than usual.  Walking can help get rid of the air that was put into your GI tract during the procedure and reduce the bloating. If you had a lower endoscopy (such as a colonoscopy or flexible sigmoidoscopy) you may notice spotting of blood in your stool or on the toilet paper. If you underwent a bowel prep for your procedure, you may not have a normal bowel movement for a few days.  Please Note:  You might notice some irritation and congestion in your nose or some drainage.  This is from the oxygen used during your procedure.  There is no need for concern and it should clear up in a day or so.  SYMPTOMS TO REPORT IMMEDIATELY:  Following lower endoscopy (colonoscopy or flexible sigmoidoscopy):  Excessive amounts of blood in the stool  Significant tenderness or worsening of abdominal pains  Swelling of the abdomen that is new, acute  Fever of 100F or higher  Following upper endoscopy (EGD)  Vomiting of blood or coffee ground material  New chest pain or pain under the shoulder blades  Painful or persistently difficult swallowing  New shortness of breath  Fever of 100F or higher  Black, tarry-looking stools  For urgent or emergent issues, a gastroenterologist can be reached at any hour by calling [phone]. Do not use MyChart messaging for urgent concerns.    DIET:  We do recommend a small meal at first, but  then you may proceed to your regular diet.  Drink plenty of fluids but you should avoid alcoholic beverages for 24 hours.  MEDICATIONS: Continue present medications. Use Prilosec (omeprazole) 40 mg by mouth daily.  Please see handouts given to you by your recovery nurse: Polyps, Diverticulosis, Hemorrhoids, Hiatal Hernia.  USE ANTIREFLUX REGIMEN: See handout.  FOLLOW ZO:XWRUE pathology results. Repeat colonoscopy in 5 years for surveillance based on pathology results.  Thank you for allowing [place] to provide for your healthcare needs today.  ACTIVITY:  You should plan to take it easy for the rest of today and you should NOT DRIVE or use heavy machinery until tomorrow (because of the sedation medicines used during the test).    FOLLOW UP: Our staff will call the number listed on your records the next business day following your procedure.  We will call around 7:15- 8:00 am to check on you and address any questions or concerns that you may have regarding the information given to you following your procedure. If we do not reach you, we will leave a message.     If any biopsies were taken you will be contacted by phone or by letter within the next 1-3 weeks.  Please call us at [phone] if you have not heard about the biopsies in 3 weeks.    SIGNATURES/CONFIDENTIALITY: You and/or your care partner have signed paperwork which will be entered into your electronic medical record.  These signatures attest to the  fact that that the information above on your After Visit Summary has been reviewed and is understood.  Full responsibility of the confidentiality of this discharge information lies with you and/or your care-partner.

## 2023-03-10 ENCOUNTER — Telehealth: Payer: Self-pay

## 2023-03-10 NOTE — Telephone Encounter (Signed)
  Follow up Call-     03/09/2023    7:51 AM 06/24/2020    8:37 AM  Call back number  Post procedure Call Back phone  # [phone]  Permission to leave phone message Yes Yes     Left message

## 2023-03-24 ENCOUNTER — Encounter: Payer: Self-pay | Admitting: Gastroenterology

## 2023-04-04 DIAGNOSIS — Z23 Encounter for immunization: Secondary | ICD-10-CM | POA: Diagnosis not present

## 2023-04-10 NOTE — Progress Notes (Unsigned)
No chief complaint on file.   HPI: Donna Manning 72 y.o. come in for  ROS: See pertinent positives and negatives per HPI.  Past Medical History:  Diagnosis Date   Atypical lobular hyperplasia [facility]) of left breast 03/2018   Barrett's esophagus 2009   Benign head tremor    Breast cancer (HCC)    Breast cancer, right breast (HCC) 01/2014   "had 5 weeks radiation after lumpectomy" (04/24/2018)   Dental bridge present    lower left   Dental crowns present    GERD (gastroesophageal reflux disease)    History of angina    History of DVT (deep vein thrombosis) ~ 1980   after a pregnancy   History of radiation therapy 2015   Hyperlipidemia    Hypothyroidism    Irregular heart beat    "dx'd by 1 dr; not noted by the cardiologist" (04/24/2018)   NSAID-induced gastric ulcer    Osteoporosis    Personal history of radiation therapy    PONV (postoperative nausea and vomiting)     Family History  Problem Relation Age of Onset   Heart failure Mother 23   Diabetes Mother    Parkinsonism Father 57   Stroke Father    Thyroid disease Sister    Prostate cancer Brother    Stomach cancer Paternal Grandfather    Colon polyps Neg Hx    Crohn's disease Neg Hx    Esophageal cancer Neg Hx    Rectal cancer Neg Hx    Colon cancer Neg Hx     Social History   Socioeconomic History   Marital status: Married    Spouse name: Not on file   Number of children: 5   Years of education: Not on file   Highest education level: Not on file  Occupational History   Occupation: homemaker  Tobacco Use   Smoking status: Never   Smokeless tobacco: Never  Vaping Use   Vaping status: Never Used  Substance and Sexual Activity   Alcohol use: Not Currently    Comment: 04/24/2018 "drank a few beers in college"   Drug use: Never   Sexual activity: Not Currently  Other Topics Concern   Not on file  Social History Narrative   Not on file   Social Determinants of Health   Financial Resource  Strain: Low Risk  (05/03/2022)   Overall Financial Resource Strain (CARDIA)    Difficulty of Paying Living Expenses: Not hard at all  Food Insecurity: No Food Insecurity (05/03/2022)   Hunger Vital Sign    Worried About Running Out of Food in the Last Year: Never true    Ran Out of Food in the Last Year: Never true  Transportation Needs: No Transportation Needs (05/03/2022)   PRAPARE - Administrator, Civil Service (Medical): No    Lack of Transportation (Non-Medical): No  Physical Activity: Sufficiently Active (05/03/2022)   Exercise Vital Sign    Days of Exercise per Week: 5 days    Minutes of Exercise per Session: 30 min  Stress: No Stress Concern Present (05/03/2022)   [employer] of Occupational Health - Occupational Stress Questionnaire    Feeling of Stress : Not at all  Social Connections: Unknown (11/30/2021)   Received from [facility]   Social Network    Social Network: Not on file    Outpatient Medications Prior to Visit  Medication Sig Dispense Refill   Calcium Carbonate-Vit D-Min (CALCIUM 1200 PO) Take by  mouth daily at 6 (six) AM.     cetirizine (ZYRTEC) 5 MG tablet Take by mouth as needed for allergies.     Cholecalciferol (VITAMIN D) 50 MCG (2000 UT) CAPS Take by mouth daily at 6 (six) AM.     levothyroxine (SYNTHROID) 88 MCG tablet Take 1 tablet (88 mcg total) by mouth daily. 90 tablet 0   Multiple Vitamins-Minerals (MULTIVITAMIN ADULTS 50+ PO) Take 1 tablet by mouth daily.     omeprazole (PRILOSEC) 40 MG capsule Take 1 capsule (40 mg total) by mouth 2 (two) times daily. 60 capsule 6   omeprazole (PRILOSEC) 40 MG capsule Take 1 capsule (40 mg total) by mouth daily. 90 capsule 3   rosuvastatin (CRESTOR) 10 MG tablet Take 1 tablet (10 mg total) by mouth daily. 90 tablet 3   tamoxifen (NOLVADEX) 20 MG tablet Take 1 tablet (20 mg total) by mouth daily. 90 tablet 3   Facility-Administered Medications Prior to Visit  Medication Dose  Route Frequency Provider Last Rate Last Admin   0.9 %  sodium chloride infusion  500 mL Intravenous Once Nandigam, Eleonore Chiquito, MD         EXAM:  There were no vitals taken for this visit.  There is no height or weight on file to calculate BMI.  GENERAL: vitals reviewed and listed above, alert, oriented, appears well hydrated and in no acute distress HEENT: atraumatic, conjunctiva  clear, no obvious abnormalities on inspection of external nose and ears OP : no lesion edema or exudate  NECK: no obvious masses on inspection palpation  LUNGS: clear to auscultation bilaterally, no wheezes, rales or rhonchi, good air movement CV: HRRR, no clubbing cyanosis or  peripheral edema nl cap refill  MS: moves all extremities without noticeable focal  abnormality PSYCH: pleasant and cooperative, no obvious depression or anxiety Lab Results  Component Value Date   WBC 5.8 11/04/2022   HGB 11.7 11/04/2022   HCT 34.5 11/04/2022   PLT 213 11/04/2022   GLUCOSE 100 (H) 07/21/2021   CHOL 122 12/01/2021   TRIG 113 12/01/2021   HDL 58 12/01/2021   LDLDIRECT 147.2 11/23/2011   LDLCALC 44 12/01/2021   ALT 18 07/21/2021   AST 23 07/21/2021   NA 139 07/21/2021   K 3.9 07/21/2021   CL 104 07/21/2021   CREATININE 0.81 07/21/2021   BUN 13 07/21/2021   CO2 27 07/21/2021   TSH 0.464 11/04/2022   HGBA1C 6.2 07/21/2021   BP Readings from Last 3 Encounters:  03/09/23 (!) 116/53  01/10/23 (!) 126/58  11/04/22 120/70    ASSESSMENT AND PLAN:  Discussed the following assessment and plan:  No diagnosis found.  -Patient advised to return or notify health care team  if  new concerns arise.  There are no Patient Instructions on file for this visit.   Neta Mends. Jacilyn Sanpedro M.D.

## 2023-04-11 ENCOUNTER — Ambulatory Visit: Payer: Medicare Other | Admitting: Internal Medicine

## 2023-04-11 ENCOUNTER — Encounter: Payer: Self-pay | Admitting: Internal Medicine

## 2023-04-11 VITALS — BP 104/60 | HR 83 | Temp 98.3°F | Ht 67.0 in | Wt 171.6 lb

## 2023-04-11 DIAGNOSIS — C50411 Malignant neoplasm of upper-outer quadrant of right female breast: Secondary | ICD-10-CM | POA: Diagnosis not present

## 2023-04-11 DIAGNOSIS — E039 Hypothyroidism, unspecified: Secondary | ICD-10-CM | POA: Diagnosis not present

## 2023-04-11 DIAGNOSIS — G8929 Other chronic pain: Secondary | ICD-10-CM

## 2023-04-11 DIAGNOSIS — R739 Hyperglycemia, unspecified: Secondary | ICD-10-CM | POA: Diagnosis not present

## 2023-04-11 DIAGNOSIS — R5383 Other fatigue: Secondary | ICD-10-CM | POA: Diagnosis not present

## 2023-04-11 DIAGNOSIS — E785 Hyperlipidemia, unspecified: Secondary | ICD-10-CM | POA: Diagnosis not present

## 2023-04-11 DIAGNOSIS — M79674 Pain in right toe(s): Secondary | ICD-10-CM

## 2023-04-11 DIAGNOSIS — Z170 Estrogen receptor positive status [ER+]: Secondary | ICD-10-CM

## 2023-04-11 LAB — BASIC METABOLIC PANEL
BUN: 11 mg/dL (ref 6–23)
CO2: 28 mEq/L (ref 19–32)
Calcium: 9 mg/dL (ref 8.4–10.5)
Chloride: 107 mEq/L (ref 96–112)
Creatinine, Ser: 0.77 mg/dL (ref 0.40–1.20)
GFR: 76.47 mL/min (ref >60.00)
Glucose, Bld: 98 mg/dL (ref 70–99)
Potassium: 4.1 mEq/L (ref 3.5–5.1)
Sodium: 142 mEq/L (ref 135–145)

## 2023-04-11 LAB — CBC WITH DIFFERENTIAL/PLATELET
Basophils Absolute: 0.1 10*3/uL (ref 0.0–0.1)
Basophils Relative: 1.3 % (ref 0.0–3.0)
Eosinophils Absolute: 0.3 10*3/uL (ref 0.0–0.7)
Eosinophils Relative: 5.9 % — ABNORMAL HIGH (ref 0.0–5.0)
HCT: 36.6 % (ref 36.0–46.0)
Hemoglobin: 11.9 g/dL — ABNORMAL LOW (ref 12.0–15.0)
Lymphocytes Relative: 26 % (ref 12.0–46.0)
Lymphs Abs: 1.2 10*3/uL (ref 0.7–4.0)
MCHC: 32.6 g/dL (ref 30.0–36.0)
MCV: 93.7 fl (ref 78.0–100.0)
Monocytes Absolute: 0.3 10*3/uL (ref 0.1–1.0)
Monocytes Relative: 7.1 % (ref 3.0–12.0)
Neutro Abs: 2.7 10*3/uL (ref 1.4–7.7)
Neutrophils Relative %: 59.7 % (ref 43.0–77.0)
Platelets: 199 10*3/uL (ref 150.0–400.0)
RBC: 3.91 Mil/uL (ref 3.87–5.11)
RDW: 12.8 % (ref 11.5–15.5)
WBC: 4.5 10*3/uL (ref 4.0–10.5)

## 2023-04-11 LAB — LIPID PANEL
Cholesterol: 117 mg/dL (ref 0–200)
HDL: 52.7 mg/dL (ref >39.00)
LDL Cholesterol: 44 mg/dL (ref 0–99)
NonHDL: 63.88
Total CHOL/HDL Ratio: 2
Triglycerides: 101 mg/dL (ref 0.0–149.0)
VLDL: 20.2 mg/dL (ref 0.0–40.0)

## 2023-04-11 LAB — HEPATIC FUNCTION PANEL
ALT: 20 U/L (ref 0–35)
AST: 31 U/L (ref 0–37)
Albumin: 3.9 g/dL (ref 3.5–5.2)
Alkaline Phosphatase: 39 U/L (ref 39–117)
Bilirubin, Direct: 0.1 mg/dL (ref 0.0–0.3)
Total Bilirubin: 0.5 mg/dL (ref 0.2–1.2)
Total Protein: 6.3 g/dL (ref 6.0–8.3)

## 2023-04-11 LAB — MAGNESIUM: Magnesium: 1.9 mg/dL (ref 1.5–2.5)

## 2023-04-11 LAB — TSH: TSH: 0.57 u[IU]/mL (ref 0.35–5.50)

## 2023-04-11 LAB — HEMOGLOBIN A1C: Hgb A1c MFr Bld: 6.2 % (ref 4.6–6.5)

## 2023-04-11 NOTE — Patient Instructions (Addendum)
Lab today  r/o metabolic . Then weill plan on referral  to foot ankle ortho team  Sounds like a local phenomenon.  Good padded shoe wear.

## 2023-04-14 NOTE — Progress Notes (Signed)
No diabetes. Results stable  ldl at goal  44  thyroid normal . Borderline anemia that you have had before .   Not alarming but would  follow .   Suggest cbcdiff and ferritin ibc , b12 level in about 4 months to follow and make sure stable  ( dx borderline anemia)

## 2023-04-30 ENCOUNTER — Other Ambulatory Visit: Payer: Self-pay | Admitting: Internal Medicine

## 2023-05-09 ENCOUNTER — Ambulatory Visit: Payer: Medicare Other | Admitting: Adult Health

## 2023-05-09 ENCOUNTER — Encounter: Payer: Self-pay | Admitting: Adult Health

## 2023-05-09 VITALS — BP 102/70 | HR 85 | Temp 98.4°F | Ht 67.0 in | Wt 176.0 lb

## 2023-05-09 DIAGNOSIS — R300 Dysuria: Secondary | ICD-10-CM | POA: Diagnosis not present

## 2023-05-09 LAB — POCT URINALYSIS DIPSTICK
Bilirubin, UA: POSITIVE
Blood, UA: POSITIVE
Glucose, UA: NEGATIVE
Ketones, UA: NEGATIVE
Nitrite, UA: NEGATIVE
Protein, UA: POSITIVE — AB
Spec Grav, UA: 1.025 (ref 1.010–1.025)
Urobilinogen, UA: 0.2 U/dL
pH, UA: 5 (ref 5.0–8.0)

## 2023-05-09 MED ORDER — NITROFURANTOIN MONOHYD MACRO 100 MG PO CAPS
100.0000 mg | ORAL_CAPSULE | Freq: Two times a day (BID) | ORAL | 0 refills | Status: DC
Start: 2023-05-09 — End: 2024-04-16

## 2023-05-09 NOTE — Progress Notes (Signed)
Subjective:    Patient ID: Donna Manning, female    DOB: 1951/02/21, 72 y.o.   MRN: [id number]  Urinary Tract Infection  This is a new problem. The current episode started in the past 7 days (2 days ago). The problem has been gradually worsening. Associated symptoms include frequency, hesitancy and urgency. Pertinent negatives include no chills, discharge, flank pain or vomiting. She has tried nothing for the symptoms. There is no history of recurrent UTIs.      Review of Systems  Constitutional:  Negative for chills.  Gastrointestinal:  Negative for vomiting.  Genitourinary:  Positive for difficulty urinating, dysuria, frequency, hesitancy and urgency. Negative for flank pain, pelvic pain and vaginal discharge.   Past Medical History:  Diagnosis Date   Atypical lobular hyperplasia [facility]) of left breast 03/2018   Barrett's esophagus 2009   Benign head tremor    Breast cancer (HCC)    Breast cancer, right breast (HCC) 01/2014   "had 5 weeks radiation after lumpectomy" (04/24/2018)   Dental bridge present    lower left   Dental crowns present    GERD (gastroesophageal reflux disease)    History of angina    History of DVT (deep vein thrombosis) ~ 1980   after a pregnancy   History of radiation therapy 2015   Hyperlipidemia    Hypothyroidism    Irregular heart beat    "dx'd by 1 dr; not noted by the cardiologist" (04/24/2018)   NSAID-induced gastric ulcer    Osteoporosis    Personal history of radiation therapy    PONV (postoperative nausea and vomiting)     Social History   Socioeconomic History   Marital status: Married    Spouse name: Not on file   Number of children: 5   Years of education: Not on file   Highest education level: Not on file  Occupational History   Occupation: homemaker  Tobacco Use   Smoking status: Never   Smokeless tobacco: Never  Vaping Use   Vaping status: Never Used  Substance and Sexual Activity   Alcohol use: Not Currently     Comment: 04/24/2018 "drank a few beers in college"   Drug use: Never   Sexual activity: Not Currently  Other Topics Concern   Not on file  Social History Narrative   Not on file   Social Determinants of Health   Financial Resource Strain: Low Risk  (05/03/2022)   Overall Financial Resource Strain (CARDIA)    Difficulty of Paying Living Expenses: Not hard at all  Food Insecurity: No Food Insecurity (05/03/2022)   Hunger Vital Sign    Worried About Running Out of Food in the Last Year: Never true    Ran Out of Food in the Last Year: Never true  Transportation Needs: No Transportation Needs (05/03/2022)   PRAPARE - Administrator, Civil Service (Medical): No    Lack of Transportation (Non-Medical): No  Physical Activity: Sufficiently Active (05/03/2022)   Exercise Vital Sign    Days of Exercise per Week: 5 days    Minutes of Exercise per Session: 30 min  Stress: No Stress Concern Present (05/03/2022)   [employer] of Occupational Health - Occupational Stress Questionnaire    Feeling of Stress : Not at all  Social Connections: Unknown (11/30/2021)   Received [facility], [facility]   Social Network    Social Network: Not on file  Intimate Partner Violence: Unknown (10/22/2021)   Received from [facility],  [facility]   HITS    Physically Hurt: Not on file    Insult or Talk Down To: Not on file    Threaten Physical Harm: Not on file    Scream or Curse: Not on file    Past Surgical History:  Procedure Laterality Date   BREAST BIOPSY Right 1991   BREAST EXCISIONAL BIOPSY Left 2019   BREAST LUMPECTOMY Right 1991   BREAST LUMPECTOMY W/ NEEDLE LOCALIZATION Left 04/24/2018   BREAST LUMPECTOMY WITH NEEDLE LOCALIZATION Left 04/24/2018   Procedure: LEFT BREAST LUMPECTOMY WITH NEEDLE LOCALIZATION;  Surgeon: Harriette Bouillon, MD;  Location: [facility];  Service: General;  Laterality: Left;   COLONOSCOPY     "w/hemorrhoid banding"   COLOSTOMY   2009   PARTIAL MASTECTOMY WITH NEEDLE LOCALIZATION AND AXILLARY SENTINEL LYMPH NODE BX Right 01/20/2014   Procedure: RIGHT PARTIAL MASTECTOMY WITH DOUBLE  NEEDLE LOCALIZATION (BRACKETED )AND AXILLARY SENTINEL LYMPH NODE BX;  Surgeon: Ernestene Mention, MD;  Location: [facility];  Service: General;  Laterality: Right;  And Axilla.   RE-EXCISION OF BREAST CANCER,SUPERIOR MARGINS Right 02/05/2014   Procedure: RIGHT LUMPECTOMY, RE-EXCISION OF BREAST CANCER,SUPERIOR MARGINS;  Surgeon: Ernestene Mention, MD;  Location:  SURGERY CENTER;  Service: General;  Laterality: Right;   UPPER GASTROINTESTINAL ENDOSCOPY  2016   UPPER GI ENDOSCOPY  12/25/2014   with Propofol    Family History  Problem Relation Age of Onset   Heart failure Mother 76   Diabetes Mother    Parkinsonism Father 15   Stroke Father    Thyroid disease Sister    Prostate cancer Brother    Stomach cancer Paternal Grandfather    Colon polyps Neg Hx    Crohn's disease Neg Hx    Esophageal cancer Neg Hx    Rectal cancer Neg Hx    Colon cancer Neg Hx     Allergies  Allergen Reactions   Contrast Media [Iodinated Contrast Media] Hives and Shortness Of Breath   Actonel [Risedronate Sodium] Other (See Comments)    HIP PAIN   Starch Rash    IN LATEX GLOVES    Current Outpatient Medications on File Prior to Visit  Medication Sig Dispense Refill   Calcium Carbonate-Vit D-Min (CALCIUM 1200 PO) Take by mouth daily at 6 (six) AM.     cetirizine (ZYRTEC) 5 MG tablet Take by mouth as needed for allergies.     Cholecalciferol (VITAMIN D) 50 MCG (2000 UT) CAPS Take by mouth daily at 6 (six) AM.     levothyroxine (SYNTHROID) 88 MCG tablet TAKE 1 TABLET BY MOUTH EVERY DAY 90 tablet 0   Multiple Vitamins-Minerals (MULTIVITAMIN ADULTS 50+ PO) Take 1 tablet by mouth daily.     omeprazole (PRILOSEC) 40 MG capsule Take 1 capsule (40 mg total) by mouth daily. 90 capsule 3   rosuvastatin (CRESTOR) 10 MG tablet Take 1 tablet  (10 mg total) by mouth daily. 90 tablet 3   tamoxifen (NOLVADEX) 20 MG tablet Take 1 tablet (20 mg total) by mouth daily. 90 tablet 3   Current Facility-Administered Medications on File Prior to Visit  Medication Dose Route Frequency Provider Last Rate Last Admin   0.9 %  sodium chloride infusion  500 mL Intravenous Once Nandigam, Kavitha V, MD        BP 102/70   Pulse 85   Temp 98.4 F (36.9 C) (Oral)   Ht 5\' 7"  (1.702 m)   Wt 176 lb (79.8 kg)  SpO2 97%   BMI 27.57 kg/m       Objective:   Physical Exam Vitals and nursing note reviewed.  Constitutional:      Appearance: Normal appearance.  Pulmonary:     Effort: Pulmonary effort is normal.     Breath sounds: Normal breath sounds.  Abdominal:     General: Abdomen is flat. Bowel sounds are normal. There is no distension.     Palpations: Abdomen is soft.     Tenderness: There is no right CVA tenderness or left CVA tenderness.  Musculoskeletal:        General: Normal range of motion.  Skin:    General: Skin is warm and dry.  Neurological:     General: No focal deficit present.     Mental Status: She is alert and oriented to person, place, and time.  Psychiatric:        Mood and Affect: Mood normal.        Behavior: Behavior normal.        Thought Content: Thought content normal.        Judgment: Judgment normal.       Assessment & Plan:  1. Dysuria - POC Urinalysis Dipstick+ leuks, blood, protein - Will treat with Macrobid and await culture results  - Culture, Urine; Future - nitrofurantoin, macrocrystal-monohydrate, (MACROBID) 100 MG capsule; Take 1 capsule (100 mg total) by mouth 2 (two) times daily.  Dispense: 10 capsule; Refill: 0  Shirline Frees, NP

## 2023-05-31 ENCOUNTER — Other Ambulatory Visit: Payer: Self-pay | Admitting: Hematology and Oncology

## 2023-05-31 DIAGNOSIS — Z1231 Encounter for screening mammogram for malignant neoplasm of breast: Secondary | ICD-10-CM

## 2023-06-05 ENCOUNTER — Telehealth: Payer: Medicare Other | Admitting: Hematology and Oncology

## 2023-06-08 ENCOUNTER — Encounter: Payer: Self-pay | Admitting: Family Medicine

## 2023-06-08 ENCOUNTER — Ambulatory Visit: Payer: Medicare Other | Admitting: Family Medicine

## 2023-06-08 DIAGNOSIS — Z0000 Encounter for general adult medical examination without abnormal findings: Secondary | ICD-10-CM | POA: Diagnosis not present

## 2023-06-08 NOTE — Progress Notes (Signed)
PATIENT CHECK-IN and HEALTH RISK ASSESSMENT QUESTIONNAIRE:  -completed by phone/video for upcoming Medicare Preventive Visit  Pre-Visit Check-in: 1)Vitals (height, wt, BP, etc) - record in vitals section for visit on day of visit Request home vitals (wt, BP, etc.) and enter into vitals, THEN update Vital Signs SmartPhrase below at the top of the HPI. See below.  2)Review and Update Medications, Allergies PMH, Surgeries, Social history in Epic 3)Hospitalizations in the last year with date/reason? No  4)Review and Update Care Team (patient's specialists) in Epic 5) Complete PHQ9 in Epic  6) Complete Fall Screening in Epic 7)Review all Health Maintenance Due and order under PCP if not done.  8)Medicare Wellness Questionnaire: Answer theses question about your habits: Do you drink alcohol? No If yes, how many drinks do you have a day?N/A Have you ever smoked?No  Quit date if applicable? N/A  How many packs a day do/did you smoke? N/A Do you use smokeless tobacco?No Do you use an illicit drugs? No Do you exercises? No - but does take care of grandkids and does yard work, does most days throughout the year - sometimes not as much in winter Are you sexually active?  No Number of partners? Typical breakfast: Eggs  Typical lunch: Varies  Typical dinner: Varies  Typical snacks: Chocolate   Beverages: Coke  Answer theses question about you: Can you perform most household chores?Yes Do you find it hard to follow a conversation in a noisy room?Occasionally  Do you often ask people to speak up or repeat themselves?No Do you feel that you have a problem with memory? No Do you balance your checkbook and or bank acounts?Yes Do you feel safe at home?Yes Last dentist visit? 01/24 Do you need assistance with any of the following: Please note if so No  Driving?  Feeding yourself?  Getting from bed to chair?  Getting to the toilet?  Bathing or showering?  Dressing yourself?  Managing  money?  Climbing a flight of stairs  Preparing meals?  Do you have Advanced Directives in place (Living Will, Healthcare Power or Attorney)? Yes   Last eye Exam and location? [facility] ophthalmology    Do you currently use prescribed or non-prescribed narcotic or opioid pain medications? No  Do you have a history or close family history of breast, ovarian, tubal or peritoneal cancer or a family member with BRCA (breast cancer susceptibility 1 and 2) gene mutations? Yes   Request home vitals (wt, BP, etc.) and enter into vitals, THEN update Vital Signs SmartPhrase below at the top of the HPI. See below.   PLEASE check "Not in Person" for "method of visit" if video or phone visit.      ----------------------------------------------------------------------------------------------------------------------------------------------------------------------------------------------------------------------  Because this visit was a virtual/telehealth visit, some criteria may be missing or patient reported. Any vitals not documented were not able to be obtained and vitals that have been documented are patient reported.    MEDICARE ANNUAL PREVENTIVE VISIT WITH PROVIDER: (Welcome to Medicare, initial annual wellness or annual wellness exam)  Virtual Visit via Phone Note  I connected with Donna Manning on 06/08/23 by phone  and verified that I am speaking with the correct person using two identifiers.  Location patient: home Location provider:work or home office Persons participating in the virtual visit: patient, provider  Concerns and/or follow up today: nothing new   See HM section in Epic for other details of completed HM.    ROS: negative for report of fevers, unintentional weight loss, vision changes,  vision loss, hearing loss or change, chest pain, sob, hemoptysis, melena, hematochezia, hematuria, falls, bleeding or bruising, thoughts of suicide or self harm, memory  loss  Patient-completed extensive health risk assessment - reviewed and discussed with the patient: See Health Risk Assessment completed with patient prior to the visit either above or in recent phone note. This was reviewed in detailed with the patient today and appropriate recommendations, orders and referrals were placed as needed per Summary below and patient instructions.   Review of Medical History: -PMH, PSH, Family History and current specialty and care providers reviewed and updated and listed below   Patient Care Team: [name], Neta Mends, MD as PCP - General Pricilla Riffle, MD as PCP - Cardiology (Cardiology) Noland Fordyce, MD as Attending Physician (Obstetrics and Gynecology) Elise Benne, MD (Ophthalmology) Claud Kelp, MD as Consulting Physician (General Surgery) Lurline Hare, MD as Consulting Physician (Radiation Oncology) Napoleon Form, MD as Consulting Physician (Gastroenterology) Serena Croissant, MD as Consulting Physician (Hematology and Oncology) Verner Chol, [facility] (Inactive) as Pharmacist (Pharmacist)   Past Medical History:  Diagnosis Date   Atypical lobular hyperplasia [facility]) of left breast 03/2018   Barrett's esophagus 2009   Benign head tremor    Breast cancer (HCC)    Breast cancer, right breast (HCC) 01/2014   "had 5 weeks radiation after lumpectomy" (04/24/2018)   Dental bridge present    lower left   Dental crowns present    GERD (gastroesophageal reflux disease)    History of angina    History of DVT (deep vein thrombosis) ~ 1980   after a pregnancy   History of radiation therapy 2015   Hyperlipidemia    Hypothyroidism    Irregular heart beat    "dx'd by 1 dr; not noted by the cardiologist" (04/24/2018)   NSAID-induced gastric ulcer    Osteoporosis    Personal history of radiation therapy    PONV (postoperative nausea and vomiting)     Past Surgical History:  Procedure Laterality Date   BREAST BIOPSY Right 1991   BREAST  EXCISIONAL BIOPSY Left 2019   BREAST LUMPECTOMY Right 1991   BREAST LUMPECTOMY W/ NEEDLE LOCALIZATION Left 04/24/2018   BREAST LUMPECTOMY WITH NEEDLE LOCALIZATION Left 04/24/2018   Procedure: LEFT BREAST LUMPECTOMY WITH NEEDLE LOCALIZATION;  Surgeon: Harriette Bouillon, MD;  Location: [facility];  Service: General;  Laterality: Left;   COLONOSCOPY     "w/hemorrhoid banding"   COLOSTOMY  2009   PARTIAL MASTECTOMY WITH NEEDLE LOCALIZATION AND AXILLARY SENTINEL LYMPH NODE BX Right 01/20/2014   Procedure: RIGHT PARTIAL MASTECTOMY WITH DOUBLE  NEEDLE LOCALIZATION (BRACKETED )AND AXILLARY SENTINEL LYMPH NODE BX;  Surgeon: Ernestene Mention, MD;  Location: [facility];  Service: General;  Laterality: Right;  And Axilla.   RE-EXCISION OF BREAST CANCER,SUPERIOR MARGINS Right 02/05/2014   Procedure: RIGHT LUMPECTOMY, RE-EXCISION OF BREAST CANCER,SUPERIOR MARGINS;  Surgeon: Ernestene Mention, MD;  Location:  SURGERY CENTER;  Service: General;  Laterality: Right;   UPPER GASTROINTESTINAL ENDOSCOPY  2016   UPPER GI ENDOSCOPY  12/25/2014   with Propofol    Social History   Socioeconomic History   Marital status: Married    Spouse name: Not on file   Number of children: 5   Years of education: Not on file   Highest education level: Not on file  Occupational History   Occupation: homemaker  Tobacco Use   Smoking status: Never   Smokeless tobacco: Never  Vaping Use  Vaping status: Never Used  Substance and Sexual Activity   Alcohol use: Not Currently    Comment: 04/24/2018 "drank a few beers in college"   Drug use: Never   Sexual activity: Not Currently  Other Topics Concern   Not on file  Social History Narrative   Not on file   Social Determinants of Health   Financial Resource Strain: Low Risk  (06/08/2023)   Overall Financial Resource Strain (CARDIA)    Difficulty of Paying Living Expenses: Not hard at all  Food Insecurity: No Food Insecurity  (06/08/2023)   Hunger Vital Sign    Worried About Running Out of Food in the Last Year: Never true    Ran Out of Food in the Last Year: Never true  Transportation Needs: No Transportation Needs (06/08/2023)   PRAPARE - Administrator, Civil Service (Medical): No    Lack of Transportation (Non-Medical): No  Physical Activity: Inactive (06/08/2023)   Exercise Vital Sign    Days of Exercise per Week: 0 days    Minutes of Exercise per Session: 0 min  Stress: No Stress Concern Present (06/08/2023)   [employer] of Occupational Health - Occupational Stress Questionnaire    Feeling of Stress : Not at all  Social Connections: Moderately Integrated (06/08/2023)   Social Connection and Isolation Panel [NHANES]    Frequency of Communication with Friends and Family: More than three times a week    Frequency of Social Gatherings with Friends and Family: Three times a week    Attends Religious Services: More than 4 times per year    Active Member of Clubs or Organizations: No    Attends Banker Meetings: Never    Marital Status: Married  Catering manager Violence: Not At Risk (06/08/2023)   Humiliation, Afraid, Rape, and Kick questionnaire    Fear of Current or Ex-Partner: No    Emotionally Abused: No    Physically Abused: No    Sexually Abused: No    Family History  Problem Relation Age of Onset   Heart failure Mother 35   Diabetes Mother    Parkinsonism Father 71   Stroke Father    Thyroid disease Sister    Prostate cancer Brother    Stomach cancer Paternal Grandfather    Colon polyps Neg Hx    Crohn's disease Neg Hx    Esophageal cancer Neg Hx    Rectal cancer Neg Hx    Colon cancer Neg Hx     Current Outpatient Medications on File Prior to Visit  Medication Sig Dispense Refill   Calcium Carbonate-Vit D-Min (CALCIUM 1200 PO) Take by mouth daily at 6 (six) AM.     cetirizine (ZYRTEC) 5 MG tablet Take by mouth as needed for allergies.      Cholecalciferol (VITAMIN D) 50 MCG (2000 UT) CAPS Take by mouth daily at 6 (six) AM.     levothyroxine (SYNTHROID) 88 MCG tablet TAKE 1 TABLET BY MOUTH EVERY DAY 90 tablet 0   Multiple Vitamins-Minerals (MULTIVITAMIN ADULTS 50+ PO) Take 1 tablet by mouth daily.     nitrofurantoin, macrocrystal-monohydrate, (MACROBID) 100 MG capsule Take 1 capsule (100 mg total) by mouth 2 (two) times daily. 10 capsule 0   omeprazole (PRILOSEC) 40 MG capsule Take 1 capsule (40 mg total) by mouth daily. 90 capsule 3   rosuvastatin (CRESTOR) 10 MG tablet Take 1 tablet (10 mg total) by mouth daily. 90 tablet 3   tamoxifen (NOLVADEX) 20 MG tablet Take  1 tablet (20 mg total) by mouth daily. 90 tablet 3   Current Facility-Administered Medications on File Prior to Visit  Medication Dose Route Frequency Provider Last Rate Last Admin   0.9 %  sodium chloride infusion  500 mL Intravenous Once Nandigam, Eleonore Chiquito, MD        Allergies  Allergen Reactions   Contrast Media [Iodinated Contrast Media] Hives and Shortness Of Breath   Actonel [Risedronate Sodium] Other (See Comments)    HIP PAIN   Starch Rash    IN LATEX GLOVES       Physical Exam Vitals requested from patient and listed below if patient had equipment and was able to obtain at home for this virtual visit: There were no vitals filed for this visit. Estimated body mass index is 27.57 kg/m as calculated from the following:   Height as of 05/09/23: 5\' 7"  (1.702 m).   Weight as of 05/09/23: 176 lb (79.8 kg).  EKG (optional): deferred due to virtual visit  GENERAL: alert, oriented, no acute distress detected, full vision exam deferred due to pandemic and/or virtual encounter  PSYCH/NEURO: pleasant and cooperative, no obvious depression or anxiety, speech and thought processing grossly intact, Cognitive function grossly intact  Flowsheet Row Office Visit from 06/08/2023 in [facility]  PHQ-9 Total Score 0            06/08/2023    9:07 AM 04/11/2023   11:20 AM 11/04/2022    3:34 PM 05/03/2022   11:30 AM 09/15/2021   10:43 AM  Depression screen PHQ 2/9  Decreased Interest 0 0 0 0 0  Down, Depressed, Hopeless 0 0 0 0 0  PHQ - 2 Score 0 0 0 0 0  Altered sleeping 0 1   2  Tired, decreased energy 0 1   1  Change in appetite 0 0   0  Feeling bad or failure about yourself  0 0   0  Trouble concentrating 0 0   0  Moving slowly or fidgety/restless 0 0   0  Suicidal thoughts 0 0   0  PHQ-9 Score 0 2   3  Difficult doing work/chores Not difficult at all Not difficult at all          09/15/2021   10:43 AM 05/03/2022   11:30 AM 06/13/2022    3:00 PM 11/04/2022    3:34 PM 06/08/2023    9:08 AM  Fall Risk  Falls in the past year? 1 0  0 0  Was there an injury with Fall? 0 0  0 0  Fall Risk Category Calculator 2 0  0 0  Fall Risk Category (Retired) Moderate Low     (RETIRED) Patient Fall Risk Level Low fall risk Low fall risk Low fall risk    Patient at Risk for Falls Due to No Fall Risks No Fall Risks;Medication side effect  Other (Comment) No Fall Risks  Fall risk Follow up Falls evaluation completed Falls prevention discussed;Education provided;Falls evaluation completed  Falls evaluation completed Falls evaluation completed     SUMMARY AND PLAN:  Encounter for Medicare annual wellness exam   Discussed applicable health maintenance/preventive health measures and advised and referred or ordered per patient preferences: -discussed tetanus booster and advised per [facility] Health Maintenance  Topic Date Due   DTaP/Tdap/Td (2 - Td or Tdap) 11/29/2021   COVID-19 Vaccine (7 - 2023-24 season) 05/30/2023   Medicare Annual Wellness (AWV)  06/07/2024   MAMMOGRAM  06/08/2024   Colonoscopy  03/08/2028   Pneumonia Vaccine 97+ Years old  Completed   INFLUENZA VACCINE  Completed   DEXA SCAN  Completed   Hepatitis C Screening  Completed   Zoster Vaccines- Shingrix  Completed   HPV VACCINES  Aged [employer] and counseling on the following was provided based on the above review of health and a plan/checklist for the patient, along with additional information discussed, was provided for the patient in the patient instructions :  -Provided counseling and plan for difficulty hearing - she does not feel she needs eval at this point -Advised and counseled on a healthy lifestyle - including the importance of a healthy diet, regular physical activity, social connections and stress management. -Reviewed patient's current diet. Advised and counseled on a whole foods based healthy diet. A summary of a healthy diet was provided in the Patient Instructions.  -reviewed patient's current physical activity level and discussed exercise guidelines for adults. Discussed community resources and ideas for safe exercise at home to assist in meeting exercise guideline recommendations in a safe and healthy way.  -Advise yearly dental visits at minimum and regular eye exams  Follow up: see patient instructions     Patient Instructions  I really enjoyed getting to talk with you today! I am available on Tuesdays and Thursdays for virtual visits if you have any questions or concerns, or if I can be of any further assistance.   CHECKLIST FROM ANNUAL WELLNESS VISIT:  -Follow up (please call to schedule if not scheduled after visit):   -yearly for annual wellness visit with primary care office  Here is a list of your preventive care/health maintenance measures and the plan for each if any are due:  PLAN For any measures below that may be due:  -can get the tetanus booster at the pharmacy  Health Maintenance  Topic Date Due   DTaP/Tdap/Td (2 - Td or Tdap) 11/29/2021   COVID-19 Vaccine (7 - 2023-24 season) 05/30/2023   Medicare Annual Wellness (AWV)  06/07/2024   MAMMOGRAM  06/08/2024   Colonoscopy  03/08/2028   Pneumonia Vaccine 65+ Years old  Completed   INFLUENZA VACCINE  Completed   DEXA SCAN   Completed   Hepatitis C Screening  Completed   Zoster Vaccines- Shingrix  Completed   HPV VACCINES  Aged Out    -See a dentist at least yearly  -Get your eyes checked and then per your eye specialist's recommendations  -Other issues addressed today:   -I have included below further information regarding a healthy whole foods based diet, physical activity guidelines for adults, stress management and opportunities for social connections. I hope you find this information useful.   -----------------------------------------------------------------------------------------------------------------------------------------------------------------------------------------------------------------------------------------------------------  NUTRITION: -eat real food: lots of colorful vegetables (half the plate) and fruits -5-7 servings of vegetables and fruits per day (fresh or steamed is best), exp. 2 servings of vegetables with lunch and dinner and 2 servings of fruit per day. Berries and greens such as kale and collards are great choices.  -consume on a regular basis: whole grains (make sure first ingredient on label contains the word "whole"), fresh fruits, fish, nuts, seeds, healthy oils (such as olive oil, avocado oil, grape seed oil) -may eat small amounts of dairy and lean meat on occasion, but avoid processed meats such as ham, bacon, lunch meat, etc. -drink water -try to avoid fast food and pre-packaged foods, processed meat -most experts advise limiting sodium to < 2300mg   per day, should limit further is any chronic conditions such as high blood pressure, heart disease, diabetes, etc. The American Heart Association advised that < 1500mg  is is ideal -try to avoid foods that contain any ingredients with names you do not recognize  -try to avoid sugar/sweets (except for the natural sugar that occurs in fresh fruit) -try to avoid sweet drinks -try to avoid white rice, white bread, pasta (unless  whole grain), white or yellow potatoes  EXERCISE GUIDELINES FOR ADULTS: -if you wish to increase your physical activity, do so gradually and with the approval of your doctor -STOP and seek medical care immediately if you have any chest pain, chest discomfort or trouble breathing when starting or increasing exercise  -move and stretch your body, legs, feet and arms when sitting for long periods -Physical activity guidelines for optimal health in adults: -least 150 minutes per week of aerobic exercise (can talk, but not sing) once approved by your doctor, 20-30 minutes of sustained activity or two 10 minute episodes of sustained activity every day.  -resistance training at least 2 days per week if approved by your doctor -balance exercises 3+ days per week:   Stand somewhere where you have something sturdy to hold onto if you lose balance.    1) lift up on toes, start with 5x per day and work up to 20x   2) stand and lift on leg straight out to the side so that foot is a few inches of the floor, start with 5x each side and work up to 20x each side   3) stand on one foot, start with 5 seconds each side and work up to 20 seconds on each side  If you need ideas or help with getting more active:  -Silver sneakers https://tools.silversneakers.com  -Walk with a Doc: http://www.duncan-williams.com/  -try to include resistance (weight lifting/strength building) and balance exercises twice per week: or the following link for ideas: http://castillo-powell.com/  BuyDucts.dk  STRESS MANAGEMENT: -can try meditating, or just sitting quietly with deep breathing while intentionally relaxing all parts of your body for 5 minutes daily -if you need further help with stress, anxiety or depression please follow up with your primary doctor or contact the wonderful folks at [facility]: [phone]  SOCIAL  CONNECTIONS: -options in [place] if you wish to engage in more social and exercise related activities:  -Silver sneakers https://tools.silversneakers.com  -Walk with a Doc: http://www.duncan-williams.com/  -Check out the [facility] Active Adults 50+ section on the [place] of [employer] (hiking clubs, book clubs, cards and games, chess, exercise classes, aquatic classes and much more) - see the website for details: https://www.[place].gov/departments/parks-recreation/active-adults50  -YouTube has lots of exercise videos for different ages and abilities as well  -[facility] (a variety of indoor and outdoor inperson activities for adults). 313-824-2629. [address].  -Virtual Online Classes (a variety of topics): see seniorplanet.org or call [phone]  -consider volunteering at a school, hospice center, church, senior center or elsewhere           Terressa Koyanagi, DO

## 2023-06-08 NOTE — Progress Notes (Signed)
 Patient unable to obtain vital signs due to telehealth visit

## 2023-06-08 NOTE — Patient Instructions (Signed)
I really enjoyed getting to talk with you today! I am available on Tuesdays and Thursdays for virtual visits if you have any questions or concerns, or if I can be of any further assistance.   CHECKLIST FROM ANNUAL WELLNESS VISIT:  -Follow up (please call to schedule if not scheduled after visit):   -yearly for annual wellness visit with primary care office  Here is a list of your preventive care/health maintenance measures and the plan for each if any are due:  PLAN For any measures below that may be due:  -can get the tetanus booster at the pharmacy  Health Maintenance  Topic Date Due   DTaP/Tdap/Td (2 - Td or Tdap) 11/29/2021   COVID-19 Vaccine (7 - 2023-24 season) 05/30/2023   Medicare Annual Wellness (AWV)  06/07/2024   MAMMOGRAM  06/08/2024   Colonoscopy  03/08/2028   Pneumonia Vaccine 49+ Years old  Completed   INFLUENZA VACCINE  Completed   DEXA SCAN  Completed   Hepatitis C Screening  Completed   Zoster Vaccines- Shingrix  Completed   HPV VACCINES  Aged Out    -See a dentist at least yearly  -Get your eyes checked and then per your eye specialist's recommendations  -Other issues addressed today:   -I have included below further information regarding a healthy whole foods based diet, physical activity guidelines for adults, stress management and opportunities for social connections. I hope you find this information useful.   -----------------------------------------------------------------------------------------------------------------------------------------------------------------------------------------------------------------------------------------------------------  NUTRITION: -eat real food: lots of colorful vegetables (half the plate) and fruits -5-7 servings of vegetables and fruits per day (fresh or steamed is best), exp. 2 servings of vegetables with lunch and dinner and 2 servings of fruit per day. Berries and greens such as kale and collards are great  choices.  -consume on a regular basis: whole grains (make sure first ingredient on label contains the word "whole"), fresh fruits, fish, nuts, seeds, healthy oils (such as olive oil, avocado oil, grape seed oil) -may eat small amounts of dairy and lean meat on occasion, but avoid processed meats such as ham, bacon, lunch meat, etc. -drink water -try to avoid fast food and pre-packaged foods, processed meat -most experts advise limiting sodium to < 2300mg  per day, should limit further is any chronic conditions such as high blood pressure, heart disease, diabetes, etc. The American Heart Association advised that < 1500mg  is is ideal -try to avoid foods that contain any ingredients with names you do not recognize  -try to avoid sugar/sweets (except for the natural sugar that occurs in fresh fruit) -try to avoid sweet drinks -try to avoid white rice, white bread, pasta (unless whole grain), white or yellow potatoes  EXERCISE GUIDELINES FOR ADULTS: -if you wish to increase your physical activity, do so gradually and with the approval of your doctor -STOP and seek medical care immediately if you have any chest pain, chest discomfort or trouble breathing when starting or increasing exercise  -move and stretch your body, legs, feet and arms when sitting for long periods -Physical activity guidelines for optimal health in adults: -least 150 minutes per week of aerobic exercise (can talk, but not sing) once approved by your doctor, 20-30 minutes of sustained activity or two 10 minute episodes of sustained activity every day.  -resistance training at least 2 days per week if approved by your doctor -balance exercises 3+ days per week:   Stand somewhere where you have something sturdy to hold onto if you lose balance.  1) lift up on toes, start with 5x per day and work up to 20x   2) stand and lift on leg straight out to the side so that foot is a few inches of the floor, start with 5x each side and work  up to 20x each side   3) stand on one foot, start with 5 seconds each side and work up to 20 seconds on each side  If you need ideas or help with getting more active:  -Silver sneakers https://tools.silversneakers.com  -Walk with a Doc: http://www.duncan-williams.com/  -try to include resistance (weight lifting/strength building) and balance exercises twice per week: or the following link for ideas: http://castillo-powell.com/  BuyDucts.dk  STRESS MANAGEMENT: -can try meditating, or just sitting quietly with deep breathing while intentionally relaxing all parts of your body for 5 minutes daily -if you need further help with stress, anxiety or depression please follow up with your primary doctor or contact the wonderful folks at [facility]: [phone]  SOCIAL CONNECTIONS: -options in [place] if you wish to engage in more social and exercise related activities:  -Silver sneakers https://tools.silversneakers.com  -Walk with a Doc: http://www.duncan-williams.com/  -Check out the [facility] Active Adults 50+ section on the [place] of [employer] (hiking clubs, book clubs, cards and games, chess, exercise classes, aquatic classes and much more) - see the website for details: https://www.[place].gov/departments/parks-recreation/active-adults50  -YouTube has lots of exercise videos for different ages and abilities as well  -[facility] (a variety of indoor and outdoor inperson activities for adults). [phone]. [address].  -Virtual Online Classes (a variety of topics): see seniorplanet.org or call 7265281210  -consider volunteering at a school, hospice center, church, senior center or elsewhere

## 2023-06-13 DIAGNOSIS — H524 Presbyopia: Secondary | ICD-10-CM | POA: Diagnosis not present

## 2023-06-13 DIAGNOSIS — Z961 Presence of intraocular lens: Secondary | ICD-10-CM | POA: Diagnosis not present

## 2023-06-13 DIAGNOSIS — H04123 Dry eye syndrome of bilateral lacrimal glands: Secondary | ICD-10-CM | POA: Diagnosis not present

## 2023-06-21 ENCOUNTER — Other Ambulatory Visit: Payer: Self-pay | Admitting: Hematology and Oncology

## 2023-06-21 ENCOUNTER — Other Ambulatory Visit: Payer: Self-pay | Admitting: Internal Medicine

## 2023-07-03 ENCOUNTER — Ambulatory Visit
Admission: RE | Admit: 2023-07-03 | Discharge: 2023-07-03 | Disposition: A | Discharge status: Home / Self Care | Payer: Medicare Other | Source: Ambulatory Visit | Attending: Hematology and Oncology | Admitting: Hematology and Oncology

## 2023-07-03 DIAGNOSIS — Z1231 Encounter for screening mammogram for malignant neoplasm of breast: Secondary | ICD-10-CM

## 2023-07-07 ENCOUNTER — Inpatient Hospital Stay: Payer: Medicare Other | Attending: Hematology and Oncology | Admitting: Hematology and Oncology

## 2023-07-07 DIAGNOSIS — C50411 Malignant neoplasm of upper-outer quadrant of right female breast: Secondary | ICD-10-CM | POA: Diagnosis not present

## 2023-07-07 DIAGNOSIS — Z170 Estrogen receptor positive status [ER+]: Secondary | ICD-10-CM

## 2023-07-07 MED ORDER — TAMOXIFEN CITRATE 20 MG PO TABS: 20.0000 mg | ORAL_TABLET | Freq: Every day | ORAL | 3 refills | Status: AC

## 2023-07-07 NOTE — Progress Notes (Signed)
HEMATOLOGY-ONCOLOGY TELEPHONE VISIT PROGRESS NOTE  I connected with our patient on 07/07/23 at  8:00 AM EST by telephone and verified that I am speaking with the correct person using two identifiers.  I discussed the limitations, risks, security and privacy concerns of performing an evaluation and management service by telephone and the availability of in person appointments.  I also discussed with the patient that there may be a patient responsible charge related to this service. The patient expressed understanding and agreed to proceed.   History of Present Illness: Follow-up on tamoxifen  History of Present Illness   The patient, with a history of breast cancer and osteoporosis, reports new onset leg cramps. She has been taking tamoxifen since 2016 for breast cancer and recently started a high dose calcium supplement (1200mg ) for osteoporosis. She also takes a multivitamin and 2000 IU of vitamin D3 daily. The leg cramps, especially at night, have become quite an issue since starting the calcium supplement. She denies any other new symptoms or issues.  In addition, the patient's husband has a rapidly declining memory, now estimated at ten seconds. Despite this, the patient reports that her husband remains in good humor and is mostly pleasant.        Oncology History  Breast cancer of upper-outer quadrant of right female breast (HCC)  12/24/2013 Initial Diagnosis   Breast cancer of upper-outer quadrant of right female breast: Initial biopsy showed DCIS with calcifications and atypical ductal hyperplasia with suspicion of stromal invasion ER 100% PR 100%   01/20/2014 Surgery   Right breast lumpectomy, IDC grade 1; 3.9 cm and 0.25 cm with DCIS int grade with ALH 3 SLN negative superior margin positive, ER 100% PR 100% HER-2 negative ratio 1.1 Ki-67 3% Oncotype 14 low risk    02/05/2014 Surgery   Reexcision of the superior margin no residual cancer   04/17/2014 - 05/16/2014 Radiation Therapy    Adjuvant radiation therapy   07/25/2014 -  Anti-estrogen oral therapy   Anastrozole 1 mg daily switched to tamoxifen 20 mg daily from 09/23/2014 due to osteoporosis   04/24/2018 Surgery   Mammogram on 03/27/18 showed a distortion in the left breast. Biopsy on 03/29/19 showed atypical lobular hyperplasia. Lumpectomy on 04/24/18 with Dr. Luisa Hart.     REVIEW OF SYSTEMS:   Constitutional: Denies fevers, chills or abnormal weight loss All other systems were reviewed with the patient and are negative. 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 Endometrial thickening: She no longer has a white discharge.   Osteoporosis: DEXA scan November 2015 showed a T score of -2.6 (because of which we switched treatment to tamoxifen): Prolia was offered but patient refused. Bone density 12/16/2020: T score -2.5: Osteoporosis (given previous adverse effects to bisphosphonates, patient wants to watch and monitor)--T score of spine improved from -2.6 to -2.2.   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 The patient was provided an opportunity to ask questions and all were answered. The patient agreed with the plan and demonstrated an understanding of the instructions. The patient was advised to call back or seek an in-person evaluation if the symptoms worsen or if the condition fails to improve as anticipated.   I provided 12 minutes of non-face-to-face time during this encounter.  This includes time for charting and coordination of care   Tamsen Meek, MD

## 2023-07-07 NOTE — Assessment & Plan Note (Addendum)
Right breast invasive ductal carcinoma T2, N0, M0 stage IIA 0.9 cm and a separate nodule 0.25 cm ER/PR positive HER-2 negative, Oncotype DX recurrence score is 14 (9% risk of distant recurrence over 10 years with tamoxifen alone) status post radiation therapy, started antiestrogen therapy with anastrozole in January 2016 years switched to tamoxifen 09/23/2014 (due to osteoporosis), plan to treat for 10 years 03/28/2018: Atypical lobular hyperplasia left breast biopsy, left lumpectomy: Fibrocystic change   Tamoxifen toxicities: Denies any hot flashes or myalgias, complains of hair thinning. Endometrial thickening: She no longer has a white discharge.   Osteoporosis: DEXA scan November 2015 showed a T score of -2.6 (because of which we switched treatment to tamoxifen): Prolia was offered but patient refused. Bone density 12/16/2020: T score -2.5: Osteoporosis (given previous adverse effects to bisphosphonates, patient wants to watch and monitor)--T score of spine improved from -2.6 to -2.2.   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

## 2023-09-17 ENCOUNTER — Other Ambulatory Visit: Payer: Self-pay | Admitting: Physician Assistant

## 2023-10-04 ENCOUNTER — Telehealth: Payer: Self-pay

## 2023-10-04 NOTE — Telephone Encounter (Signed)
 Attempted to reach pt in regards to letter received from [facility]. It stated that they are unable to reach pt and for pt to contact them back.   Left a a voicemail to call [facility] or [place] back.

## 2023-10-09 NOTE — Telephone Encounter (Signed)
 Attempted to follow up with pt on [facility] referral. Left a voicemail to call us back.

## 2023-10-16 DIAGNOSIS — M79671 Pain in right foot: Secondary | ICD-10-CM | POA: Diagnosis not present

## 2023-10-16 DIAGNOSIS — G5761 Lesion of plantar nerve, right lower limb: Secondary | ICD-10-CM | POA: Diagnosis not present

## 2023-10-16 DIAGNOSIS — M25561 Pain in right knee: Secondary | ICD-10-CM | POA: Diagnosis not present

## 2023-10-17 ENCOUNTER — Ambulatory Visit
Admission: RE | Admit: 2023-10-17 | Discharge: 2023-10-17 | Discharge status: Home / Self Care | Source: Ambulatory Visit | Attending: Otolaryngology

## 2023-10-17 DIAGNOSIS — E041 Nontoxic single thyroid nodule: Secondary | ICD-10-CM | POA: Diagnosis not present

## 2023-10-22 ENCOUNTER — Other Ambulatory Visit: Payer: Self-pay | Admitting: Internal Medicine

## 2023-11-13 DIAGNOSIS — M25561 Pain in right knee: Secondary | ICD-10-CM | POA: Diagnosis not present

## 2023-12-18 ENCOUNTER — Encounter: Payer: Self-pay | Admitting: Family Medicine

## 2023-12-18 ENCOUNTER — Other Ambulatory Visit (HOSPITAL_COMMUNITY)
Admission: RE | Admit: 2023-12-18 | Discharge: 2023-12-18 | Disposition: A | Discharge status: Home / Self Care | Source: Ambulatory Visit | Attending: Family Medicine | Admitting: Family Medicine

## 2023-12-18 ENCOUNTER — Ambulatory Visit (INDEPENDENT_AMBULATORY_CARE_PROVIDER_SITE_OTHER): Admitting: Family Medicine

## 2023-12-18 VITALS — BP 124/72 | HR 99 | Temp 98.6°F | Ht 67.0 in | Wt 166.2 lb

## 2023-12-18 DIAGNOSIS — N760 Acute vaginitis: Secondary | ICD-10-CM | POA: Diagnosis not present

## 2023-12-18 DIAGNOSIS — R300 Dysuria: Secondary | ICD-10-CM | POA: Diagnosis not present

## 2023-12-18 LAB — POCT URINALYSIS DIPSTICK
Bilirubin, UA: POSITIVE
Blood, UA: NEGATIVE
Glucose, UA: NEGATIVE
Ketones, UA: POSITIVE
Nitrite, UA: NEGATIVE
Protein, UA: POSITIVE — AB
Spec Grav, UA: >=1.03 — AB (ref 1.010–1.025)
Urobilinogen, UA: 0.2 U/dL
pH, UA: 5.5 (ref 5.0–8.0)

## 2023-12-18 MED ORDER — NYSTATIN 100000 UNIT/GM EX CREA: 1.0000 | TOPICAL_CREAM | Freq: Two times a day (BID) | CUTANEOUS | 0 refills | Status: AC

## 2023-12-18 NOTE — Progress Notes (Signed)
 Established Patient Office Visit   Subjective  Patient ID: Donna Manning, female    DOB: [birth year]  Age: 73 y.o. MRN: [id number]  Chief Complaint  Patient presents with   Medical Management of Chronic Issues    Vaginal itching, Started a week ago, patient has been using OTC     Patient is a 73 year old female followed by Dr. Ethel Henry is seen for acute concern.  Patient endorses external vaginal irritation x 2 weeks.  States skin has a "crawly" sensation.  Patient denies discharge, bleeding, dysuria, urinary frequency.  Unsure of rash.  Tried OTC creams and suppositories.    Patient Active Problem List   Diagnosis Date Noted   Barrett's esophagus 06/20/2021   Aspiration into airway 04/24/2018   Dyspepsia 10/27/2014   Colon cancer screening 10/27/2014   Breast cancer of upper-outer quadrant of right female breast (HCC) 12/31/2013   Visit for preventive health examination 12/03/2011   High triglycerides 12/03/2011   Osteopenia    CAROTID BRUIT 07/16/2010   Carotid bruit 07/16/2010   WEIGHT LOSS 06/24/2010   NONSPECIFIC ABNORMAL ELECTROCARDIOGRAM 06/24/2010   SKIN LESION, UNCERTAIN SIGNIFICANCE 06/24/2010   Solar keratosis 06/24/2010   FATIGUE 06/01/2009   CHEST PAIN, ATYPICAL 06/01/2009   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   DYSFUNCTION OF EUSTACHIAN TUBE 12/10/2008   Esophagitis 06/21/2008   Esophagitis 06/21/2008   Hypothyroidism 05/12/2008   ADVERSE REACTION TO MEDICATION 05/12/2008   Hyperlipidemia 04/24/2007   GERD 04/24/2007   Disorder of bone and cartilage 04/24/2007   DVT, HX OF 04/24/2007   Past Medical History:  Diagnosis Date   Atypical lobular hyperplasia (ALH) of left breast 03/2018   Barrett's esophagus 2009   Benign head tremor    Breast cancer (HCC)    Breast cancer, right breast (HCC) 01/2014   "had 5 weeks radiation after lumpectomy" (04/24/2018)   Dental bridge present    lower left   Dental crowns present    GERD  (gastroesophageal reflux disease)    History of angina    History of DVT (deep vein thrombosis) ~ 1980   after a pregnancy   History of radiation therapy 2015   Hyperlipidemia    Hypothyroidism    Irregular heart beat    "dx'd by 1 dr; not noted by the cardiologist" (04/24/2018)   NSAID-induced gastric ulcer    Osteoporosis    Personal history of radiation therapy    PONV (postoperative nausea and vomiting)    Past Surgical History:  Procedure Laterality Date   BREAST BIOPSY Right 1991   BREAST EXCISIONAL BIOPSY Left 2019   BREAST LUMPECTOMY Right 2015   BREAST LUMPECTOMY W/ NEEDLE LOCALIZATION Left 04/24/2018   BREAST LUMPECTOMY WITH NEEDLE LOCALIZATION Left 04/24/2018   Procedure: LEFT BREAST LUMPECTOMY WITH NEEDLE LOCALIZATION;  Surgeon: Sim Dryer, MD;  Location: [facility];  Service: General;  Laterality: Left;   COLONOSCOPY     "w/hemorrhoid banding"   COLOSTOMY  2009   PARTIAL MASTECTOMY WITH NEEDLE LOCALIZATION AND AXILLARY SENTINEL LYMPH NODE BX Right 01/20/2014   Procedure: RIGHT PARTIAL MASTECTOMY WITH DOUBLE  NEEDLE LOCALIZATION (BRACKETED )AND AXILLARY SENTINEL LYMPH NODE BX;  Surgeon: Levert Ready, MD;  Location: [facility];  Service: General;  Laterality: Right;  And [place].   RE-EXCISION OF BREAST CANCER,SUPERIOR MARGINS Right 02/05/2014   Procedure: RIGHT LUMPECTOMY, RE-EXCISION OF BREAST CANCER,SUPERIOR MARGINS;  Surgeon: Levert Ready, MD;  Location: [facility];  Service: General;  Laterality:  Right;   UPPER GASTROINTESTINAL ENDOSCOPY  2016   UPPER GI ENDOSCOPY  12/25/2014   with Propofol    Social History   Tobacco Use   Smoking status: Never   Smokeless tobacco: Never  Vaping Use   Vaping status: Never Used  Substance Use Topics   Alcohol use: Not Currently    Comment: 04/24/2018 "drank a few beers in college"   Drug use: Never   Family History  Problem Relation Age of Onset   Heart failure  Mother 45   Diabetes Mother    Parkinsonism Father 20   Stroke Father    Thyroid  disease Sister    Stomach cancer Paternal Grandfather    Prostate cancer Brother    Colon polyps Neg Hx    Crohn's disease Neg Hx    Esophageal cancer Neg Hx    Rectal cancer Neg Hx    Colon cancer Neg Hx    BRCA 1/2 Neg Hx    Breast cancer Neg Hx    Allergies  Allergen Reactions   Contrast Media [Iodinated Contrast Media] Hives and Shortness Of Breath   Actonel [Risedronate Sodium] Other (See Comments)    HIP PAIN   Starch Rash    IN LATEX GLOVES    ROS Negative unless stated above    Objective:      BP 124/72 (BP Location: Left Arm, Patient Position: Sitting, Cuff Size: Normal)   Pulse 99   Temp 98.6 F (37 C) (Oral)   Ht 5\' 7"  (1.702 m)   Wt 166 lb 3.2 oz (75.4 kg)   SpO2 94%   BMI 26.03 kg/m  BP Readings from Last 3 Encounters:  12/18/23 124/72  05/09/23 102/70  04/11/23 104/60   Wt Readings from Last 3 Encounters:  12/18/23 166 lb 3.2 oz (75.4 kg)  05/09/23 176 lb (79.8 kg)  04/11/23 171 lb 9.6 oz (77.8 kg)      Physical Exam Genitourinary:    Labia:        Left: Rash present.        Comments: Erythema of bilateral labia majora.  Scant whitish discharge at introitus.       06/08/2023    9:07 AM 04/11/2023   11:20 AM 11/04/2022    3:34 PM  Depression screen PHQ 2/9  Decreased Interest 0 0 0  Down, Depressed, Hopeless 0 0 0  PHQ - 2 Score 0 0 0  Altered sleeping 0 1   Tired, decreased energy 0 1   Change in appetite 0 0   Feeling bad or failure about yourself  0 0   Trouble concentrating 0 0   Moving slowly or fidgety/restless 0 0   Suicidal thoughts 0 0   PHQ-9 Score 0 2   Difficult doing work/chores Not difficult at all Not difficult at all       04/11/2023   11:22 AM  GAD 7 : Generalized Anxiety Score  Nervous, Anxious, on Edge 0  Control/stop worrying 0  Worry too much - different things 0  Trouble relaxing 0  Restless 0  Easily annoyed or  irritable 0  Afraid - awful might happen 1  Total GAD 7 Score 1  Anxiety Difficulty Not difficult at all     No results found for any visits on 12/18/23.    Assessment & Plan:   Vulvovaginitis -     Cervicovaginal ancillary only -     POCT urinalysis dipstick  Patient with acute irritation of external female genitalia.  Discussed possible causes including intertrigo, vaginal candidiasis.  Also consider vaginal dryness and/or medication side effect as taking tamoxifen . Aptima swab collected.  POC UA collected and UCx.  Rx for nystatin cream given.  Further recommendations based on results.  Return if symptoms worsen or fail to improve.   Viola Greulich, MD

## 2023-12-19 ENCOUNTER — Telehealth: Payer: Self-pay

## 2023-12-19 LAB — CERVICOVAGINAL ANCILLARY ONLY
Bacterial Vaginitis (gardnerella): NEGATIVE
Candida Glabrata: NEGATIVE
Candida Vaginitis: NEGATIVE
Comment: NEGATIVE
Comment: NEGATIVE
Comment: NEGATIVE
Comment: NEGATIVE
Trichomonas: NEGATIVE

## 2023-12-19 LAB — URINE CULTURE
MICRO NUMBER:: 16526626
Result:: NO GROWTH
SPECIMEN QUALITY:: ADEQUATE

## 2023-12-19 NOTE — Telephone Encounter (Signed)
 Copied from CRM [id number]. Topic: Clinical - Lab/Test Results >> Dec 19, 2023  4:03 PM [place] J wrote: Reason for CRM: Patient is calling for her results from Dr. Arliss Lam. Results came back abnormal but I let the patient know that we cannot do anything until the provider reviews the tests and adds notes.

## 2023-12-20 ENCOUNTER — Ambulatory Visit: Payer: Self-pay | Admitting: Family Medicine

## 2023-12-20 NOTE — Telephone Encounter (Signed)
 Please note this provider was out of the office yesterday.  The POC UA was abnormal but the rest of the results results did not come back abnormal.  The urine culture and Aptima swab were both negative.

## 2023-12-29 ENCOUNTER — Other Ambulatory Visit: Payer: Self-pay | Admitting: Physician Assistant

## 2024-01-01 ENCOUNTER — Encounter: Payer: Self-pay | Admitting: Internal Medicine

## 2024-01-21 ENCOUNTER — Other Ambulatory Visit: Payer: Self-pay | Admitting: Internal Medicine

## 2024-01-22 ENCOUNTER — Other Ambulatory Visit: Payer: Self-pay

## 2024-01-22 DIAGNOSIS — E785 Hyperlipidemia, unspecified: Secondary | ICD-10-CM

## 2024-01-22 MED ORDER — ROSUVASTATIN CALCIUM 10 MG PO TABS: 10.0000 mg | ORAL_TABLET | Freq: Every day | ORAL | 0 refills | Status: DC

## 2024-01-23 ENCOUNTER — Other Ambulatory Visit: Payer: Self-pay | Admitting: Physician Assistant

## 2024-01-24 ENCOUNTER — Other Ambulatory Visit: Payer: Self-pay

## 2024-01-24 DIAGNOSIS — E785 Hyperlipidemia, unspecified: Secondary | ICD-10-CM

## 2024-01-24 MED ORDER — ROSUVASTATIN CALCIUM 10 MG PO TABS: 10.0000 mg | ORAL_TABLET | Freq: Every day | ORAL | 0 refills | Status: DC

## 2024-01-27 ENCOUNTER — Other Ambulatory Visit: Payer: Self-pay | Admitting: Physician Assistant

## 2024-02-09 ENCOUNTER — Ambulatory Visit: Payer: Self-pay

## 2024-02-09 DIAGNOSIS — T63441A Toxic effect of venom of bees, accidental (unintentional), initial encounter: Secondary | ICD-10-CM | POA: Diagnosis not present

## 2024-02-09 DIAGNOSIS — R600 Localized edema: Secondary | ICD-10-CM | POA: Diagnosis not present

## 2024-02-09 NOTE — Telephone Encounter (Signed)
 FYI Only or Action Required?: FYI only for provider.  Patient was last seen in primary care on 12/18/2023 by Mercer Clotilda SAUNDERS, MD.  Called Nurse Triage reporting Insect Bite.  Symptoms began several days ago.  Interventions attempted: Nothing.  Symptoms are: gradually worsening.  Triage Disposition: See Physician Within 24 Hours  Patient/caregiver understands and will follow disposition?: Yes   To UC     Copied from CRM [id number]. Topic: Clinical - Red Word Triage >> Feb 09, 2024  2:52 PM Donna Manning wrote: Red Word that prompted transfer to Nurse Triage: patient was stung by an insect swelling daily and red. She has tried to medicate this issue nothing seems to work Reason for Disposition  [1] Red or very tender (to touch) area AND [2] started over 24 hours after the bite  Answer Assessment - Initial Assessment Questions 1. TYPE of INSECT: What type of insect was it?      Hornet or yellow jacket was in the ground 2. ONSET: When did you get bitten?      Tuesday 3. LOCATION: Where is the insect bite located?      Lower left leg 4. REDNESS: Is the area red or pink? If Yes, ask: What size is the area of redness? (inches or cm). When did the redness start?     Red swollen, whole foot swollen 5. PAIN: Is there any pain? If Yes, ask: How bad is the pain? (Scale 0-10; or none, mild, moderate, severe)     mild 6. ITCHING: Does it itch? If Yes, ask: How bad is the itch?      Yes, moderate 7. SWELLING: How big is the swelling? (e.g., inches, cm, or compare to coins)     severe 8. OTHER SYMPTOMS: Do you have any other symptoms?  (e.g., difficulty breathing, fever, hives)     no 9. PREGNANCY: Is there any chance you are pregnant? When was your last menstrual period?     na  Protocols used: Insect Bite-A-AH

## 2024-02-25 ENCOUNTER — Other Ambulatory Visit: Payer: Self-pay | Admitting: Internal Medicine

## 2024-02-25 DIAGNOSIS — E785 Hyperlipidemia, unspecified: Secondary | ICD-10-CM

## 2024-03-22 DIAGNOSIS — Z23 Encounter for immunization: Secondary | ICD-10-CM | POA: Diagnosis not present

## 2024-03-25 ENCOUNTER — Telehealth: Payer: Self-pay

## 2024-03-25 NOTE — Telephone Encounter (Signed)
 Copied from CRM [id number]. Topic: Clinical - Medication Question >> Mar 22, 2024 10:13 AM Tinnie BROCKS wrote: Reason for CRM: Bonnie from [facility] [phone] [address]) calling to see if he can get covid shot prescribed now since she is at the counter asking for it. Dr. Author not in to get it done immediately per CAL. Pt okay to wait for another day. Ey:[phone]

## 2024-04-08 DIAGNOSIS — Z23 Encounter for immunization: Secondary | ICD-10-CM | POA: Diagnosis not present

## 2024-04-11 ENCOUNTER — Ambulatory Visit: Admitting: Internal Medicine

## 2024-04-16 ENCOUNTER — Encounter: Payer: Self-pay | Admitting: Internal Medicine

## 2024-04-16 ENCOUNTER — Ambulatory Visit: Admitting: Internal Medicine

## 2024-04-16 VITALS — BP 114/76 | HR 64 | Temp 98.0°F | Ht 66.34 in | Wt 162.4 lb

## 2024-04-16 DIAGNOSIS — E785 Hyperlipidemia, unspecified: Secondary | ICD-10-CM | POA: Diagnosis not present

## 2024-04-16 DIAGNOSIS — M79604 Pain in right leg: Secondary | ICD-10-CM | POA: Diagnosis not present

## 2024-04-16 DIAGNOSIS — C50411 Malignant neoplasm of upper-outer quadrant of right female breast: Secondary | ICD-10-CM

## 2024-04-16 DIAGNOSIS — E039 Hypothyroidism, unspecified: Secondary | ICD-10-CM

## 2024-04-16 DIAGNOSIS — R739 Hyperglycemia, unspecified: Secondary | ICD-10-CM | POA: Diagnosis not present

## 2024-04-16 DIAGNOSIS — R252 Cramp and spasm: Secondary | ICD-10-CM | POA: Diagnosis not present

## 2024-04-16 DIAGNOSIS — M791 Myalgia, unspecified site: Secondary | ICD-10-CM

## 2024-04-16 DIAGNOSIS — Z170 Estrogen receptor positive status [ER+]: Secondary | ICD-10-CM

## 2024-04-16 DIAGNOSIS — M839 Adult osteomalacia, unspecified: Secondary | ICD-10-CM

## 2024-04-16 DIAGNOSIS — M79605 Pain in left leg: Secondary | ICD-10-CM | POA: Diagnosis not present

## 2024-04-16 DIAGNOSIS — M899 Disorder of bone, unspecified: Secondary | ICD-10-CM | POA: Diagnosis not present

## 2024-04-16 DIAGNOSIS — M25561 Pain in right knee: Secondary | ICD-10-CM | POA: Diagnosis not present

## 2024-04-16 LAB — LIPID PANEL
Cholesterol: 125 mg/dL (ref 0–200)
HDL: 58.4 mg/dL (ref >39.00)
LDL Cholesterol: 49 mg/dL (ref 0–99)
NonHDL: 66.31
Total CHOL/HDL Ratio: 2
Triglycerides: 88 mg/dL (ref 0.0–149.0)
VLDL: 17.6 mg/dL (ref 0.0–40.0)

## 2024-04-16 LAB — COMPREHENSIVE METABOLIC PANEL WITH GFR
ALT: 21 U/L (ref 0–35)
AST: 37 U/L (ref 0–37)
Albumin: 4.2 g/dL (ref 3.5–5.2)
Alkaline Phosphatase: 39 U/L (ref 39–117)
BUN: 11 mg/dL (ref 6–23)
CO2: 29 meq/L (ref 19–32)
Calcium: 9.1 mg/dL (ref 8.4–10.5)
Chloride: 105 meq/L (ref 96–112)
Creatinine, Ser: 0.73 mg/dL (ref 0.40–1.20)
GFR: 80.94 mL/min (ref >60.00)
Glucose, Bld: 90 mg/dL (ref 70–99)
Potassium: 4.2 meq/L (ref 3.5–5.1)
Sodium: 141 meq/L (ref 135–145)
Total Bilirubin: 0.5 mg/dL (ref 0.2–1.2)
Total Protein: 6.5 g/dL (ref 6.0–8.3)

## 2024-04-16 LAB — CBC WITH DIFFERENTIAL/PLATELET
Basophils Absolute: 0.1 K/uL (ref 0.0–0.1)
Basophils Relative: 1 % (ref 0.0–3.0)
Eosinophils Absolute: 0.2 K/uL (ref 0.0–0.7)
Eosinophils Relative: 3.1 % (ref 0.0–5.0)
HCT: 36.2 % (ref 36.0–46.0)
Hemoglobin: 11.9 g/dL — ABNORMAL LOW (ref 12.0–15.0)
Lymphocytes Relative: 30.7 % (ref 12.0–46.0)
Lymphs Abs: 1.5 K/uL (ref 0.7–4.0)
MCHC: 32.9 g/dL (ref 30.0–36.0)
MCV: 94.5 fl (ref 78.0–100.0)
Monocytes Absolute: 0.3 K/uL (ref 0.1–1.0)
Monocytes Relative: 6.7 % (ref 3.0–12.0)
Neutro Abs: 2.9 K/uL (ref 1.4–7.7)
Neutrophils Relative %: 58.5 % (ref 43.0–77.0)
Platelets: 200 K/uL (ref 150.0–400.0)
RBC: 3.84 Mil/uL — ABNORMAL LOW (ref 3.87–5.11)
RDW: 13.4 % (ref 11.5–15.5)
WBC: 5 K/uL (ref 4.0–10.5)

## 2024-04-16 LAB — IBC + FERRITIN
Ferritin: 73.6 ng/mL (ref 10.0–291.0)
Iron: 120 ug/dL (ref 42–145)
Saturation Ratios: 36.6 % (ref 20.0–50.0)
TIBC: 327.6 ug/dL (ref 250.0–450.0)
Transferrin: 234 mg/dL (ref 212.0–360.0)

## 2024-04-16 LAB — TSH: TSH: 1.24 u[IU]/mL (ref 0.35–5.50)

## 2024-04-16 LAB — VITAMIN D 25 HYDROXY (VIT D DEFICIENCY, FRACTURES): VITD: 52.27 ng/mL (ref 30.00–100.00)

## 2024-04-16 LAB — VITAMIN B12: Vitamin B-12: 400 pg/mL (ref 211–911)

## 2024-04-16 LAB — MAGNESIUM: Magnesium: 2.1 mg/dL (ref 1.5–2.5)

## 2024-04-16 LAB — HEMOGLOBIN A1C: Hgb A1c MFr Bld: 6.2 % (ref 4.6–6.5)

## 2024-04-16 LAB — C-REACTIVE PROTEIN: CRP: <0.5 mg/dL (ref 0.5–20.0)

## 2024-04-16 NOTE — Progress Notes (Signed)
 Chief Complaint  Patient presents with   Annual Exam    Pt is her annual check up. Pt she has lots of problem with my legs    HPI: Patient  Donna Manning  73 y.o. comes in today foryearly  visit  and anumber of concerns   Thyorid:  changed pill in past 4 months  wonders if as accurate  Sees cards   on crestor   Dr Okey  Breast cancer  still on tamoxifen   no gyne sx but asks about gyne evaluation GI : no change?  Hx polyps  Has been having a tough time with legs pains cramps and then episode of right medial knee leg pain excruciating and though about going to ED . Happened last week  . Was told in  past she had cartilage problem this knee but injectio helped and this is different  Foot injection  helped.  Per dr Kit  right  foot  Prone to chalrie hoses  and taking magnesium.  Per  oncology team   Describes above as  Shooting pain  below knee both sides feels like charlie  horse but not started  and last week had  bac pain worse ever  left medial area leg.   Worried about upcoming drive  to [place] . For Wedding  if pain flares again . ( husband has alzhiemers )  Health Maintenance  Topic Date Due   DTaP/Tdap/Td (2 - Td or Tdap) 11/29/2021   Medicare Annual Wellness (AWV)  06/07/2024   COVID-19 Vaccine (8 - Moderna risk 2024-25 season) 10/06/2024   Colonoscopy  03/08/2028   Pneumococcal Vaccine: 50+ Years  Completed   Influenza Vaccine  Completed   DEXA SCAN  Completed   Hepatitis C Screening  Completed   Zoster Vaccines- Shingrix  Completed   HPV VACCINES  Aged Out   Meningococcal B Vaccine  Aged Out   Mammogram  Discontinued   Health Maintenance Review LIFESTYLE:  Exercise:  6000 steps.  In day .  Tobacco/ETS: Alcohol:  Sugar beverages: Sleep: 0  legs keep her up .  Drug use: no HH of  2  no pets     ROS:  REST of 12 system review negative except as per HPI no current cp sob   specific claidication    Past Medical History:  Diagnosis Date   Atypical  lobular hyperplasia (ALH) of left breast 03/2018   Barrett's esophagus 2009   Benign head tremor    Breast cancer (HCC)    Breast cancer, right breast (HCC) 01/2014   had 5 weeks radiation after lumpectomy (04/24/2018)   Dental bridge present    lower left   Dental crowns present    GERD (gastroesophageal reflux disease)    History of angina    History of DVT (deep vein thrombosis) ~ 1980   after a pregnancy   History of radiation therapy 2015   Hyperlipidemia    Hypothyroidism    Irregular heart beat    dx'd by 1 dr; not noted by the cardiologist (04/24/2018)   NSAID-induced gastric ulcer    Osteoporosis    Personal history of radiation therapy    PONV (postoperative nausea and vomiting)     Past Surgical History:  Procedure Laterality Date   BREAST BIOPSY Right 1991   BREAST EXCISIONAL BIOPSY Left 2019   BREAST LUMPECTOMY Right 2015   BREAST LUMPECTOMY W/ NEEDLE LOCALIZATION Left 04/24/2018   BREAST LUMPECTOMY WITH NEEDLE LOCALIZATION Left 04/24/2018  Procedure: LEFT BREAST LUMPECTOMY WITH NEEDLE LOCALIZATION;  Surgeon: Vanderbilt Ned, MD;  Location: [facility];  Service: General;  Laterality: Left;   COLONOSCOPY     w/hemorrhoid banding   COLOSTOMY  2009   PARTIAL MASTECTOMY WITH NEEDLE LOCALIZATION AND AXILLARY SENTINEL LYMPH NODE BX Right 01/20/2014   Procedure: RIGHT PARTIAL MASTECTOMY WITH DOUBLE  NEEDLE LOCALIZATION (BRACKETED )AND AXILLARY SENTINEL LYMPH NODE BX;  Surgeon: Elon CHRISTELLA Pacini, MD;  Location: [facility];  Service: General;  Laterality: Right;  And Axilla.   RE-EXCISION OF BREAST CANCER,SUPERIOR MARGINS Right 02/05/2014   Procedure: RIGHT LUMPECTOMY, RE-EXCISION OF BREAST CANCER,SUPERIOR MARGINS;  Surgeon: Elon CHRISTELLA Pacini, MD;  Location: [facility];  Service: General;  Laterality: Right;   UPPER GASTROINTESTINAL ENDOSCOPY  2016   UPPER GI ENDOSCOPY  12/25/2014   with Propofol     Family History   Problem Relation Age of Onset   Heart failure Mother 32   Diabetes Mother    Parkinsonism Father 67   Stroke Father    Thyroid  disease Sister    Stomach cancer Paternal Grandfather    Prostate cancer Brother    Colon polyps Neg Hx    Crohn's disease Neg Hx    Esophageal cancer Neg Hx    Rectal cancer Neg Hx    Colon cancer Neg Hx    BRCA 1/2 Neg Hx    Breast cancer Neg Hx     Social History   Socioeconomic History   Marital status: Married    Spouse name: Not on file   Number of children: 5   Years of education: Not on file   Highest education level: Not on file  Occupational History   Occupation: homemaker  Tobacco Use   Smoking status: Never   Smokeless tobacco: Never  Vaping Use   Vaping status: Never Used  Substance and Sexual Activity   Alcohol use: Not Currently    Comment: 04/24/2018 drank a few beers in college   Drug use: Never   Sexual activity: Not Currently  Other Topics Concern   Not on file  Social History Narrative   Not on file   Social Drivers of Health   Financial Resource Strain: Low Risk  (06/08/2023)   Overall Financial Resource Strain (CARDIA)    Difficulty of Paying Living Expenses: Not hard at all  Food Insecurity: No Food Insecurity (06/08/2023)   Hunger Vital Sign    Worried About Running Out of Food in the Last Year: Never true    Ran Out of Food in the Last Year: Never true  Transportation Needs: No Transportation Needs (06/08/2023)   PRAPARE - Administrator, Civil Service (Medical): No    Lack of Transportation (Non-Medical): No  Physical Activity: Inactive (06/08/2023)   Exercise Vital Sign    Days of Exercise per Week: 0 days    Minutes of Exercise per Session: 0 min  Stress: No Stress Concern Present (06/08/2023)   [employer] of Occupational Health - Occupational Stress Questionnaire    Feeling of Stress : Not at all  Social Connections: Moderately Integrated (06/08/2023)   Social Connection and  Isolation Panel    Frequency of Communication with Friends and Family: More than three times a week    Frequency of Social Gatherings with Friends and Family: Three times a week    Attends Religious Services: More than 4 times per year    Active Member of Clubs or Organizations: No  Attends Banker Meetings: Never    Marital Status: Married    Outpatient Medications Prior to Visit  Medication Sig Dispense Refill   Calcium  Carbonate-Vit D-Min (CALCIUM  1200 PO) Take by mouth daily at 6 (six) AM.     cetirizine (ZYRTEC) 5 MG tablet Take by mouth as needed for allergies.     Cholecalciferol (VITAMIN D) 50 MCG (2000 UT) CAPS Take by mouth daily at 6 (six) AM.     levothyroxine  (SYNTHROID ) 88 MCG tablet TAKE 1 TABLET BY MOUTH EVERY DAY 90 tablet 0   MAGNESIUM PO Take by mouth daily.     Multiple Vitamins-Minerals (MULTIVITAMIN ADULTS 50+ PO) Take 1 tablet by mouth daily.     omeprazole  (PRILOSEC) 40 MG capsule TAKE 1 CAPSULE 2 TIMES DAILY BEFORE A MEAL. PLEASE SCHEDULE A YEARLY FOLLOW UP FOR FURTHER REFILLS 60 capsule 0   rosuvastatin  (CRESTOR ) 10 MG tablet TAKE 1 TABLET BY MOUTH EVERY DAY 90 tablet 1   tamoxifen  (NOLVADEX ) 20 MG tablet Take 1 tablet (20 mg total) by mouth daily. 90 tablet 3   nystatin  cream (MYCOSTATIN ) Apply 1 Application topically 2 (two) times daily. (Patient not taking: Reported on 04/16/2024) 30 g 0   nitrofurantoin , macrocrystal-monohydrate, (MACROBID ) 100 MG capsule Take 1 capsule (100 mg total) by mouth 2 (two) times daily. (Patient not taking: Reported on 04/16/2024) 10 capsule 0   Facility-Administered Medications Prior to Visit  Medication Dose Route Frequency Provider Last Rate Last Admin   0.9 %  sodium chloride  infusion  500 mL Intravenous Once Nandigam, Kavitha V, MD         EXAM:  BP 114/76 (BP Location: Left Arm, Patient Position: Sitting, Cuff Size: Normal)   Pulse 64   Temp 98 F (36.7 C) (Oral)   Ht 5' 6.34 (1.685 m)   Wt 162 lb 6.4 oz  (73.7 kg)   SpO2 99%   BMI 25.94 kg/m   Body mass index is 25.94 kg/m. Wt Readings from Last 3 Encounters:  04/16/24 162 lb 6.4 oz (73.7 kg)  12/18/23 166 lb 3.2 oz (75.4 kg)  05/09/23 176 lb (79.8 kg)    Physical Exam: Vital signs reviewed HZW:Uypd is a well-developed well-nourished alert cooperative    who appearsr stated age in no acute distress.  HEENT: normocephalic atraumatic , Eyes: PERRL EOM's full, conjunctiva clear, Nares: paten,t no deformity discharge or tenderness., Ears: no deformity EAC's clear TMs with normal landmarks. Mouth: clear OP, no lesions, edema.  Moist mucous membranes. Dentition in adequate repair. NECK: supple without masses, thyromegaly or bruits. CHEST/PULM:  Clear to auscultation and percussion breath sounds equal no wheeze , rales or rhonchi.CV: PMI is nondisplaced, S1 S2 no gallops, murmurs, rubs. Peripheral pulses are full without delay.No JVD .  ABDOMEN: Bowel sounds normal nontender  No guard or rebound, no hepato splenomegal no CVA tenderness.   Extremtities:  No clubbing cyanosis or edema, no acute joint swelling or redness no focal atrophy Pints to medial lright knee area of excruciating pain hx  no edema  pulses intact no large venous changes  NEURO:  Oriented x3, cranial nerves 3-12 appear to be intact, no obvious focal weaknessSKIN: No acute rashes normal turgor, color, no bruising or petechiae. PSYCH: Oriented, good eye contact, no obvious depression anxiety, cognition and judgment appear normal. LN: no cervical axillary adenopathy  Lab Results  Component Value Date   WBC 5.0 04/16/2024   HGB 11.9 (L) 04/16/2024   HCT 36.2 04/16/2024   PLT  200.0 04/16/2024   GLUCOSE 90 04/16/2024   CHOL 125 04/16/2024   TRIG 88.0 04/16/2024   HDL 58.40 04/16/2024   LDLDIRECT 147.2 11/23/2011   LDLCALC 49 04/16/2024   ALT 21 04/16/2024   AST 37 04/16/2024   NA 141 04/16/2024   K 4.2 04/16/2024   CL 105 04/16/2024   CREATININE 0.73 04/16/2024   BUN  11 04/16/2024   CO2 29 04/16/2024   TSH 1.24 04/16/2024   HGBA1C 6.2 04/16/2024    BP Readings from Last 3 Encounters:  04/16/24 114/76  12/18/23 124/72  05/09/23 102/70    Lab plan  reviewed with patient   ASSESSMENT AND PLAN:  Discussed the following assessment and plan:    ICD-10-CM   1. Pain in both lower extremities  M79.604 CBC with Differential/Platelet   M79.605 Comprehensive metabolic panel with GFR    Lipid panel    TSH    Vitamin D, 25-hydroxy    Magnesium    Hemoglobin A1c    Vitamin B12    C-reactive protein    IBC + Ferritin    Ambulatory referral to Vascular Surgery    Ambulatory referral to Sports Medicine    2. Acute pain of right knee  M25.561 CBC with Differential/Platelet    Comprehensive metabolic panel with GFR    Lipid panel    TSH    Vitamin D, 25-hydroxy    Magnesium    Hemoglobin A1c    Vitamin B12    C-reactive protein    IBC + Ferritin    Ambulatory referral to Sports Medicine    3. Leg cramps  R25.2 CBC with Differential/Platelet    Comprehensive metabolic panel with GFR    Lipid panel    TSH    Vitamin D, 25-hydroxy    Magnesium    Hemoglobin A1c    Vitamin B12    C-reactive protein    IBC + Ferritin    Ambulatory referral to Vascular Surgery    4. Hypothyroidism, unspecified type  E03.9 CBC with Differential/Platelet    Comprehensive metabolic panel with GFR    Lipid panel    TSH    Vitamin D, 25-hydroxy    Magnesium    Hemoglobin A1c    Vitamin B12    C-reactive protein    IBC + Ferritin   monitor med today    5. Malignant neoplasm of upper-outer quadrant of right breast in female, estrogen receptor positive (HCC)  C50.411 CBC with Differential/Platelet   Z17.0 Comprehensive metabolic panel with GFR    Lipid panel    TSH    Vitamin D, 25-hydroxy    Magnesium    Hemoglobin A1c    Vitamin B12    C-reactive protein    IBC + Ferritin    6. Hyperlipidemia, unspecified hyperlipidemia type  E78.5 CBC with  Differential/Platelet    Comprehensive metabolic panel with GFR    Lipid panel    TSH    Vitamin D, 25-hydroxy    Magnesium    Hemoglobin A1c    Vitamin B12    C-reactive protein    IBC + Ferritin    7. Muscular pain  M79.10 Vitamin D, 25-hydroxy    8. Bone disorder  M89.9 CBC with Differential/Platelet    Comprehensive metabolic panel with GFR    Lipid panel    TSH    Vitamin D, 25-hydroxy    Magnesium    Hemoglobin A1c    Vitamin B12  C-reactive protein    IBC + Ferritin    9. Adult muscle ms pain  unspecified  M83.9 Vitamin D, 25-hydroxy    10. Hyperglycemia  R73.9 CBC with Differential/Platelet    Comprehensive metabolic panel with GFR    Lipid panel    TSH    Vitamin D, 25-hydroxy    Magnesium    Hemoglobin A1c    Vitamin B12    C-reactive protein    IBC + Ferritin    New onset  significant  leg pains,   not clearly vascular but  advise evaluation  r/o metabolic dm etc . Vit d and b12 check  And think recnet  episode was knee related  and different   fu Dr Kit as planned  Refer back to sm about knee and   then go from there  Consider trial off of Crestor   if all negative and ok with Dr Okey team  [facility] in past and may be prudent to  evaluate with her breast cancer and antiestrogen rx for now .    No follow-ups on file.  Patient Care Team: Kabeer Hoagland, Apolinar POUR, MD as PCP - General Okey Vina GAILS, MD as PCP - Cardiology (Cardiology) Kandyce Sor, MD as Attending Physician (Obstetrics and Gynecology) Robinson Idol, MD (Ophthalmology) Keenan Hastings, MD as Consulting Physician (Radiation Oncology) Shila Gustav GAILS, MD as Consulting Physician (Gastroenterology) Odean Potts, MD as Consulting Physician (Hematology and Oncology) Patient Instructions  Good to see you today . Think the r  leg pain is knee related  and we can get you back to dr Joane or our [facility]. Can share with dr Kit about leg pains   in interim .   Will do  vascular  consult but pulses are good today Checking  metabolic thyroid  etc  for other causes of leg pains.   Although not urgent and you have no sx reasonable to get a gyne check because of hx of breast cancer and on meds.  Plan fu depending on results and then go from there  . After evaluation  vascular , ortho SM etc Santasia Rew K. Quamel Fitzmaurice M.D.

## 2024-04-16 NOTE — Patient Instructions (Addendum)
 Good to see you today . Think the r  leg pain is knee related  and we can get you back to dr Joane or our [facility]. Can share with dr Kit about leg pains   in interim .   Will do vascular  consult but pulses are good today Checking  metabolic thyroid  etc  for other causes of leg pains.   Although not urgent and you have no sx reasonable to get a gyne check because of hx of breast cancer and on meds.  Plan fu depending on results and then go from there  . After evaluation  vascular , ortho SM etc

## 2024-04-17 ENCOUNTER — Ambulatory Visit: Payer: Self-pay | Admitting: Internal Medicine

## 2024-04-17 DIAGNOSIS — D649 Anemia, unspecified: Secondary | ICD-10-CM

## 2024-04-17 NOTE — Progress Notes (Signed)
 Except for bordereline anemia  all albs in range  and no diabetes and thyroid  in  range.  No new explanation for the leg pains .  I have  placed the vascular consult referral and  referral to Sports medicine about the knee pain.

## 2024-04-18 ENCOUNTER — Other Ambulatory Visit: Payer: Self-pay | Admitting: *Deleted

## 2024-04-18 DIAGNOSIS — I739 Peripheral vascular disease, unspecified: Secondary | ICD-10-CM

## 2024-04-24 ENCOUNTER — Encounter: Payer: Self-pay | Admitting: Family Medicine

## 2024-04-24 ENCOUNTER — Other Ambulatory Visit: Payer: Self-pay

## 2024-04-24 ENCOUNTER — Ambulatory Visit (INDEPENDENT_AMBULATORY_CARE_PROVIDER_SITE_OTHER)

## 2024-04-24 ENCOUNTER — Ambulatory Visit (INDEPENDENT_AMBULATORY_CARE_PROVIDER_SITE_OTHER): Admitting: Family Medicine

## 2024-04-24 VITALS — BP 104/62 | HR 78 | Ht 66.34 in | Wt 158.0 lb

## 2024-04-24 DIAGNOSIS — M79662 Pain in left lower leg: Secondary | ICD-10-CM

## 2024-04-24 DIAGNOSIS — M542 Cervicalgia: Secondary | ICD-10-CM | POA: Diagnosis not present

## 2024-04-24 DIAGNOSIS — M79661 Pain in right lower leg: Secondary | ICD-10-CM

## 2024-04-24 DIAGNOSIS — M47812 Spondylosis without myelopathy or radiculopathy, cervical region: Secondary | ICD-10-CM | POA: Diagnosis not present

## 2024-04-24 DIAGNOSIS — M47816 Spondylosis without myelopathy or radiculopathy, lumbar region: Secondary | ICD-10-CM | POA: Diagnosis not present

## 2024-04-24 DIAGNOSIS — G8929 Other chronic pain: Secondary | ICD-10-CM | POA: Diagnosis not present

## 2024-04-24 DIAGNOSIS — M25561 Pain in right knee: Secondary | ICD-10-CM | POA: Diagnosis not present

## 2024-04-24 DIAGNOSIS — M25562 Pain in left knee: Secondary | ICD-10-CM | POA: Diagnosis not present

## 2024-04-24 MED ORDER — BACLOFEN 10 MG PO TABS: 10.0000 mg | ORAL_TABLET | Freq: Every evening | ORAL | 3 refills | Status: DC | PRN

## 2024-04-24 NOTE — Progress Notes (Signed)
 I, Leotis Batter, CMA acting as a Neurosurgeon for Artist Lloyd, MD.  Donna Manning is a 73 y.o. female who presents to [facility] today for bilat lower leg pain x 2 years, worsening over the past 2 weeks, notes being unable to stand and feeling excruciating pain into the medial knee, medial inner thigh, and medial lower leg. Sx lasted for about 10 min. Denies swelling or bruising. Felt like a twisting deep inside. Continues to have pain at lateral aspect of the right knee. Also having mild left knee pain. Also notes popping in the right knee, especially at night. Pt describes pain as a charlie horse. Scheduled to have a ABI next week.  Low back pain: denies Radiating pain: B LE LE numbness/tingling: R foot, worse at night - following by Dr. Kit LE weakness: denies Aggravates: night time Treatments tried: Tylenol   Additionally patient does note some neck pain and no easy motion produced in her neck with range of motion.  Dx testing: 04/18/24 Vasc US  12/26/22 DEXA  Pertinent review of systems: No fevers or chills.  Patient does note charley horses and cramping.  Relevant historical information: History of breast cancer now on tamoxifen . Hypothyroidism  Exam:  BP 104/62   Pulse 78   Ht 5' 6.34 (1.685 m)   Wt 158 lb (71.7 kg)   SpO2 97%   BMI 25.24 kg/m  General: Well Developed, well nourished, and in no acute distress.   MSK: L-spine normal.  Normal lumbar motion lower extremity strength is intact.  Bilateral lower extremity no significant edema.  Not particularly tender to palpation.   Lab and Radiology Results  X-ray images lumbar spine and cervical spine obtained today personally and independently interpreted.  Cervical spine: Moderate degenerative changes worse at C2.  No acute fractures.  Lumbar spine: Mild degenerative changes.  No acute fractures.  Await formal radiology review    Assessment and Plan: 73 y.o. female with  bilateral lower extremity pain and cramping.  Patient did have a pretty extensive laboratory workup by her PCP which did not show severe abnormalities thankfully.  Additionally she has an ABI ordered and scheduled to be completed next week which will be helpful to evaluate for claudication.  Alternate diagnosis is could be lumbar radiculopathy or neurogenic claudication.  Lumbar spine x-ray does not show severe bony stenosis but nerve impingement is definitely a possibility which could produce similar symptoms.  For now we will try baclofen at bedtime for cramping and await results of ABI.  Recheck in a month.   PDMP not reviewed this encounter. Orders Placed This Encounter  Procedures   US  LIMITED JOINT SPACE STRUCTURES LOW RIGHT(NO LINKED CHARGES)    Reason for Exam (SYMPTOM  OR DIAGNOSIS REQUIRED):   right knee pain    Preferred imaging location?:   [facility] Lumbar Spine 2-3 Views    Standing Status:   Future    Number of Occurrences:   1    Expiration Date:   05/25/2024    Reason for Exam (SYMPTOM  OR DIAGNOSIS REQUIRED):   B LE pain    Preferred imaging location?:   [facility]   DG Cervical Spine 2 or 3 views    Standing Status:   Future    Number of Occurrences:   1    Expiration Date:   04/24/2025    Reason for Exam (SYMPTOM  OR DIAGNOSIS REQUIRED):   neck pain  Preferred imaging location?:   [facility] ordered this encounter  Medications   baclofen (LIORESAL) 10 MG tablet    Sig: Take 1 tablet (10 mg total) by mouth at bedtime as needed for muscle spasms.    Dispense:  30 each    Refill:  3     Discussed warning signs or symptoms. Please see discharge instructions. Patient expresses understanding.   The above documentation has been reviewed and is accurate and complete Artist Lloyd, M.D.

## 2024-04-24 NOTE — Patient Instructions (Addendum)
 Thank you for coming in today.   Please get an Xray today before you leave   Take Baclofen at bedtime.  Keep appointment for vascular ultrasound.   See you back in 1 month.

## 2024-04-27 ENCOUNTER — Other Ambulatory Visit: Payer: Self-pay | Admitting: Internal Medicine

## 2024-04-27 NOTE — Telephone Encounter (Signed)
 The borderline  anemia seems to come and go . Iron and b12 ok .  Rest of blood count is fine . There are many causes of this  many not serious . ( Assume  you dont donate blood)  Since the rest of blood count normal and severity is  minimal    I suggest we Repeat cbc diff in 3 months  and for from there .

## 2024-04-29 DIAGNOSIS — M25561 Pain in right knee: Secondary | ICD-10-CM | POA: Diagnosis not present

## 2024-04-29 DIAGNOSIS — G5761 Lesion of plantar nerve, right lower limb: Secondary | ICD-10-CM | POA: Diagnosis not present

## 2024-04-30 ENCOUNTER — Ambulatory Visit: Payer: Self-pay | Admitting: Family Medicine

## 2024-04-30 NOTE — Progress Notes (Signed)
 Low back x-ray does shows some arthritis.  No broken bones are visible.

## 2024-04-30 NOTE — Progress Notes (Signed)
 Cervical spine x-ray does not show any acute findings that would explain your symptoms.  We are waiting on the blood flow test.

## 2024-04-30 NOTE — Progress Notes (Unsigned)
 Patient name: Donna Manning MRN: [id number] DOB: 11-03-1950 Sex: female  REASON FOR CONSULT: Leg cramps  HPI: Donna Manning is a 73 y.o. female, with history of hyperlipidemia and breast cancer that presents as referral for evaluation of leg cramps and leg pain.  Patient describes cramping in both lower extremities usually at night.  This is worse in the right leg.  States it happens several times in the night and she has to get out of bed.  Recently had to get out of bed several weeks ago and had intense pain for about 10 minutes.  Less discomfort when walking.  No prior vascular inventions.  Past Medical History:  Diagnosis Date   Atypical lobular hyperplasia [facility]) of left breast 03/2018   Barrett's esophagus 2009   Benign head tremor    Breast cancer (HCC)    Breast cancer, right breast (HCC) 01/2014   had 5 weeks radiation after lumpectomy (04/24/2018)   Dental bridge present    lower left   Dental crowns present    GERD (gastroesophageal reflux disease)    History of angina    History of DVT (deep vein thrombosis) ~ 1980   after a pregnancy   History of radiation therapy 2015   Hyperlipidemia    Hypothyroidism    Irregular heart beat    dx'd by 1 dr; not noted by the cardiologist (04/24/2018)   NSAID-induced gastric ulcer    Osteoporosis    Personal history of radiation therapy    PONV (postoperative nausea and vomiting)     Past Surgical History:  Procedure Laterality Date   BREAST BIOPSY Right 1991   BREAST EXCISIONAL BIOPSY Left 2019   BREAST LUMPECTOMY Right 2015   BREAST LUMPECTOMY W/ NEEDLE LOCALIZATION Left 04/24/2018   BREAST LUMPECTOMY WITH NEEDLE LOCALIZATION Left 04/24/2018   Procedure: LEFT BREAST LUMPECTOMY WITH NEEDLE LOCALIZATION;  Surgeon: Vanderbilt Ned, MD;  Location: [facility];  Service: General;  Laterality: Left;   COLONOSCOPY     w/hemorrhoid banding   COLOSTOMY  2009   PARTIAL MASTECTOMY WITH NEEDLE  LOCALIZATION AND AXILLARY SENTINEL LYMPH NODE BX Right 01/20/2014   Procedure: RIGHT PARTIAL MASTECTOMY WITH DOUBLE  NEEDLE LOCALIZATION (BRACKETED )AND AXILLARY SENTINEL LYMPH NODE BX;  Surgeon: Elon CHRISTELLA Pacini, MD;  Location: [facility];  Service: General;  Laterality: Right;  And [place].   RE-EXCISION OF BREAST CANCER,SUPERIOR MARGINS Right 02/05/2014   Procedure: RIGHT LUMPECTOMY, RE-EXCISION OF BREAST CANCER,SUPERIOR MARGINS;  Surgeon: Elon CHRISTELLA Pacini, MD;  Location: [facility];  Service: General;  Laterality: Right;   UPPER GASTROINTESTINAL ENDOSCOPY  2016   UPPER GI ENDOSCOPY  12/25/2014   with Propofol     Family History  Problem Relation Age of Onset   Heart failure Mother 33   Diabetes Mother    Parkinsonism Father 40   Stroke Father    Thyroid  disease Sister    Stomach cancer Paternal Grandfather    Prostate cancer Brother    Colon polyps Neg Hx    Crohn's disease Neg Hx    Esophageal cancer Neg Hx    Rectal cancer Neg Hx    Colon cancer Neg Hx    BRCA 1/2 Neg Hx    Breast cancer Neg Hx     SOCIAL HISTORY: Social History   Socioeconomic History   Marital status: Married    Spouse name: Not on file   Number of children: 5   Years of education: Not  on file   Highest education level: Not on file  Occupational History   Occupation: homemaker  Tobacco Use   Smoking status: Never   Smokeless tobacco: Never  Vaping Use   Vaping status: Never Used  Substance and Sexual Activity   Alcohol use: Not Currently    Comment: 04/24/2018 drank a few beers in college   Drug use: Never   Sexual activity: Not Currently  Other Topics Concern   Not on file  Social History Narrative   Not on file   Social Drivers of Health   Financial Resource Strain: Low Risk  (06/08/2023)   Overall Financial Resource Strain (CARDIA)    Difficulty of Paying Living Expenses: Not hard at all  Food Insecurity: No Food Insecurity (06/08/2023)   Hunger Vital  Sign    Worried About Running Out of Food in the Last Year: Never true    Ran Out of Food in the Last Year: Never true  Transportation Needs: No Transportation Needs (06/08/2023)   PRAPARE - Administrator, Civil Service (Medical): No    Lack of Transportation (Non-Medical): No  Physical Activity: Inactive (06/08/2023)   Exercise Vital Sign    Days of Exercise per Week: 0 days    Minutes of Exercise per Session: 0 min  Stress: No Stress Concern Present (06/08/2023)   [employer] of Occupational Health - Occupational Stress Questionnaire    Feeling of Stress : Not at all  Social Connections: Moderately Integrated (06/08/2023)   Social Connection and Isolation Panel    Frequency of Communication with Friends and Family: More than three times a week    Frequency of Social Gatherings with Friends and Family: Three times a week    Attends Religious Services: More than 4 times per year    Active Member of Clubs or Organizations: No    Attends Banker Meetings: Never    Marital Status: Married  Catering manager Violence: Not At Risk (06/08/2023)   Humiliation, Afraid, Rape, and Kick questionnaire    Fear of Current or Ex-Partner: No    Emotionally Abused: No    Physically Abused: No    Sexually Abused: No    Allergies  Allergen Reactions   Contrast Media [Iodinated Contrast Media] Hives and Shortness Of Breath   Actonel [Risedronate Sodium] Other (See Comments)    HIP PAIN   Starch Rash    IN LATEX GLOVES    Current Outpatient Medications  Medication Sig Dispense Refill   baclofen (LIORESAL) 10 MG tablet Take 1 tablet (10 mg total) by mouth at bedtime as needed for muscle spasms. 30 each 3   Calcium  Carbonate-Vit D-Min (CALCIUM  1200 PO) Take by mouth daily at 6 (six) AM.     cetirizine (ZYRTEC) 5 MG tablet Take by mouth as needed for allergies.     Cholecalciferol (VITAMIN D) 50 MCG (2000 UT) CAPS Take by mouth daily at 6 (six) AM.     levothyroxine   (SYNTHROID ) 88 MCG tablet TAKE 1 TABLET BY MOUTH EVERY DAY 90 tablet 0   MAGNESIUM PO Take by mouth daily.     Multiple Vitamins-Minerals (MULTIVITAMIN ADULTS 50+ PO) Take 1 tablet by mouth daily.     nystatin  cream (MYCOSTATIN ) Apply 1 Application topically 2 (two) times daily. (Patient not taking: Reported on 04/24/2024) 30 g 0   omeprazole  (PRILOSEC) 40 MG capsule TAKE 1 CAPSULE 2 TIMES DAILY BEFORE A MEAL. PLEASE SCHEDULE A YEARLY FOLLOW UP FOR FURTHER REFILLS 60 capsule  0   rosuvastatin  (CRESTOR ) 10 MG tablet TAKE 1 TABLET BY MOUTH EVERY DAY 90 tablet 1   tamoxifen  (NOLVADEX ) 20 MG tablet Take 1 tablet (20 mg total) by mouth daily. 90 tablet 3   Current Facility-Administered Medications  Medication Dose Route Frequency Provider Last Rate Last Admin   0.9 %  sodium chloride  infusion  500 mL Intravenous Once Nandigam, Kavitha V, MD        REVIEW OF SYSTEMS:  [X]  denotes positive finding, [ ]  denotes negative finding Cardiac  Comments:  Chest pain or chest pressure:    Shortness of breath upon exertion:    Short of breath when lying flat:    Irregular heart rhythm:        Vascular    Pain in calf, thigh, or hip brought on by ambulation:    Pain in feet at night that wakes you up from your sleep:     Blood clot in your veins:    Leg swelling:         Pulmonary    Oxygen at home:    Productive cough:     Wheezing:         Neurologic    Sudden weakness in arms or legs:     Sudden numbness in arms or legs:     Sudden onset of difficulty speaking or slurred speech:    Temporary loss of vision in one eye:     Problems with dizziness:         Gastrointestinal    Blood in stool:     Vomited blood:         Genitourinary    Burning when urinating:     Blood in urine:        Psychiatric    Major depression:         Hematologic    Bleeding problems:    Problems with blood clotting too easily:        Skin    Rashes or ulcers:        Constitutional    Fever or chills:       PHYSICAL EXAM: There were no vitals filed for this visit.  GENERAL: The patient is a well-nourished female, in no acute distress. The vital signs are documented above. CARDIAC: There is a regular rate and rhythm.  VASCULAR:  Bilateral femoral pulses palpable Bilateral DP pulses palpable No lower extremity tissue loss PULMONARY: No respiratory distress ABDOMEN: Soft and non-tender. MUSCULOSKELETAL: There are no major deformities or cyanosis. NEUROLOGIC: No focal weakness or paresthesias are detected. SKIN: There are no ulcers or rashes noted. PSYCHIATRIC: The patient has a normal affect.  DATA:   ABI noncompressible with triphasic waveforms at the ankle  Assessment/Plan:  73 y.o. female, with history of hyperlipidemia and breast cancer that presents as referral for evaluation of leg cramps and leg pain.  Patient describes cramping in both lower extremities usually at night.  I discussed that I do not think her leg pain is related to significant arterial insufficiency.  Although her ABIs are noncompressible she has normal triphasic waveforms at the ankle.  She has easily palpable pedal pulses on exam that are 2+.  She endorses nocturnal cramping as her major complaint that is not consistent with vascular claudication that is usually effort-induced.  She can follow-up with me as needed.   Lonni DOROTHA Gaskins, MD [facility] Office: [phone]

## 2024-05-01 ENCOUNTER — Ambulatory Visit (INDEPENDENT_AMBULATORY_CARE_PROVIDER_SITE_OTHER): Admitting: Vascular Surgery

## 2024-05-01 ENCOUNTER — Ambulatory Visit (HOSPITAL_COMMUNITY)
Admission: RE | Admit: 2024-05-01 | Discharge: 2024-05-01 | Disposition: A | Discharge status: Home / Self Care | Source: Ambulatory Visit | Attending: Vascular Surgery | Admitting: Vascular Surgery

## 2024-05-01 ENCOUNTER — Encounter: Payer: Self-pay | Admitting: Vascular Surgery

## 2024-05-01 VITALS — BP 108/65 | HR 66 | Temp 97.9°F | Resp 18 | Ht 66.34 in | Wt 163.4 lb

## 2024-05-01 DIAGNOSIS — I739 Peripheral vascular disease, unspecified: Secondary | ICD-10-CM | POA: Insufficient documentation

## 2024-05-01 DIAGNOSIS — R252 Cramp and spasm: Secondary | ICD-10-CM | POA: Insufficient documentation

## 2024-05-01 LAB — VAS US ABI WITH/WO TBI
Left ABI: 1.42
Right ABI: 1.41

## 2024-05-02 ENCOUNTER — Encounter: Payer: Self-pay | Admitting: Internal Medicine

## 2024-05-03 NOTE — Telephone Encounter (Signed)
 Yes, stop statin  Message back in 2 to 3 weeks with how feeling

## 2024-05-29 ENCOUNTER — Ambulatory Visit: Admitting: Family Medicine

## 2024-06-03 ENCOUNTER — Other Ambulatory Visit: Payer: Self-pay | Admitting: Hematology and Oncology

## 2024-06-03 DIAGNOSIS — Z1231 Encounter for screening mammogram for malignant neoplasm of breast: Secondary | ICD-10-CM

## 2024-06-17 ENCOUNTER — Telehealth: Payer: Self-pay | Admitting: Gastroenterology

## 2024-06-17 MED ORDER — OMEPRAZOLE 40 MG PO CPDR: 40.0000 mg | DELAYED_RELEASE_CAPSULE | Freq: Two times a day (BID) | ORAL | 0 refills | Status: DC

## 2024-06-17 NOTE — Telephone Encounter (Signed)
 Refill sent to pharmacy.

## 2024-06-17 NOTE — Telephone Encounter (Signed)
 Patient requesting medication refill for omeprazole . Scheduled for next available apt with PA. Please advise.

## 2024-06-18 DIAGNOSIS — H04123 Dry eye syndrome of bilateral lacrimal glands: Secondary | ICD-10-CM | POA: Diagnosis not present

## 2024-06-18 DIAGNOSIS — H524 Presbyopia: Secondary | ICD-10-CM | POA: Diagnosis not present

## 2024-06-18 DIAGNOSIS — Z961 Presence of intraocular lens: Secondary | ICD-10-CM | POA: Diagnosis not present

## 2024-06-18 LAB — OPHTHALMOLOGY REPORT-SCANNED

## 2024-07-15 ENCOUNTER — Ambulatory Visit
Admission: RE | Admit: 2024-07-15 | Discharge: 2024-07-15 | Disposition: A | Source: Ambulatory Visit | Attending: Hematology and Oncology

## 2024-07-15 DIAGNOSIS — Z1231 Encounter for screening mammogram for malignant neoplasm of breast: Secondary | ICD-10-CM

## 2024-07-18 ENCOUNTER — Other Ambulatory Visit: Payer: Self-pay | Admitting: Gastroenterology

## 2024-07-21 ENCOUNTER — Other Ambulatory Visit: Payer: Self-pay | Admitting: Family Medicine

## 2024-07-22 NOTE — Telephone Encounter (Signed)
 Rx refill request approved per Dr. Zollie Pee orders.

## 2024-07-24 NOTE — Progress Notes (Signed)
 "  Chief Complaint: Refill Omeprazole   HPI:    Donna Manning is a 74 year old female with a past medical history as listed below including breast cancer, GERD, osteoporosis and multiple others, known to Dr. Shila, who returns to clinic today for follow-up of her reflux and a refill of Omeprazole .    Office visit with Dr. Nandigam in 2020, at that time discussed her possible history of Barrett's esophagus.  At the time Dr. Shila noted that this had been diagnosed at [facility], but on review of those prior EGDs there is no sign of Barrett's esophagus on biopsies.  Last EGD had been done in our clinic by Dr. Debrah in 2016 and biopsies negative for Barrett's esophagus.    06/2020 EGD with grade B esophagitis and biopsies from the GE junction showed no evidence of intestinal metaplasia or Barrett's.  Consistent with reflux.    02/2020 colonoscopy with scattered diverticulosis and external and internal hemorrhoids.  Removal of a 10 mm polyp in the ascending colon with path showing a sessile serrated polyp without high-grade dysplasia and repeat recommended in 3 years.    12/05/2022 patient seen by Greig Corti, PA-see for GERD.  Noted a chronic history of reflux and prior diagnosis of Barrett's made elsewhere.  At that time is because she been on Omeprazole  40 mg at bedtime chronically.  She was experiencing some new throat pain over the past 6 weeks.  Thyroid  nodule noted by PCP with ultrasound showing benign thyroid  nodule and repeat imaging in a year recommended.  Had seen ENT with laryngoscopy.  Noted muscle tension dysphonia.  Speech therapy recommended.  At that appointment Omeprazole  increased to 40 mg p.o. twice daily for 8 weeks.  Recommended EGD and colonoscopy.    03/09/2023 EGD with Z-line regular, 2 cm hiatal hernia and otherwise normal.  Patient told to use Omeprazole  40 mg p.o. daily.    03/09/2023 colonoscopy with one 6 mm polyp in the transverse colon, diverticulosis in the  sigmoid, descending, transverse and ascending colon.  Pathology showed tubular adenoma.  Nonbleeding external and internal hemorrhoids.  Repeat recommended in 5 years.  Discussed the use of AI scribe software for clinical note transcription with the patient, who gave verbal consent to proceed.  History of Present Illness   She is asymptomatic and continues Omeprazole  40 mg daily, rarely missing doses. No recurrence of heartburn, reflux, dysphagia, gas, or burping, even when doses are missed. She expresses interest in reducing her Omeprazole  dose.  Last upper endoscopy in August 2024 showed a 2 cm hiatal hernia with otherwise normal findings. She inquired about Barrett's esophagus; recent endoscopy and biopsies showed no evidence of Barrett's.  Colonoscopy performed on the same day identified a 6 mm tubular adenoma. She recalls being told the polyp was precancerous and is aware of recommendations for repeat colonoscopy in 5 years. She has had at least one prior polyp and prefers continued surveillance, citing family history of advanced age, living into their 7's.  She recently discontinued rosuvastatin  due to severe leg cramps, which resolved after stopping the medication. The cramps interfered with sleep, walking, and travel. She had been on rosuvastatin  for a long time without prior issues and did not have a recent dose increase. Statin therapy was initiated after a single episode of chest pressure described as an elephant on my chest. She plans to discuss further with PCP.  Denies fever, chills, weight loss, abdominal pain or blood in her stool.     Past Medical  History:  Diagnosis Date   Atypical lobular hyperplasia [facility]) of left breast 03/2018   Barrett's esophagus 2009   Benign head tremor    Breast cancer (HCC)    Breast cancer, right breast (HCC) 01/2014   had 5 weeks radiation after lumpectomy (04/24/2018)   Dental bridge present    lower left   Dental crowns present     GERD (gastroesophageal reflux disease)    History of angina    History of DVT (deep vein thrombosis) ~ 1980   after a pregnancy   History of radiation therapy 2015   Hyperlipidemia    Hypothyroidism    Irregular heart beat    dx'd by 1 dr; not noted by the cardiologist (04/24/2018)   NSAID-induced gastric ulcer    Osteoporosis    Personal history of radiation therapy    PONV (postoperative nausea and vomiting)     Past Surgical History:  Procedure Laterality Date   BREAST BIOPSY Right 1991   BREAST EXCISIONAL BIOPSY Left 2019   BREAST LUMPECTOMY Right 2015   BREAST LUMPECTOMY W/ NEEDLE LOCALIZATION Left 04/24/2018   BREAST LUMPECTOMY WITH NEEDLE LOCALIZATION Left 04/24/2018   Procedure: LEFT BREAST LUMPECTOMY WITH NEEDLE LOCALIZATION;  Surgeon: Vanderbilt Ned, MD;  Location: [facility];  Service: General;  Laterality: Left;   COLONOSCOPY     w/hemorrhoid banding   COLOSTOMY  2009   PARTIAL MASTECTOMY WITH NEEDLE LOCALIZATION AND AXILLARY SENTINEL LYMPH NODE BX Right 01/20/2014   Procedure: RIGHT PARTIAL MASTECTOMY WITH DOUBLE  NEEDLE LOCALIZATION (BRACKETED )AND AXILLARY SENTINEL LYMPH NODE BX;  Surgeon: Elon CHRISTELLA Pacini, MD;  Location: [facility];  Service: General;  Laterality: Right;  And [place].   RE-EXCISION OF BREAST CANCER,SUPERIOR MARGINS Right 02/05/2014   Procedure: RIGHT LUMPECTOMY, RE-EXCISION OF BREAST CANCER,SUPERIOR MARGINS;  Surgeon: Elon CHRISTELLA Pacini, MD;  Location: [facility];  Service: General;  Laterality: Right;   UPPER GASTROINTESTINAL ENDOSCOPY  2016   UPPER GI ENDOSCOPY  12/25/2014   with Propofol     Current Outpatient Medications  Medication Sig Dispense Refill   baclofen  (LIORESAL ) 10 MG tablet TAKE 1 TABLET BY MOUTH AT BEDTIME AS NEEDED FOR MUSCLE SPASMS 90 tablet 1   Calcium  Carbonate-Vit D-Min (CALCIUM  1200 PO) Take by mouth daily at 6 (six) AM.     cetirizine (ZYRTEC) 5 MG tablet Take by mouth as  needed for allergies.     Cholecalciferol (VITAMIN D ) 50 MCG (2000 UT) CAPS Take by mouth daily at 6 (six) AM.     levothyroxine  (SYNTHROID ) 88 MCG tablet TAKE 1 TABLET BY MOUTH EVERY DAY 90 tablet 0   MAGNESIUM PO Take by mouth daily.     Multiple Vitamins-Minerals (MULTIVITAMIN ADULTS 50+ PO) Take 1 tablet by mouth daily.     nystatin  cream (MYCOSTATIN ) Apply 1 Application topically 2 (two) times daily. (Patient not taking: Reported on 05/01/2024) 30 g 0   omeprazole  (PRILOSEC) 40 MG capsule TAKE 1 CAPSULE BY MOUTH TWICE A DAY 60 capsule 0   rosuvastatin  (CRESTOR ) 10 MG tablet TAKE 1 TABLET BY MOUTH EVERY DAY 90 tablet 1   tamoxifen  (NOLVADEX ) 20 MG tablet Take 1 tablet (20 mg total) by mouth daily. 90 tablet 3   Current Facility-Administered Medications  Medication Dose Route Frequency Provider Last Rate Last Admin   0.9 %  sodium chloride  infusion  500 mL Intravenous Once Nandigam, Kavitha V, MD        Allergies as of 07/25/2024 - Review  Complete 05/01/2024  Allergen Reaction Noted   Contrast media [iodinated contrast media] Hives and Shortness Of Breath 01/13/2014   Actonel [risedronate sodium] Other (See Comments) 12/03/2011   Starch Rash 01/01/2014    Family History  Problem Relation Age of Onset   Heart failure Mother 85   Diabetes Mother    Parkinsonism Father 3   Stroke Father    Thyroid  disease Sister    Stomach cancer Paternal Grandfather    Prostate cancer Brother    Colon polyps Neg Hx    Crohn's disease Neg Hx    Esophageal cancer Neg Hx    Rectal cancer Neg Hx    Colon cancer Neg Hx    BRCA 1/2 Neg Hx    Breast cancer Neg Hx     Social History   Socioeconomic History   Marital status: Married    Spouse name: Not on file   Number of children: 5   Years of education: Not on file   Highest education level: Not on file  Occupational History   Occupation: homemaker  Tobacco Use   Smoking status: Never   Smokeless tobacco: Never  Vaping Use   Vaping  status: Never Used  Substance and Sexual Activity   Alcohol use: Not Currently    Comment: 04/24/2018 drank a few beers in college   Drug use: Never   Sexual activity: Not Currently  Other Topics Concern   Not on file  Social History Narrative   Not on file   Social Drivers of Health   Tobacco Use: Low Risk (05/01/2024)   Patient History    Smoking Tobacco Use: Never    Smokeless Tobacco Use: Never    Passive Exposure: Not on file  Financial Resource Strain: Low Risk (06/08/2023)   Overall Financial Resource Strain (CARDIA)    Difficulty of Paying Living Expenses: Not hard at all  Food Insecurity: No Food Insecurity (06/08/2023)   Hunger Vital Sign    Worried About Running Out of Food in the Last Year: Never true    Ran Out of Food in the Last Year: Never true  Transportation Needs: No Transportation Needs (06/08/2023)   PRAPARE - Administrator, Civil Service (Medical): No    Lack of Transportation (Non-Medical): No  Physical Activity: Inactive (06/08/2023)   Exercise Vital Sign    Days of Exercise per Week: 0 days    Minutes of Exercise per Session: 0 min  Stress: No Stress Concern Present (06/08/2023)   [employer] of Occupational Health - Occupational Stress Questionnaire    Feeling of Stress : Not at all  Social Connections: Moderately Integrated (06/08/2023)   Social Connection and Isolation Panel    Frequency of Communication with Friends and Family: More than three times a week    Frequency of Social Gatherings with Friends and Family: Three times a week    Attends Religious Services: More than 4 times per year    Active Member of Clubs or Organizations: No    Attends Banker Meetings: Never    Marital Status: Married  Catering Manager Violence: Not At Risk (06/08/2023)   Humiliation, Afraid, Rape, and Kick questionnaire    Fear of Current or Ex-Partner: No    Emotionally Abused: No    Physically Abused: No    Sexually Abused: No   Depression (PHQ2-9): Low Risk (04/16/2024)   Depression (PHQ2-9)    PHQ-2 Score: 0  Alcohol Screen: Low Risk (06/08/2023)   Alcohol Screen  Last Alcohol Screening Score (AUDIT): 0  Housing: Low Risk (06/08/2023)   Housing    Last Housing Risk Score: 0  Utilities: Not At Risk (06/08/2023)   AHC Utilities    Threatened with loss of utilities: No  Health Literacy: Adequate Health Literacy (06/08/2023)   B1300 Health Literacy    Frequency of need for help with medical instructions: Never    Review of Systems:    Constitutional: No weight loss, fever or chills Cardiovascular: No chest pain, chest pressure or palpitations   Respiratory: No SOB  Gastrointestinal: See HPI and otherwise negative   Physical Exam:  Vital signs: BP 119/66   Pulse 74   Ht 5' 6 (1.676 m)   Wt 164 lb (74.4 kg)   BMI 26.47 kg/m   Constitutional:   Pleasant elderly Caucasian female appears to be in NAD, Well developed, Well nourished, alert and cooperative Respiratory: Respirations even and unlabored. Lungs clear to auscultation bilaterally.   No wheezes, crackles, or rhonchi.  Cardiovascular: Normal S1, S2. No MRG. Regular rate and rhythm. No peripheral edema, cyanosis or pallor.  Gastrointestinal:  Soft, nondistended, nontender. No rebound or guarding. Normal bowel sounds. No appreciable masses or hepatomegaly. Rectal:  Not performed.  Psychiatric: Demonstrates good judgement and reason without abnormal affect or behaviors.  MOST RECENT LABS AND IMAGING: CBC    Component Value Date/Time   WBC 5.0 04/16/2024 1101   RBC 3.84 (L) 04/16/2024 1101   HGB 11.9 (L) 04/16/2024 1101   HGB 11.7 11/04/2022 1607   HGB 12.2 01/01/2014 0838   HCT 36.2 04/16/2024 1101   HCT 34.5 11/04/2022 1607   HCT 37.2 01/01/2014 0838   PLT 200.0 04/16/2024 1101   PLT 213 11/04/2022 1607   MCV 94.5 04/16/2024 1101   MCV 91 11/04/2022 1607   MCV 90.7 01/01/2014 0838   MCH 30.9 11/04/2022 1607   MCH 30.4 04/25/2018 0346    MCHC 32.9 04/16/2024 1101   RDW 13.4 04/16/2024 1101   RDW 12.0 11/04/2022 1607   RDW 12.9 01/01/2014 0838   LYMPHSABS 1.5 04/16/2024 1101   LYMPHSABS 2.1 11/04/2022 1607   LYMPHSABS 1.4 01/01/2014 0838   MONOABS 0.3 04/16/2024 1101   MONOABS 0.4 01/01/2014 0838   EOSABS 0.2 04/16/2024 1101   EOSABS 0.3 11/04/2022 1607   BASOSABS 0.1 04/16/2024 1101   BASOSABS 0.1 11/04/2022 1607   BASOSABS 0.1 01/01/2014 0838    CMP     Component Value Date/Time   NA 141 04/16/2024 1101   NA 142 09/08/2020 1515   NA 141 09/08/2016 1007   K 4.2 04/16/2024 1101   K 4.3 09/08/2016 1007   CL 105 04/16/2024 1101   CO2 29 04/16/2024 1101   CO2 26 09/08/2016 1007   GLUCOSE 90 04/16/2024 1101   GLUCOSE 104 09/08/2016 1007   BUN 11 04/16/2024 1101   BUN 15 09/08/2020 1515   BUN 12.4 09/08/2016 1007   CREATININE 0.73 04/16/2024 1101   CREATININE 0.70 07/07/2020 1024   CREATININE 0.8 09/08/2016 1007   CALCIUM  9.1 04/16/2024 1101   CALCIUM  9.1 09/08/2016 1007   PROT 6.5 04/16/2024 1101   PROT 6.6 09/08/2016 1007   ALBUMIN 4.2 04/16/2024 1101   ALBUMIN 3.8 09/08/2016 1007   AST 37 04/16/2024 1101   AST 18 09/08/2016 1007   ALT 21 04/16/2024 1101   ALT 17 09/08/2016 1007   ALKPHOS 39 04/16/2024 1101   ALKPHOS 35 (L) 09/08/2016 1007   BILITOT 0.5 04/16/2024 1101  BILITOT 0.43 09/08/2016 1007   GFRNONAA 71 09/08/2020 1515   GFRAA 81 09/08/2020 1515     Assessment & Plan Gastroesophageal reflux disease Chronic, well-controlled gastroesophageal reflux disease without current symptoms. Recent endoscopy and biopsies showed no evidence of Barrett's esophagus on EGD 02/2023. - Reduced omeprazole  dose from 40 mg to 20 mg daily. - Sent prescription for Omeprazole  20 mg with a 90-day supply to her pharmacy. - Instructed her to monitor for recurrence of heartburn, reflux, dysphagia, or bloating and to report if symptoms recur. - Discussed in depth history of possible Barrett's esophagus.  This  has been addressed multiple times, initially by Dr. Shila back in 2020, we cannot find a sign of Barrett's esophagus on any biopsies done at [facility] and since she has had endoscopies here there has been either, patient remains worried about this though.  Hiatal hernia Small hiatal hernia identified on recent endoscopy, asymptomatic and without complications.  History of tubular adenoma of colon Single 6 mm precancerous tubular adenoma; no malignancy identified. Surveillance interval discussed in context of updated guidelines, age, and prior polyp history. - Reviewed current guidelines indicating a 7-year surveillance interval for a single small tubular adenoma, but noted that prior polyp history may affect timing. - Planned repeat colonoscopy in 5 years, with reassessment based on age and her preference at that time. - Documented her preference for at least one additional surveillance colonoscopy after age 77 due to family history and personal values.  Patient to follow in clinic with us  in a year for further refills or sooner if necessary.     Delon Failing, PA-C [facility] 07/24/2024, 1:39 PM  Cc: [name], Wanda K, MD  "

## 2024-07-25 ENCOUNTER — Encounter: Payer: Self-pay | Admitting: Physician Assistant

## 2024-07-25 ENCOUNTER — Ambulatory Visit (INDEPENDENT_AMBULATORY_CARE_PROVIDER_SITE_OTHER): Admitting: Physician Assistant

## 2024-07-25 VITALS — BP 119/66 | HR 74 | Ht 66.0 in | Wt 164.0 lb

## 2024-07-25 DIAGNOSIS — K219 Gastro-esophageal reflux disease without esophagitis: Secondary | ICD-10-CM | POA: Diagnosis not present

## 2024-07-25 DIAGNOSIS — K449 Diaphragmatic hernia without obstruction or gangrene: Secondary | ICD-10-CM | POA: Diagnosis not present

## 2024-07-25 DIAGNOSIS — Z860101 Personal history of adenomatous and serrated colon polyps: Secondary | ICD-10-CM

## 2024-07-25 MED ORDER — OMEPRAZOLE 20 MG PO CPDR: 20.0000 mg | DELAYED_RELEASE_CAPSULE | Freq: Every day | ORAL | 3 refills | Status: AC

## 2024-07-25 NOTE — Patient Instructions (Signed)
 We have sent the following medications to your pharmacy for you to pick up at your convenience: Omeprazole    Follow-up 1 year. Sooner if needed.   _______________________________________________________  If your blood pressure at your visit was 140/90 or greater, please contact your primary care physician to follow up on this.  _______________________________________________________  If you are age 75 or older, your body mass index should be between 23-30. Your Body mass index is 26.47 kg/m. If this is out of the aforementioned range listed, please consider follow up with your Primary Care Provider.  If you are age 30 or younger, your body mass index should be between 19-25. Your Body mass index is 26.47 kg/m. If this is out of the aformentioned range listed, please consider follow up with your Primary Care Provider.   ________________________________________________________  The [facility] providers would like to encourage you to use MYCHART to communicate with providers for non-urgent requests or questions.  Due to long hold times on the telephone, sending your provider a message by [facility] may be a faster and more efficient way to get a response.  Please allow 48 business hours for a response.  Please remember that this is for non-urgent requests.  _______________________________________________________  [facility] is using a team-based approach to care.  Your team is made up of your doctor and two to three APPS. Our APPS (Nurse Practitioners and Physician Assistants) work with your physician to ensure care continuity for you. They are fully qualified to address your health concerns and develop a treatment plan. They communicate directly with your gastroenterologist to care for you. Seeing the Advanced Practice Practitioners on your physician's team can help you by facilitating care more promptly, often allowing for earlier appointments, access to diagnostic testing,  procedures, and other specialty referrals.   Thank you for choosing me and [facility].  Delon Failing, PA-C

## 2024-07-29 ENCOUNTER — Ambulatory Visit

## 2024-07-29 VITALS — BP 120/60 | HR 68 | Temp 98.6°F | Ht 66.0 in | Wt 159.8 lb

## 2024-07-29 DIAGNOSIS — Z0000 Encounter for general adult medical examination without abnormal findings: Secondary | ICD-10-CM | POA: Diagnosis not present

## 2024-07-29 NOTE — Progress Notes (Signed)
 "  Chief Complaint  Patient presents with   Medicare Wellness     Subjective:   Donna Manning is a 74 y.o. female who presents for a Medicare Annual Wellness Visit.  Visit info / Clinical Intake: Medicare Wellness Visit Type:: Subsequent Annual Wellness Visit Persons participating in visit and providing information:: patient Medicare Wellness Visit Mode:: In-person (required for WTM) Interpreter Needed?: No Pre-visit prep was completed: yes AWV questionnaire completed by patient prior to visit?: no Living arrangements:: lives with spouse/significant other Patient's Overall Health Status Rating: good Typical amount of pain: none Does pain affect daily life?: no Are you currently prescribed opioids?: no  Dietary Habits and Nutritional Risks How many meals a day?: 2 Eats fruit and vegetables daily?: yes Most meals are obtained by: preparing own meals In the last 2 weeks, have you had any of the following?: none Diabetic:: no  Functional Status Activities of Daily Living (to include ambulation/medication): Independent Ambulation: Independent with device- listed below Home Assistive Devices/Equipment: Eyeglasses Medication Administration: Independent Home Management (perform basic housework or laundry): Independent Manage your own finances?: yes Primary transportation is: driving Concerns about vision?: no *vision screening is required for WTM* Concerns about hearing?: no  Fall Screening Falls in the past year?: 0 Number of falls in past year: 0 Was there an injury with Fall?: 0 Fall Risk Category Calculator: 0 Patient Fall Risk Level: Low Fall Risk  Fall Risk Patient at Risk for Falls Due to: No Fall Risks Fall risk Follow up: Falls evaluation completed  Home and Transportation Safety: All rugs have non-skid backing?: yes All stairs or steps have railings?: yes Grab bars in the bathtub or shower?: yes Have non-skid surface in bathtub or shower?: yes Good home  lighting?: yes Regular seat belt use?: yes Hospital stays in the last year:: no  Cognitive Assessment Difficulty concentrating, remembering, or making decisions? : no Will 6CIT or Mini Cog be Completed: yes What year is it?: 0 points What month is it?: 0 points Give patient an address phrase to remember (5 components): [address] [place]  About what time is it?: 0 points Count backwards from 20 to 1: 0 points Say the months of the year in reverse: 0 points Repeat the address phrase from earlier: 0 points 6 CIT Score: 0 points  Advance Directives (For Healthcare) Does Patient Have a Medical Advance Directive?: Yes Does patient want to make changes to medical advance directive?: No - Patient declined Type of Advance Directive: Healthcare Power of [place]; Living will Copy of Healthcare Power of Attorney in Chart?: No - copy requested Copy of Living Will in Chart?: No - copy requested  Reviewed/Updated  Reviewed/Updated: Reviewed All (Medical, Surgical, Family, Medications, Allergies, Care Teams, Patient Goals)    Allergies (verified) Contrast media [iodinated contrast media], Actonel [risedronate sodium], and Starch   Current Medications (verified) Outpatient Encounter Medications as of 07/29/2024  Medication Sig   baclofen  (LIORESAL ) 10 MG tablet TAKE 1 TABLET BY MOUTH AT BEDTIME AS NEEDED FOR MUSCLE SPASMS   Calcium  Carbonate-Vit D-Min (CALCIUM  1200 PO) Take by mouth daily at 6 (six) AM.   cetirizine (ZYRTEC) 5 MG tablet Take by mouth as needed for allergies.   Cholecalciferol (VITAMIN D ) 50 MCG (2000 UT) CAPS Take by mouth daily at 6 (six) AM.   levothyroxine  (SYNTHROID ) 88 MCG tablet TAKE 1 TABLET BY MOUTH EVERY DAY   MAGNESIUM PO Take by mouth daily.   Multiple Vitamins-Minerals (MULTIVITAMIN ADULTS 50+ PO) Take 1 tablet by  mouth daily.   nystatin  cream (MYCOSTATIN ) Apply 1 Application topically 2 (two) times daily.   tamoxifen  (NOLVADEX ) 20 MG tablet Take 1  tablet (20 mg total) by mouth daily.   omeprazole  (PRILOSEC) 20 MG capsule Take 1 capsule (20 mg total) by mouth daily.   rosuvastatin  (CRESTOR ) 10 MG tablet TAKE 1 TABLET BY MOUTH EVERY DAY (Patient not taking: Reported on 07/25/2024)   Facility-Administered Encounter Medications as of 07/29/2024  Medication   0.9 %  sodium chloride  infusion    History: Past Medical History:  Diagnosis Date   Atypical lobular hyperplasia (ALH) of left breast 03/2018   Barrett's esophagus 2009   Benign head tremor    Breast cancer (HCC)    Breast cancer, right breast (HCC) 01/2014   had 5 weeks radiation after lumpectomy (04/24/2018)   Dental bridge present    lower left   Dental crowns present    GERD (gastroesophageal reflux disease)    History of angina    History of DVT (deep vein thrombosis) ~ 1980   after a pregnancy   History of radiation therapy 2015   Hyperlipidemia    Hypothyroidism    Irregular heart beat    dx'd by 1 dr; not noted by the cardiologist (04/24/2018)   NSAID-induced gastric ulcer    Osteoporosis    Personal history of radiation therapy    PONV (postoperative nausea and vomiting)    Past Surgical History:  Procedure Laterality Date   BREAST BIOPSY Right 1991   BREAST EXCISIONAL BIOPSY Left 2019   BREAST LUMPECTOMY Right 2015   BREAST LUMPECTOMY W/ NEEDLE LOCALIZATION Left 04/24/2018   BREAST LUMPECTOMY WITH NEEDLE LOCALIZATION Left 04/24/2018   Procedure: LEFT BREAST LUMPECTOMY WITH NEEDLE LOCALIZATION;  Surgeon: Vanderbilt Ned, MD;  Location: [facility];  Service: General;  Laterality: Left;   COLONOSCOPY     w/hemorrhoid banding   COLOSTOMY  2009   PARTIAL MASTECTOMY WITH NEEDLE LOCALIZATION AND AXILLARY SENTINEL LYMPH NODE BX Right 01/20/2014   Procedure: RIGHT PARTIAL MASTECTOMY WITH DOUBLE  NEEDLE LOCALIZATION (BRACKETED )AND AXILLARY SENTINEL LYMPH NODE BX;  Surgeon: Elon CHRISTELLA Pacini, MD;  Location: [facility];  Service:  General;  Laterality: Right;  And [place].   RE-EXCISION OF BREAST CANCER,SUPERIOR MARGINS Right 02/05/2014   Procedure: RIGHT LUMPECTOMY, RE-EXCISION OF BREAST CANCER,SUPERIOR MARGINS;  Surgeon: Elon CHRISTELLA Pacini, MD;  Location: [facility];  Service: General;  Laterality: Right;   UPPER GASTROINTESTINAL ENDOSCOPY  2016   UPPER GI ENDOSCOPY  12/25/2014   with Propofol    Family History  Problem Relation Age of Onset   Heart failure Mother 38   Diabetes Mother    Parkinsonism Father 81   Stroke Father    Thyroid  disease Sister    Stomach cancer Paternal Grandfather    Prostate cancer Brother    Colon polyps Neg Hx    Crohn's disease Neg Hx    Esophageal cancer Neg Hx    Rectal cancer Neg Hx    Colon cancer Neg Hx    BRCA 1/2 Neg Hx    Breast cancer Neg Hx    Social History   Occupational History   Occupation: homemaker  Tobacco Use   Smoking status: Never   Smokeless tobacco: Never  Vaping Use   Vaping status: Never Used  Substance and Sexual Activity   Alcohol use: Not Currently    Comment: 04/24/2018 drank a few beers in college   Drug use: Never  Sexual activity: Not Currently   Tobacco Counseling Counseling given: No  SDOH Screenings   Food Insecurity: No Food Insecurity (07/29/2024)  Housing: Low Risk (07/29/2024)  Transportation Needs: No Transportation Needs (07/29/2024)  Utilities: Not At Risk (07/29/2024)  Alcohol Screen: Low Risk (06/08/2023)  Depression (PHQ2-9): Low Risk (07/29/2024)  Financial Resource Strain: Low Risk (06/08/2023)  Physical Activity: Inactive (07/29/2024)  Social Connections: Socially Integrated (07/29/2024)  Stress: No Stress Concern Present (07/29/2024)  Tobacco Use: Low Risk (07/29/2024)  Health Literacy: Adequate Health Literacy (07/29/2024)   See flowsheets for full screening details  Depression Screen PHQ 2 & 9 Depression Scale- Over the past 2 weeks, how often have you been bothered by any of the following  problems? Little interest or pleasure in doing things: 0 Feeling down, depressed, or hopeless (PHQ Adolescent also includes...irritable): 0 PHQ-2 Total Score: 0     Goals Addressed               This Visit's Progress     Patient Stated (pt-stated)        Stay healthy.             Objective:    Today's Vitals   07/29/24 0918  BP: 120/60  Pulse: 68  Temp: 98.6 F (37 C)  TempSrc: Oral  SpO2: 97%  Weight: 159 lb 12.8 oz (72.5 kg)  Height: 5' 6 (1.676 m)   Body mass index is 25.79 kg/m.  Hearing/Vision screen Hearing Screening - Comments:: Denies hearing difficulties   Vision Screening - Comments:: Wears rx glasses - up to date with routine eye exams with  [facility] Immunizations and Health Maintenance Health Maintenance  Topic Date Due   DTaP/Tdap/Td (2 - Td or Tdap) 11/29/2021   COVID-19 Vaccine (8 - Moderna risk 2025-26 season) 10/06/2024   Mammogram  07/15/2025   Medicare Annual Wellness (AWV)  07/29/2025   Colonoscopy  03/08/2028   Pneumococcal Vaccine: 50+ Years  Completed   Influenza Vaccine  Completed   Bone Density Scan  Completed   Hepatitis C Screening  Completed   Zoster Vaccines- Shingrix  Completed   Meningococcal B Vaccine  Aged Out        Assessment/Plan:  This is a routine wellness examination for Dakiyah.  Patient Care Team: [name], Apolinar POUR, MD as PCP - General Okey Vina GAILS, MD as PCP - Cardiology (Cardiology) Kandyce Sor, MD as Attending Physician (Obstetrics and Gynecology) Robinson Idol, MD (Ophthalmology) Keenan Hastings, MD as Consulting Physician (Radiation Oncology) Shila Gustav GAILS, MD as Consulting Physician (Gastroenterology) Odean Potts, MD as Consulting Physician (Hematology and Oncology)  I have personally reviewed and noted the following in the patients chart:   Medical and social history Use of alcohol, tobacco or illicit drugs  Current medications and supplements including opioid  prescriptions. Functional ability and status Nutritional status Physical activity Advanced directives List of other physicians Hospitalizations, surgeries, and ER visits in previous 12 months Vitals Screenings to include cognitive, depression, and falls Referrals and appointments  No orders of the defined types were placed in this encounter.  In addition, I have reviewed and discussed with patient certain preventive protocols, quality metrics, and best practice recommendations. A written personalized care plan for preventive services as well as general preventive health recommendations were provided to patient.   Rojelio LELON Blush, LPN   8/87/7973   Return in 1 year (on 08/01/2025).  After Visit Summary: (In Person-Declined) Patient declined AVS at this time.  Nurse Notes: No voiced or  noted concerns at this time "

## 2024-07-29 NOTE — Patient Instructions (Addendum)
 Donna Manning,  Thank you for taking the time for your Medicare Wellness Visit. I appreciate your continued commitment to your health goals. Please review the care plan we discussed, and feel free to reach out if I can assist you further.  Please note that Annual Wellness Visits do not include a physical exam. Some assessments may be limited, especially if the visit was conducted virtually. If needed, we may recommend an in-person follow-up with your provider.  Ongoing Care Seeing your primary care provider every 3 to 6 months helps us  monitor your health and provide consistent, personalized care.   Referrals If a referral was made during today's visit and you haven't received any updates within two weeks, please contact the referred provider directly to check on the status.  Recommended Screenings:  Health Maintenance  Topic Date Due   DTaP/Tdap/Td vaccine (2 - Td or Tdap) 11/29/2021   COVID-19 Vaccine (8 - Moderna risk 2025-26 season) 10/06/2024   Breast Cancer Screening  07/15/2025   Medicare Annual Wellness Visit  07/29/2025   Colon Cancer Screening  03/08/2028   Pneumococcal Vaccine for age over 80  Completed   Flu Shot  Completed   Osteoporosis screening with Bone Density Scan  Completed   Hepatitis C Screening  Completed   Zoster (Shingles) Vaccine  Completed   Meningitis B Vaccine  Aged Out       07/29/2024    9:31 AM  Advanced Directives  Does Patient Have a Medical Advance Directive? Yes  Type of Estate Agent of [place];Living will  Does patient want to make changes to medical advance directive? No - Patient declined  Copy of Healthcare Power of Attorney in Chart? No - copy requested    Vision: Annual vision screenings are recommended for early detection of glaucoma, cataracts, and diabetic retinopathy. These exams can also reveal signs of chronic conditions such as diabetes and high blood pressure.  Dental: Annual dental screenings help detect  early signs of oral cancer, gum disease, and other conditions linked to overall health, including heart disease and diabetes.  Please see the attached documents for additional preventive care recommendations.

## 2024-08-02 ENCOUNTER — Other Ambulatory Visit: Payer: Self-pay | Admitting: Internal Medicine

## 2024-08-15 ENCOUNTER — Ambulatory Visit: Admitting: Internal Medicine

## 2024-08-15 ENCOUNTER — Encounter: Payer: Self-pay | Admitting: Internal Medicine

## 2024-08-15 VITALS — BP 100/68 | HR 71 | Temp 98.0°F | Ht 66.46 in | Wt 158.8 lb

## 2024-08-15 DIAGNOSIS — C50411 Malignant neoplasm of upper-outer quadrant of right female breast: Secondary | ICD-10-CM | POA: Diagnosis not present

## 2024-08-15 DIAGNOSIS — E785 Hyperlipidemia, unspecified: Secondary | ICD-10-CM

## 2024-08-15 DIAGNOSIS — Z170 Estrogen receptor positive status [ER+]: Secondary | ICD-10-CM

## 2024-08-15 DIAGNOSIS — R5383 Other fatigue: Secondary | ICD-10-CM

## 2024-08-15 DIAGNOSIS — R739 Hyperglycemia, unspecified: Secondary | ICD-10-CM | POA: Diagnosis not present

## 2024-08-15 DIAGNOSIS — M545 Low back pain, unspecified: Secondary | ICD-10-CM

## 2024-08-15 DIAGNOSIS — F439 Reaction to severe stress, unspecified: Secondary | ICD-10-CM | POA: Diagnosis not present

## 2024-08-15 DIAGNOSIS — R350 Frequency of micturition: Secondary | ICD-10-CM | POA: Diagnosis not present

## 2024-08-15 DIAGNOSIS — R634 Abnormal weight loss: Secondary | ICD-10-CM | POA: Diagnosis not present

## 2024-08-15 DIAGNOSIS — M791 Myalgia, unspecified site: Secondary | ICD-10-CM | POA: Diagnosis not present

## 2024-08-15 LAB — BASIC METABOLIC PANEL WITH GFR
BUN: 12 mg/dL (ref 6–23)
CO2: 30 meq/L (ref 19–32)
Calcium: 9.1 mg/dL (ref 8.4–10.5)
Chloride: 104 meq/L (ref 96–112)
Creatinine, Ser: 0.76 mg/dL (ref 0.40–1.20)
GFR: 76.94 mL/min
Glucose, Bld: 101 mg/dL — ABNORMAL HIGH (ref 70–99)
Potassium: 4.1 meq/L (ref 3.5–5.1)
Sodium: 138 meq/L (ref 135–145)

## 2024-08-15 LAB — POCT URINALYSIS DIPSTICK
Bilirubin, UA: POSITIVE
Blood, UA: NEGATIVE
Glucose, UA: NEGATIVE
Ketones, UA: NEGATIVE
Nitrite, UA: NEGATIVE
Protein, UA: POSITIVE — AB
Urobilinogen, UA: 0.2 U/dL
pH, UA: 5.5

## 2024-08-15 LAB — C-REACTIVE PROTEIN: CRP: <0.5 mg/dL — ABNORMAL LOW (ref 1.0–20.0)

## 2024-08-15 LAB — LIPID PANEL
Cholesterol: 171 mg/dL (ref 28–200)
HDL: 55.7 mg/dL
LDL Cholesterol: 90 mg/dL (ref 10–99)
NonHDL: 115.22
Total CHOL/HDL Ratio: 3
Triglycerides: 125 mg/dL (ref 10.0–149.0)
VLDL: 25 mg/dL (ref 0.0–40.0)

## 2024-08-15 LAB — HEMOGLOBIN A1C: Hgb A1c MFr Bld: 6.1 % (ref 4.6–6.5)

## 2024-08-15 MED ORDER — CEFUROXIME AXETIL 250 MG PO TABS: 250.0000 mg | ORAL_TABLET | Freq: Two times a day (BID) | ORAL | 0 refills | Status: AC
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
## 2024-08-15 NOTE — Patient Instructions (Addendum)
 Urine culture pending  but  can take antibiotic to  be sent in in case esp with weather etc .  Checking lipid bmet  blood count  A1c  today   as update and will share with  cards team .  Fu depending   hope you get to go on your trip.

## 2024-08-16 LAB — URINE CULTURE
MICRO NUMBER:: 17526732
Result:: NO GROWTH
SPECIMEN QUALITY:: ADEQUATE

## 2024-08-22 ENCOUNTER — Ambulatory Visit: Payer: Self-pay | Admitting: Internal Medicine

## 2024-08-22 NOTE — Progress Notes (Signed)
 Urine culture shows no bacteria  and thus no documented UTI Inflammation markers are not elevated  no diabetes but A1c in prediabetic range  No further explanation for  weight loss  Sharing with cards oncology team ( off the statin)  Fu with team  if  persistent  progressive sx

## 2024-09-03 ENCOUNTER — Inpatient Hospital Stay: Admitting: Hematology and Oncology

## 2025-08-01 ENCOUNTER — Ambulatory Visit
